# Patient Record
Sex: Female | Born: 1960 | Race: White | Hispanic: No | Smoking: Never smoker
Health system: Southern US, Community
[De-identification: ages and names within clinical notes are randomized; demographics above are authoritative.]

## PROBLEM LIST (undated history)

## (undated) DIAGNOSIS — Z923 Personal history of irradiation: Secondary | ICD-10-CM

## (undated) DIAGNOSIS — B019 Varicella without complication: Secondary | ICD-10-CM

## (undated) HISTORY — PX: APPENDECTOMY: SHX54

## (undated) HISTORY — PX: CHOLECYSTECTOMY: SHX55

## (undated) HISTORY — DX: Personal history of irradiation: Z92.3

---

## 2016-05-27 LAB — HM PAP SMEAR

## 2017-06-16 LAB — HM COLONOSCOPY

## 2020-05-13 DIAGNOSIS — M25461 Effusion, right knee: Secondary | ICD-10-CM | POA: Diagnosis not present

## 2020-05-13 DIAGNOSIS — M2241 Chondromalacia patellae, right knee: Secondary | ICD-10-CM | POA: Diagnosis not present

## 2020-10-18 ENCOUNTER — Emergency Department (HOSPITAL_COMMUNITY): Payer: BC Managed Care – PPO

## 2020-10-18 ENCOUNTER — Other Ambulatory Visit: Payer: Self-pay

## 2020-10-18 ENCOUNTER — Inpatient Hospital Stay (HOSPITAL_COMMUNITY)
Admission: EM | Admit: 2020-10-18 | Discharge: 2020-10-21 | DRG: 543 | Disposition: A | Discharge status: Home / Self Care | Payer: BC Managed Care – PPO | Attending: Family Medicine | Admitting: Family Medicine

## 2020-10-18 DIAGNOSIS — W109XXA Fall (on) (from) unspecified stairs and steps, initial encounter: Secondary | ICD-10-CM | POA: Diagnosis present

## 2020-10-18 DIAGNOSIS — R42 Dizziness and giddiness: Secondary | ICD-10-CM | POA: Diagnosis not present

## 2020-10-18 DIAGNOSIS — D509 Iron deficiency anemia, unspecified: Secondary | ICD-10-CM | POA: Diagnosis present

## 2020-10-18 DIAGNOSIS — Z9049 Acquired absence of other specified parts of digestive tract: Secondary | ICD-10-CM | POA: Diagnosis not present

## 2020-10-18 DIAGNOSIS — Z8249 Family history of ischemic heart disease and other diseases of the circulatory system: Secondary | ICD-10-CM

## 2020-10-18 DIAGNOSIS — C7951 Secondary malignant neoplasm of bone: Secondary | ICD-10-CM | POA: Diagnosis not present

## 2020-10-18 DIAGNOSIS — H5509 Other forms of nystagmus: Secondary | ICD-10-CM | POA: Diagnosis not present

## 2020-10-18 DIAGNOSIS — Y9301 Activity, walking, marching and hiking: Secondary | ICD-10-CM | POA: Diagnosis present

## 2020-10-18 DIAGNOSIS — M899 Disorder of bone, unspecified: Secondary | ICD-10-CM

## 2020-10-18 DIAGNOSIS — Z85038 Personal history of other malignant neoplasm of large intestine: Secondary | ICD-10-CM | POA: Diagnosis not present

## 2020-10-18 DIAGNOSIS — C7989 Secondary malignant neoplasm of other specified sites: Secondary | ICD-10-CM | POA: Diagnosis not present

## 2020-10-18 DIAGNOSIS — N83202 Unspecified ovarian cyst, left side: Secondary | ICD-10-CM | POA: Diagnosis not present

## 2020-10-18 DIAGNOSIS — I10 Essential (primary) hypertension: Secondary | ICD-10-CM | POA: Diagnosis not present

## 2020-10-18 DIAGNOSIS — M5136 Other intervertebral disc degeneration, lumbar region: Secondary | ICD-10-CM | POA: Diagnosis not present

## 2020-10-18 DIAGNOSIS — M25552 Pain in left hip: Secondary | ICD-10-CM | POA: Diagnosis not present

## 2020-10-18 DIAGNOSIS — H8302 Labyrinthitis, left ear: Secondary | ICD-10-CM | POA: Diagnosis not present

## 2020-10-18 DIAGNOSIS — Z7189 Other specified counseling: Secondary | ICD-10-CM

## 2020-10-18 DIAGNOSIS — N6310 Unspecified lump in the right breast, unspecified quadrant: Secondary | ICD-10-CM

## 2020-10-18 DIAGNOSIS — M4802 Spinal stenosis, cervical region: Secondary | ICD-10-CM | POA: Diagnosis not present

## 2020-10-18 DIAGNOSIS — N6341 Unspecified lump in right breast, subareolar: Secondary | ICD-10-CM | POA: Diagnosis not present

## 2020-10-18 DIAGNOSIS — Z20822 Contact with and (suspected) exposure to covid-19: Secondary | ICD-10-CM | POA: Diagnosis present

## 2020-10-18 DIAGNOSIS — E876 Hypokalemia: Secondary | ICD-10-CM | POA: Diagnosis not present

## 2020-10-18 DIAGNOSIS — Z853 Personal history of malignant neoplasm of breast: Secondary | ICD-10-CM | POA: Diagnosis not present

## 2020-10-18 DIAGNOSIS — H748X3 Other specified disorders of middle ear and mastoid, bilateral: Secondary | ICD-10-CM | POA: Diagnosis not present

## 2020-10-18 DIAGNOSIS — Z803 Family history of malignant neoplasm of breast: Secondary | ICD-10-CM

## 2020-10-18 DIAGNOSIS — H8309 Labyrinthitis, unspecified ear: Secondary | ICD-10-CM | POA: Diagnosis not present

## 2020-10-18 DIAGNOSIS — R9389 Abnormal findings on diagnostic imaging of other specified body structures: Secondary | ICD-10-CM

## 2020-10-18 DIAGNOSIS — J90 Pleural effusion, not elsewhere classified: Secondary | ICD-10-CM | POA: Diagnosis not present

## 2020-10-18 DIAGNOSIS — D72829 Elevated white blood cell count, unspecified: Secondary | ICD-10-CM | POA: Diagnosis present

## 2020-10-18 DIAGNOSIS — Y92009 Unspecified place in unspecified non-institutional (private) residence as the place of occurrence of the external cause: Secondary | ICD-10-CM | POA: Diagnosis not present

## 2020-10-18 DIAGNOSIS — I6782 Cerebral ischemia: Secondary | ICD-10-CM | POA: Diagnosis not present

## 2020-10-18 DIAGNOSIS — M5134 Other intervertebral disc degeneration, thoracic region: Secondary | ICD-10-CM | POA: Diagnosis not present

## 2020-10-18 DIAGNOSIS — M25551 Pain in right hip: Secondary | ICD-10-CM | POA: Diagnosis not present

## 2020-10-18 DIAGNOSIS — C50919 Malignant neoplasm of unspecified site of unspecified female breast: Secondary | ICD-10-CM | POA: Diagnosis not present

## 2020-10-18 DIAGNOSIS — R918 Other nonspecific abnormal finding of lung field: Secondary | ICD-10-CM | POA: Diagnosis not present

## 2020-10-18 DIAGNOSIS — F419 Anxiety disorder, unspecified: Secondary | ICD-10-CM | POA: Diagnosis not present

## 2020-10-18 DIAGNOSIS — G8929 Other chronic pain: Secondary | ICD-10-CM | POA: Diagnosis not present

## 2020-10-18 DIAGNOSIS — J3489 Other specified disorders of nose and nasal sinuses: Secondary | ICD-10-CM | POA: Diagnosis not present

## 2020-10-18 DIAGNOSIS — N63 Unspecified lump in unspecified breast: Secondary | ICD-10-CM | POA: Diagnosis not present

## 2020-10-18 DIAGNOSIS — Z800 Family history of malignant neoplasm of digestive organs: Secondary | ICD-10-CM | POA: Diagnosis not present

## 2020-10-18 DIAGNOSIS — M50222 Other cervical disc displacement at C5-C6 level: Secondary | ICD-10-CM | POA: Diagnosis not present

## 2020-10-18 DIAGNOSIS — R930 Abnormal findings on diagnostic imaging of skull and head, not elsewhere classified: Secondary | ICD-10-CM | POA: Diagnosis not present

## 2020-10-18 DIAGNOSIS — C7981 Secondary malignant neoplasm of breast: Secondary | ICD-10-CM

## 2020-10-18 DIAGNOSIS — R252 Cramp and spasm: Secondary | ICD-10-CM | POA: Diagnosis not present

## 2020-10-18 DIAGNOSIS — R0781 Pleurodynia: Secondary | ICD-10-CM | POA: Diagnosis not present

## 2020-10-18 DIAGNOSIS — Z79899 Other long term (current) drug therapy: Secondary | ICD-10-CM | POA: Diagnosis not present

## 2020-10-18 DIAGNOSIS — C799 Secondary malignant neoplasm of unspecified site: Secondary | ICD-10-CM | POA: Diagnosis present

## 2020-10-18 DIAGNOSIS — M7989 Other specified soft tissue disorders: Secondary | ICD-10-CM | POA: Diagnosis not present

## 2020-10-18 DIAGNOSIS — M50223 Other cervical disc displacement at C6-C7 level: Secondary | ICD-10-CM | POA: Diagnosis not present

## 2020-10-18 DIAGNOSIS — R609 Edema, unspecified: Secondary | ICD-10-CM | POA: Diagnosis not present

## 2020-10-18 HISTORY — DX: Varicella without complication: B01.9

## 2020-10-18 LAB — TROPONIN I (HIGH SENSITIVITY)
Troponin I (High Sensitivity): 6 ng/L (ref <18)
Troponin I (High Sensitivity): 7 ng/L (ref <18)

## 2020-10-18 LAB — URINALYSIS, ROUTINE W REFLEX MICROSCOPIC
Bilirubin Urine: NEGATIVE
Glucose, UA: NEGATIVE mg/dL
Hgb urine dipstick: NEGATIVE
Ketones, ur: 5 mg/dL — AB
Leukocytes,Ua: NEGATIVE
Nitrite: NEGATIVE
Protein, ur: 100 mg/dL — AB
Specific Gravity, Urine: 1.028 (ref 1.005–1.030)
pH: 5 (ref 5.0–8.0)

## 2020-10-18 LAB — CBC
HCT: 36.2 % (ref 36.0–46.0)
Hemoglobin: 10.9 g/dL — ABNORMAL LOW (ref 12.0–15.0)
MCH: 23.4 pg — ABNORMAL LOW (ref 26.0–34.0)
MCHC: 30.1 g/dL (ref 30.0–36.0)
MCV: 77.7 fL — ABNORMAL LOW (ref 80.0–100.0)
Platelets: 519 10*3/uL — ABNORMAL HIGH (ref 150–400)
RBC: 4.66 MIL/uL (ref 3.87–5.11)
RDW: 16 % — ABNORMAL HIGH (ref 11.5–15.5)
WBC: 15.9 10*3/uL — ABNORMAL HIGH (ref 4.0–10.5)
nRBC: 0.1 % (ref 0.0–0.2)

## 2020-10-18 LAB — BASIC METABOLIC PANEL
Anion gap: 14 (ref 5–15)
BUN: 14 mg/dL (ref 6–20)
CO2: 26 mmol/L (ref 22–32)
Calcium: 10.7 mg/dL — ABNORMAL HIGH (ref 8.9–10.3)
Chloride: 100 mmol/L (ref 98–111)
Creatinine, Ser: 0.94 mg/dL (ref 0.44–1.00)
GFR, Estimated: >60 mL/min (ref >60)
Glucose, Bld: 122 mg/dL — ABNORMAL HIGH (ref 70–99)
Potassium: 3.4 mmol/L — ABNORMAL LOW (ref 3.5–5.1)
Sodium: 140 mmol/L (ref 135–145)

## 2020-10-18 LAB — RESP PANEL BY RT-PCR (FLU A&B, COVID) ARPGX2
Influenza A by PCR: NEGATIVE
Influenza B by PCR: NEGATIVE
SARS Coronavirus 2 by RT PCR: NEGATIVE

## 2020-10-18 LAB — I-STAT BETA HCG BLOOD, ED (MC, WL, AP ONLY): I-stat hCG, quantitative: <5 m[IU]/mL (ref <5)

## 2020-10-18 MED ORDER — SODIUM CHLORIDE 0.9% FLUSH
3.0000 mL | Freq: Two times a day (BID) | INTRAVENOUS | Status: DC
  Administered 2020-10-19 – 2020-10-21 (×4): 3 mL via INTRAVENOUS

## 2020-10-18 MED ORDER — LORATADINE 10 MG PO TABS
10.0000 mg | ORAL_TABLET | Freq: Two times a day (BID) | ORAL | Status: DC
  Administered 2020-10-19 – 2020-10-21 (×5): 10 mg via ORAL
  Filled 2020-10-18 (×6): qty 1

## 2020-10-18 MED ORDER — ACETAMINOPHEN 325 MG PO TABS
650.0000 mg | ORAL_TABLET | Freq: Four times a day (QID) | ORAL | Status: DC | PRN
  Administered 2020-10-19 – 2020-10-20 (×4): 650 mg via ORAL
  Filled 2020-10-18 (×4): qty 2

## 2020-10-18 MED ORDER — ENOXAPARIN SODIUM 60 MG/0.6ML ~~LOC~~ SOLN
55.0000 mg | Freq: Every day | SUBCUTANEOUS | Status: DC
  Administered 2020-10-19 – 2020-10-20 (×3): 55 mg via SUBCUTANEOUS
  Filled 2020-10-18 (×3): qty 0.6
  Filled 2020-10-18: qty 0.55

## 2020-10-18 MED ORDER — ZOLPIDEM TARTRATE 5 MG PO TABS: 5.0000 mg | ORAL_TABLET | Freq: Every evening | ORAL | Status: DC | PRN

## 2020-10-18 MED ORDER — KETOROLAC TROMETHAMINE 30 MG/ML IJ SOLN
30.0000 mg | Freq: Four times a day (QID) | INTRAMUSCULAR | Status: DC | PRN
  Administered 2020-10-19: 30 mg via INTRAVENOUS
  Filled 2020-10-18 (×2): qty 1

## 2020-10-18 MED ORDER — ACETAMINOPHEN 650 MG RE SUPP: 650.0000 mg | Freq: Four times a day (QID) | RECTAL | Status: DC | PRN

## 2020-10-18 MED ORDER — LORAZEPAM 2 MG/ML IJ SOLN
0.5000 mg | Freq: Once | INTRAMUSCULAR | Status: AC
  Administered 2020-10-18: 0.5 mg via INTRAVENOUS
  Filled 2020-10-18: qty 1

## 2020-10-18 MED ORDER — ONDANSETRON HCL 4 MG/2ML IJ SOLN
4.0000 mg | Freq: Once | INTRAMUSCULAR | Status: AC
  Administered 2020-10-18: 4 mg via INTRAVENOUS
  Filled 2020-10-18: qty 2

## 2020-10-18 MED ORDER — FENTANYL CITRATE (PF) 100 MCG/2ML IJ SOLN
50.0000 ug | Freq: Once | INTRAMUSCULAR | Status: AC
  Administered 2020-10-18: 50 ug via INTRAVENOUS
  Filled 2020-10-18: qty 2

## 2020-10-18 MED ORDER — MECLIZINE HCL 25 MG PO TABS
25.0000 mg | ORAL_TABLET | Freq: Once | ORAL | Status: AC
  Administered 2020-10-18: 25 mg via ORAL
  Filled 2020-10-18: qty 1

## 2020-10-18 MED ORDER — SODIUM CHLORIDE 0.9% FLUSH: 3.0000 mL | INTRAVENOUS | Status: DC | PRN

## 2020-10-18 MED ORDER — MECLIZINE HCL 25 MG PO TABS
25.0000 mg | ORAL_TABLET | Freq: Three times a day (TID) | ORAL | Status: DC
  Administered 2020-10-19 – 2020-10-21 (×8): 25 mg via ORAL
  Filled 2020-10-18 (×9): qty 1

## 2020-10-18 MED ORDER — SODIUM CHLORIDE 0.9 % IV SOLN
250.0000 mL | INTRAVENOUS | Status: DC | PRN
Start: 2020-10-18 — End: 2020-10-22

## 2020-10-18 MED ORDER — SODIUM CHLORIDE 0.9 % IV BOLUS
1000.0000 mL | Freq: Once | INTRAVENOUS | Status: AC
  Administered 2020-10-18: 1000 mL via INTRAVENOUS

## 2020-10-18 MED ORDER — MORPHINE SULFATE (PF) 4 MG/ML IV SOLN
4.0000 mg | Freq: Once | INTRAVENOUS | Status: AC
  Administered 2020-10-18: 4 mg via INTRAVENOUS
  Filled 2020-10-18: qty 1

## 2020-10-18 NOTE — H&P (Signed)
History and Physical    Annette James ZOX:[id number] DOB: [birth year] DOA: 10/18/2020  PCP: Patient, No Pcp Per (Confirm with patient/family/NH records and if not entered, this has to be entered at [facility] point of entry) Patient coming from: home  I have personally briefly reviewed patient's old medical records in [place]  Chief Complaint: fall, dizziness  HPI: Annette James is a 60 y.o. female with no significant medical history who presents for evaluation of fall, dizziness.  She reports that she fell this morning.  She states she was walking down the steps and fell about 3 steps.  She does not know if she hit her head but did not have any LOC.  She reports that she has been having issues with her left knee and she thought her knee gave out which is what caused her to fall.  She cannot recall if she was dizzy before the fall.  She states that the first time she really remembers that when she bent down to put her shoe on and when she sat back up, she was very dizzy, felt nauseous.  She has had dizziness since then.  She states it is better if she remains still but as soon as she moves her head, sits up, the dizziness returns.  She states she did not have any preceding chest pain.  She states that she has pain noted to her right side of her back, shoulders, hips.  She has been able to ambulate since this happened.  She is not on blood thinners.  She denies any vision changes, numbness/weakness of her arms or legs.  She denies any chest pain, difficulty breathing, abdominal pain.  ED Course: T 98.9  142/84  HR 89  RR 14 ED-PA exam notable for horizontal nystagmus, positional dizziness. Lab with Covid NEGATIVE, Cmet with glucose 122. WBC 15.9, Hgb 10.9. CT head w/o acute abnormality. CT C-spine with multiple lytic lesions. TRH called to admit for further evaluation of lytic spine lesions - r/o malignancy.  Review of Systems: As per HPI otherwise 10 point review of systems negative.    Past  Medical History:  Diagnosis Date  . Chicken pox     Past Surgical History:  Procedure Laterality Date  . APPENDECTOMY    . CESAREAN SECTION     x 4  . CHOLECYSTECTOMY     Soc Hx  - married 51 years, widowed 2014. 4 daughters - two still at home. I grandchild, one on the way. 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 Two daughters live at home.    has no history on file for tobacco use, alcohol use, and drug use.  No Known Allergies  Family History  Problem Relation Age of Onset  . Breast cancer Mother   . Hypertension Mother   . Colon cancer Father   . Hypertension Father      Prior to Admission medications   Medication Sig Start Date End Date Taking? Authorizing Provider  ascorbic acid (VITAMIN C) 500 MG tablet Take 500 mg by mouth daily.   Yes [provider]  Multiple Vitamins-Minerals (ONE-A-DAY WOMENS 50 PLUS) TABS Take 1 tablet by mouth daily.   Yes [provider]    Physical Exam: Vitals:   10/18/20 2240 10/18/20 2245 10/18/20 2300 10/18/20 2315  BP: (!) 151/86 (!) 158/80 (!) 142/84   Pulse: 99 94 90 89  Resp: (!) 21 (!) 23 (!) 23 14  Temp: 98.9 F (37.2 C)     TempSrc:  Oral     SpO2: 96% 91% 91% 91%  Weight:      Height:         Vitals:   10/18/20 2240 10/18/20 2245 10/18/20 2300 10/18/20 2315  BP: (!) 151/86 (!) 158/80 (!) 142/84   Pulse: 99 94 90 89  Resp: (!) 21 (!) 23 (!) 23 14  Temp: 98.9 F (37.2 C)     TempSrc: Oral     SpO2: 96% 91% 91% 91%  Weight:      Height:       General- overweight woman in no distress Eyes: PERRL, lids and conjunctivae normal ENMT: Mucous membranes are moist. Posterior pharynx clear of any exudate or lesions.Normal dentition.  Neck: normal, supple, no masses, no thyromegaly Breast - 3x4 cm firm mobile mass right breast at 10 O'clock position. Respiratory: clear to auscultation bilaterally, no wheezing, no crackles. Normal respiratory effort. No accessory muscle use.  Cardiovascular: Regular  rate and rhythm, no murmurs / rubs / gallops. No extremity edema. 2+ pedal pulses. No carotid bruits.  Abdomen: no tenderness, no masses palpated. No hepatosplenomegaly. Bowel sounds positive.  Musculoskeletal: no clubbing / cyanosis. No joint deformity upper and lower extremities. Good ROM, no contractures. Normal muscle tone.  Skin: no rashes, lesions, ulcers. No induration Neurologic: CN 2-12 grossly intact. Minimal horizontal nystagmus with head turn to the left. Sensation intact. Strength 5/5 in all 4.  Psychiatric: Normal judgment and insight. Alert and oriented x 3. Normal mood.     Labs on Admission: I have personally reviewed following labs and imaging studies  CBC: Recent Labs  Lab 10/18/20 0819  WBC 15.9*  HGB 10.9*  HCT 36.2  MCV 77.7*  PLT 893*   Basic Metabolic Panel: Recent Labs  Lab 10/18/20 0819  NA 140  K 3.4*  CL 100  CO2 26  GLUCOSE 122*  BUN 14  CREATININE 0.94  CALCIUM 10.7*   GFR: Estimated Creatinine Clearance: 85.4 mL/min (by C-G formula based on SCr of 0.94 mg/dL). Liver Function Tests: No results for input(s): AST, ALT, ALKPHOS, BILITOT, PROT, ALBUMIN in the last 168 hours. No results for input(s): LIPASE, AMYLASE in the last 168 hours. No results for input(s): AMMONIA in the last 168 hours. Coagulation Profile: No results for input(s): INR, PROTIME in the last 168 hours. Cardiac Enzymes: No results for input(s): CKTOTAL, CKMB, CKMBINDEX, TROPONINI in the last 168 hours. BNP (last 3 results) No results for input(s): PROBNP in the last 8760 hours. HbA1C: No results for input(s): HGBA1C in the last 72 hours. CBG: No results for input(s): GLUCAP in the last 168 hours. Lipid Profile: No results for input(s): CHOL, HDL, LDLCALC, TRIG, CHOLHDL, LDLDIRECT in the last 72 hours. Thyroid Function Tests: No results for input(s): TSH, T4TOTAL, FREET4, T3FREE, THYROIDAB in the last 72 hours. Anemia Panel: No results for input(s): VITAMINB12,  FOLATE, FERRITIN, TIBC, IRON, RETICCTPCT in the last 72 hours. Urine analysis:    Component Value Date/Time   COLORURINE AMBER (A) 10/18/2020 0819   APPEARANCEUR CLOUDY (A) 10/18/2020 0819   LABSPEC 1.028 10/18/2020 0819   PHURINE 5.0 10/18/2020 0819   GLUCOSEU NEGATIVE 10/18/2020 0819   HGBUR NEGATIVE 10/18/2020 0819   BILIRUBINUR NEGATIVE 10/18/2020 0819   KETONESUR 5 (A) 10/18/2020 0819   PROTEINUR 100 (A) 10/18/2020 0819   NITRITE NEGATIVE 10/18/2020 0819   LEUKOCYTESUR NEGATIVE 10/18/2020 0819    Radiological Exams on Admission: DG Ribs Unilateral W/Chest Right  Result Date: 10/18/2020 CLINICAL DATA:  Pain after  fall. EXAM: RIGHT RIBS AND CHEST - 3+ VIEW COMPARISON:  None. FINDINGS: No fracture or other bone lesions are seen involving the ribs. There is no evidence of pneumothorax or pleural effusion. Both lungs are clear. Heart size and mediastinal contours are within normal limits. IMPRESSION: Negative. Electronically Signed   By: Dorise Bullion III M.D   On: 10/18/2020 19:02   CT Head Wo Contrast  Result Date: 10/18/2020 CLINICAL DATA:  Status post fall, dizziness. Discussion with PA, no known malignancy. EXAM: CT HEAD WITHOUT CONTRAST CT CERVICAL SPINE WITHOUT CONTRAST TECHNIQUE: Multidetector CT imaging of the head and cervical spine was performed following the standard protocol without intravenous contrast. Multiplanar CT image reconstructions of the cervical spine were also generated. COMPARISON:  None. FINDINGS: CT HEAD FINDINGS Brain: No evidence of large-territorial acute infarction. No parenchymal hemorrhage. No mass lesion. No extra-axial collection. No mass effect or midline shift. No hydrocephalus. Basilar cisterns are patent. Vascular: No hyperdense vessel. Skull: Suggestion of several calvarial lytic lesions. No acute fracture or focal lesion. Sinuses/Orbits: Paranasal sinuses and mastoid air cells are clear. The orbits are unremarkable. Other: None. CT CERVICAL SPINE  FINDINGS Alignment: Normal. Skull base and vertebrae: No acute fracture. Innumerable lytic lesions of the cervical spine involving the vertebral bodies as well as posterior elements. Degenerative changes of the spine and the C5-C6 level. Soft tissues and spinal canal: No prevertebral fluid or swelling. No visible canal hematoma. Disc levels:  Maintained. Upper chest: Unremarkable. Other: None. IMPRESSION: 1. 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 No definite associated pathologic fracture of the cervical spine. Recommend workup for malignancy. 2. No acute intracranial abnormality. Please note markedly limited evaluation for intracranial metastases. Consider MRI brain with and without contrast for further evaluation given above findings. 3. No acute displaced fracture or traumatic listhesis of the cervical spine. These results were called by telephone at the time of interpretation on 10/18/2020 at 7:26 pm to provider PA [facility] LAYDEN , who verbally acknowledged these results. Electronically Signed   By: Iven Finn M.D.   On: 10/18/2020 19:31   CT Cervical Spine Wo Contrast  Result Date: 10/18/2020 CLINICAL DATA:  Status post fall, dizziness. Discussion with PA, no known malignancy. EXAM: CT HEAD WITHOUT CONTRAST CT CERVICAL SPINE WITHOUT CONTRAST TECHNIQUE: Multidetector CT imaging of the head and cervical spine was performed following the standard protocol without intravenous contrast. Multiplanar CT image reconstructions of the cervical spine were also generated. COMPARISON:  None. FINDINGS: CT HEAD FINDINGS Brain: No evidence of large-territorial acute infarction. No parenchymal hemorrhage. No mass lesion. No extra-axial collection. No mass effect or midline shift. No hydrocephalus. Basilar cisterns are patent. Vascular: No hyperdense vessel. Skull: Suggestion of several calvarial lytic lesions. No acute fracture or focal lesion. Sinuses/Orbits: Paranasal sinuses and mastoid  air cells are clear. The orbits are unremarkable. Other: None. CT CERVICAL SPINE FINDINGS Alignment: Normal. Skull base and vertebrae: No acute fracture. Innumerable lytic lesions of the cervical spine involving the vertebral bodies as well as posterior elements. Degenerative changes of the spine and the C5-C6 level. Soft tissues and spinal canal: No prevertebral fluid or swelling. No visible canal hematoma. Disc levels:  Maintained. Upper chest: Unremarkable. Other: None. IMPRESSION: 1. 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 No definite associated pathologic fracture of the cervical spine. Recommend workup for malignancy. 2. No acute intracranial abnormality. Please note markedly limited evaluation for intracranial metastases. Consider MRI brain with and without contrast for further evaluation given above findings. 3.  No acute displaced fracture or traumatic listhesis of the cervical spine. These results were called by telephone at the time of interpretation on 10/18/2020 at 7:26 pm to provider PA [facility] LAYDEN , who verbally acknowledged these results. Electronically Signed   By: Iven Finn M.D.   On: 10/18/2020 19:31   DG Hip Unilat With Pelvis 2-3 Views Left  Result Date: 10/18/2020 CLINICAL DATA:  [place] down steps this morning.  Bilateral hip pain. EXAM: DG HIP (WITH OR WITHOUT PELVIS) 2-3V LEFT COMPARISON:  None. FINDINGS: No fracture. Subtle lucencies are noted on the AP pelvis view, which are not well-defined and some of which are overlying bowel gas. No definite osteolytic lesions and no evidence of an osteoblastic lesion. Right SI joint is narrowed with bordering sclerosis. The left SI joint, both hip joints and the pubic symphysis are normally spaced and aligned with no significant arthropathic change. Soft tissues are unremarkable. IMPRESSION: 1. No fracture or acute finding. 2. Right SI joint degenerative/arthropathic change. Electronically Signed   By: Lajean Manes M.D.   On: 10/18/2020 08:46   DG Hip Unilat  With Pelvis 2-3 Views Right  Result Date: 10/18/2020 CLINICAL DATA:  [place] down steps this morning.  Bilateral hip pain. EXAM: DG HIP (WITH OR WITHOUT PELVIS) 2-3V RIGHT COMPARISON:  None. FINDINGS: No fracture. Patchy sclerosis is noted along the right SI joint. SI joint is normally aligned but mildly narrowed. Right hip joint is normally spaced and aligned. SI joint is normally aligned. Soft tissues are unremarkable. IMPRESSION: 1. No fracture or dislocation. 2. Right SI joint arthropathic changes. Well maintained right hip joint. Electronically Signed   By: Lajean Manes M.D.   On: 10/18/2020 08:43    EKG: Independently reviewed. Sinus Tachycardia  Assessment/Plan Active Problems:   Lytic bone lesions on xray   Breast mass, right   Labyrinthitis     1. Breast mass right - patient with family h/o breast cancer. Has breast mass 3 x 4 cm on exam. Has lytic lesion on CT C-spine. Plan MRI right breast with contrast.  Oncology consult 2. Lytic lesions C-spine - worrisome for metastatic disease. Radiology recommends MRI brain. Plan  See #1  MRI brain  Oncology consult  3. Leukocytosis - WBC 15.9 w/o evidence of infection. Plan Differential ordered  R/o hematologic disease - lymphome, leukemia  ONcology consult  4. Labyrinthitis - patient with dizziness with head movement. Non-focal neuro exam. Plan antiver 25 mg tid  H1 blocker BID  DVT prophylaxis: lovenox  Code Status: full code  Family Communication: patient prefers daughters not be called. They were informed by ED staff of need for admission. Unaware of breast lump  Disposition Plan: HOme when stable  Consults called: oncology - Dr. Marin Olp notified and will see patient 10/19/20 (with names) Admission status: observation    Adella Hare MD [facility] Pager [phone]  If 7PM-7AM, please contact night-coverage [email or web address] Password [facility]  10/19/2020, 12:16 AM

## 2020-10-18 NOTE — ED Provider Notes (Signed)
[facility] EMERGENCY DEPARTMENT Provider Note   CSN: [id number] Arrival date & time: 10/18/20  0805     History Chief Complaint  Patient presents with  . Dizziness  . Fall    Annette James is a 60 y.o. female who presents for evaluation of fall, dizziness.  She reports that she fell this morning.  She states she was walking down the steps and fell about 3 steps.  She does not know if she hit her head but did not have any LOC.  She reports that she has been having issues with her left knee and she thought her knee gave out which is what caused her to fall.  She cannot recall if she was dizzy before the fall.  She states that the first time she really remembers that when she bent down to put her shoe on and when she sat back up, she was very dizzy, felt nauseous.  She has had dizziness since then.  She states it is better if she remains still but as soon as she moves her head, sits up, the dizziness returns.  She states she did not have any preceding chest pain.  She states that she has pain noted to her right side of her back, shoulders, hips.  She has been able to ambulate since this happened.  She is not on blood thinners.  She denies any vision changes, numbness/weakness of her arms or legs.  She denies any chest pain, difficulty breathing, abdominal pain.  The history is provided by the patient.       No past medical history on file.  There are no problems to display for this patient.    The histories are not reviewed yet. Please review them in the "History" navigator section and refresh this [place].   OB History   No obstetric history on file.     No family history on file.     Home Medications Prior to Admission medications   Medication Sig Start Date End Date Taking? Authorizing Provider  ascorbic acid (VITAMIN C) 500 MG tablet Take 500 mg by mouth daily.   Yes [provider]  Multiple Vitamins-Minerals (ONE-A-DAY WOMENS 50 PLUS) TABS  Take 1 tablet by mouth daily.   Yes [provider]    Allergies    Patient has no known allergies.  Review of Systems   Review of Systems  Constitutional: Negative for fever.  Respiratory: Negative for cough and shortness of breath.   Cardiovascular: Negative for chest pain.  Gastrointestinal: Positive for nausea. Negative for abdominal pain and vomiting.  Genitourinary: Negative for dysuria and hematuria.  Musculoskeletal: Positive for back pain.  Neurological: Positive for dizziness. Negative for weakness, numbness and headaches.  All other systems reviewed and are negative.   Physical Exam Updated Vital Signs BP (!) 163/82   Pulse 96   Temp 98.5 F (36.9 C) (Oral)   Resp (!) 21   Ht 5\' 8"  (1.727 m)   Wt 108.9 kg   SpO2 91%   BMI 36.49 kg/m   Physical Exam Vitals and nursing note reviewed.  Constitutional:      Appearance: Normal appearance. She is well-developed and well-nourished.  HENT:     Head: Normocephalic and atraumatic.     Comments: No tenderness to palpation of skull. No deformities or crepitus noted. No open wounds, abrasions or lacerations.     Mouth/Throat:     Mouth: Oropharynx is clear and moist and mucous membranes are normal.  Eyes:     General: Lids are normal.     Extraocular Movements:     Right eye: Nystagmus present.     Left eye: Nystagmus present.     Conjunctiva/sclera: Conjunctivae normal.     Pupils: Pupils are equal, round, and reactive to light.     Comments: Horizontal nystagmus noted to the left.  No vertical or rotational nystagmus. PERRL.   Cardiovascular:     Rate and Rhythm: Normal rate and regular rhythm.     Pulses: Normal pulses.     Heart sounds: Normal heart sounds. No murmur heard. No friction rub. No gallop.   Pulmonary:     Effort: Pulmonary effort is normal.     Breath sounds: Normal breath sounds.  Abdominal:     Palpations: Abdomen is soft. Abdomen is not rigid.     Tenderness: There is no abdominal  tenderness. There is no guarding.  Musculoskeletal:        General: Normal range of motion.     Cervical back: Full passive range of motion without pain.       Back:     Comments: No midline T or L-spine tenderness.  Tenderness palpation to the mid thoracic region of the right paraspinal muscles.  Skin:    General: Skin is warm and dry.     Capillary Refill: Capillary refill takes less than 2 seconds.  Neurological:     Mental Status: She is alert and oriented to person, place, and time.     Comments: Cranial nerves III-XII intact Follows commands, Moves all extremities  5/5 strength to BUE and BLE  Sensation intact throughout all major nerve distributions No slurred speech. No facial droop.   Psychiatric:        Mood and Affect: Mood and affect normal.        Speech: Speech normal.     ED Results / Procedures / Treatments   Labs (all labs ordered are listed, but only abnormal results are displayed) Labs Reviewed  BASIC METABOLIC PANEL - Abnormal; Notable for the following components:      Result Value   Potassium 3.4 (*)    Glucose, Bld 122 (*)    Calcium 10.7 (*)    All other components within normal limits  CBC - Abnormal; Notable for the following components:   WBC 15.9 (*)    Hemoglobin 10.9 (*)    MCV 77.7 (*)    MCH 23.4 (*)    RDW 16.0 (*)    Platelets 519 (*)    All other components within normal limits  URINALYSIS, ROUTINE W REFLEX MICROSCOPIC - Abnormal; Notable for the following components:   Color, Urine AMBER (*)    APPearance CLOUDY (*)    Ketones, ur 5 (*)    Protein, ur 100 (*)    Bacteria, UA RARE (*)    All other components within normal limits  RESP PANEL BY RT-PCR (FLU A&B, COVID) ARPGX2  CBG MONITORING, ED  I-STAT BETA HCG BLOOD, ED ([facility], [facility], [facility] ONLY)  TROPONIN I (HIGH SENSITIVITY)  TROPONIN I (HIGH SENSITIVITY)    EKG None  Radiology DG Ribs Unilateral W/Chest Right  Result Date: 10/18/2020 CLINICAL DATA:  Pain after fall. EXAM: RIGHT  RIBS AND CHEST - 3+ VIEW COMPARISON:  None. FINDINGS: No fracture or other bone lesions are seen involving the ribs. There is no evidence of pneumothorax or pleural effusion. Both lungs are clear. Heart size and mediastinal contours are within normal limits.  IMPRESSION: Negative. Electronically Signed   By: Dorise Bullion III M.D   On: 10/18/2020 19:02   CT Head Wo Contrast  Result Date: 10/18/2020 CLINICAL DATA:  Status post fall, dizziness. Discussion with PA, no known malignancy. EXAM: CT HEAD WITHOUT CONTRAST CT CERVICAL SPINE WITHOUT CONTRAST TECHNIQUE: Multidetector CT imaging of the head and cervical spine was performed following the standard protocol without intravenous contrast. Multiplanar CT image reconstructions of the cervical spine were also generated. COMPARISON:  None. FINDINGS: CT HEAD FINDINGS Brain: No evidence of large-territorial acute infarction. No parenchymal hemorrhage. No mass lesion. No extra-axial collection. No mass effect or midline shift. No hydrocephalus. Basilar cisterns are patent. Vascular: No hyperdense vessel. Skull: Suggestion of several calvarial lytic lesions. No acute fracture or focal lesion. Sinuses/Orbits: Paranasal sinuses and mastoid air cells are clear. The orbits are unremarkable. Other: None. CT CERVICAL SPINE FINDINGS Alignment: Normal. Skull base and vertebrae: No acute fracture. Innumerable lytic lesions of the cervical spine involving the vertebral bodies as well as posterior elements. Degenerative changes of the spine and the C5-C6 level. Soft tissues and spinal canal: No prevertebral fluid or swelling. No visible canal hematoma. Disc levels:  Maintained. Upper chest: Unremarkable. Other: None. IMPRESSION: 1. 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 No definite associated pathologic fracture of the cervical spine. Recommend workup for malignancy. 2. No acute intracranial abnormality. Please note markedly limited evaluation for  intracranial metastases. Consider MRI brain with and without contrast for further evaluation given above findings. 3. No acute displaced fracture or traumatic listhesis of the cervical spine. These results were called by telephone at the time of interpretation on 10/18/2020 at 7:26 pm to provider PA [facility] Letetia Romanello , who verbally acknowledged these results. Electronically Signed   By: Iven Finn M.D.   On: 10/18/2020 19:31   CT Cervical Spine Wo Contrast  Result Date: 10/18/2020 CLINICAL DATA:  Status post fall, dizziness. Discussion with PA, no known malignancy. EXAM: CT HEAD WITHOUT CONTRAST CT CERVICAL SPINE WITHOUT CONTRAST TECHNIQUE: Multidetector CT imaging of the head and cervical spine was performed following the standard protocol without intravenous contrast. Multiplanar CT image reconstructions of the cervical spine were also generated. COMPARISON:  None. FINDINGS: CT HEAD FINDINGS Brain: No evidence of large-territorial acute infarction. No parenchymal hemorrhage. No mass lesion. No extra-axial collection. No mass effect or midline shift. No hydrocephalus. Basilar cisterns are patent. Vascular: No hyperdense vessel. Skull: Suggestion of several calvarial lytic lesions. No acute fracture or focal lesion. Sinuses/Orbits: Paranasal sinuses and mastoid air cells are clear. The orbits are unremarkable. Other: None. CT CERVICAL SPINE FINDINGS Alignment: Normal. Skull base and vertebrae: No acute fracture. Innumerable lytic lesions of the cervical spine involving the vertebral bodies as well as posterior elements. Degenerative changes of the spine and the C5-C6 level. Soft tissues and spinal canal: No prevertebral fluid or swelling. No visible canal hematoma. Disc levels:  Maintained. Upper chest: Unremarkable. Other: None. IMPRESSION: 1. 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 No definite associated pathologic fracture of the cervical spine. Recommend workup for malignancy.  2. No acute intracranial abnormality. Please note markedly limited evaluation for intracranial metastases. Consider MRI brain with and without contrast for further evaluation given above findings. 3. No acute displaced fracture or traumatic listhesis of the cervical spine. These results were called by telephone at the time of interpretation on 10/18/2020 at 7:26 pm to provider PA [facility] Allyana Vogan , who verbally acknowledged these results. Electronically Signed   By: Iven Finn  M.D.   On: 10/18/2020 19:31   DG Hip Unilat With Pelvis 2-3 Views Left  Result Date: 10/18/2020 CLINICAL DATA:  [place] down steps this morning.  Bilateral hip pain. EXAM: DG HIP (WITH OR WITHOUT PELVIS) 2-3V LEFT COMPARISON:  None. FINDINGS: No fracture. Subtle lucencies are noted on the AP pelvis view, which are not well-defined and some of which are overlying bowel gas. No definite osteolytic lesions and no evidence of an osteoblastic lesion. Right SI joint is narrowed with bordering sclerosis. The left SI joint, both hip joints and the pubic symphysis are normally spaced and aligned with no significant arthropathic change. Soft tissues are unremarkable. IMPRESSION: 1. No fracture or acute finding. 2. Right SI joint degenerative/arthropathic change. Electronically Signed   By: Lajean Manes M.D.   On: 10/18/2020 08:46   DG Hip Unilat  With Pelvis 2-3 Views Right  Result Date: 10/18/2020 CLINICAL DATA:  [place] down steps this morning.  Bilateral hip pain. EXAM: DG HIP (WITH OR WITHOUT PELVIS) 2-3V RIGHT COMPARISON:  None. FINDINGS: No fracture. Patchy sclerosis is noted along the right SI joint. SI joint is normally aligned but mildly narrowed. Right hip joint is normally spaced and aligned. SI joint is normally aligned. Soft tissues are unremarkable. IMPRESSION: 1. No fracture or dislocation. 2. Right SI joint arthropathic changes. Well maintained right hip joint. Electronically Signed   By: Lajean Manes M.D.   On: 10/18/2020 08:43     Procedures Procedures (including critical care time)  Medications Ordered in ED Medications  sodium chloride 0.9 % bolus 1,000 mL (0 mLs Intravenous Stopped 10/18/20 2153)  ondansetron (ZOFRAN) injection 4 mg (4 mg Intravenous Given 10/18/20 1814)  morphine 4 MG/ML injection 4 mg (4 mg Intravenous Given 10/18/20 1814)  LORazepam (ATIVAN) injection 0.5 mg (0.5 mg Intravenous Given 10/18/20 1818)  meclizine (ANTIVERT) tablet 25 mg (25 mg Oral Given 10/18/20 1927)    ED Course  I have reviewed the triage vital signs and the nursing notes.  Pertinent labs & imaging results that were available during my care of the patient were reviewed by me and considered in my medical decision making (see chart for details).    MDM Rules/Calculators/A&P                          60 year old female who presents for evaluation of fall.  Also reports dizziness.  She is unsure if she had dizziness that caused her to fall.  She states that the dizziness started when she was bending down to touch her shoe and states that after that, it has been persistent.  She has had associated nausea.  No vomiting.  She is not on blood thinners.  Does not t know if she hit her head but did not have any LOC.  She is on blood thinners.  On initial arrival, she is afebrile, she appears uncomfortable and is slightly diaphoretic.  She has horizontal nystagmus noted on the left.  Suspect that this is most likely vertigo.  I will check head CT given trauma, give fluids, antiemetics.  Additionally, we will plan to check EKG, chest x-ray.  I separated negative.  BMP shows potassium 3.4.  Normal BUN and creatinine.  UA negative for any infectious etiology.  CBC shows slight leukocytosis of 15.9.  Bilateral hip XRs negative for any acute fracture. Rib XRs negative for any acute abnormality.   CT head and neck show interval axial and appendicular lytic lesion suggesting  metastasis.  No definitive associated pathologic fracture of the cervical  spine.  No acute intracranial abnormality.  Discussed patient with Dr. Linda Hedges (hospitalist). He would like me to consult oncology to see if patient can be evaluated.   Discussed patient with Dr. Marin Olp (Oncology). He agrees with plan for admission and will plan to consult.   Updated Dr. Linda Hedges (hospitalist) on plan.   Portions of this note were generated with [employer]. Dictation errors may occur despite best attempts at proofreading.   Final Clinical Impression(s) / ED Diagnoses Final diagnoses:  Dizziness  Abnormal CT scan, neck    Rx / DC Orders ED Discharge Orders         Ordered    Consult to oncology  Status:  Canceled       Provider:  (Not yet assigned)   10/18/20 2056           Volanda Napoleon, PA-C 10/18/20 2200    Charlesetta Shanks, MD 10/19/20 0004

## 2020-10-18 NOTE — ED Notes (Addendum)
Dr. Norins at bedside 

## 2020-10-18 NOTE — ED Triage Notes (Signed)
Pt here with reports of falling down 3 steps. States she has problems with her knee giving out and thought that is what caused the fall. States she is now dizzy and unsure if the dizziness is what caused the fall. Denies blood thinners, denies LOC. Pt c/o pain to bil shoulders, back and bil hips.

## 2020-10-18 NOTE — ED Notes (Signed)
Pt transported to xray 

## 2020-10-18 NOTE — ED Notes (Signed)
Debbie (Daughter#[phone]) called for update on patient's status.  Thank you

## 2020-10-19 ENCOUNTER — Observation Stay (HOSPITAL_COMMUNITY): Payer: BC Managed Care – PPO

## 2020-10-19 ENCOUNTER — Inpatient Hospital Stay (HOSPITAL_COMMUNITY): Payer: BC Managed Care – PPO

## 2020-10-19 ENCOUNTER — Encounter (HOSPITAL_COMMUNITY): Payer: Self-pay | Admitting: Internal Medicine

## 2020-10-19 DIAGNOSIS — Y92009 Unspecified place in unspecified non-institutional (private) residence as the place of occurrence of the external cause: Secondary | ICD-10-CM | POA: Diagnosis not present

## 2020-10-19 DIAGNOSIS — Z85038 Personal history of other malignant neoplasm of large intestine: Secondary | ICD-10-CM | POA: Diagnosis not present

## 2020-10-19 DIAGNOSIS — M4802 Spinal stenosis, cervical region: Secondary | ICD-10-CM | POA: Diagnosis not present

## 2020-10-19 DIAGNOSIS — W109XXA Fall (on) (from) unspecified stairs and steps, initial encounter: Secondary | ICD-10-CM | POA: Diagnosis present

## 2020-10-19 DIAGNOSIS — N6341 Unspecified lump in right breast, subareolar: Secondary | ICD-10-CM | POA: Diagnosis not present

## 2020-10-19 DIAGNOSIS — H8309 Labyrinthitis, unspecified ear: Secondary | ICD-10-CM | POA: Diagnosis present

## 2020-10-19 DIAGNOSIS — N63 Unspecified lump in unspecified breast: Secondary | ICD-10-CM | POA: Diagnosis not present

## 2020-10-19 DIAGNOSIS — M899 Disorder of bone, unspecified: Secondary | ICD-10-CM | POA: Diagnosis not present

## 2020-10-19 DIAGNOSIS — N6310 Unspecified lump in the right breast, unspecified quadrant: Secondary | ICD-10-CM | POA: Diagnosis present

## 2020-10-19 DIAGNOSIS — Z853 Personal history of malignant neoplasm of breast: Secondary | ICD-10-CM | POA: Diagnosis not present

## 2020-10-19 DIAGNOSIS — C7951 Secondary malignant neoplasm of bone: Secondary | ICD-10-CM

## 2020-10-19 DIAGNOSIS — M50223 Other cervical disc displacement at C6-C7 level: Secondary | ICD-10-CM | POA: Diagnosis not present

## 2020-10-19 DIAGNOSIS — I10 Essential (primary) hypertension: Secondary | ICD-10-CM | POA: Diagnosis present

## 2020-10-19 DIAGNOSIS — Y9301 Activity, walking, marching and hiking: Secondary | ICD-10-CM | POA: Diagnosis present

## 2020-10-19 DIAGNOSIS — I6782 Cerebral ischemia: Secondary | ICD-10-CM | POA: Diagnosis not present

## 2020-10-19 DIAGNOSIS — C50919 Malignant neoplasm of unspecified site of unspecified female breast: Secondary | ICD-10-CM | POA: Diagnosis present

## 2020-10-19 DIAGNOSIS — Z803 Family history of malignant neoplasm of breast: Secondary | ICD-10-CM

## 2020-10-19 DIAGNOSIS — C7989 Secondary malignant neoplasm of other specified sites: Secondary | ICD-10-CM | POA: Diagnosis present

## 2020-10-19 DIAGNOSIS — R918 Other nonspecific abnormal finding of lung field: Secondary | ICD-10-CM | POA: Diagnosis not present

## 2020-10-19 DIAGNOSIS — D509 Iron deficiency anemia, unspecified: Secondary | ICD-10-CM | POA: Diagnosis present

## 2020-10-19 DIAGNOSIS — D72829 Elevated white blood cell count, unspecified: Secondary | ICD-10-CM | POA: Diagnosis present

## 2020-10-19 DIAGNOSIS — H5509 Other forms of nystagmus: Secondary | ICD-10-CM | POA: Diagnosis present

## 2020-10-19 DIAGNOSIS — Z800 Family history of malignant neoplasm of digestive organs: Secondary | ICD-10-CM | POA: Diagnosis not present

## 2020-10-19 DIAGNOSIS — R609 Edema, unspecified: Secondary | ICD-10-CM | POA: Diagnosis not present

## 2020-10-19 DIAGNOSIS — C799 Secondary malignant neoplasm of unspecified site: Secondary | ICD-10-CM | POA: Diagnosis present

## 2020-10-19 DIAGNOSIS — H748X3 Other specified disorders of middle ear and mastoid, bilateral: Secondary | ICD-10-CM | POA: Diagnosis not present

## 2020-10-19 DIAGNOSIS — M7989 Other specified soft tissue disorders: Secondary | ICD-10-CM | POA: Diagnosis not present

## 2020-10-19 DIAGNOSIS — M50222 Other cervical disc displacement at C5-C6 level: Secondary | ICD-10-CM | POA: Diagnosis not present

## 2020-10-19 DIAGNOSIS — Z9049 Acquired absence of other specified parts of digestive tract: Secondary | ICD-10-CM | POA: Diagnosis not present

## 2020-10-19 DIAGNOSIS — Z8249 Family history of ischemic heart disease and other diseases of the circulatory system: Secondary | ICD-10-CM | POA: Diagnosis not present

## 2020-10-19 DIAGNOSIS — G8929 Other chronic pain: Secondary | ICD-10-CM | POA: Diagnosis not present

## 2020-10-19 DIAGNOSIS — Z79899 Other long term (current) drug therapy: Secondary | ICD-10-CM | POA: Diagnosis not present

## 2020-10-19 DIAGNOSIS — Z7189 Other specified counseling: Secondary | ICD-10-CM

## 2020-10-19 DIAGNOSIS — F419 Anxiety disorder, unspecified: Secondary | ICD-10-CM | POA: Diagnosis present

## 2020-10-19 DIAGNOSIS — E876 Hypokalemia: Secondary | ICD-10-CM | POA: Diagnosis present

## 2020-10-19 DIAGNOSIS — J3489 Other specified disorders of nose and nasal sinuses: Secondary | ICD-10-CM | POA: Diagnosis not present

## 2020-10-19 DIAGNOSIS — Z20822 Contact with and (suspected) exposure to covid-19: Secondary | ICD-10-CM | POA: Diagnosis present

## 2020-10-19 DIAGNOSIS — R42 Dizziness and giddiness: Secondary | ICD-10-CM | POA: Diagnosis present

## 2020-10-19 HISTORY — DX: Secondary malignant neoplasm of bone: C79.51

## 2020-10-19 HISTORY — DX: Other specified counseling: Z71.89

## 2020-10-19 LAB — CBC WITH DIFFERENTIAL/PLATELET
Abs Immature Granulocytes: 0.31 10*3/uL — ABNORMAL HIGH (ref 0.00–0.07)
Basophils Absolute: 0.1 10*3/uL (ref 0.0–0.1)
Basophils Relative: 1 %
Eosinophils Absolute: 0 10*3/uL (ref 0.0–0.5)
Eosinophils Relative: 0 %
HCT: 31.1 % — ABNORMAL LOW (ref 36.0–46.0)
Hemoglobin: 9.2 g/dL — ABNORMAL LOW (ref 12.0–15.0)
Immature Granulocytes: 2 %
Lymphocytes Relative: 8 %
Lymphs Abs: 1.1 10*3/uL (ref 0.7–4.0)
MCH: 22.9 pg — ABNORMAL LOW (ref 26.0–34.0)
MCHC: 29.6 g/dL — ABNORMAL LOW (ref 30.0–36.0)
MCV: 77.4 fL — ABNORMAL LOW (ref 80.0–100.0)
Monocytes Absolute: 1.3 10*3/uL — ABNORMAL HIGH (ref 0.1–1.0)
Monocytes Relative: 9 %
Neutro Abs: 11.7 10*3/uL — ABNORMAL HIGH (ref 1.7–7.7)
Neutrophils Relative %: 80 %
Platelets: 421 10*3/uL — ABNORMAL HIGH (ref 150–400)
RBC: 4.02 MIL/uL (ref 3.87–5.11)
RDW: 16.3 % — ABNORMAL HIGH (ref 11.5–15.5)
WBC: 14.6 10*3/uL — ABNORMAL HIGH (ref 4.0–10.5)
nRBC: 0 % (ref 0.0–0.2)

## 2020-10-19 LAB — BASIC METABOLIC PANEL
Anion gap: 13 (ref 5–15)
BUN: 18 mg/dL (ref 6–20)
CO2: 25 mmol/L (ref 22–32)
Calcium: 10 mg/dL (ref 8.9–10.3)
Chloride: 101 mmol/L (ref 98–111)
Creatinine, Ser: 0.76 mg/dL (ref 0.44–1.00)
GFR, Estimated: >60 mL/min (ref >60)
Glucose, Bld: 135 mg/dL — ABNORMAL HIGH (ref 70–99)
Potassium: 3.4 mmol/L — ABNORMAL LOW (ref 3.5–5.1)
Sodium: 139 mmol/L (ref 135–145)

## 2020-10-19 LAB — IRON AND TIBC
Iron: 32 ug/dL (ref 28–170)
Saturation Ratios: 15 % (ref 10.4–31.8)
TIBC: 213 ug/dL — ABNORMAL LOW (ref 250–450)
UIBC: 181 ug/dL

## 2020-10-19 LAB — HIV ANTIBODY (ROUTINE TESTING W REFLEX): HIV Screen 4th Generation wRfx: NONREACTIVE

## 2020-10-19 LAB — FERRITIN: Ferritin: 1156 ng/mL — ABNORMAL HIGH (ref 11–307)

## 2020-10-19 MED ORDER — ZOLEDRONIC ACID 4 MG/5ML IV CONC: 4.0000 mg | Freq: Once | INTRAVENOUS | Status: DC

## 2020-10-19 MED ORDER — GADOBUTROL 1 MMOL/ML IV SOLN
10.0000 mL | Freq: Once | INTRAVENOUS | Status: AC | PRN
  Administered 2020-10-19: 10 mL via INTRAVENOUS

## 2020-10-19 MED ORDER — OXYCODONE HCL 5 MG PO TABS: 2.5000 mg | ORAL_TABLET | ORAL | Status: DC | PRN

## 2020-10-19 MED ORDER — SODIUM CHLORIDE 0.9 % IV SOLN
90.0000 mg | Freq: Once | INTRAVENOUS | Status: AC
  Administered 2020-10-19: 90 mg via INTRAVENOUS
  Filled 2020-10-19: qty 10

## 2020-10-19 MED ORDER — SODIUM CHLORIDE 0.9 % IV SOLN: INTRAVENOUS | Status: AC

## 2020-10-19 MED ORDER — OXYCODONE HCL 5 MG PO TABS
5.0000 mg | ORAL_TABLET | ORAL | Status: DC | PRN
  Administered 2020-10-19 – 2020-10-21 (×7): 5 mg via ORAL
  Filled 2020-10-19 (×6): qty 1

## 2020-10-19 MED ORDER — POTASSIUM CHLORIDE 20 MEQ PO PACK
40.0000 meq | PACK | Freq: Once | ORAL | Status: AC
  Administered 2020-10-19: 40 meq via ORAL
  Filled 2020-10-19: qty 2

## 2020-10-19 MED ORDER — IOHEXOL 9 MG/ML PO SOLN
500.0000 mL | ORAL | Status: AC
Start: 2020-10-19 — End: 2020-10-19
  Administered 2020-10-19 (×2): 500 mL via ORAL

## 2020-10-19 MED ORDER — SODIUM CHLORIDE 0.9 % IV SOLN
510.0000 mg | Freq: Once | INTRAVENOUS | Status: AC
  Administered 2020-10-19: 510 mg via INTRAVENOUS
  Filled 2020-10-19: qty 17

## 2020-10-19 MED ORDER — IOHEXOL 300 MG/ML  SOLN
100.0000 mL | Freq: Once | INTRAMUSCULAR | Status: AC | PRN
  Administered 2020-10-19: 100 mL via INTRAVENOUS

## 2020-10-19 NOTE — Progress Notes (Signed)
PROGRESS NOTE    Annette James  KBT:[id number] DOB: 1960-11-18 DOA: 10/18/2020 PCP: Patient, No Pcp Per  Status is: Inpatient  Remains inpatient appropriate because:Ongoing diagnostic testing needed not appropriate for outpatient work up   Dispo: The patient is from: Home              Anticipated d/c is to: Home              Anticipated d/c date is: 2 days              Patient currently is not medically stable to d/c.  Brief Narrative:  60 year old presenting with fall at home, headache and vertigo likely due to symptomatic metastatic cancer, uncertain primary.  In the emergency room the patient was noted to have an elevated white count with lytic lesions in the C-spine as well as in the calvarium.  Oncology was consulted in the emergency room and planning for inpatient staging evaluation.   Assessment & Plan:   Principal Problem:   Metastatic cancer to spine [facility]) Active Problems:   Labyrinthitis   Lytic bone lesions on xray   Breast mass, right   Goals of care, counseling/discussion   Metastatic cancer ([place])  Lytic Lesions of Spine and right breast mass, possible primaries include breast malignancy also must consider multiple myeloma.  Appreciate oncology evaluation and input.  Planning for CT abdomen pelvis, MRI cervical spine, bone scan, MRI breast,  serum markers malignancy for breast cancer,myeloma panel, and 24 hour urine light chains.   Headache, in addition to vertigo, in part symptoms most likely from malignancy. Tylenol as needed. Oxycodone as cannot have NSAIDs given need for contrast, anemia.  Iron deficiency anemia s/p ferraheme on 1/8. Continue to monitor.   Leukocytosis, no symptoms of infection, suspect reactive, monitor.  Hypokalemia, repleted.   Hypertension- consider starting amlodipine if persistently elevated, likely currently elevated due to pain, new diagnosis.   DVT prophylaxis: Enoxaparin   Code Status: FULL  Family Communication: sister at  bedside (nurse at [employer])  Disposition Plan: Home after oncology evaluation    Consultants:   Oncology   Procedures:   None   Antimicrobials:   NOne    Subjective: Patient tearful after talking with Oncology. Dizziness improved, persistent headache. Endorses joint pains. No chest pain, dyspnea.   Objective: Vitals:   10/19/20 0030 10/19/20 0119 10/19/20 0500 10/19/20 0833  BP: (!) 168/95 (!) 176/95 (!) 149/82 (!) 183/87  Pulse: 100 96 92 96  Resp: (!) '22 18 16 17  ' Temp:  99.1 F (37.3 C) 97.9 F (36.6 C) 98.6 F (37 C)  TempSrc:  Oral Oral Oral  SpO2: 94% 91% 94% 95%  Weight:      Height:        Intake/Output Summary (Last 24 hours) at 10/19/2020 1417 Last data filed at 10/19/2020 0942 Gross per 24 hour  Intake 3 ml  Output --  Net 3 ml   Filed Weights   10/18/20 0811  Weight: 108.9 kg    Examination:  General exam: Tearful  Respiratory system: Clear to auscultation. Respiratory effort normal. Cardiovascular system: S1 & S2 heard, RRR. No JVD, murmurs, rubs, gallops or clicks. No pedal edema. Gastrointestinal system: Abdomen is nondistended, soft and nontender. Central nervous system: Alert and oriented. No focal neurological deficits. Skin: No rashes, lesions or ulcers Psychiatry: Alert, appropriate with sister Data Reviewed: I have personally reviewed following labs and imaging studies  CBC: Recent Labs  Lab 10/18/20 0819 10/19/20  0400  WBC 15.9* 14.6*  NEUTROABS  --  11.7*  HGB 10.9* 9.2*  HCT 36.2 31.1*  MCV 77.7* 77.4*  PLT 519* 854*   Basic Metabolic Panel: Recent Labs  Lab 10/18/20 0819 10/19/20 0400  NA 140 139  K 3.4* 3.4*  CL 100 101  CO2 26 25  GLUCOSE 122* 135*  BUN 14 18  CREATININE 0.94 0.76  CALCIUM 10.7* 10.0   GFR: Estimated Creatinine Clearance: 100.3 mL/min (by C-G formula based on SCr of 0.76 mg/dL). Liver Function Tests: No results for input(s): AST, ALT, ALKPHOS, BILITOT, PROT, ALBUMIN in the last 168 hours. No  results for input(s): LIPASE, AMYLASE in the last 168 hours. No results for input(s): AMMONIA in the last 168 hours. Coagulation Profile: No results for input(s): INR, PROTIME in the last 168 hours. Cardiac Enzymes: No results for input(s): CKTOTAL, CKMB, CKMBINDEX, TROPONINI in the last 168 hours. BNP (last 3 results) No results for input(s): PROBNP in the last 8760 hours. HbA1C: No results for input(s): HGBA1C in the last 72 hours. CBG: No results for input(s): GLUCAP in the last 168 hours. Lipid Profile: No results for input(s): CHOL, HDL, LDLCALC, TRIG, CHOLHDL, LDLDIRECT in the last 72 hours. Thyroid Function Tests: No results for input(s): TSH, T4TOTAL, FREET4, T3FREE, THYROIDAB in the last 72 hours. Anemia Panel: Recent Labs    10/19/20 0746  FERRITIN 1,156*  TIBC 213*  IRON 32   Sepsis Labs: No results for input(s): PROCALCITON, LATICACIDVEN in the last 168 hours.  Recent Results (from the past 240 hour(s))  Resp Panel by RT-PCR (Flu A&B, Covid) Nasopharyngeal Swab     Status: None   Collection Time: 10/18/20  8:35 PM   Specimen: Nasopharyngeal Swab; Nasopharyngeal(NP) swabs in vial transport medium  Result Value Ref Range Status   SARS Coronavirus 2 by RT PCR NEGATIVE NEGATIVE Final    Comment: (NOTE) SARS-CoV-2 target nucleic acids are NOT DETECTED.  The SARS-CoV-2 RNA is generally detectable in upper respiratory specimens during the acute phase of infection. The lowest concentration of SARS-CoV-2 viral copies this assay can detect is 138 copies/mL. A negative result does not preclude SARS-Cov-2 infection and should not be used as the sole basis for treatment or other patient management decisions. A negative result may occur with  improper specimen collection/handling, submission of specimen other than nasopharyngeal swab, presence of viral mutation(s) within the areas targeted by this assay, and inadequate number of viral copies(<138 copies/mL). A negative  result must be combined with clinical observations, patient history, and epidemiological information. The expected result is Negative.  Fact Sheet for Patients:  EntrepreneurPulse.com.au  Fact Sheet for Healthcare Providers:  IncredibleEmployment.be  This test is no t yet approved or cleared by the [place] FDA and  has been authorized for detection and/or diagnosis of SARS-CoV-2 by FDA under an Emergency Use Authorization (EUA). This EUA will remain  in effect (meaning this test can be used) for the duration of the COVID-19 declaration under Section 564(b)(1) of the Act, 21 U.S.C.section 360bbb-3(b)(1), unless the authorization is terminated  or revoked sooner.       Influenza A by PCR NEGATIVE NEGATIVE Final   Influenza B by PCR NEGATIVE NEGATIVE Final    Comment: (NOTE) The Xpert Xpress SARS-CoV-2/FLU/RSV plus assay is intended as an aid in the diagnosis of influenza from Nasopharyngeal swab specimens and should not be used as a sole basis for treatment. Nasal washings and aspirates are unacceptable for Xpert Xpress SARS-CoV-2/FLU/RSV testing.  Fact  Sheet for Patients: EntrepreneurPulse.com.au  Fact Sheet for Healthcare Providers: IncredibleEmployment.be  This test is not yet approved or cleared by the [place] FDA and has been authorized for detection and/or diagnosis of SARS-CoV-2 by FDA under an Emergency Use Authorization (EUA). This EUA will remain in effect (meaning this test can be used) for the duration of the COVID-19 declaration under Section 564(b)(1) of the Act, 21 U.S.C. section 360bbb-3(b)(1), unless the authorization is terminated or revoked.  Performed at [facility], [place] [address], [place],  [address]          Radiology Studies: DG Ribs Unilateral W/Chest Right  Result Date: 10/18/2020 CLINICAL DATA:  Pain after fall. EXAM: RIGHT RIBS AND CHEST - 3+  VIEW COMPARISON:  None. FINDINGS: No fracture or other bone lesions are seen involving the ribs. There is no evidence of pneumothorax or pleural effusion. Both lungs are clear. Heart size and mediastinal contours are within normal limits. IMPRESSION: Negative. Electronically Signed   By: Dorise Bullion III M.D   On: 10/18/2020 19:02   CT Head Wo Contrast  Result Date: 10/18/2020 CLINICAL DATA:  Status post fall, dizziness. Discussion with PA, no known malignancy. EXAM: CT HEAD WITHOUT CONTRAST CT CERVICAL SPINE WITHOUT CONTRAST TECHNIQUE: Multidetector CT imaging of the head and cervical spine was performed following the standard protocol without intravenous contrast. Multiplanar CT image reconstructions of the cervical spine were also generated. COMPARISON:  None. FINDINGS: CT HEAD FINDINGS Brain: No evidence of large-territorial acute infarction. No parenchymal hemorrhage. No mass lesion. No extra-axial collection. No mass effect or midline shift. No hydrocephalus. Basilar cisterns are patent. Vascular: No hyperdense vessel. Skull: Suggestion of several calvarial lytic lesions. No acute fracture or focal lesion. Sinuses/Orbits: Paranasal sinuses and mastoid air cells are clear. The orbits are unremarkable. Other: None. CT CERVICAL SPINE FINDINGS Alignment: Normal. Skull base and vertebrae: No acute fracture. Innumerable lytic lesions of the cervical spine involving the vertebral bodies as well as posterior elements. Degenerative changes of the spine and the C5-C6 level. Soft tissues and spinal canal: No prevertebral fluid or swelling. No visible canal hematoma. Disc levels:  Maintained. Upper chest: Unremarkable. Other: None. IMPRESSION: 1. 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 No definite associated pathologic fracture of the cervical spine. Recommend workup for malignancy. 2. No acute intracranial abnormality. Please note markedly limited evaluation for intracranial  metastases. Consider MRI brain with and without contrast for further evaluation given above findings. 3. No acute displaced fracture or traumatic listhesis of the cervical spine. These results were called by telephone at the time of interpretation on 10/18/2020 at 7:26 pm to provider PA [facility] L[place] , who verbally acknowledged these results. Electronically Signed   By: Iven Finn M.D.   On: 10/18/2020 19:31   CT Cervical Spine Wo Contrast  Result Date: 10/18/2020 CLINICAL DATA:  Status post fall, dizziness. Discussion with PA, no known malignancy. EXAM: CT HEAD WITHOUT CONTRAST CT CERVICAL SPINE WITHOUT CONTRAST TECHNIQUE: Multidetector CT imaging of the head and cervical spine was performed following the standard protocol without intravenous contrast. Multiplanar CT image reconstructions of the cervical spine were also generated. COMPARISON:  None. FINDINGS: CT HEAD FINDINGS Brain: No evidence of large-territorial acute infarction. No parenchymal hemorrhage. No mass lesion. No extra-axial collection. No mass effect or midline shift. No hydrocephalus. Basilar cisterns are patent. Vascular: No hyperdense vessel. Skull: Suggestion of several calvarial lytic lesions. No acute fracture or focal lesion. Sinuses/Orbits: Paranasal sinuses and mastoid air cells are  clear. The orbits are unremarkable. Other: None. CT CERVICAL SPINE FINDINGS Alignment: Normal. Skull base and vertebrae: No acute fracture. Innumerable lytic lesions of the cervical spine involving the vertebral bodies as well as posterior elements. Degenerative changes of the spine and the C5-C6 level. Soft tissues and spinal canal: No prevertebral fluid or swelling. No visible canal hematoma. Disc levels:  Maintained. Upper chest: Unremarkable. Other: None. IMPRESSION: 1. 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 No definite associated pathologic fracture of the cervical spine. Recommend workup for malignancy. 2. No acute  intracranial abnormality. Please note markedly limited evaluation for intracranial metastases. Consider MRI brain with and without contrast for further evaluation given above findings. 3. No acute displaced fracture or traumatic listhesis of the cervical spine. These results were called by telephone at the time of interpretation on 10/18/2020 at 7:26 pm to provider PA [facility] L[place] , who verbally acknowledged these results. Electronically Signed   By: Iven Finn M.D.   On: 10/18/2020 19:31   MR BRAIN W WO CONTRAST  Result Date: 10/19/2020 CLINICAL DATA:  Initial evaluation for metastatic disease. EXAM: MRI HEAD WITHOUT AND WITH CONTRAST TECHNIQUE: Multiplanar, multiecho pulse sequences of the brain and surrounding structures were obtained without and with intravenous contrast. CONTRAST:  104m GADAVIST GADOBUTROL 1 MMOL/ML IV SOLN COMPARISON:  Prior CT from 10/18/2020. FINDINGS: Brain: Cerebral volume within normal limits for age. Mild scattered patchy T2/FLAIR hyperintensity within the periventricular, deep, and subcortical white matter both cerebral hemispheres, nonspecific, but most commonly related to chronic microvascular ischemic disease, mild for age. No abnormal foci of restricted diffusion to suggest acute or subacute ischemia. Gray-white matter differentiation maintained. No encephalomalacia to suggest chronic cortical infarction. No acute or chronic intracranial hemorrhage. No mass lesion, midline shift or mass effect. No hydrocephalus or extra-axial fluid collection. Pituitary gland and suprasellar region within normal limits. Midline structures intact. No abnormal enhancement within the brain. No evidence for intracranial metastatic disease within the brain itself. Vascular: Major intracranial vascular flow voids are maintained. Skull and upper cervical spine: Craniocervical junction normal. Diffusely abnormal signal intensity seen throughout the visualized bone marrow with multiple scattered  lesions seen throughout the calvarium and visualized upper cervical spine, consistent with osseous metastases and/or myeloma. Most prominent of these present at the left frontal calvarium and measures 1.7 cm (series 11, image 63). No significant extra osseous extension of disease. No scalp soft tissue abnormality. Sinuses/Orbits: Globes and orbital soft tissues within normal limits. Mild scattered mucosal thickening noted within the paranasal sinuses. No air-fluid levels to suggest acute sinusitis. Trace bilateral mastoid effusions, of doubtful significance. Other: None. IMPRESSION: 1. Multiple scattered lesions throughout the calvarium and upper cervical spine, consistent with osseous metastases and/or myeloma. No extra osseous extension of disease. 2. No other acute intracranial abnormality. No evidence for intracranial metastatic disease within the brain itself. 3. Mild cerebral white matter disease, nonspecific, but most commonly related to chronic microvascular ischemic disease. Electronically Signed   By: BJeannine BogaM.D.   On: 10/19/2020 04:20   DG Bone Survey Met  Result Date: 10/19/2020 CLINICAL DATA:  Lytic lesion of bone on x-ray. EXAM: METASTATIC BONE SURVEY COMPARISON:  None. FINDINGS: Several lytic lesions are seen throughout the calvarium consistent with recent CT findings. There appears to be a subtle lytic lesion in the lateral left humeral head. A lytic lesion is suspected in the left coracoid and left distal clavicle. No definitive lytic lesions in the right humerus. No definitive lytic lesions in the left  radius or ulna. No definitive lytic lesions in the right radius or ulna. Known lytic lesions are seen in the cervical spine at multiple levels, consistent with recent CT findings. The findings are much more dramatic on CT imaging. Anterior wedging is seen of a lower thoracic vertebral body. While evaluation is limited, multiple lytic lesions are suspected in the thoracic spine. While  subtle, I do suspect scattered lytic lesions in the lumbar spine. Multiple lytic lesions are seen in the ribs. This is extremely subtle on frontal views but apparent on the lumbar spine lateral views. Lytic lesions seen throughout the pelvic bones and proximal femurs. No definitive lytic lesions in the tibias or fibulas bilaterally. IMPRESSION: Widespread bony lytic lesions consistent with lytic metastases versus multiple myeloma. Electronically Signed   By: Dorise Bullion III M.D   On: 10/19/2020 11:36   DG Hip Unilat With Pelvis 2-3 Views Left  Result Date: 10/18/2020 CLINICAL DATA:  [place] down steps this morning.  Bilateral hip pain. EXAM: DG HIP (WITH OR WITHOUT PELVIS) 2-3V LEFT COMPARISON:  None. FINDINGS: No fracture. Subtle lucencies are noted on the AP pelvis view, which are not well-defined and some of which are overlying bowel gas. No definite osteolytic lesions and no evidence of an osteoblastic lesion. Right SI joint is narrowed with bordering sclerosis. The left SI joint, both hip joints and the pubic symphysis are normally spaced and aligned with no significant arthropathic change. Soft tissues are unremarkable. IMPRESSION: 1. No fracture or acute finding. 2. Right SI joint degenerative/arthropathic change. Electronically Signed   By: Lajean Manes M.D.   On: 10/18/2020 08:46   DG Hip Unilat  With Pelvis 2-3 Views Right  Result Date: 10/18/2020 CLINICAL DATA:  [place] down steps this morning.  Bilateral hip pain. EXAM: DG HIP (WITH OR WITHOUT PELVIS) 2-3V RIGHT COMPARISON:  None. FINDINGS: No fracture. Patchy sclerosis is noted along the right SI joint. SI joint is normally aligned but mildly narrowed. Right hip joint is normally spaced and aligned. SI joint is normally aligned. Soft tissues are unremarkable. IMPRESSION: 1. No fracture or dislocation. 2. Right SI joint arthropathic changes. Well maintained right hip joint. Electronically Signed   By: Lajean Manes M.D.   On: 10/18/2020 08:43         Scheduled Meds: . enoxaparin (LOVENOX) injection  55 mg Subcutaneous QHS  . loratadine  10 mg Oral BID  . meclizine  25 mg Oral TID  . sodium chloride flush  3 mL Intravenous Q12H   Continuous Infusions: . sodium chloride    . sodium chloride    . ferumoxytol    . pamidronate       LOS: 0 days    Time spent: 30 minutes     Dorris Singh, MD   [facility] Pager [phone]  If 7PM-7AM, please contact night-coverage [email or web address] Password [facility] 10/19/2020, 2:17 PM

## 2020-10-19 NOTE — ED Notes (Signed)
This RN assisted pt to bathroom in the room. Pt had no complaints of dizziness while walking to bathroom.

## 2020-10-19 NOTE — Consult Note (Signed)
Referral MD  Reason for Referral: Right breast mass and lytic bone lesions in the skull and cervical spine  Chief Complaint  Patient presents with  . Dizziness  . Fall  : I fell.  HPI: Annette James is a very nice 60 year old postmenopausal white female.  She is originally from [place].  She has been down in [place] for about a year.  She works for a [employer].  She has 4 daughters.  She has 1 granddaughter and a second grandchild on the way.  She has no family doctor.  Her last mammogram was about 2 or 3 years ago.  There is a family history of breast cancer with her mother had breast cancer in her 32s.  There is also history of colon cancer.  Her father died of colon cancer.  He was in his 82s.  She has noted a lump in the right breast.  Has not been painful.  Does not cause any nipple discharge.  She apparently lost her balance a little bit and fell.  She did not lose consciousness.  She was taken to the emergency room.  She had a CT scan of the cervical spine and head.  Surprisingly, the CT scan showed lytic lesions in the calvarium and in the cervical spine.  She then underwent an MRI of the brain.  This showed the scattered lesions throughout the calvarium of her cervical spine.  She had no intracranial masses.  When she came in, her labs showed a sodium 139.  Potassium 3.4.  BUN 18 and creatinine 0.76.  Calcium was 10.  Her white cell count was 14.6.  Hemoglobin 9.2.  Platelet count 421,000.  Her MCV was 77..  She is iron deficient.  Her ferritin is 1156 which is elevated which I am sure is secondary to an underlying malignancy.  Her iron saturation is only 15%.  She has not noted any bleeding.  Is been no melena or bright red blood per rectum.  She has lost a little weight but she has been exercising.  She has a headache right now.  Prior to today, she is not having any issues with pain.  She has not noted any swollen lymph nodes.  She has had past surgery.  She  has had her gallbladder taken out.  She has had for C-sections.  She does not smoke.  She really does not drink alcohol.  She has had no rashes.  Overall, her performance status is ECOG 1.  Her sister was with her.  Her sister is a Marine scientist for [employer].     Past Medical History:  Diagnosis Date  . Chicken pox   :  Past Surgical History:  Procedure Laterality Date  . APPENDECTOMY    . CESAREAN SECTION     x 4  . CHOLECYSTECTOMY    :   Current Facility-Administered Medications:  .  0.9 %  sodium chloride infusion, 250 mL, Intravenous, PRN, Norins, Heinz Knuckles, MD .  0.9 %  sodium chloride infusion, , Intravenous, Continuous, Zymier Rodgers, Rudell Cobb, MD .  acetaminophen (TYLENOL) tablet 650 mg, 650 mg, Oral, Q6H PRN, 650 mg at 10/19/20 0755 **OR** acetaminophen (TYLENOL) suppository 650 mg, 650 mg, Rectal, Q6H PRN, Norins, Heinz Knuckles, MD .  enoxaparin (LOVENOX) injection 55 mg, 55 mg, Subcutaneous, QHS, Norins, Heinz Knuckles, MD, 55 mg at 10/19/20 0327 .  ferumoxytol (FERAHEME) 510 mg in sodium chloride 0.9 % 100 mL IVPB, 510 mg, Intravenous, Once, Deniesha Stenglein, Rudell Cobb,  MD .  ketorolac (TORADOL) 30 MG/ML injection 30 mg, 30 mg, Intravenous, Q6H PRN, Norins, Heinz Knuckles, MD, 30 mg at 10/19/20 0319 .  loratadine (CLARITIN) tablet 10 mg, 10 mg, Oral, BID, Norins, Heinz Knuckles, MD, 10 mg at 10/19/20 0941 .  meclizine (ANTIVERT) tablet 25 mg, 25 mg, Oral, TID, Norins, Heinz Knuckles, MD, 25 mg at 10/19/20 0941 .  sodium chloride flush (NS) 0.9 % injection 3 mL, 3 mL, Intravenous, Q12H, Norins, Heinz Knuckles, MD, 3 mL at 10/19/20 0942 .  sodium chloride flush (NS) 0.9 % injection 3 mL, 3 mL, Intravenous, PRN, Norins, Heinz Knuckles, MD .  zolpidem (AMBIEN) tablet 5 mg, 5 mg, Oral, QHS PRN, Norins, Heinz Knuckles, MD:  . enoxaparin (LOVENOX) injection  55 mg Subcutaneous QHS  . loratadine  10 mg Oral BID  . meclizine  25 mg Oral TID  . sodium chloride flush  3 mL Intravenous Q12H  :  No Known Allergies:  Family  History  Problem Relation Age of Onset  . Breast cancer Mother   . Hypertension Mother   . Colon cancer Father   . Hypertension Father   :  Social History   Socioeconomic History  . Marital status: Widowed    Spouse name: Not on file  . Number of children: Not on file  . Years of education: Not on file  . Highest education level: Not on file  Occupational History  . Not on file  Tobacco Use  . Smoking status: Not on file  . Smokeless tobacco: Not on file  Substance and Sexual Activity  . Alcohol use: Not on file  . Drug use: Not on file  . Sexual activity: Not on file  Other Topics Concern  . Not on file  Social History Narrative  . Not on file   Social Determinants of Health   Financial Resource Strain: Not on file  Food Insecurity: Not on file  Transportation Needs: Not on file  Physical Activity: Not on file  Stress: Not on file  Social Connections: Not on file  Intimate Partner Violence: Not on file  :  Review of Systems  Constitutional: Negative.   HENT: Negative.   Eyes: Negative.   Respiratory: Negative.   Cardiovascular: Negative.   Gastrointestinal: Negative.   Genitourinary: Negative.   Musculoskeletal: Positive for joint pain and neck pain.  Skin: Negative.   Neurological: Positive for headaches.  Endo/Heme/Allergies: Negative.   Psychiatric/Behavioral: Negative.      Exam:  This is a well-developed and well-nourished white female in no obvious distress.  Vital signs show temperature of 98.6.  Pulse 96.  Blood pressure 183/87.  Her weight is 240 pounds.  Head and neck exam shows no ocular or oral lesions.  There is no scleral icterus.  She has good extraocular muscle movement.  There is no adenopathy in the neck.  Thyroid is nonpalpable.  Lungs are clear bilaterally.  Cardiac exam regular rate and rhythm with no murmurs, rubs or bruits.  Breast exam shows left breast with no masses, edema or erythema.  Her right breast does show a mass at about  the 12 o'clock position.  This is firm.  It is not mobile.  It probably measures about 3 x 3 cm.  There is no right axillary adenopathy.  Abdomen is soft.  She is moderately obese.  She has good bowel sounds.  There is no fluid wave.  There is no palpable liver or spleen tip.  Extremities shows no  clubbing, cyanosis or edema.  She has good range of motion of her joints.  She has good pulses in her distal extremities.  Neurological exam shows no focal neurological deficits.  Skin exam shows no rashes, ecchymoses or petechia.  Patient Vitals for the past 24 hrs:  BP Temp Temp src Pulse Resp SpO2  10/19/20 0833 (!) 183/87 98.6 F (37 C) Oral 96 17 95 %  10/19/20 0500 (!) 149/82 97.9 F (36.6 C) Oral 92 16 94 %  10/19/20 0119 (!) 176/95 99.1 F (37.3 C) Oral 96 18 91 %  10/19/20 0030 (!) 168/95 -- -- 100 (!) 22 94 %  10/19/20 0000 -- 98.8 F (37.1 C) Oral -- -- --  10/18/20 2315 -- -- -- 89 14 91 %  10/18/20 2300 (!) 142/84 -- -- 90 (!) 23 91 %  10/18/20 2245 (!) 158/80 -- -- 94 (!) 23 91 %  10/18/20 2240 (!) 151/86 98.9 F (37.2 C) Oral 99 (!) 21 96 %  10/18/20 2230 (!) 151/86 -- -- 98 (!) 21 90 %  10/18/20 2130 (!) 163/82 -- -- 96 (!) 21 91 %  10/18/20 2030 (!) 171/83 -- -- 99 (!) 21 94 %  10/18/20 2015 (!) 190/94 -- -- (!) 102 (!) 27 96 %  10/18/20 1930 (!) 184/92 -- -- 99 (!) 23 95 %  10/18/20 1800 (!) 175/91 -- -- (!) 104 (!) 24 95 %  10/18/20 1730 (!) 183/100 -- -- (!) 103 20 97 %  10/18/20 1727 -- -- -- (!) 107 16 98 %  10/18/20 1725 (!) 190/96 -- -- -- -- --  10/18/20 1555 (!) 177/83 98.5 F (36.9 C) Oral 100 18 94 %  10/18/20 1310 (!) 176/91 98.9 F (37.2 C) Oral 96 18 98 %     Recent Labs    10/18/20 0819 10/19/20 0400  WBC 15.9* 14.6*  HGB 10.9* 9.2*  HCT 36.2 31.1*  PLT 519* 421*   Recent Labs    10/18/20 0819 10/19/20 0400  NA 140 139  K 3.4* 3.4*  CL 100 101  CO2 26 25  GLUCOSE 122* 135*  BUN 14 18  CREATININE 0.94 0.76  CALCIUM 10.7* 10.0    Blood  smear review: None  Pathology: None    Assessment and Plan: Ms. Hoopingarner is a 60 year old white female.  She has a mass in the right breast.  She has lytic lesions in the scalp and on the cervical spine.  I have to believe that this could end up being metastatic breast cancer given that there is a history of breast cancer with her mother.  Unfortunately, because of policies, we probably not can be able to get a mammogram or biopsy of the breast mass in the hospital.  But I have to get all this as an outpatient.  However, we can certainly do staging studies.  I will get an MRI of the cervical spine.  She will have a CT scan of the chest/abdomen/pelvis.  This will tell us if we see any obvious metastatic disease outside of the spine.  At least, her x-rays of the ribs and hips did not show any obvious lytic lesions or fractures.  We will see what a CA 27.29 shows.  We really need to get a complete metabolic panel on her so we can see what her liver function studies look like.  I will get a bone survey on her to see if there are lytic lesions elsewhere.  I will  get a 24-hour urine on her just to make sure that there are is no problems or no evidence of myeloma.  This is always a possibility with lytic lesions.  We will send off an SPEP on her in get the myeloma panel on her.  Thankfully, her sister is a nurse so she has a good understanding of what is going on.  Ultimately, she is going to need a biopsy of this breast mass.  She will need to have a mammogram done.  If we are dealing with breast cancer, then we will have to see if she is estrogen positive or HER-2 positive.  This will dictate course of therapy.  Hopefully, if this is breast cancer, we just have bony metastasis.  If so, then we should be able to have a good chance of a lengthy response.  I have not talked to her about prognosis is we do not have a diagnosis as of yet.  She is very nice.  She is in great shape.  We  certainly can be aggressive with our work-up and with therapy.  She probably will need to have Zometa.  This is for the bone lesions.  I will give her a dose of Zometa in the hospital.  Annette Haw, MD  1 Cor 15:57

## 2020-10-19 NOTE — Plan of Care (Signed)
  Problem: Activity: Goal: Risk for activity intolerance will decrease Outcome: Progressing   Problem: Nutrition: Goal: Adequate nutrition will be maintained Outcome: Progressing   Problem: Coping: Goal: Level of anxiety will decrease Outcome: Progressing   Problem: Elimination: Goal: Will not experience complications related to bowel motility Outcome: Progressing   Problem: Pain Managment: Goal: General experience of comfort will improve Outcome: Progressing   

## 2020-10-19 NOTE — Plan of Care (Signed)

## 2020-10-20 ENCOUNTER — Inpatient Hospital Stay (HOSPITAL_COMMUNITY): Payer: BC Managed Care – PPO

## 2020-10-20 DIAGNOSIS — N6310 Unspecified lump in the right breast, unspecified quadrant: Secondary | ICD-10-CM

## 2020-10-20 LAB — COMPREHENSIVE METABOLIC PANEL
ALT: 23 U/L (ref 0–44)
AST: 34 U/L (ref 15–41)
Albumin: 2.1 g/dL — ABNORMAL LOW (ref 3.5–5.0)
Alkaline Phosphatase: 297 U/L — ABNORMAL HIGH (ref 38–126)
Anion gap: 10 (ref 5–15)
BUN: 18 mg/dL (ref 6–20)
CO2: 25 mmol/L (ref 22–32)
Calcium: 10 mg/dL (ref 8.9–10.3)
Chloride: 104 mmol/L (ref 98–111)
Creatinine, Ser: 0.89 mg/dL (ref 0.44–1.00)
GFR, Estimated: >60 mL/min (ref >60)
Glucose, Bld: 100 mg/dL — ABNORMAL HIGH (ref 70–99)
Potassium: 3.5 mmol/L (ref 3.5–5.1)
Sodium: 139 mmol/L (ref 135–145)
Total Bilirubin: 1 mg/dL (ref 0.3–1.2)
Total Protein: 6.4 g/dL — ABNORMAL LOW (ref 6.5–8.1)

## 2020-10-20 MED ORDER — GADOBUTROL 1 MMOL/ML IV SOLN
10.0000 mL | Freq: Once | INTRAVENOUS | Status: AC | PRN
  Administered 2020-10-20: 10 mL via INTRAVENOUS

## 2020-10-20 NOTE — Progress Notes (Signed)
PROGRESS NOTE    Annette James  HWK:[id number]  DOB: 1961-07-29  DOA: 10/18/2020 PCP: Patient, No Pcp Per Outpatient Specialists:   Hospital course:  Patient is an unfortunate 60 year old female who was admitted with dizziness and vertigo.  Work-up reveals a centimeter right breast mass and multiple lytic lesions in the calvarium and C-spine.  She is undergoing a metastatic work-up.   Subjective:  Patient without any acute concerns.  Admits that she is anxious about her diagnosis and condition.   Objective: Vitals:   10/19/20 2023 10/20/20 0414 10/20/20 0900 10/20/20 1500  BP: (!) 188/87 (!) 161/80 (!) 166/91 (!) 169/98  Pulse: (!) 101 84 96 (!) 104  Resp: '18 16 17 18  ' Temp: 98.3 F (36.8 C) 97.9 F (36.6 C) 98.5 F (36.9 C) 98.9 F (37.2 C)  TempSrc: Oral Oral Oral Oral  SpO2: 98% 95% 93% 93%  Weight:      Height:        Intake/Output Summary (Last 24 hours) at 10/20/2020 1551 Last data filed at 10/20/2020 1523 Gross per 24 hour  Intake 1240 ml  Output 1350 ml  Net -110 ml   Filed Weights   10/18/20 0811  Weight: 108.9 kg     Exam:  General: Tired appearing female sitting up in bed in no acute distress Eyes: sclera anicteric, conjuctiva mild injection bilaterally CVS: S1-S2, regular  Respiratory:  decreased air entry bilaterally secondary to decreased inspiratory effort, rales at bases  GI: NABS, soft, NT  LE: No edema.  Neuro: A/O x 3, grossly nonfocal.  Psych: patient is logical and coherent, judgement and insight appear normal, mood and affect appropriate to situation.   Assessment & Plan:   Unfortunate 60 year old female is admitted with right breast mass and lytic lesions in skull and C-spine.  Right breast mass Patient being followed by oncology and is undergoing a work-up for possible breast CA versus other causes of lytic skeletal lesions. Bone survey ordered 24-hour urine and SPEP to rule out MM She will need a mammogram and biopsy of the  breast mass Zometa ordered per oncology  Headache On as needed oxycodone and Tylenol  IDA Received Feraheme 10/19/2020  HTN She is not on any antihypertensives at home This can be addressed as an outpatient or she can started on a beta-blocker as warranted given relatively elevated resting heart rate and this might help with anxiety as well.   DVT prophylaxis: Lovenox Code Status: Full Family Communication: Patient states her distress been very involved with her care, no need to call Disposition Plan:   Patient is from: Home  Anticipated Discharge Location: Home  Barriers to Discharge: Getting work-up by oncology  Is patient medically stable for Discharge: Yes   Consultants:  Oncology  Procedures:  None yet  Antimicrobials:  None   Data Reviewed:  Basic Metabolic Panel: Recent Labs  Lab 10/18/20 0819 10/19/20 0400 10/20/20 0235  NA 140 139 139  K 3.4* 3.4* 3.5  CL 100 101 104  CO2 '26 25 25  ' GLUCOSE 122* 135* 100*  BUN '14 18 18  ' CREATININE 0.94 0.76 0.89  CALCIUM 10.7* 10.0 10.0   Liver Function Tests: Recent Labs  Lab 10/20/20 0235  AST 34  ALT 23  ALKPHOS 297*  BILITOT 1.0  PROT 6.4*  ALBUMIN 2.1*   No results for input(s): LIPASE, AMYLASE in the last 168 hours. No results for input(s): AMMONIA in the last 168 hours. CBC: Recent Labs  Lab 10/18/20 0819 10/19/20  0400  WBC 15.9* 14.6*  NEUTROABS  --  11.7*  HGB 10.9* 9.2*  HCT 36.2 31.1*  MCV 77.7* 77.4*  PLT 519* 421*   Cardiac Enzymes: No results for input(s): CKTOTAL, CKMB, CKMBINDEX, TROPONINI in the last 168 hours. BNP (last 3 results) No results for input(s): PROBNP in the last 8760 hours. CBG: No results for input(s): GLUCAP in the last 168 hours.  Recent Results (from the past 240 hour(s))  Resp Panel by RT-PCR (Flu A&B, Covid) Nasopharyngeal Swab     Status: None   Collection Time: 10/18/20  8:35 PM   Specimen: Nasopharyngeal Swab; Nasopharyngeal(NP) swabs in vial  transport medium  Result Value Ref Range Status   SARS Coronavirus 2 by RT PCR NEGATIVE NEGATIVE Final    Comment: (NOTE) SARS-CoV-2 target nucleic acids are NOT DETECTED.  The SARS-CoV-2 RNA is generally detectable in upper respiratory specimens during the acute phase of infection. The lowest concentration of SARS-CoV-2 viral copies this assay can detect is 138 copies/mL. A negative result does not preclude SARS-Cov-2 infection and should not be used as the sole basis for treatment or other patient management decisions. A negative result may occur with  improper specimen collection/handling, submission of specimen other than nasopharyngeal swab, presence of viral mutation(s) within the areas targeted by this assay, and inadequate number of viral copies(<138 copies/mL). A negative result must be combined with clinical observations, patient history, and epidemiological information. The expected result is Negative.  Fact Sheet for Patients:  EntrepreneurPulse.com.au  Fact Sheet for Healthcare Providers:  IncredibleEmployment.be  This test is no t yet approved or cleared by the [place] FDA and  has been authorized for detection and/or diagnosis of SARS-CoV-2 by FDA under an Emergency Use Authorization (EUA). This EUA will remain  in effect (meaning this test can be used) for the duration of the COVID-19 declaration under Section 564(b)(1) of the Act, 21 U.S.C.section 360bbb-3(b)(1), unless the authorization is terminated  or revoked sooner.       Influenza A by PCR NEGATIVE NEGATIVE Final   Influenza B by PCR NEGATIVE NEGATIVE Final    Comment: (NOTE) The Xpert Xpress SARS-CoV-2/FLU/RSV plus assay is intended as an aid in the diagnosis of influenza from Nasopharyngeal swab specimens and should not be used as a sole basis for treatment. Nasal washings and aspirates are unacceptable for Xpert Xpress SARS-CoV-2/FLU/RSV testing.  Fact  Sheet for Patients: EntrepreneurPulse.com.au  Fact Sheet for Healthcare Providers: IncredibleEmployment.be  This test is not yet approved or cleared by the [place] FDA and has been authorized for detection and/or diagnosis of SARS-CoV-2 by FDA under an Emergency Use Authorization (EUA). This EUA will remain in effect (meaning this test can be used) for the duration of the COVID-19 declaration under Section 564(b)(1) of the Act, 21 U.S.C. section 360bbb-3(b)(1), unless the authorization is terminated or revoked.  Performed at [facility], [address] [place], [place] [address]       Studies: DG Ribs Unilateral W/Chest Right  Result Date: 10/18/2020 CLINICAL DATA:  Pain after fall. EXAM: RIGHT RIBS AND CHEST - 3+ VIEW COMPARISON:  None. FINDINGS: No fracture or other bone lesions are seen involving the ribs. There is no evidence of pneumothorax or pleural effusion. Both lungs are clear. Heart size and mediastinal contours are within normal limits. IMPRESSION: Negative. Electronically Signed   By: Dorise Bullion III M.D   On: 10/18/2020 19:02   CT Head Wo Contrast  Result Date: 10/18/2020 CLINICAL DATA:  Status post fall, dizziness. Discussion with PA, no known malignancy. EXAM: CT HEAD WITHOUT CONTRAST CT CERVICAL SPINE WITHOUT CONTRAST TECHNIQUE: Multidetector CT imaging of the head and cervical spine was performed following the standard protocol without intravenous contrast. Multiplanar CT image reconstructions of the cervical spine were also generated. COMPARISON:  None. FINDINGS: CT HEAD FINDINGS Brain: No evidence of large-territorial acute infarction. No parenchymal hemorrhage. No mass lesion. No extra-axial collection. No mass effect or midline shift. No hydrocephalus. Basilar cisterns are patent. Vascular: No hyperdense vessel. Skull: Suggestion of several calvarial lytic lesions. No acute fracture or focal lesion. Sinuses/Orbits:  Paranasal sinuses and mastoid air cells are clear. The orbits are unremarkable. Other: None. CT CERVICAL SPINE FINDINGS Alignment: Normal. Skull base and vertebrae: No acute fracture. Innumerable lytic lesions of the cervical spine involving the vertebral bodies as well as posterior elements. Degenerative changes of the spine and the C5-C6 level. Soft tissues and spinal canal: No prevertebral fluid or swelling. No visible canal hematoma. Disc levels:  Maintained. Upper chest: Unremarkable. Other: None. IMPRESSION: 1. 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 No definite associated pathologic fracture of the cervical spine. Recommend workup for malignancy. 2. No acute intracranial abnormality. Please note markedly limited evaluation for intracranial metastases. Consider MRI brain with and without contrast for further evaluation given above findings. 3. No acute displaced fracture or traumatic listhesis of the cervical spine. These results were called by telephone at the time of interpretation on 10/18/2020 at 7:26 pm to provider PA [facility] LAYDEN , who verbally acknowledged these results. Electronically Signed   By: Iven Finn M.D.   On: 10/18/2020 19:31   CT Cervical Spine Wo Contrast  Result Date: 10/18/2020 CLINICAL DATA:  Status post fall, dizziness. Discussion with PA, no known malignancy. EXAM: CT HEAD WITHOUT CONTRAST CT CERVICAL SPINE WITHOUT CONTRAST TECHNIQUE: Multidetector CT imaging of the head and cervical spine was performed following the standard protocol without intravenous contrast. Multiplanar CT image reconstructions of the cervical spine were also generated. COMPARISON:  None. FINDINGS: CT HEAD FINDINGS Brain: No evidence of large-territorial acute infarction. No parenchymal hemorrhage. No mass lesion. No extra-axial collection. No mass effect or midline shift. No hydrocephalus. Basilar cisterns are patent. Vascular: No hyperdense vessel. Skull: Suggestion of  several calvarial lytic lesions. No acute fracture or focal lesion. Sinuses/Orbits: Paranasal sinuses and mastoid air cells are clear. The orbits are unremarkable. Other: None. CT CERVICAL SPINE FINDINGS Alignment: Normal. Skull base and vertebrae: No acute fracture. Innumerable lytic lesions of the cervical spine involving the vertebral bodies as well as posterior elements. Degenerative changes of the spine and the C5-C6 level. Soft tissues and spinal canal: No prevertebral fluid or swelling. No visible canal hematoma. Disc levels:  Maintained. Upper chest: Unremarkable. Other: None. IMPRESSION: 1. 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 No definite associated pathologic fracture of the cervical spine. Recommend workup for malignancy. 2. No acute intracranial abnormality. Please note markedly limited evaluation for intracranial metastases. Consider MRI brain with and without contrast for further evaluation given above findings. 3. No acute displaced fracture or traumatic listhesis of the cervical spine. These results were called by telephone at the time of interpretation on 10/18/2020 at 7:26 pm to provider PA [facility] LAYDEN , who verbally acknowledged these results. Electronically Signed   By: Iven Finn M.D.   On: 10/18/2020 19:31   MR BRAIN W WO CONTRAST  Result Date: 10/19/2020 CLINICAL DATA:  Initial evaluation for metastatic disease. EXAM: MRI HEAD WITHOUT AND  WITH CONTRAST TECHNIQUE: Multiplanar, multiecho pulse sequences of the brain and surrounding structures were obtained without and with intravenous contrast. CONTRAST:  55m GADAVIST GADOBUTROL 1 MMOL/ML IV SOLN COMPARISON:  Prior CT from 10/18/2020. FINDINGS: Brain: Cerebral volume within normal limits for age. Mild scattered patchy T2/FLAIR hyperintensity within the periventricular, deep, and subcortical white matter both cerebral hemispheres, nonspecific, but most commonly related to chronic microvascular ischemic  disease, mild for age. No abnormal foci of restricted diffusion to suggest acute or subacute ischemia. Gray-white matter differentiation maintained. No encephalomalacia to suggest chronic cortical infarction. No acute or chronic intracranial hemorrhage. No mass lesion, midline shift or mass effect. No hydrocephalus or extra-axial fluid collection. Pituitary gland and suprasellar region within normal limits. Midline structures intact. No abnormal enhancement within the brain. No evidence for intracranial metastatic disease within the brain itself. Vascular: Major intracranial vascular flow voids are maintained. Skull and upper cervical spine: Craniocervical junction normal. Diffusely abnormal signal intensity seen throughout the visualized bone marrow with multiple scattered lesions seen throughout the calvarium and visualized upper cervical spine, consistent with osseous metastases and/or myeloma. Most prominent of these present at the left frontal calvarium and measures 1.7 cm (series 11, image 63). No significant extra osseous extension of disease. No scalp soft tissue abnormality. Sinuses/Orbits: Globes and orbital soft tissues within normal limits. Mild scattered mucosal thickening noted within the paranasal sinuses. No air-fluid levels to suggest acute sinusitis. Trace bilateral mastoid effusions, of doubtful significance. Other: None. IMPRESSION: 1. Multiple scattered lesions throughout the calvarium and upper cervical spine, consistent with osseous metastases and/or myeloma. No extra osseous extension of disease. 2. No other acute intracranial abnormality. No evidence for intracranial metastatic disease within the brain itself. 3. Mild cerebral white matter disease, nonspecific, but most commonly related to chronic microvascular ischemic disease. Electronically Signed   By: BJeannine BogaM.D.   On: 10/19/2020 04:20   MR CERVICAL SPINE W WO CONTRAST  Result Date: 10/19/2020 CLINICAL DATA:  Metastatic  cancer of unknown primary, abnormal cervical spine CT, staging EXAM: MRI CERVICAL SPINE WITHOUT AND WITH CONTRAST TECHNIQUE: Multiplanar and multiecho pulse sequences of the cervical spine, to include the craniocervical junction and cervicothoracic junction, were obtained without and with intravenous contrast. CONTRAST:  122mGADAVIST GADOBUTROL 1 MMOL/ML IV SOLN COMPARISON:  Correlation made with CT 10/18/2020 FINDINGS: Alignment: No significant listhesis. Vertebrae: Abnormal marrow signal throughout visualized osseous structures including the cervical spine with areas of enhancement. Minor extraosseous extension is present. No significant epidural disease. Cord: No abnormal signal.  No abnormal intrathecal enhancement. Posterior Fossa, vertebral arteries, paraspinal tissues: Intracranial findings better evaluated on same day dedicated imaging. Trace prevertebral edema. Otherwise unremarkable. Disc levels: C2-C3:  No canal or foraminal stenosis. C3-C4: Punctate central annular fissure. No canal or foraminal stenosis. C4-C5:  No canal or foraminal stenosis. C5-C6: Disc bulge with endplate osteophytes. Minor canal stenosis. Minor foraminal stenosis. C6-C7: Disc bulge with endplate osteophytes. No canal stenosis. No right foraminal stenosis. Minor right and mild left foraminal stenosis. C7-T1: Right foraminal disc protrusion with endplate osteophytes. No canal or left foraminal stenosis. Mild right foraminal stenosis. IMPRESSION: Multifocal osseous metastatic disease. No significant epidural disease. Electronically Signed   By: PrMacy Mis.D.   On: 10/19/2020 12:53   MR THORACIC SPINE W WO CONTRAST  Result Date: 10/20/2020 CLINICAL DATA:  Metastatic breast cancer. EXAM: MRI THORACIC AND LUMBAR SPINE WITHOUT AND WITH CONTRAST TECHNIQUE: Multiplanar and multiecho pulse sequences of the thoracic and lumbar spine were obtained without and  with intravenous contrast. CONTRAST:  29m GADAVIST GADOBUTROL 1 MMOL/ML IV  SOLN COMPARISON:  CT chest, abdomen, and pelvis dated October 19, 2020. FINDINGS: MRI THORACIC SPINE FINDINGS Alignment:  Physiologic. Vertebrae: Innumerable enhancing lesions throughout the visualized thoracic spine and ribs. Chronic mild pathologic fractures of the T8 and T11 superior endplates. No epidural tumor. Cord:  Normal signal and morphology.  No intradural enhancement. Paraspinal and other soft tissues: Trace bilateral pleural effusions. Disc levels: Mild degenerative disc disease from T5-T6 through T11-T12. No spinal canal or neuroforaminal stenosis at any level. MRI LUMBAR SPINE FINDINGS Segmentation:  Standard. Alignment:  Physiologic. Vertebrae: Innumerable enhancing lesions throughout the visualized lumbosacral spine as well as the bilateral iliac bones. Chronic mild pathologic fracture of the S1 superior endplate. No epidural tumor. Conus medullaris: Extends to the L2 level and appears normal. No intradural enhancement. Paraspinal and other soft tissues: Negative. Disc levels: Mild degenerative disc disease at T12-L1 and L2-L3. Central disc protrusion at T6-T7 flattens the ventral cord. No spinal canal or neuroforaminal stenosis at any level. IMPRESSION: 1. Widespread osseous metastatic disease involving the thoracic and lumbosacral spine. No significant epidural tumor or cord compromise. 2. Chronic mild pathologic fractures of the T8, T11, and S1 superior endplates. 3. Trace bilateral pleural effusions. Electronically Signed   By: WTitus DubinM.D.   On: 10/20/2020 15:40   MR Lumbar Spine W Wo Contrast  Result Date: 10/20/2020 CLINICAL DATA:  Metastatic breast cancer. EXAM: MRI THORACIC AND LUMBAR SPINE WITHOUT AND WITH CONTRAST TECHNIQUE: Multiplanar and multiecho pulse sequences of the thoracic and lumbar spine were obtained without and with intravenous contrast. CONTRAST:  143mGADAVIST GADOBUTROL 1 MMOL/ML IV SOLN COMPARISON:  CT chest, abdomen, and pelvis dated October 19, 2020. FINDINGS:  MRI THORACIC SPINE FINDINGS Alignment:  Physiologic. Vertebrae: Innumerable enhancing lesions throughout the visualized thoracic spine and ribs. Chronic mild pathologic fractures of the T8 and T11 superior endplates. No epidural tumor. Cord:  Normal signal and morphology.  No intradural enhancement. Paraspinal and other soft tissues: Trace bilateral pleural effusions. Disc levels: Mild degenerative disc disease from T5-T6 through T11-T12. No spinal canal or neuroforaminal stenosis at any level. MRI LUMBAR SPINE FINDINGS Segmentation:  Standard. Alignment:  Physiologic. Vertebrae: Innumerable enhancing lesions throughout the visualized lumbosacral spine as well as the bilateral iliac bones. Chronic mild pathologic fracture of the S1 superior endplate. No epidural tumor. Conus medullaris: Extends to the L2 level and appears normal. No intradural enhancement. Paraspinal and other soft tissues: Negative. Disc levels: Mild degenerative disc disease at T12-L1 and L2-L3. Central disc protrusion at T6-T7 flattens the ventral cord. No spinal canal or neuroforaminal stenosis at any level. IMPRESSION: 1. Widespread osseous metastatic disease involving the thoracic and lumbosacral spine. No significant epidural tumor or cord compromise. 2. Chronic mild pathologic fractures of the T8, T11, and S1 superior endplates. 3. Trace bilateral pleural effusions. Electronically Signed   By: WiTitus Dubin.D.   On: 10/20/2020 15:40   CT CHEST ABDOMEN PELVIS W CONTRAST  Result Date: 10/19/2020 CLINICAL DATA:  Breast cancer staging. EXAM: CT CHEST, ABDOMEN, AND PELVIS WITH CONTRAST TECHNIQUE: Multidetector CT imaging of the chest, abdomen and pelvis was performed following the standard protocol during bolus administration of intravenous contrast. CONTRAST:  10072mMNIPAQUE IOHEXOL 300 MG/ML  SOLN COMPARISON:  October 18, 2020. FINDINGS: CT CHEST FINDINGS Cardiovascular: No significant vascular findings. Normal heart size. Trace  pericardial effusion. Mediastinum/Nodes: No axillary adenopathy. No suspicious internal mammary lymph nodes. No mediastinal adenopathy. No hilar adenopathy.  Visualized thyroid is unremarkable. Lungs/Pleura: No pneumothorax. Trace RIGHT subpulmonic effusion. Bibasilar atelectasis. There are scattered pulmonary nodules with representative nodules as follows: Nodule 1: RIGHT middle lobe pulmonary nodule adjacent to the fissure measures 3 mm (series 5, image 67). Nodule 2: RIGHT upper lobe subpleural pulmonary nodule measures 3 mm (series 5, image 29). Musculoskeletal: Within the RIGHT upper breast, there is an irregular mass which measures 3.1 by 3.1 by 3.3 cm (series 3, image 21). There are innumerable lytic and sclerotic lesions throughout the axial and appendicular skeleton. They involve every single vertebral body level, the sternum, scapula, clavicles, humeral heads and multifocal bilateral ribs. There is a moderate pathologic compression fracture deformity of T8 and a mild pathologic compression fracture deformity of T11. Lesion involving the posterior aspect of T11 extends to the epidural space (series 3, image 48). There are pathologic fractures through several of the rib lesions. CT ABDOMEN PELVIS FINDINGS Hepatobiliary: No focal liver abnormality is seen. Status post cholecystectomy. No biliary dilatation. Pancreas: Unremarkable. No pancreatic ductal dilatation or surrounding inflammatory changes. Spleen: Normal in size without focal abnormality.  Splenule. Adrenals/Urinary Tract: Adrenal glands are unremarkable. Kidneys are normal, without renal calculi, focal lesion, or hydronephrosis. Bladder is unremarkable. Stomach/Bowel: Stomach is unremarkable. No evidence of bowel obstruction. No evidence of bowel inflammation. Status post appendectomy. Vascular/Lymphatic: No significant aortic atherosclerosis. No suspicious intra-abdominal lymph nodes. Reproductive: Fibroid uterus. There is a cystic structure of the  LEFT ovary versus 2 adjacent cystic structures. It spans 5.2 x 2.7 by 3.7 cm (series 3, image 101). RIGHT adnexa is unremarkable. Other: No free air or free fluid. Musculoskeletal: Innumerable lytic and sclerotic lesions throughout the entirety of the lumbar spine, pelvis and bilateral femurs. Expansile lesion of the LEFT iliac crest. There is a possible pathologic fracture through the RIGHT sacrum (series 7, image 66). A lesion of the sacrum likely extends through the vertebral body into the residual spinal canal. Possible pathologic fracture of the RIGHT inferior pubic ramus (series 3, image 119). IMPRESSION: 1. Irregular mass in the RIGHT upper breast measuring up to 3.3 cm, consistent with primary breast malignancy. 2. Innumerable lytic and sclerotic lesions throughout the near entirety of the axial and appendicular skeleton, worrisome for diffuse osseous metastatic disease. There are pathologic compression fracture deformities of T8 and T11. 3. There is a possible nondisplaced pathologic fracture through the RIGHT sacrum. There is a possible pathologic fracture of the RIGHT inferior pubic ramus. 4. A metastatic lesion of T11 extends through the posterior aspect of the vertebral body and possibly into the epidural space. A lesion in the sacrum extends through the posterior aspect of the bone. If concern for epidural extension, consider further evaluation with dedicated contrast enhanced MRI. 5. Scattered pulmonary nodules measuring up to 3 mm, nonspecific, but favored benign. 6. There is a 5.2 cm LEFT ovarian cystic mass. Recommend further evaluation with dedicated pelvic ultrasound when clinically appropriate. 7. Trace pericardial effusion. Electronically Signed   By: Valentino Saxon MD   On: 10/19/2020 20:35   DG Bone Survey Met  Result Date: 10/19/2020 CLINICAL DATA:  Lytic lesion of bone on x-ray. EXAM: METASTATIC BONE SURVEY COMPARISON:  None. FINDINGS: Several lytic lesions are seen throughout the  calvarium consistent with recent CT findings. There appears to be a subtle lytic lesion in the lateral left humeral head. A lytic lesion is suspected in the left coracoid and left distal clavicle. No definitive lytic lesions in the right humerus. No definitive lytic lesions in the left  radius or ulna. No definitive lytic lesions in the right radius or ulna. Known lytic lesions are seen in the cervical spine at multiple levels, consistent with recent CT findings. The findings are much more dramatic on CT imaging. Anterior wedging is seen of a lower thoracic vertebral body. While evaluation is limited, multiple lytic lesions are suspected in the thoracic spine. While subtle, I do suspect scattered lytic lesions in the lumbar spine. Multiple lytic lesions are seen in the ribs. This is extremely subtle on frontal views but apparent on the lumbar spine lateral views. Lytic lesions seen throughout the pelvic bones and proximal femurs. No definitive lytic lesions in the tibias or fibulas bilaterally. IMPRESSION: Widespread bony lytic lesions consistent with lytic metastases versus multiple myeloma. Electronically Signed   By: Dorise Bullion III M.D   On: 10/19/2020 11:36     Scheduled Meds: . enoxaparin (LOVENOX) injection  55 mg Subcutaneous QHS  . loratadine  10 mg Oral BID  . meclizine  25 mg Oral TID  . sodium chloride flush  3 mL Intravenous Q12H   Continuous Infusions: . sodium chloride      Principal Problem:   Metastatic cancer to spine [facility]) Active Problems:   Labyrinthitis   Lytic bone lesions on xray   Breast mass, right   Goals of care, counseling/discussion   Metastatic cancer ([place])     Shakendra Griffeth Derek Jack, [facility]  If 7PM-7AM, please contact night-coverage [email or web address] Password [email or web address] 10/20/2020, 3:51 PM    LOS: 1 day

## 2020-10-20 NOTE — Plan of Care (Signed)
  Problem: Pain Managment: Goal: General experience of comfort will improve Outcome: Progressing   Problem: Safety: Goal: Ability to remain free from injury will improve Outcome: Progressing   Problem: Skin Integrity: Goal: Risk for impaired skin integrity will decrease Outcome: Progressing   

## 2020-10-21 ENCOUNTER — Inpatient Hospital Stay (HOSPITAL_COMMUNITY): Payer: BC Managed Care – PPO

## 2020-10-21 DIAGNOSIS — M7989 Other specified soft tissue disorders: Secondary | ICD-10-CM

## 2020-10-21 DIAGNOSIS — H8309 Labyrinthitis, unspecified ear: Secondary | ICD-10-CM

## 2020-10-21 DIAGNOSIS — R609 Edema, unspecified: Secondary | ICD-10-CM

## 2020-10-21 DIAGNOSIS — R918 Other nonspecific abnormal finding of lung field: Secondary | ICD-10-CM

## 2020-10-21 DIAGNOSIS — N6341 Unspecified lump in right breast, subareolar: Secondary | ICD-10-CM

## 2020-10-21 DIAGNOSIS — D509 Iron deficiency anemia, unspecified: Secondary | ICD-10-CM

## 2020-10-21 DIAGNOSIS — G8929 Other chronic pain: Secondary | ICD-10-CM

## 2020-10-21 DIAGNOSIS — R252 Cramp and spasm: Secondary | ICD-10-CM

## 2020-10-21 MED ORDER — OXYCODONE HCL 5 MG PO TABS
10.0000 mg | ORAL_TABLET | Freq: Four times a day (QID) | ORAL | Status: DC | PRN
  Administered 2020-10-21: 10 mg via ORAL
  Filled 2020-10-21: qty 2

## 2020-10-21 MED ORDER — METAXALONE 400 MG PO TABS: 400.0000 mg | ORAL_TABLET | Freq: Three times a day (TID) | ORAL | 0 refills | Status: DC

## 2020-10-21 MED ORDER — AMLODIPINE BESYLATE 5 MG PO TABS: 5.0000 mg | ORAL_TABLET | Freq: Every day | ORAL | 1 refills | Status: DC

## 2020-10-21 MED ORDER — METAXALONE 800 MG PO TABS
400.0000 mg | ORAL_TABLET | Freq: Three times a day (TID) | ORAL | Status: DC
  Administered 2020-10-21 (×2): 400 mg via ORAL
  Filled 2020-10-21 (×2): qty 0.5
  Filled 2020-10-21: qty 1
  Filled 2020-10-21: qty 0.5

## 2020-10-21 MED ORDER — FENTANYL 25 MCG/HR TD PT72: 1.0000 | MEDICATED_PATCH | TRANSDERMAL | 0 refills | Status: DC

## 2020-10-21 MED ORDER — OXYCODONE HCL 10 MG PO TABS: 10.0000 mg | ORAL_TABLET | Freq: Four times a day (QID) | ORAL | 0 refills | Status: DC | PRN

## 2020-10-21 MED ORDER — KETOROLAC TROMETHAMINE 30 MG/ML IJ SOLN
30.0000 mg | Freq: Three times a day (TID) | INTRAMUSCULAR | Status: DC
  Administered 2020-10-21 (×2): 30 mg via INTRAVENOUS
  Filled 2020-10-21 (×2): qty 1

## 2020-10-21 MED ORDER — FENTANYL 25 MCG/HR TD PT72
1.0000 | MEDICATED_PATCH | TRANSDERMAL | Status: DC
  Administered 2020-10-21: 1 via TRANSDERMAL
  Filled 2020-10-21: qty 1

## 2020-10-21 MED ORDER — POLYETHYLENE GLYCOL 3350 17 G PO PACK
17.0000 g | PACK | Freq: Two times a day (BID) | ORAL | Status: DC
  Administered 2020-10-21: 17 g via ORAL
  Filled 2020-10-21: qty 1

## 2020-10-21 NOTE — Progress Notes (Signed)
Lower extremity venous has been completed.   Preliminary results in CV Proc.   Abram Sander 10/21/2020 5:20 PM

## 2020-10-21 NOTE — Discharge Summary (Signed)
Physician Discharge Summary  Annette James YHT:[id number] DOB: 09-05-1961 DOA: 10/18/2020  PCP: Patient, No Pcp Per  Admit date: 10/18/2020 Discharge date: 10/21/2020  Admitted From: home Disposition:  home  Recommendations for Outpatient Follow-up:  1. Follow up with PCP in 1-2 weeks 2. Please obtain BMP/CBC in one week 3. Mammogram - ordered with biopsies, needs hormonal studies 4. PET scan, not ordered but needs this 5. Oncology follow-up 6. BP check, Amlodipine started during hospitalization.  Home Health: No Equipment/Devices:none   Discharge Condition: Stable CODE STATUS: Full code Diet recommendation: DASH, heart healthy  Brief/Interim Summary: Patient is an unfortunate 60 year old female who was admitted with dizziness and vertigo.  Work-up reveals a centimeter right breast mass and multiple lytic lesions in the calvarium and C-spine.  She is undergoing a metastatic work-up.  Discharge Diagnoses:  Principal Problem:   Metastatic cancer to spine [facility]) Active Problems:   Labyrinthitis   Lytic bone lesions on xray   Breast mass, right   Goals of care, counseling/discussion   Metastatic cancer ([place])  Right breast mass Patient being followed by oncology and is undergoing a work-up for possible breast CA versus other causes of lytic skeletal lesions. Bone survey ordered 24-hour urine and SPEP to rule out MM She will need a mammogram and biopsy of the breast mass Zometa ordered per oncology  Headache On as needed oxycodone and Tylenol  IDA Received Feraheme 10/19/2020  HTN She is not on any antihypertensives at home This can be addressed as an outpatient or she can started on a beta-blocker as warranted given relatively elevated resting heart rate and this might help with anxiety as well.  Discharge Instructions:  Discharge Instructions    Diet - low sodium heart healthy   Complete by: As directed    Increase activity slowly   Complete by: As directed       Allergies as of 10/21/2020   No Known Allergies     Medication List    TAKE these medications   amLODipine 5 MG tablet Commonly known as: NORVASC Take 1 tablet (5 mg total) by mouth daily.   ascorbic acid 500 MG tablet Commonly known as: VITAMIN C Take 500 mg by mouth daily.   fentaNYL 25 MCG/HR Commonly known as: [place] 1 patch onto the skin every 3 (three) days. Start taking on: October 24, 2020   metaxalone 400 MG tablet Commonly known as: SKELAXIN Take 1 tablet (400 mg total) by mouth 3 (three) times daily.   One-A-Day Womens 50 Plus Tabs Take 1 tablet by mouth daily.   Oxycodone HCl 10 MG Tabs Take 1 tablet (10 mg total) by mouth every 6 (six) hours as needed for moderate pain.       Follow-up Information    [facility]. Go on 10/30/2020.   Why:  post hospital follow up and establish primary care with Dr.Thomas Jones on 10/31/2019 at 11:00 am Contact information:   [address] [place], [place] [address]  [place]: [phone] Fax: [phone] Hours (M-F): 8am - 5pm       Volanda Napoleon, MD Follow up.   Specialty: Oncology Contact information: [place] [address] [phone]              No Known Allergies  Consultations:  Oncology   Procedures/Studies: DG Ribs Unilateral W/Chest Right  Result Date: 10/18/2020 CLINICAL DATA:  Pain after fall. EXAM: RIGHT RIBS AND CHEST - 3+ VIEW  COMPARISON:  None. FINDINGS: No fracture or other bone lesions are seen involving the ribs. There is no evidence of pneumothorax or pleural effusion. Both lungs are clear. Heart size and mediastinal contours are within normal limits. IMPRESSION: Negative. Electronically Signed   By: Dorise Bullion III M.D   On: 10/18/2020 19:02   CT Head Wo Contrast  Result Date: 10/18/2020 CLINICAL DATA:  Status post fall, dizziness. Discussion with PA, no known malignancy. EXAM: CT HEAD WITHOUT CONTRAST  CT CERVICAL SPINE WITHOUT CONTRAST TECHNIQUE: Multidetector CT imaging of the head and cervical spine was performed following the standard protocol without intravenous contrast. Multiplanar CT image reconstructions of the cervical spine were also generated. COMPARISON:  None. FINDINGS: CT HEAD FINDINGS Brain: No evidence of large-territorial acute infarction. No parenchymal hemorrhage. No mass lesion. No extra-axial collection. No mass effect or midline shift. No hydrocephalus. Basilar cisterns are patent. Vascular: No hyperdense vessel. Skull: Suggestion of several calvarial lytic lesions. No acute fracture or focal lesion. Sinuses/Orbits: Paranasal sinuses and mastoid air cells are clear. The orbits are unremarkable. Other: None. CT CERVICAL SPINE FINDINGS Alignment: Normal. Skull base and vertebrae: No acute fracture. Innumerable lytic lesions of the cervical spine involving the vertebral bodies as well as posterior elements. Degenerative changes of the spine and the C5-C6 level. Soft tissues and spinal canal: No prevertebral fluid or swelling. No visible canal hematoma. Disc levels:  Maintained. Upper chest: Unremarkable. Other: None. IMPRESSION: 1. 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 No definite associated pathologic fracture of the cervical spine. Recommend workup for malignancy. 2. No acute intracranial abnormality. Please note markedly limited evaluation for intracranial metastases. Consider MRI brain with and without contrast for further evaluation given above findings. 3. No acute displaced fracture or traumatic listhesis of the cervical spine. These results were called by telephone at the time of interpretation on 10/18/2020 at 7:26 pm to provider PA [facility] LAYDEN , who verbally acknowledged these results. Electronically Signed   By: Iven Finn M.D.   On: 10/18/2020 19:31   CT Cervical Spine Wo Contrast  Result Date: 10/18/2020 CLINICAL DATA:  Status post fall,  dizziness. Discussion with PA, no known malignancy. EXAM: CT HEAD WITHOUT CONTRAST CT CERVICAL SPINE WITHOUT CONTRAST TECHNIQUE: Multidetector CT imaging of the head and cervical spine was performed following the standard protocol without intravenous contrast. Multiplanar CT image reconstructions of the cervical spine were also generated. COMPARISON:  None. FINDINGS: CT HEAD FINDINGS Brain: No evidence of large-territorial acute infarction. No parenchymal hemorrhage. No mass lesion. No extra-axial collection. No mass effect or midline shift. No hydrocephalus. Basilar cisterns are patent. Vascular: No hyperdense vessel. Skull: Suggestion of several calvarial lytic lesions. No acute fracture or focal lesion. Sinuses/Orbits: Paranasal sinuses and mastoid air cells are clear. The orbits are unremarkable. Other: None. CT CERVICAL SPINE FINDINGS Alignment: Normal. Skull base and vertebrae: No acute fracture. Innumerable lytic lesions of the cervical spine involving the vertebral bodies as well as posterior elements. Degenerative changes of the spine and the C5-C6 level. Soft tissues and spinal canal: No prevertebral fluid or swelling. No visible canal hematoma. Disc levels:  Maintained. Upper chest: Unremarkable. Other: None. IMPRESSION: 1. 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 No definite associated pathologic fracture of the cervical spine. Recommend workup for malignancy. 2. No acute intracranial abnormality. Please note markedly limited evaluation for intracranial metastases. Consider MRI brain with and without contrast for further evaluation given above findings. 3. No acute displaced fracture or traumatic listhesis of the  cervical spine. These results were called by telephone at the time of interpretation on 10/18/2020 at 7:26 pm to provider PA [facility] LAYDEN , who verbally acknowledged these results. Electronically Signed   By: Iven Finn M.D.   On: 10/18/2020 19:31   MR BRAIN  W WO CONTRAST  Result Date: 10/19/2020 CLINICAL DATA:  Initial evaluation for metastatic disease. EXAM: MRI HEAD WITHOUT AND WITH CONTRAST TECHNIQUE: Multiplanar, multiecho pulse sequences of the brain and surrounding structures were obtained without and with intravenous contrast. CONTRAST:  43m GADAVIST GADOBUTROL 1 MMOL/ML IV SOLN COMPARISON:  Prior CT from 10/18/2020. FINDINGS: Brain: Cerebral volume within normal limits for age. Mild scattered patchy T2/FLAIR hyperintensity within the periventricular, deep, and subcortical white matter both cerebral hemispheres, nonspecific, but most commonly related to chronic microvascular ischemic disease, mild for age. No abnormal foci of restricted diffusion to suggest acute or subacute ischemia. Gray-white matter differentiation maintained. No encephalomalacia to suggest chronic cortical infarction. No acute or chronic intracranial hemorrhage. No mass lesion, midline shift or mass effect. No hydrocephalus or extra-axial fluid collection. Pituitary gland and suprasellar region within normal limits. Midline structures intact. No abnormal enhancement within the brain. No evidence for intracranial metastatic disease within the brain itself. Vascular: Major intracranial vascular flow voids are maintained. Skull and upper cervical spine: Craniocervical junction normal. Diffusely abnormal signal intensity seen throughout the visualized bone marrow with multiple scattered lesions seen throughout the calvarium and visualized upper cervical spine, consistent with osseous metastases and/or myeloma. Most prominent of these present at the left frontal calvarium and measures 1.7 cm (series 11, image 63). No significant extra osseous extension of disease. No scalp soft tissue abnormality. Sinuses/Orbits: Globes and orbital soft tissues within normal limits. Mild scattered mucosal thickening noted within the paranasal sinuses. No air-fluid levels to suggest acute sinusitis. Trace  bilateral mastoid effusions, of doubtful significance. Other: None. IMPRESSION: 1. Multiple scattered lesions throughout the calvarium and upper cervical spine, consistent with osseous metastases and/or myeloma. No extra osseous extension of disease. 2. No other acute intracranial abnormality. No evidence for intracranial metastatic disease within the brain itself. 3. Mild cerebral white matter disease, nonspecific, but most commonly related to chronic microvascular ischemic disease. Electronically Signed   By: BJeannine BogaM.D.   On: 10/19/2020 04:20   MR CERVICAL SPINE W WO CONTRAST  Result Date: 10/19/2020 CLINICAL DATA:  Metastatic cancer of unknown primary, abnormal cervical spine CT, staging EXAM: MRI CERVICAL SPINE WITHOUT AND WITH CONTRAST TECHNIQUE: Multiplanar and multiecho pulse sequences of the cervical spine, to include the craniocervical junction and cervicothoracic junction, were obtained without and with intravenous contrast. CONTRAST:  16mGADAVIST GADOBUTROL 1 MMOL/ML IV SOLN COMPARISON:  Correlation made with CT 10/18/2020 FINDINGS: Alignment: No significant listhesis. Vertebrae: Abnormal marrow signal throughout visualized osseous structures including the cervical spine with areas of enhancement. Minor extraosseous extension is present. No significant epidural disease. Cord: No abnormal signal.  No abnormal intrathecal enhancement. Posterior Fossa, vertebral arteries, paraspinal tissues: Intracranial findings better evaluated on same day dedicated imaging. Trace prevertebral edema. Otherwise unremarkable. Disc levels: C2-C3:  No canal or foraminal stenosis. C3-C4: Punctate central annular fissure. No canal or foraminal stenosis. C4-C5:  No canal or foraminal stenosis. C5-C6: Disc bulge with endplate osteophytes. Minor canal stenosis. Minor foraminal stenosis. C6-C7: Disc bulge with endplate osteophytes. No canal stenosis. No right foraminal stenosis. Minor right and mild left  foraminal stenosis. C7-T1: Right foraminal disc protrusion with endplate osteophytes. No canal or left foraminal stenosis. Mild right foraminal  stenosis. IMPRESSION: Multifocal osseous metastatic disease. No significant epidural disease. Electronically Signed   By: Macy Mis M.D.   On: 10/19/2020 12:53   MR THORACIC SPINE W WO CONTRAST  Result Date: 10/20/2020 CLINICAL DATA:  Metastatic breast cancer. EXAM: MRI THORACIC AND LUMBAR SPINE WITHOUT AND WITH CONTRAST TECHNIQUE: Multiplanar and multiecho pulse sequences of the thoracic and lumbar spine were obtained without and with intravenous contrast. CONTRAST:  58m GADAVIST GADOBUTROL 1 MMOL/ML IV SOLN COMPARISON:  CT chest, abdomen, and pelvis dated October 19, 2020. FINDINGS: MRI THORACIC SPINE FINDINGS Alignment:  Physiologic. Vertebrae: Innumerable enhancing lesions throughout the visualized thoracic spine and ribs. Chronic mild pathologic fractures of the T8 and T11 superior endplates. No epidural tumor. Cord:  Normal signal and morphology.  No intradural enhancement. Paraspinal and other soft tissues: Trace bilateral pleural effusions. Disc levels: Mild degenerative disc disease from T5-T6 through T11-T12. No spinal canal or neuroforaminal stenosis at any level. MRI LUMBAR SPINE FINDINGS Segmentation:  Standard. Alignment:  Physiologic. Vertebrae: Innumerable enhancing lesions throughout the visualized lumbosacral spine as well as the bilateral iliac bones. Chronic mild pathologic fracture of the S1 superior endplate. No epidural tumor. Conus medullaris: Extends to the L2 level and appears normal. No intradural enhancement. Paraspinal and other soft tissues: Negative. Disc levels: Mild degenerative disc disease at T12-L1 and L2-L3. Central disc protrusion at T6-T7 flattens the ventral cord. No spinal canal or neuroforaminal stenosis at any level. IMPRESSION: 1. Widespread osseous metastatic disease involving the thoracic and lumbosacral spine. No  significant epidural tumor or cord compromise. 2. Chronic mild pathologic fractures of the T8, T11, and S1 superior endplates. 3. Trace bilateral pleural effusions. Electronically Signed   By: WTitus DubinM.D.   On: 10/20/2020 15:40   MR Lumbar Spine W Wo Contrast  Result Date: 10/20/2020 CLINICAL DATA:  Metastatic breast cancer. EXAM: MRI THORACIC AND LUMBAR SPINE WITHOUT AND WITH CONTRAST TECHNIQUE: Multiplanar and multiecho pulse sequences of the thoracic and lumbar spine were obtained without and with intravenous contrast. CONTRAST:  136mGADAVIST GADOBUTROL 1 MMOL/ML IV SOLN COMPARISON:  CT chest, abdomen, and pelvis dated October 19, 2020. FINDINGS: MRI THORACIC SPINE FINDINGS Alignment:  Physiologic. Vertebrae: Innumerable enhancing lesions throughout the visualized thoracic spine and ribs. Chronic mild pathologic fractures of the T8 and T11 superior endplates. No epidural tumor. Cord:  Normal signal and morphology.  No intradural enhancement. Paraspinal and other soft tissues: Trace bilateral pleural effusions. Disc levels: Mild degenerative disc disease from T5-T6 through T11-T12. No spinal canal or neuroforaminal stenosis at any level. MRI LUMBAR SPINE FINDINGS Segmentation:  Standard. Alignment:  Physiologic. Vertebrae: Innumerable enhancing lesions throughout the visualized lumbosacral spine as well as the bilateral iliac bones. Chronic mild pathologic fracture of the S1 superior endplate. No epidural tumor. Conus medullaris: Extends to the L2 level and appears normal. No intradural enhancement. Paraspinal and other soft tissues: Negative. Disc levels: Mild degenerative disc disease at T12-L1 and L2-L3. Central disc protrusion at T6-T7 flattens the ventral cord. No spinal canal or neuroforaminal stenosis at any level. IMPRESSION: 1. Widespread osseous metastatic disease involving the thoracic and lumbosacral spine. No significant epidural tumor or cord compromise. 2. Chronic mild pathologic  fractures of the T8, T11, and S1 superior endplates. 3. Trace bilateral pleural effusions. Electronically Signed   By: WiTitus Dubin.D.   On: 10/20/2020 15:40   CT CHEST ABDOMEN PELVIS W CONTRAST  Result Date: 10/19/2020 CLINICAL DATA:  Breast cancer staging. EXAM: CT CHEST, ABDOMEN, AND PELVIS WITH CONTRAST  TECHNIQUE: Multidetector CT imaging of the chest, abdomen and pelvis was performed following the standard protocol during bolus administration of intravenous contrast. CONTRAST:  127m OMNIPAQUE IOHEXOL 300 MG/ML  SOLN COMPARISON:  October 18, 2020. FINDINGS: CT CHEST FINDINGS Cardiovascular: No significant vascular findings. Normal heart size. Trace pericardial effusion. Mediastinum/Nodes: No axillary adenopathy. No suspicious internal mammary lymph nodes. No mediastinal adenopathy. No hilar adenopathy. Visualized thyroid is unremarkable. Lungs/Pleura: No pneumothorax. Trace RIGHT subpulmonic effusion. Bibasilar atelectasis. There are scattered pulmonary nodules with representative nodules as follows: Nodule 1: RIGHT middle lobe pulmonary nodule adjacent to the fissure measures 3 mm (series 5, image 67). Nodule 2: RIGHT upper lobe subpleural pulmonary nodule measures 3 mm (series 5, image 29). Musculoskeletal: Within the RIGHT upper breast, there is an irregular mass which measures 3.1 by 3.1 by 3.3 cm (series 3, image 21). There are innumerable lytic and sclerotic lesions throughout the axial and appendicular skeleton. They involve every single vertebral body level, the sternum, scapula, clavicles, humeral heads and multifocal bilateral ribs. There is a moderate pathologic compression fracture deformity of T8 and a mild pathologic compression fracture deformity of T11. Lesion involving the posterior aspect of T11 extends to the epidural space (series 3, image 48). There are pathologic fractures through several of the rib lesions. CT ABDOMEN PELVIS FINDINGS Hepatobiliary: No focal liver abnormality is  seen. Status post cholecystectomy. No biliary dilatation. Pancreas: Unremarkable. No pancreatic ductal dilatation or surrounding inflammatory changes. Spleen: Normal in size without focal abnormality.  Splenule. Adrenals/Urinary Tract: Adrenal glands are unremarkable. Kidneys are normal, without renal calculi, focal lesion, or hydronephrosis. Bladder is unremarkable. Stomach/Bowel: Stomach is unremarkable. No evidence of bowel obstruction. No evidence of bowel inflammation. Status post appendectomy. Vascular/Lymphatic: No significant aortic atherosclerosis. No suspicious intra-abdominal lymph nodes. Reproductive: Fibroid uterus. There is a cystic structure of the LEFT ovary versus 2 adjacent cystic structures. It spans 5.2 x 2.7 by 3.7 cm (series 3, image 101). RIGHT adnexa is unremarkable. Other: No free air or free fluid. Musculoskeletal: Innumerable lytic and sclerotic lesions throughout the entirety of the lumbar spine, pelvis and bilateral femurs. Expansile lesion of the LEFT iliac crest. There is a possible pathologic fracture through the RIGHT sacrum (series 7, image 66). A lesion of the sacrum likely extends through the vertebral body into the residual spinal canal. Possible pathologic fracture of the RIGHT inferior pubic ramus (series 3, image 119). IMPRESSION: 1. Irregular mass in the RIGHT upper breast measuring up to 3.3 cm, consistent with primary breast malignancy. 2. Innumerable lytic and sclerotic lesions throughout the near entirety of the axial and appendicular skeleton, worrisome for diffuse osseous metastatic disease. There are pathologic compression fracture deformities of T8 and T11. 3. There is a possible nondisplaced pathologic fracture through the RIGHT sacrum. There is a possible pathologic fracture of the RIGHT inferior pubic ramus. 4. A metastatic lesion of T11 extends through the posterior aspect of the vertebral body and possibly into the epidural space. A lesion in the sacrum extends  through the posterior aspect of the bone. If concern for epidural extension, consider further evaluation with dedicated contrast enhanced MRI. 5. Scattered pulmonary nodules measuring up to 3 mm, nonspecific, but favored benign. 6. There is a 5.2 cm LEFT ovarian cystic mass. Recommend further evaluation with dedicated pelvic ultrasound when clinically appropriate. 7. Trace pericardial effusion. Electronically Signed   By: SValentino SaxonMD   On: 10/19/2020 20:35   DG Bone Survey Met  Result Date: 10/19/2020 CLINICAL DATA:  Lytic lesion  of bone on x-ray. EXAM: METASTATIC BONE SURVEY COMPARISON:  None. FINDINGS: Several lytic lesions are seen throughout the calvarium consistent with recent CT findings. There appears to be a subtle lytic lesion in the lateral left humeral head. A lytic lesion is suspected in the left coracoid and left distal clavicle. No definitive lytic lesions in the right humerus. No definitive lytic lesions in the left radius or ulna. No definitive lytic lesions in the right radius or ulna. Known lytic lesions are seen in the cervical spine at multiple levels, consistent with recent CT findings. The findings are much more dramatic on CT imaging. Anterior wedging is seen of a lower thoracic vertebral body. While evaluation is limited, multiple lytic lesions are suspected in the thoracic spine. While subtle, I do suspect scattered lytic lesions in the lumbar spine. Multiple lytic lesions are seen in the ribs. This is extremely subtle on frontal views but apparent on the lumbar spine lateral views. Lytic lesions seen throughout the pelvic bones and proximal femurs. No definitive lytic lesions in the tibias or fibulas bilaterally. IMPRESSION: Widespread bony lytic lesions consistent with lytic metastases versus multiple myeloma. Electronically Signed   By: Dorise Bullion III M.D   On: 10/19/2020 11:36   DG Hip Unilat With Pelvis 2-3 Views Left  Result Date: 10/18/2020 CLINICAL DATA:  [place]  down steps this morning.  Bilateral hip pain. EXAM: DG HIP (WITH OR WITHOUT PELVIS) 2-3V LEFT COMPARISON:  None. FINDINGS: No fracture. Subtle lucencies are noted on the AP pelvis view, which are not well-defined and some of which are overlying bowel gas. No definite osteolytic lesions and no evidence of an osteoblastic lesion. Right SI joint is narrowed with bordering sclerosis. The left SI joint, both hip joints and the pubic symphysis are normally spaced and aligned with no significant arthropathic change. Soft tissues are unremarkable. IMPRESSION: 1. No fracture or acute finding. 2. Right SI joint degenerative/arthropathic change. Electronically Signed   By: Lajean Manes M.D.   On: 10/18/2020 08:46   DG Hip Unilat  With Pelvis 2-3 Views Right  Result Date: 10/18/2020 CLINICAL DATA:  [place] down steps this morning.  Bilateral hip pain. EXAM: DG HIP (WITH OR WITHOUT PELVIS) 2-3V RIGHT COMPARISON:  None. FINDINGS: No fracture. Patchy sclerosis is noted along the right SI joint. SI joint is normally aligned but mildly narrowed. Right hip joint is normally spaced and aligned. SI joint is normally aligned. Soft tissues are unremarkable. IMPRESSION: 1. No fracture or dislocation. 2. Right SI joint arthropathic changes. Well maintained right hip joint. Electronically Signed   By: Lajean Manes M.D.   On: 10/18/2020 08:43   VAS [place] LOWER EXTREMITY VENOUS (DVT)  Result Date: 10/21/2020  Lower Venous DVT Study Indications: Swelling, and Edema.  Comparison Study: no prior Performing Technologist: Abram Sander RVS  Examination Guidelines: A complete evaluation includes B-mode imaging, spectral Doppler, color Doppler, and power Doppler as needed of all accessible portions of each vessel. Bilateral testing is considered an integral part of a complete examination. Limited examinations for reoccurring indications may be performed as noted. The reflux portion of the exam is performed with the patient in reverse  Trendelenburg.  +---------+---------------+---------+-----------+----------+--------------+ RIGHT    CompressibilityPhasicitySpontaneityPropertiesThrombus Aging +---------+---------------+---------+-----------+----------+--------------+ CFV      Full           Yes      Yes                                 +---------+---------------+---------+-----------+----------+--------------+  SFJ      Full                                                        +---------+---------------+---------+-----------+----------+--------------+ FV Prox  Full                                                        +---------+---------------+---------+-----------+----------+--------------+ FV Mid   Full                                                        +---------+---------------+---------+-----------+----------+--------------+ FV DistalFull                                                        +---------+---------------+---------+-----------+----------+--------------+ PFV      Full                                                        +---------+---------------+---------+-----------+----------+--------------+ POP      Full           Yes      Yes                                 +---------+---------------+---------+-----------+----------+--------------+ PTV      Full                                                        +---------+---------------+---------+-----------+----------+--------------+ PERO     Full                                                        +---------+---------------+---------+-----------+----------+--------------+   +----+---------------+---------+-----------+----------+--------------+ LEFTCompressibilityPhasicitySpontaneityPropertiesThrombus Aging +----+---------------+---------+-----------+----------+--------------+ CFV Full           Yes      Yes                                  +----+---------------+---------+-----------+----------+--------------+     Summary: RIGHT: - There is no evidence of deep vein thrombosis in the lower extremity.  - No cystic structure found in the popliteal fossa.  LEFT: - No evidence of common femoral vein obstruction.  *See table(s) above for measurements and observations.    Preliminary  Subjective: Feels well, some mild calf tenderness. Negative DVT w/u today  Discharge Exam: Vitals:   10/21/20 0320 10/21/20 0852  BP: (!) 185/89 (!) 163/80  Pulse: 99 97  Resp: 16 18  Temp: 99 F (37.2 C) 98.4 F (36.9 C)  SpO2: 96% 97%   Vitals:   10/20/20 1500 10/20/20 1955 10/21/20 0320 10/21/20 0852  BP: (!) 169/98 (!) 172/82 (!) 185/89 (!) 163/80  Pulse: (!) 104 94 99 97  Resp: '18 16 16 18  ' Temp: 98.9 F (37.2 C) 98.9 F (37.2 C) 99 F (37.2 C) 98.4 F (36.9 C)  TempSrc: Oral Oral Oral Oral  SpO2: 93% 98% 96% 97%  Weight:      Height:        General: Pt is alert, awake, not in acute distress Cardiovascular: RRR, S1/S2 +, no rubs, no gallops Respiratory: CTA bilaterally, no wheezing, no rhonchi Abdominal: Soft, NT, ND, bowel sounds + Extremities: no edema, no cyanosis    The results of significant diagnostics from this hospitalization (including imaging, microbiology, ancillary and laboratory) are listed below for reference.     Microbiology: Recent Results (from the past 240 hour(s))  Resp Panel by RT-PCR (Flu A&B, Covid) Nasopharyngeal Swab     Status: None   Collection Time: 10/18/20  8:35 PM   Specimen: Nasopharyngeal Swab; Nasopharyngeal(NP) swabs in vial transport medium  Result Value Ref Range Status   SARS Coronavirus 2 by RT PCR NEGATIVE NEGATIVE Final    Comment: (NOTE) SARS-CoV-2 target nucleic acids are NOT DETECTED.  The SARS-CoV-2 RNA is generally detectable in upper respiratory specimens during the acute phase of infection. The lowest concentration of SARS-CoV-2 viral copies this assay can  detect is 138 copies/mL. A negative result does not preclude SARS-Cov-2 infection and should not be used as the sole basis for treatment or other patient management decisions. A negative result may occur with  improper specimen collection/handling, submission of specimen other than nasopharyngeal swab, presence of viral mutation(s) within the areas targeted by this assay, and inadequate number of viral copies(<138 copies/mL). A negative result must be combined with clinical observations, patient history, and epidemiological information. The expected result is Negative.  Fact Sheet for Patients:  EntrepreneurPulse.com.au  Fact Sheet for Healthcare Providers:  IncredibleEmployment.be  This test is no t yet approved or cleared by the [place] FDA and  has been authorized for detection and/or diagnosis of SARS-CoV-2 by FDA under an Emergency Use Authorization (EUA). This EUA will remain  in effect (meaning this test can be used) for the duration of the COVID-19 declaration under Section 564(b)(1) of the Act, 21 U.S.C.section 360bbb-3(b)(1), unless the authorization is terminated  or revoked sooner.       Influenza A by PCR NEGATIVE NEGATIVE Final   Influenza B by PCR NEGATIVE NEGATIVE Final    Comment: (NOTE) The Xpert Xpress SARS-CoV-2/FLU/RSV plus assay is intended as an aid in the diagnosis of influenza from Nasopharyngeal swab specimens and should not be used as a sole basis for treatment. Nasal washings and aspirates are unacceptable for Xpert Xpress SARS-CoV-2/FLU/RSV testing.  Fact Sheet for Patients: EntrepreneurPulse.com.au  Fact Sheet for Healthcare Providers: IncredibleEmployment.be  This test is not yet approved or cleared by the [place] FDA and has been authorized for detection and/or diagnosis of SARS-CoV-2 by FDA under an Emergency Use Authorization (EUA). This EUA will remain in  effect (meaning this test can be used) for the duration of the COVID-19 declaration under Section 564(b)(1) of the  Act, 21 U.S.C. section 360bbb-3(b)(1), unless the authorization is terminated or revoked.  Performed at [facility], [place] [address], [place], [place] [address]      Labs: Basic Metabolic Panel: Recent Labs  Lab 10/18/20 0819 10/19/20 0400 10/20/20 0235  NA 140 139 139  K 3.4* 3.4* 3.5  CL 100 101 104  CO2 '26 25 25  ' GLUCOSE 122* 135* 100*  BUN '14 18 18  ' CREATININE 0.94 0.76 0.89  CALCIUM 10.7* 10.0 10.0   Liver Function Tests: Recent Labs  Lab 10/20/20 0235  AST 34  ALT 23  ALKPHOS 297*  BILITOT 1.0  PROT 6.4*  ALBUMIN 2.1*   CBC: Recent Labs  Lab 10/18/20 0819 10/19/20 0400  WBC 15.9* 14.6*  NEUTROABS  --  11.7*  HGB 10.9* 9.2*  HCT 36.2 31.1*  MCV 77.7* 77.4*  PLT 519* 421*   Anemia work up Recent Labs    10/19/20 0746  FERRITIN 1,156*  TIBC 213*  IRON 32   Urinalysis    Component Value Date/Time   COLORURINE AMBER (A) 10/18/2020 0819   APPEARANCEUR CLOUDY (A) 10/18/2020 0819   LABSPEC 1.028 10/18/2020 0819   PHURINE 5.0 10/18/2020 0819   GLUCOSEU NEGATIVE 10/18/2020 0819   HGBUR NEGATIVE 10/18/2020 0819   BILIRUBINUR NEGATIVE 10/18/2020 0819   KETONESUR 5 (A) 10/18/2020 0819   PROTEINUR 100 (A) 10/18/2020 0819   NITRITE NEGATIVE 10/18/2020 0819   LEUKOCYTESUR NEGATIVE 10/18/2020 0819   Sepsis Labs Microbiology Recent Results (from the past 240 hour(s))  Resp Panel by RT-PCR (Flu A&B, Covid) Nasopharyngeal Swab     Status: None   Collection Time: 10/18/20  8:35 PM   Specimen: Nasopharyngeal Swab; Nasopharyngeal(NP) swabs in vial transport medium  Result Value Ref Range Status   SARS Coronavirus 2 by RT PCR NEGATIVE NEGATIVE Final    Comment: (NOTE) SARS-CoV-2 target nucleic acids are NOT DETECTED.  The SARS-CoV-2 RNA is generally detectable in upper respiratory specimens during the acute phase of infection. The  lowest concentration of SARS-CoV-2 viral copies this assay can detect is 138 copies/mL. A negative result does not preclude SARS-Cov-2 infection and should not be used as the sole basis for treatment or other patient management decisions. A negative result may occur with  improper specimen collection/handling, submission of specimen other than nasopharyngeal swab, presence of viral mutation(s) within the areas targeted by this assay, and inadequate number of viral copies(<138 copies/mL). A negative result must be combined with clinical observations, patient history, and epidemiological information. The expected result is Negative.  Fact Sheet for Patients:  EntrepreneurPulse.com.au  Fact Sheet for Healthcare Providers:  IncredibleEmployment.be  This test is no t yet approved or cleared by the [place] FDA and  has been authorized for detection and/or diagnosis of SARS-CoV-2 by FDA under an Emergency Use Authorization (EUA). This EUA will remain  in effect (meaning this test can be used) for the duration of the COVID-19 declaration under Section 564(b)(1) of the Act, 21 U.S.C.section 360bbb-3(b)(1), unless the authorization is terminated  or revoked sooner.       Influenza A by PCR NEGATIVE NEGATIVE Final   Influenza B by PCR NEGATIVE NEGATIVE Final    Comment: (NOTE) The Xpert Xpress SARS-CoV-2/FLU/RSV plus assay is intended as an aid in the diagnosis of influenza from Nasopharyngeal swab specimens and should not be used as a sole basis for treatment. Nasal washings and aspirates are unacceptable for Xpert Xpress SARS-CoV-2/FLU/RSV testing.  Fact Sheet for Patients: EntrepreneurPulse.com.au  Fact Sheet for Healthcare Providers: IncredibleEmployment.be  This test is not yet approved or cleared by the [place] FDA and has been authorized for detection and/or diagnosis of SARS-CoV-2 by FDA under  an Emergency Use Authorization (EUA). This EUA will remain in effect (meaning this test can be used) for the duration of the COVID-19 declaration under Section 564(b)(1) of the Act, 21 U.S.C. section 360bbb-3(b)(1), unless the authorization is terminated or revoked.  Performed at [facility], [place] [address], [place], [place] [address]      Time coordinating discharge: Over 30 minutes  SIGNED:   Donnamae Jude, MD  [facility] 10/21/2020, 5:52 PM  If 7PM-7AM, please contact night-coverage

## 2020-10-21 NOTE — Progress Notes (Signed)
Pt feeling a lot better. Discharge instructions given to pt. Discharged home accompanied by daughter.

## 2020-10-21 NOTE — Progress Notes (Signed)
Annette James is doing little bit better.  She still is having some issues with head and neck pain.  We did do a MRI of the lumbar and thoracic spine.  There is no cord compression issues.  She does have extensive spinal involvement spine tumor.  She will need some radiation therapy in my opinion to the spine.  We are still awaiting the CA 27.29 to come back.  She is complaining of some crampiness in the right leg.  I am do not think there is any swelling.  However, I think it probably would not be a bad idea to check her for a DVT.  She certainly is at significant risk for a DVT.  As far as chronic pain management is concerned, I think she will need something that is going to be long-acting.  I will go ahead and see about getting her on a Duragesic patch.  I think this would be reasonable.  She can then use oxycodone for breakthrough pain.  Hopefully, she will be able to go home today.  She will clearly need a mammogram and breast biopsy as an outpatient.  This needs to be done quickly.  We really need to get the biopsy and sent off for estrogen and HER2 studies.  I really do not think she needs to have a breast MRI.  She is not a surgical candidate for lumpectomy.  Also as an outpatient, I probably will get a pet scan on her.  We need to make sure she does not get constipated.  She has not had a bowel movement in the hospital.  I would make sure she is on something chronically.  I will put her on MiraLAX and have her take twice a day dosing.  Again, I cannot imagine that this is anything other than metastatic breast cancer.  Again we will have to await the result of the CA 27.29.  She only has bony metastasis by her scans.  On the CT scan, there is some nonspecific pulmonary nodules that are less than 5 mm in size.  She has a mass in the right breast measuring 3.1 x 3.1 x 3.3 cm.  Of note, she is anemic.  She is iron deficient anemic.  We gave her some iron..  I did give her a dose of Zometa  on Saturday.  This will help with her bony lesions.  We are clearly dealing with a situation that is not curable.  If this is metastatic breast cancer, and she is estrogen positive, we should be able to get a good lengthy quantity of life and quality of life.  For right now, we will have to move ahead with her work-up.  Again hopefully, she will be an outpatient today.  She must get the mammogram and breast biopsy done this week.  I know that she has had fantastic care from all of staff upon [address].  I appreciate all of their compassion.  Lattie Haw, MD  [facility]ter 2:8-9

## 2020-10-21 NOTE — TOC Initial Note (Signed)
Transition of Care [facility] - Initial/Assessment Note    Patient Details  Name: Annette James MRN: [id number] Date of Birth: 1961-09-28  Transition of [facility]) CM/SW Contact:    Sharin Mons, RN Phone Number: 10/21/2020, 1:56 PM  Clinical Narrative:                 Pt presents s/p fall, dizziness. Found to have a mass in the right breast/ lytic lesions in the scalp and on the cervical spine  From home with daughters. Recently moved to [facility].....has been in  [place] x 1 year.  NCM spoke with pt in regard to d/c planning. Pt without PCP, understands the need for PCP.  NCM scheduled  Post hospital f/u appointment and to establish primary care with [facility]  @ [facility], noted on AVS.  [facility] team following for needs....  Expected Discharge Plan: Home/Self Care Barriers to Discharge: Continued Medical Work up   Patient Goals and CMS Choice Patient states their goals for this hospitalization and ongoing recovery are:: to get home and feel better      Expected Discharge Plan and Services Expected Discharge Plan: Home/Self Care   Discharge Planning Services: CM Consult   Living arrangements for the past 2 months: Single Family Home                                      Prior Living Arrangements/Services Living arrangements for the past 2 months: Single Family Home Lives with:: Adult Children Patient language and need for interpreter reviewed:: Yes Do you feel safe going back to the place where you live?: Yes      Need for Family Participation in Patient Care: Yes (Comment) Care giver support system in place?: Yes (comment)   Criminal Activity/Legal Involvement Pertinent to Current Situation/Hospitalization: No - Comment as needed  Activities of Daily Living Home Assistive Devices/Equipment: None ADL Screening (condition at time of admission) Patient's cognitive ability adequate to safely complete daily activities?: Yes Is the patient deaf or have difficulty  hearing?: No Does the patient have difficulty seeing, even when wearing glasses/contacts?: No Does the patient have difficulty concentrating, remembering, or making decisions?: No Patient able to express need for assistance with ADLs?: Yes Does the patient have difficulty dressing or bathing?: No Independently performs ADLs?: Yes (appropriate for developmental age) Does the patient have difficulty walking or climbing stairs?: No Weakness of Legs: Both Weakness of Arms/Hands: Both  Permission Sought/Granted                  Emotional Assessment Appearance:: Appears stated age Attitude/Demeanor/Rapport: Gracious Affect (typically observed): Accepting Orientation: : Oriented to Place,Oriented to Self,Oriented to  Time,Oriented to Situation Alcohol / Substance Use: Not Applicable Psych Involvement: No (comment)  Admission diagnosis:  Dizziness [R42] Abnormal CT scan, neck [R93.89] Lytic bone lesions on xray [M89.9] Metastatic cancer [facility]) [C79.9] Patient Active Problem List   Diagnosis Date Noted  . Breast mass, right 10/19/2020  . Goals of care, counseling/discussion 10/19/2020  . 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 Metastatic cancer ([place]) 10/19/2020  . Labyrinthitis 10/18/2020  . Lytic bone lesions on xray 10/18/2020   PCP:  Patient, No Pcp Per Pharmacy:   [id number] - [place], [place] [address] [place] [address] Phone: [phone] Fax: [phone]     Social Determinants of Health (SDOH) Interventions    Readmission  Risk Interventions No flowsheet data found.

## 2020-10-21 NOTE — Discharge Instructions (Signed)
Bone Metastasis  Bone metastasis is cancer that has spread from the part of the body where it started to the bones. A person may have bone metastasis in one bone or in more than one bone. Cancer that spreads to the bones is different from cancer that starts in the bones (primary bone cancer). Bone metastasis is more common than primary bone cancer. The spine is the most common area for bone metastasis to occur. Other common areas include:  Hip bone (pelvis).  Ribs.  Skull.  Long bones of the arm or leg. Bone metastasis is painful. It also damages and weakens bones so that they may break easier, even from a minor injury. What are the causes? This condition is caused by cancer cells that spread to the bone. Cancer cells can spread to the rest of the body in two ways:  Through the bloodstream.  Through the vessels that carry white blood cells in the body (lymphatic system). What increases the risk? This condition is more likely to develop in people who have an advanced type of cancer that is known to spread to bone. Cancers that often spread to bone include:  Breast cancer.  Prostate cancer.  Lung cancer.  Thyroid cancer.  Kidney cancer.  Multiple myeloma.  Lymphoma. What are the signs or symptoms? The most common symptom of this condition is bone pain, especially while you are resting. Other symptoms include:  A broken bone (fracture) that happens with little or no trauma.  Low number of red blood cells (anemia). Bone destruction may damage the spongy tissue (bone marrow) in the center of bones where red blood cells are produced. Anemia can cause: ? Weakness. ? Shortness of breath. ? Headache. ? Dizziness.  Back or neck pain with numbness or weakness.  High levels of calcium in your blood (hypercalcemia). When bone is destroyed, calcium is released into your blood. Symptoms of hypercalcemia include: ? Constipation. ? Thirst. ? Nausea. ? Sleepiness. How is this  diagnosed? This condition may be diagnosed based on:  Your symptoms and medical history. Your health care provider may suspect this condition if you are being treated for cancer or have had cancer treatment in the past.  A physical exam.  Imaging studies, such as: ? Bone X-rays, especially in the area where you have pain. ? CT scan. ? Bone scan. ? MRI. ? PET scan.  Blood tests.  Urine tests.  A procedure to remove a piece of bone so it can be examined under a microscope (biopsy). How is this treated? Treatment for this condition depends on your overall health, the type of cancer you have, and how much the cancer has spread. You will work with a team of health care providers to determine which treatment is best for you. Treatment will focus on managing pain, preventing bone weakness, and slowing the spread of the cancer. Treatment may include:  Radiation therapy. This treatment uses X-rays to kill cancer cells. It is most effective for reducing pain, stopping tumor growth, and lowering the risk of fractures.  Radioisotope therapy. This treatment uses a radioactive medicine that is injected into your blood. The medicine travels to areas where cancer cells are active and kills them.  Chemotherapy. For this treatment, you are given cancer-killing medicines. You may have chemotherapy in cycles, with rest periods in between.  Medicines that: ? Help build bone (bisphosphonates and denosumab). These medicines are used to make bones stronger and control bone pain. They may also help to reduce hypercalcemia. ?  Reduce pain (opiates).  Endocrine therapies. These therapies slow cancer growth by blocking specific chemical messengers (hormones). Some types of cancer, including breast and prostate cancers, may grow because of hormones in the body.  Targeted therapy. These drugs are used to block the growth and spread of cancer cells. These drugs target a specific part of the cancer cell and usually  cause fewer side effects than chemotherapy.  Immunotherapies. These therapies use the body's defense system (immune system) to fight cancer cells.  Surgery. You may have surgery to remove bone cancer or to prevent or repair a fracture. Follow these instructions at home:  Take medicines only as directed by your health care provider.  If you are taking prescription pain medicine, take actions to prevent or treat constipation. Your health care provider may recommend that you: ? Drink enough fluid to keep your urine pale yellow. ? Eat foods that are high in fiber, such as fresh fruits and vegetables, whole grains, and beans. ? Limit foods that are high in fat and processed sugars, such as fried and sweet foods. ? Take an over-the-counter or prescription medicine for constipation.  Use devices to help you move around (mobility aids) as needed, such as canes, walkers, or scooters.  Do not drive or use heavy machinery while taking pain medicine.  Do not drink alcohol.  Do not use any products that contain nicotine or tobacco, such as cigarettes and e-cigarettes. If you need help quitting, ask your health care provider.  Keep all follow-up visits as told by your health care provider. This is important.   Contact a health care provider if:  Your pain medicine is not helping.  You are not able to care for yourself at home. Get help right away if:  You fall or have an injury.  Your pain suddenly gets worse.  You have trouble walking.  You have numbness or tingling in your legs.  You lose control of your bowels or your bladder.  You are very sleepy or confused. Summary  Bone metastasis is cancer that has spread from the part of the body where it started to the bones. This condition is more common than cancer that starts in the bones (primary bone cancer).  Bone metastasis is painful and damages and weakens the bones. It can also cause anemia and hypercalcemia.  Treatment for this  condition depends on your overall health, the type of cancer you have, and how much the cancer has spread.  Work with your health care team to determine which treatment is best for you. Take all medicines only as told by your health care provider. This information is not intended to replace advice given to you by your health care provider. Make sure you discuss any questions you have with your health care provider. Document Revised: 11/17/2019 Document Reviewed: 10/26/2017 Elsevier Patient Education  2021 [place].   Bone Metastasis  Bone metastasis is cancer that has spread from the part of the body where it started to the bones. A person may have bone metastasis in one bone or in more than one bone. Cancer that spreads to the bones is different from cancer that starts in the bones (primary bone cancer). Bone metastasis is more common than primary bone cancer. The spine is the most common area for bone metastasis to occur. Other common areas include:  Hip bone (pelvis).  Ribs.  Skull.  Long bones of the arm or leg. Bone metastasis is painful. It also damages and weakens bones so  that they may break easier, even from a minor injury. What are the causes? This condition is caused by cancer cells that spread to the bone. Cancer cells can spread to the rest of the body in two ways:  Through the bloodstream.  Through the vessels that carry white blood cells in the body (lymphatic system). What increases the risk? This condition is more likely to develop in people who have an advanced type of cancer that is known to spread to bone. Cancers that often spread to bone include:  Breast cancer.  Prostate cancer.  Lung cancer.  Thyroid cancer.  Kidney cancer.  Multiple myeloma.  Lymphoma. What are the signs or symptoms? The most common symptom of this condition is bone pain, especially while you are resting. Other symptoms include:  A broken bone (fracture) that happens with  little or no trauma.  Low number of red blood cells (anemia). Bone destruction may damage the spongy tissue (bone marrow) in the center of bones where red blood cells are produced. Anemia can cause: ? Weakness. ? Shortness of breath. ? Headache. ? Dizziness.  Back or neck pain with numbness or weakness.  High levels of calcium in your blood (hypercalcemia). When bone is destroyed, calcium is released into your blood. Symptoms of hypercalcemia include: ? Constipation. ? Thirst. ? Nausea. ? Sleepiness. How is this diagnosed? This condition may be diagnosed based on:  Your symptoms and medical history. Your health care provider may suspect this condition if you are being treated for cancer or have had cancer treatment in the past.  A physical exam.  Imaging studies, such as: ? Bone X-rays, especially in the area where you have pain. ? CT scan. ? Bone scan. ? MRI. ? PET scan.  Blood tests.  Urine tests.  A procedure to remove a piece of bone so it can be examined under a microscope (biopsy). How is this treated? Treatment for this condition depends on your overall health, the type of cancer you have, and how much the cancer has spread. You will work with a team of health care providers to determine which treatment is best for you. Treatment will focus on managing pain, preventing bone weakness, and slowing the spread of the cancer. Treatment may include:  Radiation therapy. This treatment uses X-rays to kill cancer cells. It is most effective for reducing pain, stopping tumor growth, and lowering the risk of fractures.  Radioisotope therapy. This treatment uses a radioactive medicine that is injected into your blood. The medicine travels to areas where cancer cells are active and kills them.  Chemotherapy. For this treatment, you are given cancer-killing medicines. You may have chemotherapy in cycles, with rest periods in between.  Medicines that: ? Help build bone  (bisphosphonates and denosumab). These medicines are used to make bones stronger and control bone pain. They may also help to reduce hypercalcemia. ? Reduce pain (opiates).  Endocrine therapies. These therapies slow cancer growth by blocking specific chemical messengers (hormones). Some types of cancer, including breast and prostate cancers, may grow because of hormones in the body.  Targeted therapy. These drugs are used to block the growth and spread of cancer cells. These drugs target a specific part of the cancer cell and usually cause fewer side effects than chemotherapy.  Immunotherapies. These therapies use the body's defense system (immune system) to fight cancer cells.  Surgery. You may have surgery to remove bone cancer or to prevent or repair a fracture. Follow these instructions at home:  Take medicines only as directed by your health care provider.  If you are taking prescription pain medicine, take actions to prevent or treat constipation. Your health care provider may recommend that you: ? Drink enough fluid to keep your urine pale yellow. ? Eat foods that are high in fiber, such as fresh fruits and vegetables, whole grains, and beans. ? Limit foods that are high in fat and processed sugars, such as fried and sweet foods. ? Take an over-the-counter or prescription medicine for constipation.  Use devices to help you move around (mobility aids) as needed, such as canes, walkers, or scooters.  Do not drive or use heavy machinery while taking pain medicine.  Do not drink alcohol.  Do not use any products that contain nicotine or tobacco, such as cigarettes and e-cigarettes. If you need help quitting, ask your health care provider.  Keep all follow-up visits as told by your health care provider. This is important.   Contact a health care provider if:  Your pain medicine is not helping.  You are not able to care for yourself at home. Get help right away if:  You fall or  have an injury.  Your pain suddenly gets worse.  You have trouble walking.  You have numbness or tingling in your legs.  You lose control of your bowels or your bladder.  You are very sleepy or confused. Summary  Bone metastasis is cancer that has spread from the part of the body where it started to the bones. This condition is more common than cancer that starts in the bones (primary bone cancer).  Bone metastasis is painful and damages and weakens the bones. It can also cause anemia and hypercalcemia.  Treatment for this condition depends on your overall health, the type of cancer you have, and how much the cancer has spread.  Work with your health care team to determine which treatment is best for you. Take all medicines only as told by your health care provider. This information is not intended to replace advice given to you by your health care provider. Make sure you discuss any questions you have with your health care provider. Document Revised: 11/17/2019 Document Reviewed: 10/26/2017 Elsevier Patient Education  2021 [place] With Less [employer] with less salt is one way to reduce the amount of sodium you get from food. Sodium is one of the elements that make up salt. It is found naturally in foods and is also added to certain foods. Depending on your condition and overall health, your health care provider or dietitian may recommend that you reduce your sodium intake. Most people should have less than 2,300 milligrams (mg) of sodium each day. If you have high blood pressure (hypertension), you may need to limit your sodium to 1,500 mg each day. Follow the tips below to help reduce your sodium intake. What are tips for eating less sodium? Reading food labels  Check the food label before buying or using packaged ingredients. Always check the label for the serving size and sodium content.  Look for products with no more than 140 mg of sodium in one  serving.  Check the % Daily Value column to see what percent of the daily recommended amount of sodium is provided in one serving of the product. Foods with 5% or less in this column are considered low in sodium. Foods with 20% or higher are considered high in sodium.  Do not choose foods with salt as one of the  first three ingredients on the ingredients list. If salt is one of the first three ingredients, it usually means the item is high in sodium.   Shopping  Buy sodium-free or low-sodium products. Look for the following words on food labels: ? Low-sodium. ? Sodium-free. ? Reduced-sodium. ? No salt added. ? Unsalted.  Always check the sodium content even if foods are labeled as low-sodium or no salt added.  Buy fresh foods. Cooking  Use herbs, seasonings without salt, and spices as substitutes for salt.  Use sodium-free baking soda when baking.  Grill, braise, or roast foods to add flavor with less salt.  Avoid adding salt to pasta, rice, or hot cereals.  Drain and rinse canned vegetables, beans, and meat before use.  Avoid adding salt when cooking sweets and desserts.  Cook with low-sodium ingredients. What foods are high in sodium? Vegetables Regular canned vegetables (not low-sodium or reduced-sodium). Sauerkraut, pickled vegetables, and relishes. Olives. [place] fries. Onion rings. Regular canned tomato sauce and paste. Regular tomato and vegetable juice. Frozen vegetables in sauces. Grains Instant hot cereals. Bread stuffing, pancake, and biscuit mixes. Croutons. Seasoned rice or pasta mixes. Noodle soup cups. Boxed or frozen macaroni and cheese. Regular salted crackers. Self-rising flour. Rolls. Bagels. Flour tortillas and wraps. Meats and other proteins Meat or fish that is salted, canned, smoked, cured, spiced, or pickled. This includes bacon, ham, sausages, hot dogs, corned beef, chipped beef, meat loaves, salt pork, jerky, pickled herring, anchovies, regular canned  tuna, and sardines. Salted nuts. Dairy Processed cheese and cheese spreads. Cheese curds. Blue cheese. Feta cheese. String cheese. Regular cottage cheese. Buttermilk. Canned milk. The items listed above may not be a complete list of foods high in sodium. Actual amounts of sodium may be different depending on processing. Contact a dietitian for more information. What foods are low in sodium? Fruits Fresh, frozen, or canned fruit with no sauce added. Fruit juice. Vegetables Fresh or frozen vegetables with no sauce added. "No salt added" canned vegetables. "No salt added" tomato sauce and paste. Low-sodium or reduced-sodium tomato and vegetable juice. Grains Noodles, pasta, quinoa, rice. Shredded or puffed wheat or puffed rice. Regular or quick oats (not instant). Low-sodium crackers. Low-sodium bread. Whole-grain bread and whole-grain pasta. Unsalted popcorn. Meats and other proteins Fresh or frozen whole meats, poultry (not injected with sodium), and fish with no sauce added. Unsalted nuts. Dried peas, beans, and lentils without added salt. Unsalted canned beans. Eggs. Unsalted nut butters. Low-sodium canned tuna or chicken. Dairy Milk. Soy milk. Yogurt. Low-sodium cheeses, such as Swiss, Monterey Jack, [place], and [employer]. Sherbet or ice cream (keep to  cup per serving). Cream cheese. Fats and oils Unsalted butter or margarine. Other foods Homemade pudding. Sodium-free baking soda and baking powder. Herbs and spices. Low-sodium seasoning mixes. Beverages Coffee and tea. Carbonated beverages. The items listed above may not be a complete list of foods low in sodium. Actual amounts of sodium may be different depending on processing. Contact a dietitian for more information. What are some salt alternatives when cooking? The following are herbs, seasonings, and spices that can be used instead of salt to flavor your food. Herbs should be fresh or dried. Do not choose packaged mixes. Next to the  name of the herb, spice, or seasoning are some examples of foods you can pair it with. Herbs  Bay leaves - Soups, meat and vegetable dishes, and spaghetti sauce.  Basil - [employer], soups, pasta, and fish dishes.  Cilantro - Meat, poultry,  and vegetable dishes.  Chili powder - Marinades and Mexican dishes.  Chives - Salad dressings and potato dishes.  Cumin - Mexican dishes, couscous, and meat dishes.  Dill - Fish dishes, sauces, and salads.  Fennel - Meat and vegetable dishes, breads, and cookies.  Garlic (do not use garlic salt) - [place] dishes, meat dishes, salad dressings, and sauces.  Marjoram - Soups, potato dishes, and meat dishes.  Oregano - Pizza and spaghetti sauce.  Parsley - Salads, soups, pasta, and meat dishes.  Rosemary - [place] dishes, salad dressings, soups, and red meats.  Saffron - Fish dishes, pasta, and some poultry dishes.  Sage - Stuffings and sauces.  Tarragon - Fish and [employer].  Thyme - Stuffing, meat, and fish dishes. Seasonings  Lemon juice - Fish dishes, poultry dishes, vegetables, and salads.  Vinegar - Salad dressings, vegetables, and fish dishes. Spices  Cinnamon - Sweet dishes, such as cakes, cookies, and puddings.  Cloves - Gingerbread, puddings, and marinades for meats.  Curry - Vegetable dishes, fish and poultry dishes, and stir-fry dishes.  Ginger - Vegetable dishes, fish dishes, and stir-fry dishes.  Nutmeg - Pasta, vegetables, poultry, fish dishes, and custard. Summary  Cooking with less salt is one way to reduce the amount of sodium that you get from food.  Buy sodium-free or low-sodium products.  Check the food label before using or buying packaged ingredients.  Use herbs, seasonings without salt, and spices as substitutes for salt in foods. This information is not intended to replace advice given to you by your health care provider. Make sure you discuss any questions you have with your health care  provider. Document Revised: 09/20/2019 Document Reviewed: 09/20/2019 Elsevier Patient Education  [place].

## 2020-10-22 ENCOUNTER — Other Ambulatory Visit: Payer: Self-pay | Admitting: Hematology & Oncology

## 2020-10-22 DIAGNOSIS — N6310 Unspecified lump in the right breast, unspecified quadrant: Secondary | ICD-10-CM

## 2020-10-22 LAB — MULTIPLE MYELOMA PANEL, SERUM
Albumin SerPl Elph-Mcnc: 2.4 g/dL — ABNORMAL LOW (ref 2.9–4.4)
Albumin/Glob SerPl: 0.7 (ref 0.7–1.7)
Alpha 1: 0.7 g/dL — ABNORMAL HIGH (ref 0.0–0.4)
Alpha2 Glob SerPl Elph-Mcnc: 1.4 g/dL — ABNORMAL HIGH (ref 0.4–1.0)
B-Globulin SerPl Elph-Mcnc: 1 g/dL (ref 0.7–1.3)
Gamma Glob SerPl Elph-Mcnc: 0.7 g/dL (ref 0.4–1.8)
Globulin, Total: 3.7 g/dL (ref 2.2–3.9)
IgA: 153 mg/dL (ref 87–352)
IgG (Immunoglobin G), Serum: 714 mg/dL (ref 586–1602)
IgM (Immunoglobulin M), Srm: 76 mg/dL (ref 26–217)
Total Protein ELP: 6.1 g/dL (ref 6.0–8.5)

## 2020-10-22 LAB — UPEP/UIFE/LIGHT CHAINS/TP, 24-HR UR
% BETA, Urine: 32.9 %
ALPHA 1 URINE: 5.2 %
Albumin, U: 18.8 %
Alpha 2, Urine: 25.7 %
Free Kappa Lt Chains,Ur: 142.39 mg/L — ABNORMAL HIGH (ref 0.63–113.79)
Free Kappa/Lambda Ratio: 4.99 (ref 1.03–31.76)
Free Lambda Lt Chains,Ur: 28.55 mg/L — ABNORMAL HIGH (ref 0.47–11.77)
GAMMA GLOBULIN URINE: 17.4 %
Total Protein, Urine-Ur/day: 509 mg/24 hr — ABNORMAL HIGH (ref 30–150)
Total Protein, Urine: 33.9 mg/dL
Total Volume: 1500

## 2020-10-22 LAB — CANCER ANTIGEN 27.29: CA 27.29: 128.4 U/mL — ABNORMAL HIGH (ref 0.0–38.6)

## 2020-10-23 ENCOUNTER — Other Ambulatory Visit: Payer: Self-pay | Admitting: Hematology & Oncology

## 2020-10-23 ENCOUNTER — Encounter: Payer: Self-pay | Admitting: *Deleted

## 2020-10-23 ENCOUNTER — Other Ambulatory Visit: Payer: Self-pay | Admitting: *Deleted

## 2020-10-23 DIAGNOSIS — N6310 Unspecified lump in the right breast, unspecified quadrant: Secondary | ICD-10-CM

## 2020-10-23 MED ORDER — LORAZEPAM 0.5 MG PO TABS: ORAL_TABLET | ORAL | 0 refills | Status: DC

## 2020-10-23 NOTE — Progress Notes (Signed)
Dr Marin Olp saw this patient in the hospital. Pt needs to be scheduled for diagnostic mammogram and biopsy, and PET. Per Dr Marin Olp he would like to see her in the office after her biopsy results.   Called and spoke to [facility] and was able to get patient scheduled for scans tomorrow. They will determine biopsy at that appointment.   Reached out to Levell July to introduce myself as the office RN Navigator and explain our new patient process. Spoke to patient's daughter, Raquel Sarna, as patient states she is still too weak. Reviewed appointments made for patient and explained that we would make office appointment once we knew when biopsy would be completed.   Reviewed Breast center policies, and preparation for PET scan with [facility]. She requests that patient have a prescription for an antianxiety medication. In the hospital, she was prescribed Ativan.   Also reviewed the following with the patient's daughter per the financial counselor "let her know to call her plan and make sure Dr Johnette Abraham and [facility] services will be covered w [place]. I was on hold for 45 mins in the pa dept because they could not recognize our npi and tax id numbers. The rep said there is no pa required but not a guarantee of pymt. She will need to call her plan and see. I would hate for her to get a huge bill and expect for me to fix it. I did reach out to [facility] about and no one for certain about the in network and advised for pt to call them"  Informed patient about my role as a navigator and that I will meet with them prior to their New Patient appointment and more fully discuss what services I can provide. At this time patient has no further questions or needs.   Oncology Nurse Navigator Documentation  Oncology Nurse Navigator Flowsheets 10/23/2020  Abnormal Finding Date 10/18/2020  Diagnosis Status Additional Work Up  Navigator Follow Up Date: 10/24/2020  Navigator Follow Up Reason: Appointment Review  Navigator Location [facility]   Navigator Encounter Type Introductory Phone Call;Appt/Treatment Plan Review  Patient Visit Type MedOnc  Treatment Phase Abnormal Scans  Barriers/Navigation Needs Coordination of Care;Education;Anxiety  Education Preparing for Upcoming Surgery/ Treatment;Other  Interventions Coordination of Care;Education;Psycho-Social Support;Referrals  Acuity Level 2-Minimal Needs (1-2 Barriers Identified)  Referrals Radiation  Coordination of Care Appts;Radiology  Education Method Verbal  Support Groups/Services Friends and Family  Time Spent with Patient 62

## 2020-10-24 ENCOUNTER — Ambulatory Visit: Payer: BC Managed Care – PPO

## 2020-10-24 ENCOUNTER — Ambulatory Visit
Admission: RE | Admit: 2020-10-24 | Discharge: 2020-10-24 | Disposition: A | Discharge status: Home / Self Care | Payer: BC Managed Care – PPO | Source: Ambulatory Visit | Attending: Hematology & Oncology | Admitting: Hematology & Oncology

## 2020-10-24 ENCOUNTER — Encounter: Payer: Self-pay | Admitting: *Deleted

## 2020-10-24 ENCOUNTER — Other Ambulatory Visit: Payer: Self-pay

## 2020-10-24 ENCOUNTER — Other Ambulatory Visit: Payer: Self-pay | Admitting: Hematology & Oncology

## 2020-10-24 DIAGNOSIS — R922 Inconclusive mammogram: Secondary | ICD-10-CM | POA: Diagnosis not present

## 2020-10-24 DIAGNOSIS — N6310 Unspecified lump in the right breast, unspecified quadrant: Secondary | ICD-10-CM

## 2020-10-24 DIAGNOSIS — N6311 Unspecified lump in the right breast, upper outer quadrant: Secondary | ICD-10-CM | POA: Diagnosis not present

## 2020-10-24 DIAGNOSIS — N6312 Unspecified lump in the right breast, upper inner quadrant: Secondary | ICD-10-CM | POA: Diagnosis not present

## 2020-10-24 DIAGNOSIS — R921 Mammographic calcification found on diagnostic imaging of breast: Secondary | ICD-10-CM | POA: Diagnosis not present

## 2020-10-24 NOTE — Progress Notes (Signed)
Patient scheduled for biopsy. Should have complete results by mid next week. New Patient appointment scheduled for Friday. Called and spoke to daughter, Raquel Sarna. Appointment reviewed, with address and directions.   Oncology Nurse Navigator Documentation  Oncology Nurse Navigator Flowsheets 10/24/2020  Abnormal Finding Date -  Diagnosis Status -  Navigator Follow Up Date: 11/01/2020  Navigator Follow Up Reason: New Patient Appointment  Navigator Location [facility]  Navigator Encounter Type Telephone  Telephone Outgoing Call;Appt Confirmation/Clarification  Patient Visit Type MedOnc  Treatment Phase Abnormal Scans  Barriers/Navigation Needs Coordination of Care;Education;Anxiety  Education Other  Interventions Coordination of Care;Psycho-Social Support  Acuity Level 2-Minimal Needs (1-2 Barriers Identified)  Referrals -  Coordination of Care Appts  Education Method Verbal  Support Groups/Services Friends and Family  Time Spent with Patient 4

## 2020-10-25 ENCOUNTER — Ambulatory Visit
Admission: RE | Admit: 2020-10-25 | Discharge: 2020-10-25 | Disposition: A | Discharge status: Home / Self Care | Payer: BC Managed Care – PPO | Source: Ambulatory Visit | Attending: Hematology & Oncology | Admitting: Hematology & Oncology

## 2020-10-25 DIAGNOSIS — R928 Other abnormal and inconclusive findings on diagnostic imaging of breast: Secondary | ICD-10-CM | POA: Diagnosis not present

## 2020-10-25 DIAGNOSIS — N6310 Unspecified lump in the right breast, unspecified quadrant: Secondary | ICD-10-CM

## 2020-10-25 DIAGNOSIS — N6311 Unspecified lump in the right breast, upper outer quadrant: Secondary | ICD-10-CM | POA: Diagnosis not present

## 2020-10-25 DIAGNOSIS — C50811 Malignant neoplasm of overlapping sites of right female breast: Secondary | ICD-10-CM | POA: Diagnosis not present

## 2020-10-30 ENCOUNTER — Other Ambulatory Visit: Payer: Self-pay

## 2020-10-30 ENCOUNTER — Encounter: Payer: Self-pay | Admitting: Internal Medicine

## 2020-10-30 ENCOUNTER — Ambulatory Visit: Payer: BC Managed Care – PPO | Admitting: Internal Medicine

## 2020-10-30 VITALS — BP 166/86 | HR 100 | Temp 98.4°F | Resp 16 | Ht 68.0 in

## 2020-10-30 DIAGNOSIS — D500 Iron deficiency anemia secondary to blood loss (chronic): Secondary | ICD-10-CM

## 2020-10-30 DIAGNOSIS — Z0000 Encounter for general adult medical examination without abnormal findings: Secondary | ICD-10-CM

## 2020-10-30 DIAGNOSIS — I10 Essential (primary) hypertension: Secondary | ICD-10-CM | POA: Diagnosis not present

## 2020-10-30 DIAGNOSIS — D539 Nutritional anemia, unspecified: Secondary | ICD-10-CM | POA: Diagnosis not present

## 2020-10-30 DIAGNOSIS — Z0001 Encounter for general adult medical examination with abnormal findings: Secondary | ICD-10-CM

## 2020-10-30 LAB — CBC WITH DIFFERENTIAL/PLATELET
Basophils Absolute: 0.1 10*3/uL (ref 0.0–0.1)
Basophils Relative: 1 % (ref 0.0–3.0)
Eosinophils Absolute: 0.2 10*3/uL (ref 0.0–0.7)
Eosinophils Relative: 2 % (ref 0.0–5.0)
HCT: 33 % — ABNORMAL LOW (ref 36.0–46.0)
Hemoglobin: 10.6 g/dL — ABNORMAL LOW (ref 12.0–15.0)
Lymphocytes Relative: 11.9 % — ABNORMAL LOW (ref 12.0–46.0)
Lymphs Abs: 1.3 10*3/uL (ref 0.7–4.0)
MCHC: 32.2 g/dL (ref 30.0–36.0)
MCV: 74 fl — ABNORMAL LOW (ref 78.0–100.0)
Monocytes Absolute: 0.7 10*3/uL (ref 0.1–1.0)
Monocytes Relative: 6 % (ref 3.0–12.0)
Neutro Abs: 8.6 10*3/uL — ABNORMAL HIGH (ref 1.4–7.7)
Neutrophils Relative %: 79.1 % — ABNORMAL HIGH (ref 43.0–77.0)
Platelets: 552 10*3/uL — ABNORMAL HIGH (ref 150.0–400.0)
RBC: 4.47 Mil/uL (ref 3.87–5.11)
RDW: 19.3 % — ABNORMAL HIGH (ref 11.5–15.5)
WBC: 10.9 10*3/uL — ABNORMAL HIGH (ref 4.0–10.5)

## 2020-10-30 LAB — IRON: Iron: 61 ug/dL (ref 42–145)

## 2020-10-30 LAB — LIPID PANEL
Cholesterol: 165 mg/dL (ref 0–200)
HDL: 38.9 mg/dL — ABNORMAL LOW (ref >39.00)
LDL Cholesterol: 106 mg/dL — ABNORMAL HIGH (ref 0–99)
NonHDL: 126.49
Total CHOL/HDL Ratio: 4
Triglycerides: 100 mg/dL (ref 0.0–149.0)
VLDL: 20 mg/dL (ref 0.0–40.0)

## 2020-10-30 LAB — VITAMIN B12: Vitamin B-12: >1526 pg/mL — ABNORMAL HIGH (ref 211–911)

## 2020-10-30 LAB — FERRITIN: Ferritin: 1095 ng/mL — ABNORMAL HIGH (ref 10.0–291.0)

## 2020-10-30 LAB — TSH: TSH: 2.52 u[IU]/mL (ref 0.35–4.50)

## 2020-10-30 LAB — VITAMIN D 25 HYDROXY (VIT D DEFICIENCY, FRACTURES): VITD: 50.73 ng/mL (ref 30.00–100.00)

## 2020-10-30 LAB — FOLATE: Folate: >23.6 ng/mL (ref >5.9)

## 2020-10-30 MED ORDER — EDARBYCLOR 40-12.5 MG PO TABS: 1.0000 | ORAL_TABLET | Freq: Every day | ORAL | 0 refills | Status: DC

## 2020-10-30 NOTE — Patient Instructions (Signed)

## 2020-10-30 NOTE — Progress Notes (Signed)
Subjective:  Patient ID: Annette James, female    DOB: 05-Nov-1960  Age: 60 y.o. MRN: [id number]  CC: Annual Exam, Hypertension, and Anemia  This visit occurred during the SARS-CoV-2 public health emergency.  Safety protocols were in place, including screening questions prior to the visit, additional usage of staff PPE, and extensive cleaning of exam room while observing appropriate contact time as indicated for disinfecting solutions.    HPI Annette James presents for a CPX.  She recently moved from [place] to [place].  She recently sustained a fall and was seen in the ED.  She was found to have multiple pathological fractures as result of metastatic breast cancer.  She was also diagnosed with poorly controlled hypertension.  She is taking amlodipine but tells me her blood pressure has not been adequately well controlled.  She has had some dyspnea on exertion but this was felt to be related to iron deficiency anemia that required an iron infusion.  She denies chest pain, diaphoresis, dizziness, or lightheadedness.  History Annette James has a past medical history of Breast cancer metastasized to bone, unspecified laterality ([place]) (11/01/2020), Chicken pox, Goals of care, counseling/discussion (10/19/2020), and Metastatic cancer to spine ([place]) (10/19/2020).   She has a past surgical history that includes Cholecystectomy; Appendectomy; and Cesarean section.   Her family history includes Breast cancer in her mother; Colon cancer in her father; Hypertension in her father and mother.She reports that she has never smoked. She has never used smokeless tobacco. No history on file for alcohol use and drug use.  Outpatient Medications Prior to Visit  Medication Sig Dispense Refill  . amLODipine (NORVASC) 5 MG tablet Take 1 tablet (5 mg total) by mouth daily. 30 tablet 1  . ascorbic acid (VITAMIN C) 500 MG tablet Take 500 mg by mouth daily.    Marland Kitchen LORazepam (ATIVAN) 0.5 MG tablet Take 30 minutes before scans  or procedures as needed for anxiety. 15 tablet 0  . metaxalone (SKELAXIN) 400 MG tablet Take 1 tablet (400 mg total) by mouth 3 (three) times daily. 60 tablet 0  . Multiple Vitamins-Minerals (ONE-A-DAY WOMENS 50 PLUS) TABS Take 1 tablet by mouth daily.    Marland Kitchen oxyCODONE 10 MG TABS Take 1 tablet (10 mg total) by mouth every 6 (six) hours as needed for moderate pain. 30 tablet 0  . fentaNYL (DURAGESIC) 25 MCG/HR Place 1 patch onto the skin every 3 (three) days. 5 patch 0   No facility-administered medications prior to visit.    ROS Review of Systems  Constitutional: Positive for fatigue. Negative for appetite change, chills, diaphoresis and unexpected weight change.  HENT: Negative.  Negative for sore throat.   Eyes: Negative for visual disturbance.  Respiratory: Positive for shortness of breath (DOE). Negative for chest tightness and wheezing.   Cardiovascular: Negative for chest pain, palpitations and leg swelling.  Gastrointestinal: Negative for abdominal pain, blood in stool, constipation, diarrhea, nausea and vomiting.  Genitourinary: Negative.  Negative for difficulty urinating.  Musculoskeletal: Positive for back pain. Negative for myalgias.  Skin: Negative.  Negative for color change and pallor.  Neurological: Negative.  Negative for dizziness, weakness, light-headedness, numbness and headaches.  Hematological: Negative for adenopathy. Does not bruise/bleed easily.  Psychiatric/Behavioral: The patient is nervous/anxious.     Objective:  BP (!) 166/86   Pulse 100   Temp 98.4 F (36.9 C) (Oral)   Resp 16   Ht 5\' 8"  (1.727 m)   SpO2 98%   BMI 36.49 kg/m  Physical Exam Constitutional:      Appearance: She is ill-appearing (in a wheelchair).  HENT:     Nose: Nose normal.     Mouth/Throat:     Mouth: Mucous membranes are moist.  Eyes:     General: No scleral icterus.    Conjunctiva/sclera: Conjunctivae normal.  Cardiovascular:     Rate and Rhythm: Normal rate and regular  rhythm.     Heart sounds: No murmur heard.   Pulmonary:     Effort: Pulmonary effort is normal.     Breath sounds: No stridor. No wheezing, rhonchi or rales.  Abdominal:     General: Abdomen is protuberant. Bowel sounds are normal. There is no distension.     Palpations: Abdomen is soft. There is no hepatomegaly, splenomegaly or mass.     Tenderness: There is no abdominal tenderness.  Musculoskeletal:        General: Normal range of motion.     Cervical back: Neck supple.     Right lower leg: No edema.     Left lower leg: No edema.  Lymphadenopathy:     Cervical: No cervical adenopathy.  Skin:    General: Skin is warm and dry.     Coloration: Skin is pale.  Neurological:     General: No focal deficit present.     Mental Status: She is alert and oriented to person, place, and time. Mental status is at baseline.  Psychiatric:        Mood and Affect: Mood normal.        Behavior: Behavior normal.     Lab Results  Component Value Date   WBC 10.2 11/01/2020   HGB 11.1 (L) 11/01/2020   HCT 36.0 11/01/2020   PLT 450 (H) 11/01/2020   GLUCOSE 118 (H) 11/01/2020   CHOL 165 10/30/2020   TRIG 100.0 10/30/2020   HDL 38.90 (L) 10/30/2020   LDLCALC 106 (H) 10/30/2020   ALT 23 11/01/2020   AST 35 11/01/2020   NA 139 11/01/2020   K 4.2 11/01/2020   CL 102 11/01/2020   CREATININE 0.69 11/01/2020   BUN 16 11/01/2020   CO2 27 11/01/2020   TSH 2.52 10/30/2020    Assessment & Plan:   Annette James was seen today for annual exam, hypertension and anemia.  Diagnoses and all orders for this visit:  Deficiency anemia- Her H&H have improved.  I will screen her for other vitamin deficiencies. -     Vitamin B12; Future -     CBC with Differential/Platelet; Future -     Folate; Future -     Vitamin B1; Future -     Vitamin B1 -     Folate -     Vitamin B12 -     CBC with Differential/Platelet  Primary hypertension- Her blood pressure is not adequately well controlled.  Her labs are  negative for secondary causes or endorgan damage.  I recommended that she add an ARB and thiazide diuretic to the CCB. -     TSH; Future -     VITAMIN D 25 Hydroxy (Vit-D Deficiency, Fractures); Future -     Azilsartan-Chlorthalidone (EDARBYCLOR) 40-12.5 MG TABS; Take 1 tablet by mouth daily. -     VITAMIN D 25 Hydroxy (Vit-D Deficiency, Fractures) -     TSH  Encounter for general adult medical examination with abnormal findings- Exam completed, labs reviewed - Statin therapy is not indicated, vaccines reviewed - She deferred a flu vaccine, cancer screenings addressed,  patient education material was given. -     Lipid panel; Future -     Lipid panel  Iron deficiency anemia due to chronic blood loss- Her H&H have improved and her iron level is normal now. -     CBC with Differential/Platelet; Future -     Iron; Future -     Ferritin; Future -     Ferritin -     CBC with Differential/Platelet -     Iron   I am having Levell July start on [place]. I am also having her maintain her One-A-Day Womens 50 Plus, ascorbic acid, metaxalone, Oxycodone HCl, amLODipine, and LORazepam.  Meds ordered this encounter  Medications  . Azilsartan-Chlorthalidone (EDARBYCLOR) 40-12.5 MG TABS    Sig: Take 1 tablet by mouth daily.    Dispense:  90 tablet    Refill:  0     Follow-up: Return in about 3 months (around 01/28/2021).  Scarlette Calico, MD

## 2020-10-31 ENCOUNTER — Telehealth: Payer: Self-pay

## 2020-10-31 NOTE — Progress Notes (Signed)
Histology and Location of Primary Cancer:Breast Cancer  Sites of Visceral and Bony Metastatic Disease:   Location(s) of Symptomatic Metastases:   SpinePast/Anticipated chemotherapy by medical oncology, if any:   Biopsy : 10/22/20   MMUNOHISTOCHEMICAL AND MORPHOMETRIC ANALYSIS PERFORMED MANUALLY The tumor cells are NEGATIVE for Her2 (1+). Estrogen Receptor: >95%, POSITIVE, STRONG STAINING INTENSITY Progesterone Receptor: >95%, POSITIVE, STRONG STAINING INTENSITY Proliferation Marker Ki67: 15% REFERENCE RANGE ESTROGEN RECEPTOR NEGATIVE 0% POSITIVE =>1% REFERENCE RANGE PROGESTERONE RECEPTOR NEGATIVE 0% POSITIVE =>1%   Pain on a scale of 0-10 is:7 Back and Hip  If Spine Met(s), symptoms, if any, include:  Bowel/Bladder retention or incontinence None  Numbness or weakness in extremities None  Current Decadron regimen, if applicable:None  Ambulatory status? Walker? Wheelchair?: Patient can ambulate   SAFETY ISSUES:  Prior radiation? None  Pacemaker/ICD? None  Possible current pregnancy? None  Is the patient on methotrexate? None  Current Complaints / other details:   Vitals:   11/06/20 1030  BP: (!) 143/92  Pulse: (!) 102  Resp: 20  Temp: 98 F (36.7 C)  TempSrc: Temporal  SpO2: 98%  Weight: 103.1 kg  Height: _0  (1.727 m)

## 2020-10-31 NOTE — Telephone Encounter (Signed)
Key: [id number]

## 2020-11-01 ENCOUNTER — Other Ambulatory Visit: Payer: Self-pay

## 2020-11-01 ENCOUNTER — Encounter: Payer: Self-pay | Admitting: Internal Medicine

## 2020-11-01 ENCOUNTER — Inpatient Hospital Stay: Payer: BC Managed Care – PPO | Attending: Hematology & Oncology | Admitting: Hematology & Oncology

## 2020-11-01 ENCOUNTER — Telehealth: Payer: Self-pay | Admitting: Pharmacist

## 2020-11-01 ENCOUNTER — Telehealth: Payer: Self-pay | Admitting: Pharmacy Technician

## 2020-11-01 ENCOUNTER — Inpatient Hospital Stay: Payer: BC Managed Care – PPO

## 2020-11-01 ENCOUNTER — Other Ambulatory Visit: Payer: Self-pay | Admitting: Family

## 2020-11-01 ENCOUNTER — Encounter: Payer: Self-pay | Admitting: *Deleted

## 2020-11-01 ENCOUNTER — Encounter: Payer: Self-pay | Admitting: Hematology & Oncology

## 2020-11-01 VITALS — BP 154/95 | HR 96 | Temp 98.4°F | Resp 16 | Wt 235.0 lb

## 2020-11-01 DIAGNOSIS — C50919 Malignant neoplasm of unspecified site of unspecified female breast: Secondary | ICD-10-CM | POA: Diagnosis not present

## 2020-11-01 DIAGNOSIS — C50911 Malignant neoplasm of unspecified site of right female breast: Secondary | ICD-10-CM | POA: Insufficient documentation

## 2020-11-01 DIAGNOSIS — C7951 Secondary malignant neoplasm of bone: Secondary | ICD-10-CM | POA: Insufficient documentation

## 2020-11-01 DIAGNOSIS — Z5111 Encounter for antineoplastic chemotherapy: Secondary | ICD-10-CM | POA: Insufficient documentation

## 2020-11-01 DIAGNOSIS — Z170 Estrogen receptor positive status [ER+]: Secondary | ICD-10-CM | POA: Insufficient documentation

## 2020-11-01 HISTORY — DX: Malignant neoplasm of unspecified site of unspecified female breast: C50.919

## 2020-11-01 LAB — CBC WITH DIFFERENTIAL (CANCER CENTER ONLY)
Abs Immature Granulocytes: 0.53 10*3/uL — ABNORMAL HIGH (ref 0.00–0.07)
Basophils Absolute: 0.1 10*3/uL (ref 0.0–0.1)
Basophils Relative: 1 %
Eosinophils Absolute: 0.3 10*3/uL (ref 0.0–0.5)
Eosinophils Relative: 3 %
HCT: 36 % (ref 36.0–46.0)
Hemoglobin: 11.1 g/dL — ABNORMAL LOW (ref 12.0–15.0)
Immature Granulocytes: 5 %
Lymphocytes Relative: 16 %
Lymphs Abs: 1.6 10*3/uL (ref 0.7–4.0)
MCH: 24.2 pg — ABNORMAL LOW (ref 26.0–34.0)
MCHC: 30.8 g/dL (ref 30.0–36.0)
MCV: 78.4 fL — ABNORMAL LOW (ref 80.0–100.0)
Monocytes Absolute: 0.5 10*3/uL (ref 0.1–1.0)
Monocytes Relative: 5 %
Neutro Abs: 7.1 10*3/uL (ref 1.7–7.7)
Neutrophils Relative %: 70 %
Platelet Count: 450 10*3/uL — ABNORMAL HIGH (ref 150–400)
RBC: 4.59 MIL/uL (ref 3.87–5.11)
RDW: 20.2 % — ABNORMAL HIGH (ref 11.5–15.5)
WBC Count: 10.2 10*3/uL (ref 4.0–10.5)
nRBC: 1 % — ABNORMAL HIGH (ref 0.0–0.2)

## 2020-11-01 LAB — CMP (CANCER CENTER ONLY)
ALT: 23 U/L (ref 0–44)
AST: 35 U/L (ref 15–41)
Albumin: 3.7 g/dL (ref 3.5–5.0)
Alkaline Phosphatase: 368 U/L — ABNORMAL HIGH (ref 38–126)
Anion gap: 10 (ref 5–15)
BUN: 16 mg/dL (ref 6–20)
CO2: 27 mmol/L (ref 22–32)
Calcium: 10.2 mg/dL (ref 8.9–10.3)
Chloride: 102 mmol/L (ref 98–111)
Creatinine: 0.69 mg/dL (ref 0.44–1.00)
GFR, Estimated: >60 mL/min (ref >60)
Glucose, Bld: 118 mg/dL — ABNORMAL HIGH (ref 70–99)
Potassium: 4.2 mmol/L (ref 3.5–5.1)
Sodium: 139 mmol/L (ref 135–145)
Total Bilirubin: 0.4 mg/dL (ref 0.3–1.2)
Total Protein: 7.6 g/dL (ref 6.5–8.1)

## 2020-11-01 LAB — LACTATE DEHYDROGENASE: LDH: 262 U/L — ABNORMAL HIGH (ref 98–192)

## 2020-11-01 MED ORDER — FENTANYL 25 MCG/HR TD PT72: 1.0000 | MEDICATED_PATCH | TRANSDERMAL | 0 refills | Status: DC

## 2020-11-01 MED ORDER — RIBOCICLIB SUCC (600 MG DOSE) 200 MG PO TBPK: 600.0000 mg | ORAL_TABLET | Freq: Every day | ORAL | 5 refills | Status: DC

## 2020-11-01 MED FILL — FENTANYL 25 MCG/HR PT72: 25 | 30 days supply | Qty: 10 | Fill #0

## 2020-11-01 NOTE — Telephone Encounter (Signed)
Reviewed PA request. PA was canceled.   New PA stared.   [employer] [employer])

## 2020-11-01 NOTE — Progress Notes (Signed)
Hematology and Oncology Follow Up Visit  Annette James [id number] [birth year] 60 y.o. 11/01/2020   Principle Diagnosis:   Metastatic breast cancer-bone only metastasis-ER positive/PR positive/HER2 negative  Current Therapy:    Faslodex 500 mg IM monthly-start on 11/06/2020  Ribociclib 600 mg p.o. daily (21 days on/7 days off)-start on 11/06/2020  Zometa 4 mg IV q. 3 months --start on 12/03/2020     Interim History:  Annette James is in for first office visit.  I saw her in consult Annette James at [facility] back on October 20, 2019.  At that time, she presented with a lot of pain.  She is originally from [place].  She has been down in [place] for about a year.  She works for a [employer].  She came in because of some imbalance and a fall.  She had scans done which showed lytic lesions.  Eventually, she was found to have a mass in the right breast.  She had scans and MRIs.  Again everything just showed that she had bony metastasis.  She had a CA 27.29 of 128.  She was discharged and then had outpatient mammogram and biopsies.  Mammogram show that there is a mass in the right breast.  This was ultimately biopsied.  The biopsy was done on 10/25/2020.  The pathology report ([id number]) showed an invasive ductal carcinoma.  The tumor was strongly estrogen positive and progesterone positive.  The HER2 was negative.  There was Ki67 of 15%.  She is on a fentanyl patch for pain.  She takes oxycodone for pain.  This seems to help a little bit..  She is due for a PET scan next week.  She sees Dr. Sondra Come of Radiation Oncology next week.  She comes in with one of her daughters.  Thankfully, we can just use antiestrogen therapy on her.  I think she would be a great candidate for Faslodex and ribociclib.  I forgot to mention that she did receive Zometa in the hospital to help with her bones.  She is eating okay.  There is no nausea or vomiting.  She is having no problems with bowels or  bladder.  Overall, I would say her performance status is ECOG 1.  Medications:  Current Outpatient Medications:  .  ribociclib succ (KISQALI 600MG DAILY DOSE) 200 MG Therapy Pack, Take 3 tablets (600 mg total) by mouth daily. Take for 21 days on, 7 days off, repeat every 28 days., Disp: 63 tablet, Rfl: 5 .  amLODipine (NORVASC) 5 MG tablet, Take 1 tablet (5 mg total) by mouth daily., Disp: 30 tablet, Rfl: 1 .  ascorbic acid (VITAMIN C) 500 MG tablet, Take 500 mg by mouth daily., Disp: , Rfl:  .  Azilsartan-Chlorthalidone (EDARBYCLOR) 40-12.5 MG TABS, Take 1 tablet by mouth daily., Disp: 90 tablet, Rfl: 0 .  fentaNYL (DURAGESIC) 25 MCG/HR, Place 1 patch onto the skin every 3 (three) days., Disp: 10 patch, Rfl: 0 .  LORazepam (ATIVAN) 0.5 MG tablet, Take 30 minutes before scans or procedures as needed for anxiety., Disp: 15 tablet, Rfl: 0 .  metaxalone (SKELAXIN) 400 MG tablet, Take 1 tablet (400 mg total) by mouth 3 (three) times daily., Disp: 60 tablet, Rfl: 0 .  Multiple Vitamins-Minerals (ONE-A-DAY WOMENS 50 PLUS) TABS, Take 1 tablet by mouth daily., Disp: , Rfl:  .  oxyCODONE 10 MG TABS, Take 1 tablet (10 mg total) by mouth every 6 (six) hours as needed for moderate pain., Disp: 30 tablet, Rfl: 0  Allergies: No Known Allergies  Past Medical History, Surgical history, Social history, and Family History were reviewed and updated.  Review of Systems: Review of Systems  Constitutional: Negative.   HENT:  Negative.   Eyes: Negative.   Respiratory: Negative.   Cardiovascular: Negative.   Gastrointestinal: Negative.   Endocrine: Negative.   Genitourinary: Negative.    Musculoskeletal: Positive for back pain, neck pain and neck stiffness.  Neurological: Negative.   Hematological: Negative.   Psychiatric/Behavioral: Negative.     Physical Exam:  weight is 235 lb (106.6 kg). Her oral temperature is 98.4 F (36.9 C). Her blood pressure is 154/95 (abnormal) and her pulse is 96. Her  respiration is 16 and oxygen saturation is 99%.   Wt Readings from Last 3 Encounters:  11/01/20 235 lb (106.6 kg)  10/18/20 240 lb (108.9 kg)    Physical Exam Vitals reviewed.  Constitutional:      Comments: Her breast exam shows in the right breast a mass at about the 12 o'clock position.  This is firm.  This measures about 3-4 cm.  It is not mobile.  I cannot palpate any right axillary adenopathy.  HENT:     Head: Normocephalic and atraumatic.     Mouth/Throat:     Mouth: Oropharynx is clear and moist.  Eyes:     Extraocular Movements: EOM normal.     Pupils: Pupils are equal, round, and reactive to light.  Cardiovascular:     Rate and Rhythm: Normal rate and regular rhythm.     Heart sounds: Normal heart sounds.  Pulmonary:     Effort: Pulmonary effort is normal.     Breath sounds: Normal breath sounds.  Abdominal:     General: Bowel sounds are normal.     Palpations: Abdomen is soft.  Musculoskeletal:        General: No tenderness, deformity or edema. Normal range of motion.     Cervical back: Normal range of motion.  Lymphadenopathy:     Cervical: No cervical adenopathy.  Skin:    General: Skin is warm and dry.     Findings: No erythema or rash.  Neurological:     Mental Status: She is alert and oriented to person, place, and time.  Psychiatric:        Mood and Affect: Mood and affect normal.        Behavior: Behavior normal.        Thought Content: Thought content normal.        Judgment: Judgment normal.    Lab Results  Component Value Date   WBC 10.2 11/01/2020   HGB 11.1 (L) 11/01/2020   HCT 36.0 11/01/2020   MCV 78.4 (L) 11/01/2020   PLT 450 (H) 11/01/2020     Chemistry      Component Value Date/Time   NA 139 11/01/2020 1317   K 4.2 11/01/2020 1317   CL 102 11/01/2020 1317   CO2 27 11/01/2020 1317   BUN 16 11/01/2020 1317   CREATININE 0.69 11/01/2020 1317      Component Value Date/Time   CALCIUM 10.2 11/01/2020 1317   ALKPHOS 368 (H)  11/01/2020 1317   AST 35 11/01/2020 1317   ALT 23 11/01/2020 1317   BILITOT 0.4 11/01/2020 1317      Impression and Plan: Annette James is a very nice 60 year old white female white female.  She has metastatic breast cancer.  Thankfully, that the disease is just in her bones.  As such, we really should be able  to get a lot of "mileage" from antiestrogen therapy.  I feel she would be a great candidate for the Faslodex/ribociclib combination.  I think this would be considered standard of care.  I think response should be over 75-80%.  We will be able to tell response just from the CA 27.29.  It will be interesting to see what the PET scan shows.  I do not see any problems with her having radiation therapy.  This will help with her pain.  I would think that we should be able to see a good improvement in her pain control within a month of starting therapy.  I told her that hot flashes might be the biggest side effect that she has because of estrogen depletion.  There is a history of breast cancer in the family.  We may have to check her for BRCA gene.  I think this would be unusual given that the tumor is ER positive.  However, she has 4 daughters and it would be nice to check to make sure that they are not susceptible.  I spent a good 45 minutes or so with Ms. Reihl and her daughter.  It is always nice to see Ms. Crenshaw again.  She is so nice.  Her daughter was very helpful.  A another daughter was on the mobile phone listening.  Hopefully, we can get treatment started next week.  She will get a loading dose of Faslodex every 2 weeks for 3 doses and then monthly.  I would like to see her back in February.  When we see her back in February, we will do Zometa.  We will also do the Faslodex monthly.   Volanda Napoleon, MD 1/21/20225:03 PM

## 2020-11-01 NOTE — Telephone Encounter (Signed)
Oral Oncology Patient Advocate Encounter  Received notification from [employer] that prior authorization for Annette James is required.  PA could not be submitted through CoverMyMeds.  Called PA line [phone] to start PA request.  Rep stated she would fax PA questionnaire to the office.  Oral Oncology Clinic will continue to follow.  [place] Patient [place] Phone [phone] Fax [phone] 11/05/2020 9:38 AM

## 2020-11-01 NOTE — Progress Notes (Signed)
Initial RN Navigator Patient Visit  Name: Annette James Date of Referral : Hospital Follow Up Diagnosis: Breast Cancer  Met with patient, and her daughter, prior to their visit with MD. [name] patient "Your Patient Navigator" handout which explains my role, areas in which I am able to help, and all the contact information for myself and the office. Also gave patient MD and Navigator business card. Reviewed with patient the general overview of expected course after initial diagnosis and time frame for all steps to be completed.  Patient currently lives with one of her four daughters, all very supportive. She was working before her injury and is unsure whether or not she wants to continue. She has FMLA papers today, which I gave to [name]. Gave her information on the [facility] which can help patient apply for disability. She would like to wait in this, but did get website info to look into program.   Patient will likely be treated with an oral agent. Contact infor for Alyson our specialty pharmacist given to patient and her family.   New patient packet given to patient which includes: orientation to office and staff; campus directory; education on My Chart and Advance Directives; and patient centered education on Breast Cancer.  Patient understands all follow up procedures and expectations. They have my number to reach out for any further clarification or additional needs.   Oncology Nurse Navigator Documentation  Oncology Nurse Navigator Flowsheets 11/01/2020  Abnormal Finding Date -  Confirmed Diagnosis Date 10/23/2020  Diagnosis Status Confirmed Diagnosis Complete  Phase of Treatment Chemo  Chemotherapy Pending- Reason: Authorization  Navigator Follow Up Date: 11/05/2020  Navigator Follow Up Reason: Other:  Navigator Location [facility]  Navigator Encounter Type Initial MedOnc  Telephone -  Patient Visit Type MedOnc  Treatment Phase Pre-Tx/Tx Discussion  Barriers/Navigation Needs  Coordination of Care;Education;Morbidities/Frailty  Education Newly Diagnosed Cancer Education;Other  Interventions Coordination of Care;Disability/FMLA;Education;Psycho-Social Support;Referrals  Acuity Level 2-Minimal Needs (1-2 Barriers Identified)  Referrals Other  Coordination of Care Other  Education Method Verbal;Written  Support Groups/Services Friends and Family  Time Spent with Patient 16

## 2020-11-02 ENCOUNTER — Other Ambulatory Visit: Payer: Self-pay | Admitting: Internal Medicine

## 2020-11-02 DIAGNOSIS — E519 Thiamine deficiency, unspecified: Secondary | ICD-10-CM

## 2020-11-02 DIAGNOSIS — D538 Other specified nutritional anemias: Secondary | ICD-10-CM

## 2020-11-02 LAB — VITAMIN B1: Vitamin B1 (Thiamine): 8 nmol/L (ref 8–30)

## 2020-11-02 LAB — CANCER ANTIGEN 27.29: CA 27.29: 160.9 U/mL — ABNORMAL HIGH (ref 0.0–38.6)

## 2020-11-02 MED ORDER — VITAMIN B-1 50 MG PO TABS: 50.0000 mg | ORAL_TABLET | Freq: Every day | ORAL | 1 refills | Status: DC

## 2020-11-04 ENCOUNTER — Other Ambulatory Visit: Payer: Self-pay | Admitting: Hematology & Oncology

## 2020-11-04 ENCOUNTER — Encounter: Payer: Self-pay | Admitting: *Deleted

## 2020-11-04 MED ORDER — PALBOCICLIB 125 MG PO CAPS: 125.0000 mg | ORAL_CAPSULE | Freq: Every day | ORAL | 4 refills | Status: DC

## 2020-11-04 NOTE — Telephone Encounter (Signed)
Oral Oncology Pharmacist Encounter  Received new prescription for Ibrance (palbocilib) for the treatment of newly diagnosed metastatic breast cancer in conjunction with fulvestrant, planned duration until disease progression or unacceptable drug toxicity.  CMP from 11/01/20 assessed, no relevant lab abnormalities. Prescription dose and frequency assessed.   Current medication list in Epic reviewed, no DDIs with palbociclib identified  Evaluated chart and no patient barriers to medication adherence identified.   Prescription has been e-scribed to [employer] for benefits analysis and approval.  Oral Oncology Clinic will continue to follow for insurance authorization, copayment issues, initial counseling and start date.  Darl Pikes, PharmD, BCPS, BCOP, CPP Hematology/Oncology Clinical Pharmacist Practitioner [facility] [phone]  11/04/2020 9:24 AM

## 2020-11-05 ENCOUNTER — Encounter: Payer: Self-pay | Admitting: *Deleted

## 2020-11-05 ENCOUNTER — Other Ambulatory Visit: Payer: Self-pay | Admitting: *Deleted

## 2020-11-05 ENCOUNTER — Ambulatory Visit (HOSPITAL_COMMUNITY)
Admission: RE | Admit: 2020-11-05 | Discharge: 2020-11-05 | Disposition: A | Discharge status: Home / Self Care | Payer: BC Managed Care – PPO | Source: Ambulatory Visit | Attending: Hematology & Oncology | Admitting: Hematology & Oncology

## 2020-11-05 ENCOUNTER — Other Ambulatory Visit: Payer: Self-pay

## 2020-11-05 DIAGNOSIS — N6310 Unspecified lump in the right breast, unspecified quadrant: Secondary | ICD-10-CM | POA: Insufficient documentation

## 2020-11-05 DIAGNOSIS — C50919 Malignant neoplasm of unspecified site of unspecified female breast: Secondary | ICD-10-CM | POA: Diagnosis not present

## 2020-11-05 DIAGNOSIS — C7951 Secondary malignant neoplasm of bone: Secondary | ICD-10-CM

## 2020-11-05 DIAGNOSIS — E519 Thiamine deficiency, unspecified: Secondary | ICD-10-CM

## 2020-11-05 LAB — GLUCOSE, CAPILLARY: Glucose-Capillary: 103 mg/dL — ABNORMAL HIGH (ref 70–99)

## 2020-11-05 MED ORDER — FLUDEOXYGLUCOSE F - 18 (FDG) INJECTION
11.7200 | Freq: Once | INTRAVENOUS | Status: AC | PRN
  Administered 2020-11-05: 11.72 via INTRAVENOUS

## 2020-11-05 NOTE — Progress Notes (Signed)
Oncotype sent on breast tissue per order of Dr. Marin Olp.

## 2020-11-05 NOTE — Progress Notes (Signed)
Gave patient the following message  Annette James, Annette Cobb, MD  P Onc Nurse Hp Call - as expected, the PET scan only lights up in the bones and in the right breast!! Laurey Arrow   Refill requests sent to Dr Marin Olp to have filled in the downstairs pharmacy. She will get them when she comes to the office to start treatment tomorrow.   Oncology Nurse Navigator Documentation  Oncology Nurse Navigator Flowsheets 11/05/2020  Abnormal Finding Date -  Confirmed Diagnosis Date -  Diagnosis Status -  Phase of Treatment -  Chemotherapy Pending- Reason: -  Navigator Follow Up Date: 11/06/2020  Navigator Follow Up Reason: Chemotherapy  Navigator Location [facility]  Navigator Encounter Type Diagnostic Results;Telephone  Telephone Outgoing Call  Patient Visit Type MedOnc  Treatment Phase Pre-Tx/Tx Discussion  Barriers/Navigation Needs Coordination of Care;Education;Morbidities/Frailty  Education Other  Interventions Education;Medication Assistance;Psycho-Social Support  Acuity Level 2-Minimal Needs (1-2 Barriers Identified)  Referrals -  Coordination of Care -  Education Method Verbal  Support Groups/Services Friends and Family  Time Spent with Patient 56

## 2020-11-06 ENCOUNTER — Encounter: Payer: Self-pay | Admitting: Internal Medicine

## 2020-11-06 ENCOUNTER — Inpatient Hospital Stay: Payer: BC Managed Care – PPO

## 2020-11-06 ENCOUNTER — Ambulatory Visit
Admission: RE | Admit: 2020-11-06 | Discharge: 2020-11-06 | Disposition: A | Discharge status: Home / Self Care | Payer: BC Managed Care – PPO | Source: Ambulatory Visit | Attending: Radiation Oncology | Admitting: Radiation Oncology

## 2020-11-06 ENCOUNTER — Encounter: Payer: Self-pay | Admitting: Radiation Oncology

## 2020-11-06 ENCOUNTER — Encounter: Payer: Self-pay | Admitting: General Practice

## 2020-11-06 ENCOUNTER — Other Ambulatory Visit: Payer: Self-pay

## 2020-11-06 ENCOUNTER — Encounter: Payer: Self-pay | Admitting: *Deleted

## 2020-11-06 ENCOUNTER — Other Ambulatory Visit (HOSPITAL_BASED_OUTPATIENT_CLINIC_OR_DEPARTMENT_OTHER): Payer: Self-pay | Admitting: Hematology & Oncology

## 2020-11-06 VITALS — BP 143/92 | HR 102 | Temp 98.0°F | Resp 20 | Ht 68.0 in | Wt 227.2 lb

## 2020-11-06 VITALS — BP 158/80 | HR 109 | Temp 98.7°F | Resp 17

## 2020-11-06 DIAGNOSIS — M5136 Other intervertebral disc degeneration, lumbar region: Secondary | ICD-10-CM | POA: Diagnosis not present

## 2020-11-06 DIAGNOSIS — J90 Pleural effusion, not elsewhere classified: Secondary | ICD-10-CM | POA: Diagnosis not present

## 2020-11-06 DIAGNOSIS — C7951 Secondary malignant neoplasm of bone: Secondary | ICD-10-CM

## 2020-11-06 DIAGNOSIS — Z79899 Other long term (current) drug therapy: Secondary | ICD-10-CM | POA: Insufficient documentation

## 2020-11-06 DIAGNOSIS — C50911 Malignant neoplasm of unspecified site of right female breast: Secondary | ICD-10-CM | POA: Diagnosis not present

## 2020-11-06 DIAGNOSIS — C50411 Malignant neoplasm of upper-outer quadrant of right female breast: Secondary | ICD-10-CM | POA: Diagnosis not present

## 2020-11-06 DIAGNOSIS — M50222 Other cervical disc displacement at C5-C6 level: Secondary | ICD-10-CM | POA: Insufficient documentation

## 2020-11-06 DIAGNOSIS — Z5111 Encounter for antineoplastic chemotherapy: Secondary | ICD-10-CM | POA: Diagnosis not present

## 2020-11-06 DIAGNOSIS — Z800 Family history of malignant neoplasm of digestive organs: Secondary | ICD-10-CM | POA: Diagnosis not present

## 2020-11-06 DIAGNOSIS — C50919 Malignant neoplasm of unspecified site of unspecified female breast: Secondary | ICD-10-CM

## 2020-11-06 DIAGNOSIS — Z170 Estrogen receptor positive status [ER+]: Secondary | ICD-10-CM | POA: Diagnosis not present

## 2020-11-06 DIAGNOSIS — M5124 Other intervertebral disc displacement, thoracic region: Secondary | ICD-10-CM | POA: Insufficient documentation

## 2020-11-06 MED ORDER — FULVESTRANT 250 MG/5ML IM SOLN
500.0000 mg | Freq: Once | INTRAMUSCULAR | Status: AC
Start: 2020-11-06 — End: 2020-11-06
  Administered 2020-11-06: 500 mg via INTRAMUSCULAR

## 2020-11-06 MED ORDER — PALBOCICLIB 125 MG PO TABS: 125.0000 mg | ORAL_TABLET | Freq: Every day | ORAL | 4 refills | Status: DC

## 2020-11-06 MED ORDER — VITAMIN B-1 50 MG PO TABS: 50.0000 mg | ORAL_TABLET | Freq: Every day | ORAL | 1 refills | Status: DC

## 2020-11-06 MED ORDER — FULVESTRANT 250 MG/5ML IM SOLN
INTRAMUSCULAR | Status: AC
  Filled 2020-11-06: qty 10

## 2020-11-06 MED ORDER — OXYCODONE HCL 10 MG PO TABS: 10.0000 mg | ORAL_TABLET | Freq: Four times a day (QID) | ORAL | 0 refills | Status: DC | PRN

## 2020-11-06 NOTE — Patient Instructions (Signed)
Fulvestrant injection What is this medicine? FULVESTRANT (ful VES trant) blocks the effects of estrogen. It is used to treat breast cancer. This medicine may be used for other purposes; ask your health care provider or pharmacist if you have questions. COMMON BRAND NAME(S): FASLODEX What should I tell my health care provider before I take this medicine? They need to know if you have any of these conditions:  bleeding disorders  liver disease  low blood counts, like low white cell, platelet, or red cell counts  an unusual or allergic reaction to fulvestrant, other medicines, foods, dyes, or preservatives  pregnant or trying to get pregnant  breast-feeding How should I use this medicine? This medicine is for injection into a muscle. It is usually given by a health care professional in a hospital or clinic setting. Talk to your pediatrician regarding the use of this medicine in children. Special care may be needed. Overdosage: If you think you have taken too much of this medicine contact a poison control center or emergency room at once. NOTE: This medicine is only for you. Do not share this medicine with others. What if I miss a dose? It is important not to miss your dose. Call your doctor or health care professional if you are unable to keep an appointment. What may interact with this medicine?  medicines that treat or prevent blood clots like warfarin, enoxaparin, dalteparin, apixaban, dabigatran, and rivaroxaban This list may not describe all possible interactions. Give your health care provider a list of all the medicines, herbs, non-prescription drugs, or dietary supplements you use. Also tell them if you smoke, drink alcohol, or use illegal drugs. Some items may interact with your medicine. What should I watch for while using this medicine? Your condition will be monitored carefully while you are receiving this medicine. You will need important blood work done while you are taking  this medicine. Do not become pregnant while taking this medicine or for at least 1 year after stopping it. Women of child-bearing potential will need to have a negative pregnancy test before starting this medicine. Women should inform their doctor if they wish to become pregnant or think they might be pregnant. There is a potential for serious side effects to an unborn child. Men should inform their doctors if they wish to father a child. This medicine may lower sperm counts. Talk to your health care professional or pharmacist for more information. Do not breast-feed an infant while taking this medicine or for 1 year after the last dose. What side effects may I notice from receiving this medicine? Side effects that you should report to your doctor or health care professional as soon as possible:  allergic reactions like skin rash, itching or hives, swelling of the face, lips, or tongue  feeling faint or lightheaded, falls  pain, tingling, numbness, or weakness in the legs  signs and symptoms of infection like fever or chills; cough; flu-like symptoms; sore throat  vaginal bleeding Side effects that usually do not require medical attention (report to your doctor or health care professional if they continue or are bothersome):  aches, pains  constipation  diarrhea  headache  hot flashes  nausea, vomiting  pain at site where injected  stomach pain This list may not describe all possible side effects. Call your doctor for medical advice about side effects. You may report side effects to FDA at 1-800-FDA-1088. Where should I keep my medicine? This drug is given in a hospital or clinic and will   not be stored at home. NOTE: This sheet is a summary. It may not cover all possible information. If you have questions about this medicine, talk to your doctor, pharmacist, or health care provider.  2021 Elsevier/Gold Standard (2018-01-06 11:34:41)  

## 2020-11-06 NOTE — Progress Notes (Signed)
Oncology Nurse Navigator Documentation  Oncology Nurse Navigator Flowsheets 11/06/2020  Abnormal Finding Date -  Confirmed Diagnosis Date -  Diagnosis Status -  Planned Course of Treatment Chemotherapy  Phase of Treatment Chemo  Chemotherapy Pending- Reason: -  Chemotherapy Actual Start Date: 11/06/2020  Navigator Follow Up Date: 11/20/2020  Navigator Follow Up Reason: Follow-up Appointment;Chemotherapy  Navigator Location [facility]  Navigator Encounter Type Treatment  Telephone -  Treatment Initiated Date 11/06/2020  Patient Visit Type MedOnc  Treatment Phase First Chemo Tx  Barriers/Navigation Needs Coordination of Care;Education;Morbidities/Frailty  Education -  Interventions Psycho-Social Support  Acuity Level 2-Minimal Needs (1-2 Barriers Identified)  Referrals -  Coordination of Care -  Education Method -  Support Groups/Services Friends and Family  Time Spent with Patient 32

## 2020-11-06 NOTE — Telephone Encounter (Signed)
Oral Oncology Patient Advocate Encounter  Prior Authorization for [facility] has been approved.    PA# [id number] Effective dates: 11/06/20 through 11/06/21  Patient must use [employer].  Patient has Pharmacist, community and is eligible for a copay card if her copay is unaffordable.  Oral Oncology Clinic will continue to follow.  [place] [facility] [place] Phone [phone] Fax [phone] 11/06/2020 4:15 PM

## 2020-11-06 NOTE — Telephone Encounter (Signed)
Oral Chemotherapy Pharmacist Encounter  Due to insurance restriction the medication could not be filled at [facility]. Prescription has been e-scribed to [facility]  Supportive information was faxed to [facility]. We will continue to follow medication access.   Called and notified patient, provided with new pharmacy number.  Patient Education I spoke with patient for overview of new oral chemotherapy medication: Ibrance (palbocilib) for the treatment of newly diagnosed metastatic breast cancer in conjunction with fulvestrant, planned duration until disease progression or unacceptable drug toxicity.   Pt is doing well. Counseled patient on administration, dosing, side effects, monitoring, drug-food interactions, safe handling, storage, and disposal. Patient will take 1 tablet (125 mg total) by mouth daily. Take for 21 days on, 7 days off, repeat every 28 days.  Side effects include but not limited to: decreased wbc, fatigue, N/V.    Reviewed with patient importance of keeping a medication schedule and plan for any missed doses.  After discussion with patient no patient barriers to medication adherence identified.   Annette James voiced understanding and appreciation. All questions answered. Medication handout provided.  Provided patient with [facility] phone number. Patient knows to call the office with questions or concerns. Oral Chemotherapy Navigation Clinic will continue to follow.  Darl Pikes, PharmD, BCPS, BCOP, CPP Hematology/Oncology Clinical Pharmacist Practitioner [facility] [phone]  11/06/2020 4:22 PM

## 2020-11-06 NOTE — Progress Notes (Signed)
[facility] Psychosocial Distress Screening Clinical Social Work  Clinical Social Work was referred by distress screening protocol.  The patient scored a 5 on the Psychosocial Distress Thermometer which indicates moderate distress. Clinical Social Worker contacted patient by phone to assess for distress and other psychosocial needs. Difficulty w insurance approving medications, they have had to struggle to get insurance to pay for needed medications.  This will need to be addressed w the oncologist's office.  CSW and patient discussed common feeling and emotions when being diagnosed with cancer, and the importance of support during treatment. CSW informed patient of the support team and support services at [facility]. CSW provided contact information and encouraged patient to call with any questions or concerns.  Emailed information on [place] and [employer].  She has strong support from daughters, no needs at this time.   ONCBCN DISTRESS SCREENING 11/06/2020  Screening Type Initial Screening  Distress experienced in past week (1-10) 5  Practical problem type Insurance    Clinical Social Worker follow up needed: No.  If yes, follow up plan:  Beverely Pace, [place], LCSW Clinical Social Worker Phone:  [phone]

## 2020-11-06 NOTE — Progress Notes (Signed)
Radiation Oncology         [phone] ________________________________  Initial Outpatient Consultation  Name: Annette James MRN: [id number]  Date: 11/06/2020  DOB: 04/21/1961  [id number], Arvid Right, MD  Volanda Napoleon, MD   REFERRING PHYSICIAN: Volanda Napoleon, MD  DIAGNOSIS: The primary encounter diagnosis was Metastatic cancer to spine [facility]). A diagnosis of Breast cancer metastasized to bone, unspecified laterality [facility]) was also pertinent to this visit.  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 female who is seen as a courtesy of Dr. Marin Olp for an opinion concerning radiation therapy as part of management for her recently diagnosed metastatic breast cancer. Today, she is accompanied by her daughter. The patient presented to the ED on 10/18/2020 for evaluation status post fall secondary to dizziness. CT scan of head and cervical spine at that time showed innumerable axial and appendicular lytic lesions suggesting metastases.  MRI of the brain on 10/19/2020 showed multiple lesions throughout the calvarium and upper cervical spine, consistent with osseous metastases and/or myeloma. There was no extra osseous extension of disease, nor was there any evidence for intracranial metastatic disease within the brain itself.  Metastatic bone survey on 10/19/2020 showed widespread bony lytic lesions consistent with lytic metastases versus multiple myeloma.  MRI of cervical spine on 10/19/2020 showed multifocal osseous metastatic disease without significant epidural disease.  CT scan of chest/abdomen/pelvis on 10/19/2020 showed an irregular mass in the right upper breast that measured up to 3.3 cm, consistent with primary breast malignancy. It also showed innumerable lytic and sclerotic lesions throughout the near entirety of the axial and appendicular skeleton, worrisome  for diffuse osseous metastatic disease. Additionally, there were pathology compression fracture deformities of T8 and T11 as well as a possible non-displaced pathologic fracture through the right sacrum and a possible pathologic fracture of the right inferior pubic ramus. Finally, there were scattered, non-specific, and favored to be benign pulmonary nodules that measured up to 3 mm, a 5.2 cm left ovarian cystic mass, and a metastatic lesion of T11 that extended through the posterior aspect of the vertebral body and possibly into the epidural space; a lesion in the sacrum extended through the posterior aspect of the bone.  MRI of thoracic and lumbar spine on 10/20/2020 showed widespread osseous metastatic disease involving the thoracic and lumbosacral spine without significant epidural tumor or cord compromise.  Bilateral diagnostic mammogram with right breast ultrasound on 10/24/2020 showed a palpable mass in the right breast at the 12 o'clock position with associated calcifications all together measuring up to 6.8 cm. There was no lymphadenopathy identified in the right axilla.  Biopsy of the right breast mass on 10/25/2020 showed grade 2 invasive ductal carcinoma with ductal carcinoma in situ. Prognostic indicators were significant for estrogen receptor >95% positive and progesterone receptor >95% positive, both with strong staining intensities. Proliferation marker Ki67: 15%. Her2 negative.  PET scan on 11/05/2020 showed widespread skeletal metastasis involving the axillary and appendicular skeleton with hypermetabolic lesions associated with lytic and sclerotic change. There was also noted to be a hypermetabolic mass in the medial aspect of the right breast that was consistent with primary breast carcinoma. There was no evidence of nodal metastasis in the thorax or visceral metastasis.  Subsequently, the patient was seen by Dr. Marin Olp and is to begin chemotherapy with Faslodex and Ribociclib  today.  PREVIOUS RADIATION THERAPY: No  PAST MEDICAL HISTORY:  Past Medical History:  Diagnosis Date  . Breast cancer metastasized to bone, unspecified laterality ([place]) 11/01/2020  . Chicken pox   . Goals of care, counseling/discussion 10/19/2020  . Metastatic cancer to spine ([place]) 10/19/2020    PAST SURGICAL HISTORY: Past Surgical History:  Procedure Laterality Date  . APPENDECTOMY    . CESAREAN SECTION     x 4  . CHOLECYSTECTOMY      FAMILY HISTORY:  Family History  Problem Relation Age of Onset  . Breast cancer Mother   . Hypertension Mother   . Colon cancer Father   . Hypertension Father   . Alcoholism Brother   . Alcoholism Brother     SOCIAL HISTORY:  Social History   Tobacco Use  . Smoking status: Never Smoker  . Smokeless tobacco: Never Used    ALLERGIES: No Known Allergies  MEDICATIONS:  Current Outpatient Medications  Medication Sig Dispense Refill  . amLODipine (NORVASC) 5 MG tablet Take 1 tablet (5 mg total) by mouth daily. 30 tablet 1  . ascorbic acid (VITAMIN C) 500 MG tablet Take 500 mg by mouth daily.    . fentaNYL (DURAGESIC) 25 MCG/HR Place 1 patch onto the skin every 3 (three) days. 10 patch 0  . LORazepam (ATIVAN) 0.5 MG tablet Take 30 minutes before scans or procedures as needed for anxiety. 15 tablet 0  . metaxalone (SKELAXIN) 400 MG tablet Take 1 tablet (400 mg total) by mouth 3 (three) times daily. 60 tablet 0  . Multiple Vitamins-Minerals (ONE-A-DAY WOMENS 50 PLUS) TABS Take 1 tablet by mouth daily.    . Oxycodone HCl 10 MG TABS Take 1 tablet (10 mg total) by mouth every 6 (six) hours as needed. 30 tablet 0  . Azilsartan-Chlorthalidone (EDARBYCLOR) 40-12.5 MG TABS Take 1 tablet by mouth daily. (Patient not taking: Reported on 11/06/2020) 90 tablet 0  . palbociclib (IBRANCE) 125 MG tablet Take 1 tablet (125 mg total) by mouth daily. Take for 21 days on, 7 days off, repeat every 28 days. 21 tablet 4  . thiamine (VITAMIN B-1) 50 MG tablet Take 1  tablet (50 mg total) by mouth daily. (Patient not taking: Reported on 11/06/2020) 90 tablet 1   No current facility-administered medications for this encounter.    REVIEW OF SYSTEMS:  A 10+ POINT REVIEW OF SYSTEMS WAS OBTAINED including neurology, dermatology, psychiatry, cardiac, respiratory, lymph, extremities, GI, GU, musculoskeletal, constitutional, reproductive, HEENT.  Patient reports not most significant pain in the left pelvis region.  She also reports pain in the left upper leg region particularly with standing.  Patient also reports pain in the bilateral shoulder area but not the intensity of her left pelvic pain.  She denies any numbness or tingling in her lower extremities or focal motor weakness.   PHYSICAL EXAM:  height is '5\' 8"'  (1.727 m) and weight is 227 lb 4 oz (103.1 kg). Her temporal temperature is 98 F (36.7 C). Her blood pressure is 143/92 (abnormal) and her pulse is 102 (abnormal). Her respiration is 20 and oxygen saturation is 98%.   General: Alert and oriented, in no acute distress, she remains in a wheelchair in light of her left pelvis and upper leg pain HEENT: Head is normocephalic. Extraocular movements are intact.  Neck: Neck is supple, no palpable cervical or supraclavicular lymphadenopathy. Heart: Regular in rate and rhythm with no murmurs, rubs, or gallops. Chest: Clear to auscultation bilaterally, with no rhonchi, wheezes, or rales. Abdomen: Soft, nontender, nondistended, with  no rigidity or guarding. Extremities: No cyanosis or edema. Lymphatics: see Neck Exam Skin: No concerning lesions. Musculoskeletal: symmetric strength and muscle tone throughout. Neurologic: Cranial nerves II through XII are grossly intact. No obvious focalities. Speech is fluent. Coordination is intact. Psychiatric: Judgment and insight are intact. Affect is appropriate.   ECOG = 2  0 - Asymptomatic (Fully active, able to carry on all predisease activities without restriction)  1 -  Symptomatic but completely ambulatory (Restricted in physically strenuous activity but ambulatory and able to carry out work of a light or sedentary nature. For example, light housework, office work)  2 - Symptomatic, <50% in bed during the day (Ambulatory and capable of all self care but unable to carry out any work activities. Up and about more than 50% of waking hours)  3 - Symptomatic, >50% in bed, but not bedbound (Capable of only limited self-care, confined to bed or chair 50% or more of waking hours)  4 - Bedbound (Completely disabled. Cannot carry on any self-care. Totally confined to bed or chair)  5 - Death   Eustace Pen MM, Creech RH, Tormey DC, et al. [phone]). "Toxicity and response criteria of the [facility]". [place] Oncol. 5 (6): 649-55  LABORATORY DATA:  Lab Results  Component Value Date   WBC 10.2 11/01/2020   HGB 11.1 (L) 11/01/2020   HCT 36.0 11/01/2020   MCV 78.4 (L) 11/01/2020   PLT 450 (H) 11/01/2020   NEUTROABS 7.1 11/01/2020   Lab Results  Component Value Date   NA 139 11/01/2020   K 4.2 11/01/2020   CL 102 11/01/2020   CO2 27 11/01/2020   GLUCOSE 118 (H) 11/01/2020   CREATININE 0.69 11/01/2020   CALCIUM 10.2 11/01/2020      RADIOGRAPHY: DG Ribs Unilateral W/Chest Right  Result Date: 10/18/2020 CLINICAL DATA:  Pain after fall. EXAM: RIGHT RIBS AND CHEST - 3+ VIEW COMPARISON:  None. FINDINGS: No fracture or other bone lesions are seen involving the ribs. There is no evidence of pneumothorax or pleural effusion. Both lungs are clear. Heart size and mediastinal contours are within normal limits. IMPRESSION: Negative. Electronically Signed   By: Dorise Bullion III M.D   On: 10/18/2020 19:02   CT Head Wo Contrast  Result Date: 10/18/2020 CLINICAL DATA:  Status post fall, dizziness. Discussion with PA, no known malignancy. EXAM: CT HEAD WITHOUT CONTRAST CT CERVICAL SPINE WITHOUT CONTRAST TECHNIQUE: Multidetector CT imaging of the head and  cervical spine was performed following the standard protocol without intravenous contrast. Multiplanar CT image reconstructions of the cervical spine were also generated. COMPARISON:  None. FINDINGS: CT HEAD FINDINGS Brain: No evidence of large-territorial acute infarction. No parenchymal hemorrhage. No mass lesion. No extra-axial collection. No mass effect or midline shift. No hydrocephalus. Basilar cisterns are patent. Vascular: No hyperdense vessel. Skull: Suggestion of several calvarial lytic lesions. No acute fracture or focal lesion. Sinuses/Orbits: Paranasal sinuses and mastoid air cells are clear. The orbits are unremarkable. Other: None. CT CERVICAL SPINE FINDINGS Alignment: Normal. Skull base and vertebrae: No acute fracture. Innumerable lytic lesions of the cervical spine involving the vertebral bodies as well as posterior elements. Degenerative changes of the spine and the C5-C6 level. Soft tissues and spinal canal: No prevertebral fluid or swelling. No visible canal hematoma. Disc levels:  Maintained. Upper chest: Unremarkable. Other: None. IMPRESSION: 1. 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 No definite associated pathologic fracture of the cervical spine. Recommend workup for malignancy. 2. No  acute intracranial abnormality. Please note markedly limited evaluation for intracranial metastases. Consider MRI brain with and without contrast for further evaluation given above findings. 3. No acute displaced fracture or traumatic listhesis of the cervical spine. These results were called by telephone at the time of interpretation on 10/18/2020 at 7:26 pm to provider PA [facility] [place] , who verbally acknowledged these results. Electronically Signed   By: Iven Finn M.D.   On: 10/18/2020 19:31   CT Cervical Spine Wo Contrast  Result Date: 10/18/2020 CLINICAL DATA:  Status post fall, dizziness. Discussion with PA, no known malignancy. EXAM: CT HEAD WITHOUT CONTRAST CT  CERVICAL SPINE WITHOUT CONTRAST TECHNIQUE: Multidetector CT imaging of the head and cervical spine was performed following the standard protocol without intravenous contrast. Multiplanar CT image reconstructions of the cervical spine were also generated. COMPARISON:  None. FINDINGS: CT HEAD FINDINGS Brain: No evidence of large-territorial acute infarction. No parenchymal hemorrhage. No mass lesion. No extra-axial collection. No mass effect or midline shift. No hydrocephalus. Basilar cisterns are patent. Vascular: No hyperdense vessel. Skull: Suggestion of several calvarial lytic lesions. No acute fracture or focal lesion. Sinuses/Orbits: Paranasal sinuses and mastoid air cells are clear. The orbits are unremarkable. Other: None. CT CERVICAL SPINE FINDINGS Alignment: Normal. Skull base and vertebrae: No acute fracture. Innumerable lytic lesions of the cervical spine involving the vertebral bodies as well as posterior elements. Degenerative changes of the spine and the C5-C6 level. Soft tissues and spinal canal: No prevertebral fluid or swelling. No visible canal hematoma. Disc levels:  Maintained. Upper chest: Unremarkable. Other: None. IMPRESSION: 1. 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 No definite associated pathologic fracture of the cervical spine. Recommend workup for malignancy. 2. No acute intracranial abnormality. Please note markedly limited evaluation for intracranial metastases. Consider MRI brain with and without contrast for further evaluation given above findings. 3. No acute displaced fracture or traumatic listhesis of the cervical spine. These results were called by telephone at the time of interpretation on 10/18/2020 at 7:26 pm to provider PA [facility] [place] , who verbally acknowledged these results. Electronically Signed   By: Iven Finn M.D.   On: 10/18/2020 19:31   MR BRAIN W WO CONTRAST  Result Date: 10/19/2020 CLINICAL DATA:  Initial evaluation for  metastatic disease. EXAM: MRI HEAD WITHOUT AND WITH CONTRAST TECHNIQUE: Multiplanar, multiecho pulse sequences of the brain and surrounding structures were obtained without and with intravenous contrast. CONTRAST:  30m GADAVIST GADOBUTROL 1 MMOL/ML IV SOLN COMPARISON:  Prior CT from 10/18/2020. FINDINGS: Brain: Cerebral volume within normal limits for age. Mild scattered patchy T2/FLAIR hyperintensity within the periventricular, deep, and subcortical white matter both cerebral hemispheres, nonspecific, but most commonly related to chronic microvascular ischemic disease, mild for age. No abnormal foci of restricted diffusion to suggest acute or subacute ischemia. Gray-white matter differentiation maintained. No encephalomalacia to suggest chronic cortical infarction. No acute or chronic intracranial hemorrhage. No mass lesion, midline shift or mass effect. No hydrocephalus or extra-axial fluid collection. Pituitary gland and suprasellar region within normal limits. Midline structures intact. No abnormal enhancement within the brain. No evidence for intracranial metastatic disease within the brain itself. Vascular: Major intracranial vascular flow voids are maintained. Skull and upper cervical spine: Craniocervical junction normal. Diffusely abnormal signal intensity seen throughout the visualized bone marrow with multiple scattered lesions seen throughout the calvarium and visualized upper cervical spine, consistent with osseous metastases and/or myeloma. Most prominent of these present at the left frontal calvarium and measures 1.7 cm (series  11, image 63). No significant extra osseous extension of disease. No scalp soft tissue abnormality. Sinuses/Orbits: Globes and orbital soft tissues within normal limits. Mild scattered mucosal thickening noted within the paranasal sinuses. No air-fluid levels to suggest acute sinusitis. Trace bilateral mastoid effusions, of doubtful significance. Other: None. IMPRESSION: 1.  Multiple scattered lesions throughout the calvarium and upper cervical spine, consistent with osseous metastases and/or myeloma. No extra osseous extension of disease. 2. No other acute intracranial abnormality. No evidence for intracranial metastatic disease within the brain itself. 3. Mild cerebral white matter disease, nonspecific, but most commonly related to chronic microvascular ischemic disease. Electronically Signed   By: Jeannine Boga M.D.   On: 10/19/2020 04:20   MR CERVICAL SPINE W WO CONTRAST  Result Date: 10/19/2020 CLINICAL DATA:  Metastatic cancer of unknown primary, abnormal cervical spine CT, staging EXAM: MRI CERVICAL SPINE WITHOUT AND WITH CONTRAST TECHNIQUE: Multiplanar and multiecho pulse sequences of the cervical spine, to include the craniocervical junction and cervicothoracic junction, were obtained without and with intravenous contrast. CONTRAST:  108m GADAVIST GADOBUTROL 1 MMOL/ML IV SOLN COMPARISON:  Correlation made with CT 10/18/2020 FINDINGS: Alignment: No significant listhesis. Vertebrae: Abnormal marrow signal throughout visualized osseous structures including the cervical spine with areas of enhancement. Minor extraosseous extension is present. No significant epidural disease. Cord: No abnormal signal.  No abnormal intrathecal enhancement. Posterior Fossa, vertebral arteries, paraspinal tissues: Intracranial findings better evaluated on same day dedicated imaging. Trace prevertebral edema. Otherwise unremarkable. Disc levels: C2-C3:  No canal or foraminal stenosis. C3-C4: Punctate central annular fissure. No canal or foraminal stenosis. C4-C5:  No canal or foraminal stenosis. C5-C6: Disc bulge with endplate osteophytes. Minor canal stenosis. Minor foraminal stenosis. C6-C7: Disc bulge with endplate osteophytes. No canal stenosis. No right foraminal stenosis. Minor right and mild left foraminal stenosis. C7-T1: Right foraminal disc protrusion with endplate osteophytes. No  canal or left foraminal stenosis. Mild right foraminal stenosis. IMPRESSION: Multifocal osseous metastatic disease. No significant epidural disease. Electronically Signed   By: PMacy MisM.D.   On: 10/19/2020 12:53   MR THORACIC SPINE W WO CONTRAST  Result Date: 10/20/2020 CLINICAL DATA:  Metastatic breast cancer. EXAM: MRI THORACIC AND LUMBAR SPINE WITHOUT AND WITH CONTRAST TECHNIQUE: Multiplanar and multiecho pulse sequences of the thoracic and lumbar spine were obtained without and with intravenous contrast. CONTRAST:  161mGADAVIST GADOBUTROL 1 MMOL/ML IV SOLN COMPARISON:  CT chest, abdomen, and pelvis dated October 19, 2020. FINDINGS: MRI THORACIC SPINE FINDINGS Alignment:  Physiologic. Vertebrae: Innumerable enhancing lesions throughout the visualized thoracic spine and ribs. Chronic mild pathologic fractures of the T8 and T11 superior endplates. No epidural tumor. Cord:  Normal signal and morphology.  No intradural enhancement. Paraspinal and other soft tissues: Trace bilateral pleural effusions. Disc levels: Mild degenerative disc disease from T5-T6 through T11-T12. No spinal canal or neuroforaminal stenosis at any level. MRI LUMBAR SPINE FINDINGS Segmentation:  Standard. Alignment:  Physiologic. Vertebrae: Innumerable enhancing lesions throughout the visualized lumbosacral spine as well as the bilateral iliac bones. Chronic mild pathologic fracture of the S1 superior endplate. No epidural tumor. Conus medullaris: Extends to the L2 level and appears normal. No intradural enhancement. Paraspinal and other soft tissues: Negative. Disc levels: Mild degenerative disc disease at T12-L1 and L2-L3. Central disc protrusion at T6-T7 flattens the ventral cord. No spinal canal or neuroforaminal stenosis at any level. IMPRESSION: 1. Widespread osseous metastatic disease involving the thoracic and lumbosacral spine. No significant epidural tumor or cord compromise. 2. Chronic mild pathologic fractures  of the T8,  T11, and S1 superior endplates. 3. Trace bilateral pleural effusions. Electronically Signed   By: Titus Dubin M.D.   On: 10/20/2020 15:40   MR Lumbar Spine W Wo Contrast  Result Date: 10/20/2020 CLINICAL DATA:  Metastatic breast cancer. EXAM: MRI THORACIC AND LUMBAR SPINE WITHOUT AND WITH CONTRAST TECHNIQUE: Multiplanar and multiecho pulse sequences of the thoracic and lumbar spine were obtained without and with intravenous contrast. CONTRAST:  10m GADAVIST GADOBUTROL 1 MMOL/ML IV SOLN COMPARISON:  CT chest, abdomen, and pelvis dated October 19, 2020. FINDINGS: MRI THORACIC SPINE FINDINGS Alignment:  Physiologic. Vertebrae: Innumerable enhancing lesions throughout the visualized thoracic spine and ribs. Chronic mild pathologic fractures of the T8 and T11 superior endplates. No epidural tumor. Cord:  Normal signal and morphology.  No intradural enhancement. Paraspinal and other soft tissues: Trace bilateral pleural effusions. Disc levels: Mild degenerative disc disease from T5-T6 through T11-T12. No spinal canal or neuroforaminal stenosis at any level. MRI LUMBAR SPINE FINDINGS Segmentation:  Standard. Alignment:  Physiologic. Vertebrae: Innumerable enhancing lesions throughout the visualized lumbosacral spine as well as the bilateral iliac bones. Chronic mild pathologic fracture of the S1 superior endplate. No epidural tumor. Conus medullaris: Extends to the L2 level and appears normal. No intradural enhancement. Paraspinal and other soft tissues: Negative. Disc levels: Mild degenerative disc disease at T12-L1 and L2-L3. Central disc protrusion at T6-T7 flattens the ventral cord. No spinal canal or neuroforaminal stenosis at any level. IMPRESSION: 1. Widespread osseous metastatic disease involving the thoracic and lumbosacral spine. No significant epidural tumor or cord compromise. 2. Chronic mild pathologic fractures of the T8, T11, and S1 superior endplates. 3. Trace bilateral pleural effusions.  Electronically Signed   By: WTitus DubinM.D.   On: 10/20/2020 15:40   CT CHEST ABDOMEN PELVIS W CONTRAST  Result Date: 10/19/2020 CLINICAL DATA:  Breast cancer staging. EXAM: CT CHEST, ABDOMEN, AND PELVIS WITH CONTRAST TECHNIQUE: Multidetector CT imaging of the chest, abdomen and pelvis was performed following the standard protocol during bolus administration of intravenous contrast. CONTRAST:  1086mOMNIPAQUE IOHEXOL 300 MG/ML  SOLN COMPARISON:  October 18, 2020. FINDINGS: CT CHEST FINDINGS Cardiovascular: No significant vascular findings. Normal heart size. Trace pericardial effusion. Mediastinum/Nodes: No axillary adenopathy. No suspicious internal mammary lymph nodes. No mediastinal adenopathy. No hilar adenopathy. Visualized thyroid is unremarkable. Lungs/Pleura: No pneumothorax. Trace RIGHT subpulmonic effusion. Bibasilar atelectasis. There are scattered pulmonary nodules with representative nodules as follows: Nodule 1: RIGHT middle lobe pulmonary nodule adjacent to the fissure measures 3 mm (series 5, image 67). Nodule 2: RIGHT upper lobe subpleural pulmonary nodule measures 3 mm (series 5, image 29). Musculoskeletal: Within the RIGHT upper breast, there is an irregular mass which measures 3.1 by 3.1 by 3.3 cm (series 3, image 21). There are innumerable lytic and sclerotic lesions throughout the axial and appendicular skeleton. They involve every single vertebral body level, the sternum, scapula, clavicles, humeral heads and multifocal bilateral ribs. There is a moderate pathologic compression fracture deformity of T8 and a mild pathologic compression fracture deformity of T11. Lesion involving the posterior aspect of T11 extends to the epidural space (series 3, image 48). There are pathologic fractures through several of the rib lesions. CT ABDOMEN PELVIS FINDINGS Hepatobiliary: No focal liver abnormality is seen. Status post cholecystectomy. No biliary dilatation. Pancreas: Unremarkable. No  pancreatic ductal dilatation or surrounding inflammatory changes. Spleen: Normal in size without focal abnormality.  Splenule. Adrenals/Urinary Tract: Adrenal glands are unremarkable. Kidneys are normal, without renal calculi, focal  lesion, or hydronephrosis. Bladder is unremarkable. Stomach/Bowel: Stomach is unremarkable. No evidence of bowel obstruction. No evidence of bowel inflammation. Status post appendectomy. Vascular/Lymphatic: No significant aortic atherosclerosis. No suspicious intra-abdominal lymph nodes. Reproductive: Fibroid uterus. There is a cystic structure of the LEFT ovary versus 2 adjacent cystic structures. It spans 5.2 x 2.7 by 3.7 cm (series 3, image 101). RIGHT adnexa is unremarkable. Other: No free air or free fluid. Musculoskeletal: Innumerable lytic and sclerotic lesions throughout the entirety of the lumbar spine, pelvis and bilateral femurs. Expansile lesion of the LEFT iliac crest. There is a possible pathologic fracture through the RIGHT sacrum (series 7, image 66). A lesion of the sacrum likely extends through the vertebral body into the residual spinal canal. Possible pathologic fracture of the RIGHT inferior pubic ramus (series 3, image 119). IMPRESSION: 1. Irregular mass in the RIGHT upper breast measuring up to 3.3 cm, consistent with primary breast malignancy. 2. Innumerable lytic and sclerotic lesions throughout the near entirety of the axial and appendicular skeleton, worrisome for diffuse osseous metastatic disease. There are pathologic compression fracture deformities of T8 and T11. 3. There is a possible nondisplaced pathologic fracture through the RIGHT sacrum. There is a possible pathologic fracture of the RIGHT inferior pubic ramus. 4. A metastatic lesion of T11 extends through the posterior aspect of the vertebral body and possibly into the epidural space. A lesion in the sacrum extends through the posterior aspect of the bone. If concern for epidural extension, consider  further evaluation with dedicated contrast enhanced MRI. 5. Scattered pulmonary nodules measuring up to 3 mm, nonspecific, but favored benign. 6. There is a 5.2 cm LEFT ovarian cystic mass. Recommend further evaluation with dedicated pelvic ultrasound when clinically appropriate. 7. Trace pericardial effusion. Electronically Signed   By: Valentino Saxon MD   On: 10/19/2020 20:35   NM PET Image Initial (PI) Skull Base To Thigh  Result Date: 11/05/2020 CLINICAL DATA:  Initial treatment strategy for breast carcinoma. Skeletal metastasis. EXAM: NUCLEAR MEDICINE PET SKULL BASE TO THIGH TECHNIQUE: 11.72 mCi F-18 FDG was injected intravenously. Full-ring PET imaging was performed from the skull base to thigh after the radiotracer. CT data was obtained and used for attenuation correction and anatomic localization. Fasting blood glucose: 103 mg/dl COMPARISON:  None. FINDINGS: Mediastinal blood pool activity: SUV max 2.5 Liver activity: SUV max NA NECK: No hypermetabolic lymph nodes in the neck. Incidental CT findings: none CHEST: Hypermetabolic mass in the medial RIGHT breast measures 3.2 cm with SUV max equal 5.4. No hypermetabolic RIGHT axillary lymph nodes. No hypermetabolic mediastinal lymph nodes or internal mammary lymph nodes. No hypermetabolic pulmonary nodules. Incidental CT findings: none ABDOMEN/PELVIS: No abnormal hypermetabolic activity within the liver, pancreas, adrenal glands, or spleen. No hypermetabolic lymph nodes in the abdomen or pelvis. Incidental CT findings: Postcholecystectomy. Uterus normal. Cystic lesion of the LEFT ovary without metabolic activity. SKELETON: There is widespread intensely hypermetabolic activity within the skeleton. The axillary and appendicular skeleton are involved. Lesions involve the pelvis, spine, shoulder girdles, femurs and humeri. For example LEFT proximal humerus with SUV max equal 11.0. Central sacrum with SUV max equal 8.8. Nearly every spinal vertebral body is  involved. Example lesion at L4 with SUV max 8.6. Incidental CT findings: This diffuse intense metabolic activity new throughout the skeleton is accompanied by extensive sclerotic and lytic change on the CT portion exam. IMPRESSION: 1. Widespread skeletal metastasis involving the axillary and appendicular skeleton with hypermetabolic lesions associated with lytic and sclerotic change on the CT  portion exam. 2. Hypermetabolic mass in the medial aspect of the RIGHT breast consistent primary breast carcinoma. 3. No evidence of nodal metastasis in the thorax. 4. No evidence of visceral metastasis. Electronically Signed   By: Suzy Bouchard M.D.   On: 11/05/2020 12:34   DG Bone Survey Met  Result Date: 10/19/2020 CLINICAL DATA:  Lytic lesion of bone on x-ray. EXAM: METASTATIC BONE SURVEY COMPARISON:  None. FINDINGS: Several lytic lesions are seen throughout the calvarium consistent with recent CT findings. There appears to be a subtle lytic lesion in the lateral left humeral head. A lytic lesion is suspected in the left coracoid and left distal clavicle. No definitive lytic lesions in the right humerus. No definitive lytic lesions in the left radius or ulna. No definitive lytic lesions in the right radius or ulna. Known lytic lesions are seen in the cervical spine at multiple levels, consistent with recent CT findings. The findings are much more dramatic on CT imaging. Anterior wedging is seen of a lower thoracic vertebral body. While evaluation is limited, multiple lytic lesions are suspected in the thoracic spine. While subtle, I do suspect scattered lytic lesions in the lumbar spine. Multiple lytic lesions are seen in the ribs. This is extremely subtle on frontal views but apparent on the lumbar spine lateral views. Lytic lesions seen throughout the pelvic bones and proximal femurs. No definitive lytic lesions in the tibias or fibulas bilaterally. IMPRESSION: Widespread bony lytic lesions consistent with lytic  metastases versus multiple myeloma. Electronically Signed   By: Dorise Bullion III M.D   On: 10/19/2020 11:36   US BREAST LTD UNI RIGHT INC AXILLA  Result Date: 10/24/2020 CLINICAL DATA:  60 year old female with recent fall and subsequent imaging revealing widespread osseous metastatic disease throughout the cervical, thoracic and lumbar spine as well as brain metastases. The patient states she recently noted a palpable area of concern in her right breast and recent CT scan demonstrated a large 3.3 cm mass in the upper right breast. EXAM: DIGITAL DIAGNOSTIC BILATERAL MAMMOGRAM WITH TOMO AND CAD; ULTRASOUND RIGHT BREAST LIMITED COMPARISON:  No prior mammograms for comparison. ACR Breast Density Category b: There are scattered areas of fibroglandular density. FINDINGS: There is an irregular mass in the upper central right breast measuring 3.4 cm with associated calcifications extending from this mass towards the nipple. The mass and calcifications all together measures 6.8 cm. No suspicious masses or calcifications are seen in the left breast. Mammographic images were processed with CAD. Physical examination reveals a large firm mass in the upper right breast. Targeted ultrasound of the right breast was performed. There is an irregular hypoechoic mass containing calcifications in the right breast at 12 o'clock 6 cm from nipple measuring 4.8 x 2.6 x 3.1 cm. This corresponds well with the mass seen in the right breast at mammography. No definite lymphadenopathy seen in the right axilla. IMPRESSION: 1. Palpable mass in the right breast at 12 o'clock with associated calcifications all together measuring up to 6.8 cm. 2.  No lymphadenopathy identified in the right axilla. RECOMMENDATION: Recommend ultrasound-guided biopsy of the mass in the right breast at the 12 o'clock position. This is being scheduled for the patient. I have discussed the findings and recommendations with the patient. If applicable, a reminder  letter will be sent to the patient regarding the next appointment. BI-RADS CATEGORY  5: Highly suggestive of malignancy. Electronically Signed   By: Everlean Alstrom M.D.   On: 10/24/2020 10:40   MM DIAG BREAST  TOMO BILATERAL  Result Date: 10/24/2020 CLINICAL DATA:  60 year old female with recent fall and subsequent imaging revealing widespread osseous metastatic disease throughout the cervical, thoracic and lumbar spine as well as brain metastases. The patient states she recently noted a palpable area of concern in her right breast and recent CT scan demonstrated a large 3.3 cm mass in the upper right breast. EXAM: DIGITAL DIAGNOSTIC BILATERAL MAMMOGRAM WITH TOMO AND CAD; ULTRASOUND RIGHT BREAST LIMITED COMPARISON:  No prior mammograms for comparison. ACR Breast Density Category b: There are scattered areas of fibroglandular density. FINDINGS: There is an irregular mass in the upper central right breast measuring 3.4 cm with associated calcifications extending from this mass towards the nipple. The mass and calcifications all together measures 6.8 cm. No suspicious masses or calcifications are seen in the left breast. Mammographic images were processed with CAD. Physical examination reveals a large firm mass in the upper right breast. Targeted ultrasound of the right breast was performed. There is an irregular hypoechoic mass containing calcifications in the right breast at 12 o'clock 6 cm from nipple measuring 4.8 x 2.6 x 3.1 cm. This corresponds well with the mass seen in the right breast at mammography. No definite lymphadenopathy seen in the right axilla. IMPRESSION: 1. Palpable mass in the right breast at 12 o'clock with associated calcifications all together measuring up to 6.8 cm. 2.  No lymphadenopathy identified in the right axilla. RECOMMENDATION: Recommend ultrasound-guided biopsy of the mass in the right breast at the 12 o'clock position. This is being scheduled for the patient. I have discussed the  findings and recommendations with the patient. If applicable, a reminder letter will be sent to the patient regarding the next appointment. BI-RADS CATEGORY  5: Highly suggestive of malignancy. Electronically Signed   By: Everlean Alstrom M.D.   On: 10/24/2020 10:40   MM CLIP PLACEMENT RIGHT  Result Date: 10/25/2020 CLINICAL DATA:  60 year old female status post ultrasound-guided biopsy of the right breast. EXAM: DIAGNOSTIC RIGHT MAMMOGRAM POST ULTRASOUND BIOPSY COMPARISON:  Previous exam(s). FINDINGS: Mammographic images were obtained following ultrasound guided biopsy of the right breast. The biopsy marking clip is in expected position at the site of biopsy. IMPRESSION: Appropriate positioning of the ribbon shaped biopsy marking clip at the site of biopsy in the superior right breast. Final Assessment: Post Procedure Mammograms for Marker Placement Electronically Signed   By: Kristopher Oppenheim M.D.   On: 10/25/2020 12:32   DG Hip Unilat With Pelvis 2-3 Views Left  Result Date: 10/18/2020 CLINICAL DATA:  [place] down steps this morning.  Bilateral hip pain. EXAM: DG HIP (WITH OR WITHOUT PELVIS) 2-3V LEFT COMPARISON:  None. FINDINGS: No fracture. Subtle lucencies are noted on the AP pelvis view, which are not well-defined and some of which are overlying bowel gas. No definite osteolytic lesions and no evidence of an osteoblastic lesion. Right SI joint is narrowed with bordering sclerosis. The left SI joint, both hip joints and the pubic symphysis are normally spaced and aligned with no significant arthropathic change. Soft tissues are unremarkable. IMPRESSION: 1. No fracture or acute finding. 2. Right SI joint degenerative/arthropathic change. Electronically Signed   By: Lajean Manes M.D.   On: 10/18/2020 08:46   DG Hip Unilat  With Pelvis 2-3 Views Right  Result Date: 10/18/2020 CLINICAL DATA:  [place] down steps this morning.  Bilateral hip pain. EXAM: DG HIP (WITH OR WITHOUT PELVIS) 2-3V RIGHT COMPARISON:   None. FINDINGS: No fracture. Patchy sclerosis is noted along the right  SI joint. SI joint is normally aligned but mildly narrowed. Right hip joint is normally spaced and aligned. SI joint is normally aligned. Soft tissues are unremarkable. IMPRESSION: 1. No fracture or dislocation. 2. Right SI joint arthropathic changes. Well maintained right hip joint. Electronically Signed   By: Lajean Manes M.D.   On: 10/18/2020 08:43   [place] RT BREAST BX W LOC DEV 1ST LESION IMG BX SPEC US GUIDE  Addendum Date: 11/01/2020   ADDENDUM REPORT: 10/31/2020 10:42 ADDENDUM: Pathology revealed GRADE II INVASIVE DUCTAL CARCINOMA. DUCTAL CARCINOMA IN SITU of the RIGHT breast, 12 o'clock, 6 cmfn. This was found to be concordant by Dr. Kristopher Oppenheim. Pathology results were discussed with the patient and her daughter by telephone. The patient reported doing well after the biopsy with tenderness and minimal bleeding at the site. Post biopsy instructions and care were reviewed and questions were answered. The patient was encouraged to call [facility] for any additional concerns. Results sent via Epic messaging to Charlsie Merles RN with Dr. Burney Gauze of [facility]. The patient is scheduled to see Dr. Marin Olp November 01, 2020 and Dr. Gery Pray on November 06, 2020 to continue treatment plan for Metastatic cancer to spine. Pathology results reported by Stacie Acres RN on 10/31/2020. Electronically Signed   By: Kristopher Oppenheim M.D.   On: 10/31/2020 10:42   Result Date: 11/01/2020 CLINICAL DATA:  60 year old female with a suspicious right breast mass. EXAM: ULTRASOUND GUIDED RIGHT BREAST CORE NEEDLE BIOPSY COMPARISON:  Previous exam(s). PROCEDURE: I met with the patient and we discussed the procedure of ultrasound-guided biopsy, including benefits and alternatives. We discussed the high likelihood of a successful procedure. We discussed the risks of the procedure, including infection,  bleeding, tissue injury, clip migration, and inadequate sampling. Informed written consent was given. The usual time-out protocol was performed immediately prior to the procedure. Lesion quadrant: Upper outer quadrant Using sterile technique and 1% Lidocaine as local anesthetic, under direct ultrasound visualization, a 14 gauge spring-loaded device was used to perform biopsy of a large irregular mass at the 12 o'clock position using a lateral approach. The patient had significant bleeding after a single core was obtained. Therefore, additional cores were not performed. Firm pressure was held for approximately 10 minutes in a quick clot and pressure dressing applied. At the conclusion of the procedure a ribbon shaped tissue marker clip was deployed into the biopsy cavity. Follow up 2 view mammogram was performed and dictated separately. IMPRESSION: Ultrasound guided biopsy of the right breast, complicated by significant bleeding after a single core was obtained. Firm pressure was held and a quick clot in pressure dressing applied. Bleeding eventually subsided and the patient was discharged in stable condition. Electronically Signed: By: Kristopher Oppenheim M.D. On: 10/25/2020 12:35   VAS [place] LOWER EXTREMITY VENOUS (DVT)  Result Date: 10/21/2020  Lower Venous DVT Study Indications: Swelling, and Edema.  Comparison Study: no prior Performing Technologist: Abram Sander RVS  Examination Guidelines: A complete evaluation includes B-mode imaging, spectral Doppler, color Doppler, and power Doppler as needed of all accessible portions of each vessel. Bilateral testing is considered an integral part of a complete examination. Limited examinations for reoccurring indications may be performed as noted. The reflux portion of the exam is performed with the patient in reverse Trendelenburg.  +---------+---------------+---------+-----------+----------+--------------+ RIGHT     CompressibilityPhasicitySpontaneityPropertiesThrombus Aging +---------+---------------+---------+-----------+----------+--------------+ CFV      Full  Yes      Yes                                 +---------+---------------+---------+-----------+----------+--------------+ SFJ      Full                                                        +---------+---------------+---------+-----------+----------+--------------+ FV Prox  Full                                                        +---------+---------------+---------+-----------+----------+--------------+ FV Mid   Full                                                        +---------+---------------+---------+-----------+----------+--------------+ FV DistalFull                                                        +---------+---------------+---------+-----------+----------+--------------+ PFV      Full                                                        +---------+---------------+---------+-----------+----------+--------------+ POP      Full           Yes      Yes                                 +---------+---------------+---------+-----------+----------+--------------+ PTV      Full                                                        +---------+---------------+---------+-----------+----------+--------------+ PERO     Full                                                        +---------+---------------+---------+-----------+----------+--------------+   +----+---------------+---------+-----------+----------+--------------+ LEFTCompressibilityPhasicitySpontaneityPropertiesThrombus Aging +----+---------------+---------+-----------+----------+--------------+ CFV Full           Yes      Yes                                 +----+---------------+---------+-----------+----------+--------------+     Summary: RIGHT: - There is no evidence of deep vein thrombosis  in the lower  extremity.  - No cystic structure found in the popliteal fossa.  LEFT: - No evidence of common femoral vein obstruction.  *See table(s) above for measurements and observations. Electronically signed by Jamelle Haring on 10/21/2020 at 7:26:07 PM.    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  Her most significant pain is in the left pelvis region and on imaging most significant area of involvement peers to be in the left iliac crest is likely explaining much of her pain in this region.  She does report pain in the left upper leg when standing but no obvious instability on imaging noted.  The patient will be a good candidate for palliative treatments directed to the left pelvis and left femur region.  Today, I talked to the patient and daughter about the findings and work-up thus far.  We discussed the natural history of breast cancer and general treatment, highlighting the role of radiotherapy in the management.  We discussed the available radiation techniques, and focused on the details of logistics and delivery.  We reviewed the anticipated acute and late sequelae associated with radiation in this setting.  The patient was encouraged to ask questions that I answered to the best of my ability.  A patient consent form was discussed and signed.  We retained a copy for our records.  The patient would like to proceed with radiation and will be scheduled for CT simulation.  PLAN: The patient will return tomorrow for CT simulation.  Treatments to begin early next week.  Anticipate 10-14 treatments.  Total time spent in this encounter was 60 minutes which included reviewing the patient's most recent ED visit, hospitalization, CT scans, MRIs, PET scan, biopsy, pathology report, consultations, follow-ups, physical  examination, and documentation.    ------------------------------------------------  Blair Promise, PhD, MD  This document serves as a record of services personally performed by Gery Pray, MD. It was created on his behalf by Clerance Lav, a trained medical scribe. The creation of this record is based on the scribe's personal observations and the provider's statements to them. This document has been checked and approved by the attending provider.

## 2020-11-07 ENCOUNTER — Other Ambulatory Visit: Payer: Self-pay

## 2020-11-07 ENCOUNTER — Other Ambulatory Visit: Payer: Self-pay | Admitting: Hematology & Oncology

## 2020-11-07 ENCOUNTER — Ambulatory Visit
Admission: RE | Admit: 2020-11-07 | Discharge: 2020-11-07 | Disposition: A | Discharge status: Home / Self Care | Payer: BC Managed Care – PPO | Source: Ambulatory Visit | Attending: Radiation Oncology | Admitting: Radiation Oncology

## 2020-11-07 DIAGNOSIS — C50919 Malignant neoplasm of unspecified site of unspecified female breast: Secondary | ICD-10-CM

## 2020-11-07 DIAGNOSIS — Z170 Estrogen receptor positive status [ER+]: Secondary | ICD-10-CM | POA: Diagnosis not present

## 2020-11-07 DIAGNOSIS — C7951 Secondary malignant neoplasm of bone: Secondary | ICD-10-CM

## 2020-11-07 DIAGNOSIS — C50411 Malignant neoplasm of upper-outer quadrant of right female breast: Secondary | ICD-10-CM | POA: Diagnosis not present

## 2020-11-07 NOTE — Telephone Encounter (Signed)
See patient message from 11/01/20

## 2020-11-08 MED FILL — oxyCODONE HCL 10 MG TABS: 10 | 7 days supply | Qty: 30 | Fill #0

## 2020-11-08 NOTE — Telephone Encounter (Signed)
Per Annette James at [place] is scheduled to be delivered on 11/12/20.

## 2020-11-11 ENCOUNTER — Other Ambulatory Visit: Payer: Self-pay

## 2020-11-11 ENCOUNTER — Telehealth: Payer: Self-pay

## 2020-11-11 DIAGNOSIS — C7951 Secondary malignant neoplasm of bone: Secondary | ICD-10-CM

## 2020-11-11 DIAGNOSIS — C50919 Malignant neoplasm of unspecified site of unspecified female breast: Secondary | ICD-10-CM

## 2020-11-11 MED ORDER — METAXALONE 400 MG PO TABS: 400.0000 mg | ORAL_TABLET | Freq: Three times a day (TID) | ORAL | 0 refills | Status: DC

## 2020-11-11 NOTE — Telephone Encounter (Signed)
-----   Message from [facility], [place] sent at 11/11/2020 10:50 AM EST ----- Regarding: walker So if the patient wants to pay out of pocket then no changes to the note in January is needed.   If patient wants insurance to pay for it than there will need to be some changes. Can you find out what patient wants to do and then we can go from there. I have the wording needed for insurance to cover.

## 2020-11-11 NOTE — Telephone Encounter (Signed)
Please see telephone note dated 11/12/19 in regard.

## 2020-11-11 NOTE — Telephone Encounter (Signed)
I have pended the rx for the 4 wheeled walker.   However, the notes do not support unless the patient wants to pay out of pocket.

## 2020-11-11 NOTE — Telephone Encounter (Signed)
I have spoke to the pt. She stated that a friend has let her have a walker that she had. I informed the patient if she would like for [place] to proceed with getting her own at a later date to give [place] a call and let us know.

## 2020-11-12 ENCOUNTER — Other Ambulatory Visit: Payer: Self-pay

## 2020-11-12 ENCOUNTER — Ambulatory Visit
Admission: RE | Admit: 2020-11-12 | Discharge: 2020-11-12 | Disposition: A | Discharge status: Home / Self Care | Payer: BC Managed Care – PPO | Source: Ambulatory Visit | Attending: Radiation Oncology | Admitting: Radiation Oncology

## 2020-11-12 DIAGNOSIS — Z170 Estrogen receptor positive status [ER+]: Secondary | ICD-10-CM | POA: Diagnosis not present

## 2020-11-12 DIAGNOSIS — C7951 Secondary malignant neoplasm of bone: Secondary | ICD-10-CM | POA: Diagnosis not present

## 2020-11-12 DIAGNOSIS — C50411 Malignant neoplasm of upper-outer quadrant of right female breast: Secondary | ICD-10-CM | POA: Diagnosis not present

## 2020-11-13 ENCOUNTER — Other Ambulatory Visit: Payer: Self-pay

## 2020-11-13 ENCOUNTER — Ambulatory Visit
Admission: RE | Admit: 2020-11-13 | Discharge: 2020-11-13 | Disposition: A | Discharge status: Home / Self Care | Payer: BC Managed Care – PPO | Source: Ambulatory Visit | Attending: Radiation Oncology | Admitting: Radiation Oncology

## 2020-11-13 DIAGNOSIS — C7951 Secondary malignant neoplasm of bone: Secondary | ICD-10-CM | POA: Diagnosis not present

## 2020-11-13 DIAGNOSIS — C50411 Malignant neoplasm of upper-outer quadrant of right female breast: Secondary | ICD-10-CM | POA: Diagnosis not present

## 2020-11-13 DIAGNOSIS — Z170 Estrogen receptor positive status [ER+]: Secondary | ICD-10-CM | POA: Diagnosis not present

## 2020-11-14 ENCOUNTER — Ambulatory Visit
Admission: RE | Admit: 2020-11-14 | Discharge: 2020-11-14 | Disposition: A | Discharge status: Home / Self Care | Payer: BC Managed Care – PPO | Source: Ambulatory Visit | Attending: Radiation Oncology | Admitting: Radiation Oncology

## 2020-11-14 ENCOUNTER — Other Ambulatory Visit: Payer: Self-pay | Admitting: *Deleted

## 2020-11-14 ENCOUNTER — Encounter: Payer: Self-pay | Admitting: *Deleted

## 2020-11-14 ENCOUNTER — Other Ambulatory Visit: Payer: Self-pay

## 2020-11-14 DIAGNOSIS — C7951 Secondary malignant neoplasm of bone: Secondary | ICD-10-CM | POA: Diagnosis not present

## 2020-11-14 DIAGNOSIS — C50411 Malignant neoplasm of upper-outer quadrant of right female breast: Secondary | ICD-10-CM | POA: Diagnosis not present

## 2020-11-14 DIAGNOSIS — Z170 Estrogen receptor positive status [ER+]: Secondary | ICD-10-CM | POA: Diagnosis not present

## 2020-11-14 MED ORDER — ONDANSETRON HCL 8 MG PO TABS: 8.0000 mg | ORAL_TABLET | Freq: Three times a day (TID) | ORAL | 3 refills | Status: DC | PRN

## 2020-11-14 NOTE — Progress Notes (Signed)
Patient is having some nausea with her Ibrance. The family has zofran at home and wants to make sure it's okay for her to take. Confirmed that patient can take zofran for nausea. Prescription sent for the patient.   Oncology Nurse Navigator Documentation  Oncology Nurse Navigator Flowsheets 11/14/2020  Abnormal Finding Date -  Confirmed Diagnosis Date -  Diagnosis Status -  Planned Course of Treatment -  Phase of Treatment -  Chemotherapy Pending- Reason: -  Chemotherapy Actual Start Date: -  Navigator Follow Up Date: 11/20/2020  Navigator Follow Up Reason: Follow-up Appointment  Navigator Location [facility]  Navigator Encounter Type Telephone  Telephone Symptom Mgt;Incoming Call  Treatment Initiated Date -  Patient Visit Type MedOnc  Treatment Phase Active Tx  Barriers/Navigation Needs Coordination of Care;Education;Morbidities/Frailty  Education Pain/ Symptom Management  Interventions Education;Medication Assistance  Acuity Level 2-Minimal Needs (1-2 Barriers Identified)  Referrals -  Coordination of Care -  Education Method Verbal  Support Groups/Services Friends and Family  Time Spent with Patient 30

## 2020-11-15 ENCOUNTER — Other Ambulatory Visit: Payer: Self-pay

## 2020-11-15 ENCOUNTER — Ambulatory Visit
Admission: RE | Admit: 2020-11-15 | Discharge: 2020-11-15 | Disposition: A | Discharge status: Home / Self Care | Payer: BC Managed Care – PPO | Source: Ambulatory Visit | Attending: Radiation Oncology | Admitting: Radiation Oncology

## 2020-11-15 DIAGNOSIS — C50919 Malignant neoplasm of unspecified site of unspecified female breast: Secondary | ICD-10-CM | POA: Diagnosis not present

## 2020-11-15 DIAGNOSIS — Z170 Estrogen receptor positive status [ER+]: Secondary | ICD-10-CM | POA: Diagnosis not present

## 2020-11-15 DIAGNOSIS — C50411 Malignant neoplasm of upper-outer quadrant of right female breast: Secondary | ICD-10-CM | POA: Diagnosis not present

## 2020-11-15 DIAGNOSIS — C7951 Secondary malignant neoplasm of bone: Secondary | ICD-10-CM | POA: Diagnosis not present

## 2020-11-18 ENCOUNTER — Ambulatory Visit
Admission: RE | Admit: 2020-11-18 | Discharge: 2020-11-18 | Disposition: A | Discharge status: Home / Self Care | Payer: BC Managed Care – PPO | Source: Ambulatory Visit | Attending: Radiation Oncology | Admitting: Radiation Oncology

## 2020-11-18 ENCOUNTER — Other Ambulatory Visit: Payer: Self-pay

## 2020-11-18 DIAGNOSIS — Z170 Estrogen receptor positive status [ER+]: Secondary | ICD-10-CM | POA: Diagnosis not present

## 2020-11-18 DIAGNOSIS — C7951 Secondary malignant neoplasm of bone: Secondary | ICD-10-CM | POA: Diagnosis not present

## 2020-11-18 DIAGNOSIS — C50411 Malignant neoplasm of upper-outer quadrant of right female breast: Secondary | ICD-10-CM | POA: Diagnosis not present

## 2020-11-19 ENCOUNTER — Other Ambulatory Visit: Payer: Self-pay

## 2020-11-19 ENCOUNTER — Encounter: Payer: Self-pay | Admitting: *Deleted

## 2020-11-19 ENCOUNTER — Ambulatory Visit
Admission: RE | Admit: 2020-11-19 | Discharge: 2020-11-19 | Disposition: A | Discharge status: Home / Self Care | Payer: BC Managed Care – PPO | Source: Ambulatory Visit | Attending: Radiation Oncology | Admitting: Radiation Oncology

## 2020-11-19 ENCOUNTER — Encounter: Payer: Self-pay | Admitting: Hematology & Oncology

## 2020-11-19 ENCOUNTER — Other Ambulatory Visit: Payer: Self-pay | Admitting: *Deleted

## 2020-11-19 DIAGNOSIS — Z170 Estrogen receptor positive status [ER+]: Secondary | ICD-10-CM | POA: Diagnosis not present

## 2020-11-19 DIAGNOSIS — C7951 Secondary malignant neoplasm of bone: Secondary | ICD-10-CM

## 2020-11-19 DIAGNOSIS — C50919 Malignant neoplasm of unspecified site of unspecified female breast: Secondary | ICD-10-CM

## 2020-11-19 DIAGNOSIS — C50411 Malignant neoplasm of upper-outer quadrant of right female breast: Secondary | ICD-10-CM | POA: Diagnosis not present

## 2020-11-19 MED ORDER — DRONABINOL 5 MG PO CAPS: 5.0000 mg | ORAL_CAPSULE | Freq: Two times a day (BID) | ORAL | 0 refills | Status: DC

## 2020-11-19 NOTE — Progress Notes (Signed)
Received a MyChart message regarding decreased appetite and weight loss. She's been nauseous which is moderately controlled on zofran. Responded to her message and will place nutrition referral.  Reviewed symptoms with Dr Marin Olp. He will place patient on Marinol for appetite support.  Called patient and notified her of new prescription. Also reviewed with her the referral for nutrition.  Per her message the patient has only been taking the zofran when the nausea is "too much". Educated her on taking antiemetics early, and even on schedule to prevent any nausea.   Oncology Nurse Navigator Documentation  Oncology Nurse Navigator Flowsheets 11/19/2020  Abnormal Finding Date -  Confirmed Diagnosis Date -  Diagnosis Status -  Planned Course of Treatment -  Phase of Treatment -  Chemotherapy Pending- Reason: -  Chemotherapy Actual Start Date: -  Navigator Follow Up Date: 11/20/2020  Navigator Follow Up Reason: Chemotherapy  Navigator Location [facility]  Navigator Encounter Type MyChart;Telephone  Telephone Symptom Mgt;Outgoing Call  Treatment Initiated Date -  Patient Visit Type MedOnc  Treatment Phase Active Tx  Barriers/Navigation Needs Coordination of Care;Education;Morbidities/Frailty  Education Pain/ Symptom Management  Interventions Education;Medication Assistance;Psycho-Social Support;Referrals  Acuity Level 2-Minimal Needs (1-2 Barriers Identified)  Referrals Nutrition/dietician  Coordination of Care -  Education Method Verbal  Support Groups/Services Friends and Family  Time Spent with Patient 36

## 2020-11-20 ENCOUNTER — Ambulatory Visit
Admission: RE | Admit: 2020-11-20 | Discharge: 2020-11-20 | Disposition: A | Discharge status: Home / Self Care | Payer: BC Managed Care – PPO | Source: Ambulatory Visit | Attending: Radiation Oncology | Admitting: Radiation Oncology

## 2020-11-20 ENCOUNTER — Encounter: Payer: Self-pay | Admitting: *Deleted

## 2020-11-20 ENCOUNTER — Other Ambulatory Visit: Payer: Self-pay

## 2020-11-20 ENCOUNTER — Inpatient Hospital Stay: Payer: BC Managed Care – PPO

## 2020-11-20 ENCOUNTER — Inpatient Hospital Stay: Payer: BC Managed Care – PPO | Attending: Hematology & Oncology

## 2020-11-20 VITALS — BP 144/93 | HR 84 | Temp 98.2°F | Resp 20

## 2020-11-20 DIAGNOSIS — Z5111 Encounter for antineoplastic chemotherapy: Secondary | ICD-10-CM | POA: Insufficient documentation

## 2020-11-20 DIAGNOSIS — C50411 Malignant neoplasm of upper-outer quadrant of right female breast: Secondary | ICD-10-CM | POA: Diagnosis not present

## 2020-11-20 DIAGNOSIS — R112 Nausea with vomiting, unspecified: Secondary | ICD-10-CM | POA: Insufficient documentation

## 2020-11-20 DIAGNOSIS — C7951 Secondary malignant neoplasm of bone: Secondary | ICD-10-CM | POA: Insufficient documentation

## 2020-11-20 DIAGNOSIS — C50911 Malignant neoplasm of unspecified site of right female breast: Secondary | ICD-10-CM | POA: Diagnosis not present

## 2020-11-20 DIAGNOSIS — C50919 Malignant neoplasm of unspecified site of unspecified female breast: Secondary | ICD-10-CM

## 2020-11-20 DIAGNOSIS — Z170 Estrogen receptor positive status [ER+]: Secondary | ICD-10-CM | POA: Diagnosis not present

## 2020-11-20 LAB — COMPREHENSIVE METABOLIC PANEL
ALT: 12 U/L (ref 0–44)
AST: 24 U/L (ref 15–41)
Albumin: 4.2 g/dL (ref 3.5–5.0)
Alkaline Phosphatase: 324 U/L — ABNORMAL HIGH (ref 38–126)
Anion gap: 8 (ref 5–15)
BUN: 17 mg/dL (ref 6–20)
CO2: 30 mmol/L (ref 22–32)
Calcium: 10.3 mg/dL (ref 8.9–10.3)
Chloride: 101 mmol/L (ref 98–111)
Creatinine, Ser: 0.83 mg/dL (ref 0.44–1.00)
GFR, Estimated: >60 mL/min (ref >60)
Glucose, Bld: 151 mg/dL — ABNORMAL HIGH (ref 70–99)
Potassium: 4.2 mmol/L (ref 3.5–5.1)
Sodium: 139 mmol/L (ref 135–145)
Total Bilirubin: 0.4 mg/dL (ref 0.3–1.2)
Total Protein: 7 g/dL (ref 6.5–8.1)

## 2020-11-20 LAB — CBC WITH DIFFERENTIAL (CANCER CENTER ONLY)
Abs Immature Granulocytes: 0.02 10*3/uL (ref 0.00–0.07)
Basophils Absolute: 0.1 10*3/uL (ref 0.0–0.1)
Basophils Relative: 1 %
Eosinophils Absolute: 0.1 10*3/uL (ref 0.0–0.5)
Eosinophils Relative: 4 %
HCT: 37.1 % (ref 36.0–46.0)
Hemoglobin: 11.8 g/dL — ABNORMAL LOW (ref 12.0–15.0)
Immature Granulocytes: 1 %
Lymphocytes Relative: 17 %
Lymphs Abs: 0.7 10*3/uL (ref 0.7–4.0)
MCH: 25.7 pg — ABNORMAL LOW (ref 26.0–34.0)
MCHC: 31.8 g/dL (ref 30.0–36.0)
MCV: 80.7 fL (ref 80.0–100.0)
Monocytes Absolute: 0.1 10*3/uL (ref 0.1–1.0)
Monocytes Relative: 3 %
Neutro Abs: 3 10*3/uL (ref 1.7–7.7)
Neutrophils Relative %: 74 %
Platelet Count: 268 10*3/uL (ref 150–400)
RBC: 4.6 MIL/uL (ref 3.87–5.11)
RDW: 21.9 % — ABNORMAL HIGH (ref 11.5–15.5)
WBC Count: 4 10*3/uL (ref 4.0–10.5)
nRBC: 0 % (ref 0.0–0.2)

## 2020-11-20 MED ORDER — FULVESTRANT 250 MG/5ML IM SOLN
INTRAMUSCULAR | Status: AC
  Filled 2020-11-20: qty 5

## 2020-11-20 MED ORDER — FULVESTRANT 250 MG/5ML IM SOLN
500.0000 mg | Freq: Once | INTRAMUSCULAR | Status: AC
  Administered 2020-11-20: 500 mg via INTRAMUSCULAR

## 2020-11-20 NOTE — Patient Instructions (Signed)
Fulvestrant injection What is this medicine? FULVESTRANT (ful VES trant) blocks the effects of estrogen. It is used to treat breast cancer. This medicine may be used for other purposes; ask your health care provider or pharmacist if you have questions. COMMON BRAND NAME(S): FASLODEX What should I tell my health care provider before I take this medicine? They need to know if you have any of these conditions:  bleeding disorders  liver disease  low blood counts, like low white cell, platelet, or red cell counts  an unusual or allergic reaction to fulvestrant, other medicines, foods, dyes, or preservatives  pregnant or trying to get pregnant  breast-feeding How should I use this medicine? This medicine is for injection into a muscle. It is usually given by a health care professional in a hospital or clinic setting. Talk to your pediatrician regarding the use of this medicine in children. Special care may be needed. Overdosage: If you think you have taken too much of this medicine contact a poison control center or emergency room at once. NOTE: This medicine is only for you. Do not share this medicine with others. What if I miss a dose? It is important not to miss your dose. Call your doctor or health care professional if you are unable to keep an appointment. What may interact with this medicine?  medicines that treat or prevent blood clots like warfarin, enoxaparin, dalteparin, apixaban, dabigatran, and rivaroxaban This list may not describe all possible interactions. Give your health care provider a list of all the medicines, herbs, non-prescription drugs, or dietary supplements you use. Also tell them if you smoke, drink alcohol, or use illegal drugs. Some items may interact with your medicine. What should I watch for while using this medicine? Your condition will be monitored carefully while you are receiving this medicine. You will need important blood work done while you are taking  this medicine. Do not become pregnant while taking this medicine or for at least 1 year after stopping it. Women of child-bearing potential will need to have a negative pregnancy test before starting this medicine. Women should inform their doctor if they wish to become pregnant or think they might be pregnant. There is a potential for serious side effects to an unborn child. Men should inform their doctors if they wish to father a child. This medicine may lower sperm counts. Talk to your health care professional or pharmacist for more information. Do not breast-feed an infant while taking this medicine or for 1 year after the last dose. What side effects may I notice from receiving this medicine? Side effects that you should report to your doctor or health care professional as soon as possible:  allergic reactions like skin rash, itching or hives, swelling of the face, lips, or tongue  feeling faint or lightheaded, falls  pain, tingling, numbness, or weakness in the legs  signs and symptoms of infection like fever or chills; cough; flu-like symptoms; sore throat  vaginal bleeding Side effects that usually do not require medical attention (report to your doctor or health care professional if they continue or are bothersome):  aches, pains  constipation  diarrhea  headache  hot flashes  nausea, vomiting  pain at site where injected  stomach pain This list may not describe all possible side effects. Call your doctor for medical advice about side effects. You may report side effects to FDA at 1-800-FDA-1088. Where should I keep my medicine? This drug is given in a hospital or clinic and will   not be stored at home. NOTE: This sheet is a summary. It may not cover all possible information. If you have questions about this medicine, talk to your doctor, pharmacist, or health care provider.  2021 Elsevier/Gold Standard (2018-01-06 11:34:41)  

## 2020-11-20 NOTE — Progress Notes (Signed)
Patient given samples of Ensure and Boost.  Oncology Nurse Navigator Documentation  Oncology Nurse Navigator Flowsheets 11/20/2020  Abnormal Finding Date -  Confirmed Diagnosis Date -  Diagnosis Status -  Planned Course of Treatment -  Phase of Treatment -  Chemotherapy Pending- Reason: -  Chemotherapy Actual Start Date: -  Navigator Follow Up Date: 12/04/2020  Navigator Follow Up Reason: Follow-up Appointment;Chemotherapy  Navigator Restaurant manager, [employer] food Encounter Type Treatment  Telephone -  Treatment Initiated Date -  Patient Visit Type MedOnc  Treatment Phase Active Tx  Barriers/Navigation Needs Coordination of Care;Education;Morbidities/Frailty  Education -  Interventions Psycho-Social Support;Other  Acuity Level 2-Minimal Needs (1-2 Barriers Identified)  Referrals -  Coordination of Care -  Education Method -  Support Groups/Services Friends and Family  Time Spent with Patient 50

## 2020-11-21 ENCOUNTER — Ambulatory Visit
Admission: RE | Admit: 2020-11-21 | Discharge: 2020-11-21 | Disposition: A | Discharge status: Home / Self Care | Payer: BC Managed Care – PPO | Source: Ambulatory Visit | Attending: Radiation Oncology | Admitting: Radiation Oncology

## 2020-11-21 DIAGNOSIS — Z170 Estrogen receptor positive status [ER+]: Secondary | ICD-10-CM | POA: Diagnosis not present

## 2020-11-21 DIAGNOSIS — C7951 Secondary malignant neoplasm of bone: Secondary | ICD-10-CM | POA: Diagnosis not present

## 2020-11-21 DIAGNOSIS — C50411 Malignant neoplasm of upper-outer quadrant of right female breast: Secondary | ICD-10-CM | POA: Diagnosis not present

## 2020-11-22 ENCOUNTER — Ambulatory Visit
Admission: RE | Admit: 2020-11-22 | Discharge: 2020-11-22 | Disposition: A | Discharge status: Home / Self Care | Payer: BC Managed Care – PPO | Source: Ambulatory Visit | Attending: Radiation Oncology | Admitting: Radiation Oncology

## 2020-11-22 ENCOUNTER — Other Ambulatory Visit: Payer: Self-pay

## 2020-11-22 DIAGNOSIS — Z170 Estrogen receptor positive status [ER+]: Secondary | ICD-10-CM | POA: Diagnosis not present

## 2020-11-22 DIAGNOSIS — C7951 Secondary malignant neoplasm of bone: Secondary | ICD-10-CM | POA: Diagnosis not present

## 2020-11-22 DIAGNOSIS — C50411 Malignant neoplasm of upper-outer quadrant of right female breast: Secondary | ICD-10-CM | POA: Diagnosis not present

## 2020-11-25 ENCOUNTER — Inpatient Hospital Stay: Payer: BC Managed Care – PPO | Admitting: Nutrition

## 2020-11-25 ENCOUNTER — Telehealth: Payer: Self-pay | Admitting: Nutrition

## 2020-11-25 ENCOUNTER — Ambulatory Visit
Admission: RE | Admit: 2020-11-25 | Discharge: 2020-11-25 | Disposition: A | Discharge status: Home / Self Care | Payer: BC Managed Care – PPO | Source: Ambulatory Visit | Attending: Radiation Oncology | Admitting: Radiation Oncology

## 2020-11-25 DIAGNOSIS — Z170 Estrogen receptor positive status [ER+]: Secondary | ICD-10-CM | POA: Diagnosis not present

## 2020-11-25 DIAGNOSIS — C7951 Secondary malignant neoplasm of bone: Secondary | ICD-10-CM | POA: Diagnosis not present

## 2020-11-25 DIAGNOSIS — C50411 Malignant neoplasm of upper-outer quadrant of right female breast: Secondary | ICD-10-CM | POA: Diagnosis not present

## 2020-11-25 NOTE — Telephone Encounter (Signed)
Contacted patient by telephone.  60 year old female diagnosed with metastatic breast cancer followed by Dr. Marin Olp.  Past medical history not significant.  Medications include Marinol, Zofran, vitamin C, Ativan, Ibrance, vitamin B1.  Labs were reviewed.  Height: 68 inches. Weight: 227.25 pounds on January 26. Usual body weight: 240 pounds January 7. BMI: 34.55.  Patient reports her appetite is poor and she is having difficulty eating. She endorses weight loss.  She has lost approximately 5% of her body weight in less than 2 weeks which is significant. She denies diarrhea and constipation.  Nutrition diagnosis: Unintended weight loss related to metastatic breast cancer and associated treatments as evidenced by 5% weight loss over 2 weeks.  Intervention: Patient educated to consume smaller more frequent meals with adequate calories and protein to minimize further weight loss. Educated on strategies for improving appetite. Reviewed high-protein foods.  Offered suggestions of foods that may be better tolerated. Questions were answered.  Teach back method used. Emailed nutrition fact sheets to patient.  Patient has my contact information as well.  Monitoring, evaluation, goals: Patient will tolerate increased calories and protein to minimize further weight loss.  Next visit: Patient will contact RD with questions or concerns.  **Disclaimer: This note was dictated with voice recognition software. Similar sounding words can inadvertently be transcribed and this note may contain transcription errors which may not have been corrected upon publication of note.**

## 2020-11-26 ENCOUNTER — Ambulatory Visit
Admission: RE | Admit: 2020-11-26 | Discharge: 2020-11-26 | Disposition: A | Discharge status: Home / Self Care | Payer: BC Managed Care – PPO | Source: Ambulatory Visit | Attending: Radiation Oncology | Admitting: Radiation Oncology

## 2020-11-26 ENCOUNTER — Other Ambulatory Visit: Payer: Self-pay

## 2020-11-26 DIAGNOSIS — C7951 Secondary malignant neoplasm of bone: Secondary | ICD-10-CM | POA: Diagnosis not present

## 2020-11-26 DIAGNOSIS — C50411 Malignant neoplasm of upper-outer quadrant of right female breast: Secondary | ICD-10-CM | POA: Diagnosis not present

## 2020-11-26 DIAGNOSIS — Z170 Estrogen receptor positive status [ER+]: Secondary | ICD-10-CM | POA: Diagnosis not present

## 2020-11-27 ENCOUNTER — Ambulatory Visit
Admission: RE | Admit: 2020-11-27 | Discharge: 2020-11-27 | Disposition: A | Discharge status: Home / Self Care | Payer: BC Managed Care – PPO | Source: Ambulatory Visit | Attending: Radiation Oncology | Admitting: Radiation Oncology

## 2020-11-27 ENCOUNTER — Other Ambulatory Visit: Payer: Self-pay

## 2020-11-27 DIAGNOSIS — C7951 Secondary malignant neoplasm of bone: Secondary | ICD-10-CM | POA: Diagnosis not present

## 2020-11-27 DIAGNOSIS — Z170 Estrogen receptor positive status [ER+]: Secondary | ICD-10-CM | POA: Diagnosis not present

## 2020-11-27 DIAGNOSIS — C50411 Malignant neoplasm of upper-outer quadrant of right female breast: Secondary | ICD-10-CM | POA: Diagnosis not present

## 2020-11-28 ENCOUNTER — Ambulatory Visit
Admission: RE | Admit: 2020-11-28 | Discharge: 2020-11-28 | Disposition: A | Discharge status: Home / Self Care | Payer: BC Managed Care – PPO | Source: Ambulatory Visit | Attending: Radiation Oncology | Admitting: Radiation Oncology

## 2020-11-28 ENCOUNTER — Other Ambulatory Visit: Payer: Self-pay

## 2020-11-28 DIAGNOSIS — C50411 Malignant neoplasm of upper-outer quadrant of right female breast: Secondary | ICD-10-CM | POA: Diagnosis not present

## 2020-11-28 DIAGNOSIS — Z170 Estrogen receptor positive status [ER+]: Secondary | ICD-10-CM | POA: Diagnosis not present

## 2020-11-28 DIAGNOSIS — C7951 Secondary malignant neoplasm of bone: Secondary | ICD-10-CM | POA: Diagnosis not present

## 2020-11-29 ENCOUNTER — Other Ambulatory Visit: Payer: Self-pay

## 2020-11-29 ENCOUNTER — Ambulatory Visit
Admission: RE | Admit: 2020-11-29 | Discharge: 2020-11-29 | Disposition: A | Discharge status: Home / Self Care | Payer: BC Managed Care – PPO | Source: Ambulatory Visit | Attending: Radiation Oncology | Admitting: Radiation Oncology

## 2020-11-29 ENCOUNTER — Encounter: Payer: Self-pay | Admitting: Radiation Oncology

## 2020-11-29 DIAGNOSIS — C50411 Malignant neoplasm of upper-outer quadrant of right female breast: Secondary | ICD-10-CM | POA: Diagnosis not present

## 2020-11-29 DIAGNOSIS — C7951 Secondary malignant neoplasm of bone: Secondary | ICD-10-CM | POA: Diagnosis not present

## 2020-11-29 DIAGNOSIS — Z170 Estrogen receptor positive status [ER+]: Secondary | ICD-10-CM | POA: Diagnosis not present

## 2020-12-02 ENCOUNTER — Ambulatory Visit: Payer: BC Managed Care – PPO

## 2020-12-04 ENCOUNTER — Other Ambulatory Visit: Payer: Self-pay | Admitting: Hematology & Oncology

## 2020-12-04 ENCOUNTER — Inpatient Hospital Stay: Payer: BC Managed Care – PPO

## 2020-12-04 ENCOUNTER — Encounter: Payer: Self-pay | Admitting: Hematology & Oncology

## 2020-12-04 ENCOUNTER — Encounter: Payer: Self-pay | Admitting: *Deleted

## 2020-12-04 ENCOUNTER — Other Ambulatory Visit: Payer: Self-pay

## 2020-12-04 ENCOUNTER — Telehealth: Payer: Self-pay

## 2020-12-04 ENCOUNTER — Inpatient Hospital Stay: Payer: BC Managed Care – PPO | Admitting: Hematology & Oncology

## 2020-12-04 VITALS — BP 132/73 | HR 81 | Resp 17

## 2020-12-04 VITALS — BP 127/77 | HR 84 | Temp 98.4°F | Resp 18 | Wt 215.0 lb

## 2020-12-04 DIAGNOSIS — C7951 Secondary malignant neoplasm of bone: Secondary | ICD-10-CM

## 2020-12-04 DIAGNOSIS — Z5111 Encounter for antineoplastic chemotherapy: Secondary | ICD-10-CM | POA: Diagnosis not present

## 2020-12-04 DIAGNOSIS — C50919 Malignant neoplasm of unspecified site of unspecified female breast: Secondary | ICD-10-CM

## 2020-12-04 DIAGNOSIS — Z7189 Other specified counseling: Secondary | ICD-10-CM

## 2020-12-04 DIAGNOSIS — C50911 Malignant neoplasm of unspecified site of right female breast: Secondary | ICD-10-CM | POA: Diagnosis not present

## 2020-12-04 DIAGNOSIS — R112 Nausea with vomiting, unspecified: Secondary | ICD-10-CM | POA: Diagnosis not present

## 2020-12-04 DIAGNOSIS — Z170 Estrogen receptor positive status [ER+]: Secondary | ICD-10-CM | POA: Diagnosis not present

## 2020-12-04 LAB — CBC WITH DIFFERENTIAL (CANCER CENTER ONLY)
Abs Immature Granulocytes: 0.02 10*3/uL (ref 0.00–0.07)
Basophils Absolute: 0 10*3/uL (ref 0.0–0.1)
Basophils Relative: 1 %
Eosinophils Absolute: 0.1 10*3/uL (ref 0.0–0.5)
Eosinophils Relative: 2 %
HCT: 36.2 % (ref 36.0–46.0)
Hemoglobin: 11.6 g/dL — ABNORMAL LOW (ref 12.0–15.0)
Immature Granulocytes: 1 %
Lymphocytes Relative: 19 %
Lymphs Abs: 0.5 10*3/uL — ABNORMAL LOW (ref 0.7–4.0)
MCH: 26.6 pg (ref 26.0–34.0)
MCHC: 32 g/dL (ref 30.0–36.0)
MCV: 83 fL (ref 80.0–100.0)
Monocytes Absolute: 0.2 10*3/uL (ref 0.1–1.0)
Monocytes Relative: 7 %
Neutro Abs: 1.7 10*3/uL (ref 1.7–7.7)
Neutrophils Relative %: 70 %
Platelet Count: 145 10*3/uL — ABNORMAL LOW (ref 150–400)
RBC: 4.36 MIL/uL (ref 3.87–5.11)
RDW: 24.5 % — ABNORMAL HIGH (ref 11.5–15.5)
WBC Count: 2.4 10*3/uL — ABNORMAL LOW (ref 4.0–10.5)
nRBC: 0 % (ref 0.0–0.2)

## 2020-12-04 LAB — CMP (CANCER CENTER ONLY)
ALT: 9 U/L (ref 0–44)
AST: 15 U/L (ref 15–41)
Albumin: 4.2 g/dL (ref 3.5–5.0)
Alkaline Phosphatase: 191 U/L — ABNORMAL HIGH (ref 38–126)
Anion gap: 10 (ref 5–15)
BUN: 23 mg/dL — ABNORMAL HIGH (ref 6–20)
CO2: 27 mmol/L (ref 22–32)
Calcium: 9.8 mg/dL (ref 8.9–10.3)
Chloride: 105 mmol/L (ref 98–111)
Creatinine: 0.69 mg/dL (ref 0.44–1.00)
GFR, Estimated: >60 mL/min (ref >60)
Glucose, Bld: 115 mg/dL — ABNORMAL HIGH (ref 70–99)
Potassium: 3.7 mmol/L (ref 3.5–5.1)
Sodium: 142 mmol/L (ref 135–145)
Total Bilirubin: 0.5 mg/dL (ref 0.3–1.2)
Total Protein: 6.9 g/dL (ref 6.5–8.1)

## 2020-12-04 LAB — LACTATE DEHYDROGENASE: LDH: 154 U/L (ref 98–192)

## 2020-12-04 MED ORDER — FAMOTIDINE IN NACL 20-0.9 MG/50ML-% IV SOLN
INTRAVENOUS | Status: AC
  Filled 2020-12-04: qty 50

## 2020-12-04 MED ORDER — ZOLEDRONIC ACID 4 MG/100ML IV SOLN
4.0000 mg | Freq: Once | INTRAVENOUS | Status: AC
  Administered 2020-12-04: 4 mg via INTRAVENOUS
  Filled 2020-12-04: qty 100

## 2020-12-04 MED ORDER — SODIUM CHLORIDE 0.9 % IV SOLN
INTRAVENOUS | Status: AC
  Filled 2020-12-04 (×2): qty 250

## 2020-12-04 MED ORDER — FULVESTRANT 250 MG/5ML IM SOLN
500.0000 mg | Freq: Once | INTRAMUSCULAR | Status: AC
  Administered 2020-12-04: 500 mg via INTRAMUSCULAR

## 2020-12-04 MED ORDER — SODIUM CHLORIDE 0.9 % IV SOLN
40.0000 mg | Freq: Once | INTRAVENOUS | Status: AC
  Administered 2020-12-04: 40 mg via INTRAVENOUS
  Filled 2020-12-04: qty 4

## 2020-12-04 MED ORDER — METOCLOPRAMIDE HCL 5 MG/ML IJ SOLN
20.0000 mg | Freq: Once | INTRAVENOUS | Status: AC
  Administered 2020-12-04: 20 mg via INTRAVENOUS
  Filled 2020-12-04: qty 4

## 2020-12-04 MED ORDER — LORAZEPAM 0.5 MG PO TABS: 0.5000 mg | ORAL_TABLET | Freq: Three times a day (TID) | ORAL | 0 refills | Status: DC | PRN

## 2020-12-04 MED ORDER — ONDANSETRON HCL 8 MG PO TABS: 8.0000 mg | ORAL_TABLET | Freq: Three times a day (TID) | ORAL | 3 refills | Status: DC | PRN

## 2020-12-04 MED ORDER — PANTOPRAZOLE SODIUM 40 MG PO PACK: 40.0000 mg | PACK | Freq: Two times a day (BID) | ORAL | 4 refills | Status: DC

## 2020-12-04 MED ORDER — FULVESTRANT 250 MG/5ML IM SOLN
INTRAMUSCULAR | Status: AC
  Filled 2020-12-04: qty 10

## 2020-12-04 MED FILL — LORAZEPAM 0.5 MG TABS: 0.5 | 20 days supply | Qty: 60 | Fill #0

## 2020-12-04 MED FILL — ONDANSETRON HCL 8 MG TABLET: 8 | 24 days supply | Qty: 9 | Fill #0

## 2020-12-04 NOTE — Patient Instructions (Signed)
Dehydration, Adult Dehydration is condition in which there is not enough water or other fluids in the body. This happens when a person loses more fluids than he or she takes in. Important body parts cannot work right without the right amount of fluids. Any loss of fluids from the body can cause dehydration. Dehydration can be mild, worse, or very bad. It should be treated right away to keep it from getting very bad. What are the causes? This condition may be caused by:  Conditions that cause loss of water or other fluids, such as: ? Watery poop (diarrhea). ? Vomiting. ? Sweating a lot. ? Peeing (urinating) a lot.  Not drinking enough fluids, especially when you: ? Are ill. ? Are doing things that take a lot of energy to do.  Other illnesses and conditions, such as fever or infection.  Certain medicines, such as medicines that take extra fluid out of the body (diuretics).  Lack of safe drinking water.  Not being able to get enough water and food. What increases the risk? The following factors may make you more likely to develop this condition:  Having a long-term (chronic) illness that has not been treated the right way, such as: ? Diabetes. ? Heart disease. ? Kidney disease.  Being 65 years of age or older.  Having a disability.  Living in a place that is high above the ground or sea (high in altitude). The thinner, dried air causes more fluid loss.  Doing exercises that put stress on your body for a long time. What are the signs or symptoms? Symptoms of dehydration depend on how bad it is. Mild or worse dehydration  Thirst.  Dry lips or dry mouth.  Feeling dizzy or light-headed, especially when you stand up from sitting.  Muscle cramps.  Your body making: ? Dark pee (urine). Pee may be the color of tea. ? Less pee than normal. ? Less tears than normal.  Headache. Very bad dehydration  Changes in skin. Skin may: ? Be cold to the touch (clammy). ? Be blotchy  or pale. ? Not go back to normal right after you lightly pinch it and let it go.  Little or no tears, pee, or sweat.  Changes in vital signs, such as: ? Fast breathing. ? Low blood pressure. ? Weak pulse. ? Pulse that is more than 100 beats a minute when you are sitting still.  Other changes, such as: ? Feeling very thirsty. ? Eyes that look hollow (sunken). ? Cold hands and feet. ? Being mixed up (confused). ? Being very tired (lethargic) or having trouble waking from sleep. ? Short-term weight loss. ? Loss of consciousness. How is this treated? Treatment for this condition depends on how bad it is. Treatment should start right away. Do not wait until your condition gets very bad. Very bad dehydration is an emergency. You will need to go to a hospital.  Mild or worse dehydration can be treated at home. You may be asked to: ? Drink more fluids. ? Drink an oral rehydration solution (ORS). This drink helps get the right amounts of fluids and salts and minerals in the blood (electrolytes).  Very bad dehydration can be treated: ? With fluids through an IV tube. ? By getting normal levels of salts and minerals in your blood. This is often done by giving salts and minerals through a tube. The tube is passed through your nose and into your stomach. ? By treating the root cause. Follow these instructions at   home: Oral rehydration solution If told by your doctor, drink an ORS:  Make an ORS. Use instructions on the package.  Start by drinking small amounts, about  cup (120 mL) every 5-10 minutes.  Slowly drink more until you have had the amount that your doctor said to have. Eating and drinking  Drink enough clear fluid to keep your pee pale yellow. If you were told to drink an ORS, finish the ORS first. Then, start slowly drinking other clear fluids. Drink fluids such as: ? Water. Do not drink only water. Doing that can make the salt (sodium) level in your body get too low. ? Water  from ice chips you suck on. ? Fruit juice that you have added water to (diluted). ? Low-calorie sports drinks.  Eat foods that have the right amounts of salts and minerals, such as: ? Bananas. ? Oranges. ? Potatoes. ? Tomatoes. ? Spinach.  Do not drink alcohol.  Avoid: ? Drinks that have a lot of sugar. These include:  High-calorie sports drinks.  Fruit juice that you did not add water to.  Soda.  Caffeine. ? Foods that are greasy or have a lot of fat or sugar.         General instructions  Take over-the-counter and prescription medicines only as told by your doctor.  Do not take salt tablets. Doing that can make the salt level in your body get too high.  Return to your normal activities as told by your doctor. Ask your doctor what activities are safe for you.  Keep all follow-up visits as told by your doctor. This is important. Contact a doctor if:  You have pain in your belly (abdomen) and the pain: ? Gets worse. ? Stays in one place.  You have a rash.  You have a stiff neck.  You get angry or annoyed (irritable) more easily than normal.  You are more tired or have a harder time waking than normal.  You feel: ? Weak or dizzy. ? Very thirsty. Get help right away if you have:  Any symptoms of very bad dehydration.  Symptoms of vomiting, such as: ? You cannot eat or drink without vomiting. ? Your vomiting gets worse or does not go away. ? Your vomit has blood or green stuff in it.  Symptoms that get worse with treatment.  A fever.  A very bad headache.  Problems with peeing or pooping (having a bowel movement), such as: ? Watery poop that gets worse or does not go away. ? Blood in your poop (stool). This may cause poop to look black and tarry. ? Not peeing in 6-8 hours. ? Peeing only a small amount of very dark pee in 6-8 hours.  Trouble breathing. These symptoms may be an emergency. Do not wait to see if the symptoms will go away. Get  medical help right away. Call your local emergency services (911 in the [place] Do not drive yourself to the hospital. Summary  Dehydration is a condition in which there is not enough water or other fluids in the body. This happens when a person loses more fluids than he or she takes in.  Treatment for this condition depends on how bad it is. Treatment should be started right away. Do not wait until your condition gets very bad.  Drink enough clear fluid to keep your pee pale yellow. If you were told to drink an oral rehydration solution (ORS), finish the ORS first. Then, start slowly drinking other clear fluids.    Take over-the-counter and prescription medicines only as told by your doctor.  Get help right away if you have any symptoms of very bad dehydration. This information is not intended to replace advice given to you by your health care provider. Make sure you discuss any questions you have with your health care provider. Document Revised: 05/11/2019 Document Reviewed: 05/11/2019 Elsevier Patient Education  2021 [employer] Inc.  

## 2020-12-04 NOTE — Progress Notes (Signed)
Hematology and Oncology Follow Up Visit  Annette James [id number] 1961/03/14 60 y.o. 12/04/2020   Principle Diagnosis:   Metastatic breast cancer-bone only metastasis-ER positive/PR positive/HER2 negative  Current Therapy:    Faslodex 500 mg IM monthly-start on 11/06/2020  Ribociclib 600 mg p.o. daily (21 days on/7 days off)-start on 11/06/2020  Zometa 4 mg IV q. 3 months --start on 12/03/2020     Interim History:  Annette James is in for follow-up.  Unfortunately, she developed some kind of gastroenteritis.  She had a lot of nausea and vomiting.  This seemed to happen after she started Marinol.  I suppose this could happen.  She was not eating much for the past few days.  We will have to give her some IV fluid.  She really has nothing at home that she takes for nausea.  We will have to call in some medication for her.   I think that the Faslodex and ribociclib are helping.  Her ocular phosphates is coming down nicely.  We will have to see what her CA 27.29 is.  She has had no diarrhea.  There has been no fever.  She has had no bleeding.  She has had no cough or shortness of breath.  Currently, I would say her performance status is ECOG 1.   Medications:  Current Outpatient Medications:  .  ondansetron (ZOFRAN) 8 MG tablet, Take 1 tablet (8 mg total) by mouth every 8 (eight) hours as needed for nausea or vomiting., Disp: 60 tablet, Rfl: 3 .  amLODipine (NORVASC) 5 MG tablet, Take 1 tablet (5 mg total) by mouth daily., Disp: 30 tablet, Rfl: 1 .  ascorbic acid (VITAMIN C) 500 MG tablet, Take 500 mg by mouth daily., Disp: , Rfl:  .  Azilsartan-Chlorthalidone (EDARBYCLOR) 40-12.5 MG TABS, Take 1 tablet by mouth daily. (Patient not taking: Reported on 11/06/2020), Disp: 90 tablet, Rfl: 0 .  dronabinol (MARINOL) 5 MG capsule, Take 1 capsule (5 mg total) by mouth 2 (two) times daily before a meal., Disp: 60 capsule, Rfl: 0 .  fentaNYL (DURAGESIC) 25 MCG/HR, Place 1 patch onto the skin  every 3 (three) days., Disp: 10 patch, Rfl: 0 .  LORazepam (ATIVAN) 0.5 MG tablet, Take 30 minutes before scans or procedures as needed for anxiety., Disp: 15 tablet, Rfl: 0 .  metaxalone (SKELAXIN) 400 MG tablet, Take 1 tablet (400 mg total) by mouth 3 (three) times daily., Disp: 60 tablet, Rfl: 0 .  Multiple Vitamins-Minerals (ONE-A-DAY WOMENS 50 PLUS) TABS, Take 1 tablet by mouth daily., Disp: , Rfl:  .  Oxycodone HCl 10 MG TABS, Take 1 tablet (10 mg total) by mouth every 6 (six) hours as needed., Disp: 30 tablet, Rfl: 0 .  palbociclib (IBRANCE) 125 MG tablet, Take 1 tablet (125 mg total) by mouth daily. Take for 21 days on, 7 days off, repeat every 28 days., Disp: 21 tablet, Rfl: 4 .  thiamine (VITAMIN B-1) 50 MG tablet, Take 1 tablet (50 mg total) by mouth daily. (Patient not taking: Reported on 11/06/2020), Disp: 90 tablet, Rfl: 1  Current Facility-Administered Medications:  .  0.9 %  sodium chloride infusion, , Intravenous, Continuous, Toshiyuki Fredell, Rudell Cobb, MD .  famotidine (PEPCID) 40 mg in sodium chloride 0.9 % 100 mL IVPB, 40 mg, Intravenous, Once, Faizan Geraci, Rudell Cobb, MD .  metoCLOPramide (REGLAN) 20 mg in dextrose 5 % 50 mL IVPB, 20 mg, Intravenous, Once, Gaby Harney, Rudell Cobb, MD  Allergies: No Known Allergies  Past Medical History, Surgical  history, Social history, and Family History were reviewed and updated.  Review of Systems: Review of Systems  Constitutional: Negative.   HENT:  Negative.   Eyes: Negative.   Respiratory: Negative.   Cardiovascular: Negative.   Gastrointestinal: Negative.   Endocrine: Negative.   Genitourinary: Negative.    Musculoskeletal: Positive for back pain, neck pain and neck stiffness.  Neurological: Negative.   Hematological: Negative.   Psychiatric/Behavioral: Negative.     Physical Exam:  weight is 215 lb (97.5 kg). Her oral temperature is 98.4 F (36.9 C). Her blood pressure is 127/77 and her pulse is 84. Her respiration is 18 and oxygen saturation  is 98%.   Wt Readings from Last 3 Encounters:  12/04/20 215 lb (97.5 kg)  11/06/20 227 lb 4 oz (103.1 kg)  11/01/20 235 lb (106.6 kg)    Physical Exam Vitals reviewed.  Constitutional:      Comments: Her breast exam shows in the right breast a mass at about the 12 o'clock position.  This is firm.  This measures about 3-4 cm.  It is not mobile.  I cannot palpate any right axillary adenopathy.  HENT:     Head: Normocephalic and atraumatic.  Eyes:     Pupils: Pupils are equal, round, and reactive to light.  Cardiovascular:     Rate and Rhythm: Normal rate and regular rhythm.     Heart sounds: Normal heart sounds.  Pulmonary:     Effort: Pulmonary effort is normal.     Breath sounds: Normal breath sounds.  Abdominal:     General: Bowel sounds are normal.     Palpations: Abdomen is soft.  Musculoskeletal:        General: No tenderness or deformity. Normal range of motion.     Cervical back: Normal range of motion.  Lymphadenopathy:     Cervical: No cervical adenopathy.  Skin:    General: Skin is warm and dry.     Findings: No erythema or rash.  Neurological:     Mental Status: She is alert and oriented to person, place, and time.  Psychiatric:        Behavior: Behavior normal.        Thought Content: Thought content normal.        Judgment: Judgment normal.    Lab Results  Component Value Date   WBC 2.4 (L) 12/04/2020   HGB 11.6 (L) 12/04/2020   HCT 36.2 12/04/2020   MCV 83.0 12/04/2020   PLT 145 (L) 12/04/2020     Chemistry      Component Value Date/Time   NA 142 12/04/2020 1203   K 3.7 12/04/2020 1203   CL 105 12/04/2020 1203   CO2 27 12/04/2020 1203   BUN 23 (H) 12/04/2020 1203   CREATININE 0.69 12/04/2020 1203      Component Value Date/Time   CALCIUM 9.8 12/04/2020 1203   ALKPHOS 191 (H) 12/04/2020 1203   AST 15 12/04/2020 1203   ALT 9 12/04/2020 1203   BILITOT 0.5 12/04/2020 1203      Impression and Plan: Ms. Height is a very nice 60 year old  white female.  She has metastatic breast cancer.  Thankfully, that the disease is just in her bones.    Of note, we did do a molecular profile on her.  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  As such, we could certainly utilize  [place] if we needed to after frontline antiestrogen therapy.  We will going give her some IV fluids today.  I will give her some medication also with Pepcid and with Reglan to see this may help.  We will see you about calling her in some Zofran and some Ativan if some Pepcid.  I think all this might help with any nausea.  We will plan to get her back in another month.  If she has any problems I told her daughter, who is with her, to please call us.     Volanda Napoleon, MD 2/23/20221:48 PM

## 2020-12-04 NOTE — Telephone Encounter (Signed)
appts made per 12/04/20 los and she will rec sch in tx/avs    Annette James

## 2020-12-04 NOTE — Progress Notes (Signed)
Oncology Nurse Navigator Documentation  Oncology Nurse Navigator Flowsheets 12/04/2020  Abnormal Finding Date -  Confirmed Diagnosis Date -  Diagnosis Status -  Planned Course of Treatment -  Phase of Treatment -  Chemotherapy Pending- Reason: -  Chemotherapy Actual Start Date: -  Navigator Follow Up Date: 01/01/2021  Navigator Follow Up Reason: Follow-up Appointment;Chemotherapy  Navigator Location [facility]  Navigator Encounter Type Appt/Treatment Plan Review  Telephone -  Treatment Initiated Date -  Patient Visit Type MedOnc  Treatment Phase Active Tx  Barriers/Navigation Needs Coordination of Care;Education;Morbidities/Frailty  Education -  Interventions None Required  Acuity Level 2-Minimal Needs (1-2 Barriers Identified)  Referrals -  Coordination of Care -  Education Method -  Support Groups/Services Friends and Family  Time Spent with Patient 15

## 2020-12-05 ENCOUNTER — Telehealth: Payer: Self-pay

## 2020-12-05 LAB — IRON AND TIBC
Iron: 96 ug/dL (ref 41–142)
Saturation Ratios: 40 % (ref 21–57)
TIBC: 244 ug/dL (ref 236–444)
UIBC: 147 ug/dL (ref 120–384)

## 2020-12-05 LAB — CANCER ANTIGEN 27.29: CA 27.29: 105.2 U/mL — ABNORMAL HIGH (ref 0.0–38.6)

## 2020-12-05 LAB — FERRITIN: Ferritin: 512 ng/mL — ABNORMAL HIGH (ref 11–307)

## 2020-12-05 NOTE — Telephone Encounter (Signed)
Called and informed patient tumor level is down and treatment is working good, patient verbalized understanding and states she is feeling much better after her fluids and nausea meds yesterday. Understands to call if needed and follow up as scheduled.

## 2020-12-05 NOTE — Telephone Encounter (Signed)
-----   Message from Volanda Napoleon, MD sent at 12/05/2020  7:11 AM EST ----- Call - the tumor level is now down to 100!!   Great job!!  The treatment is working nicely!!!    PETE

## 2020-12-09 DIAGNOSIS — R9431 Abnormal electrocardiogram [ECG] [EKG]: Secondary | ICD-10-CM | POA: Diagnosis not present

## 2020-12-09 DIAGNOSIS — K5190 Ulcerative colitis, unspecified, without complications: Secondary | ICD-10-CM | POA: Diagnosis not present

## 2020-12-10 DIAGNOSIS — K529 Noninfective gastroenteritis and colitis, unspecified: Secondary | ICD-10-CM

## 2020-12-10 HISTORY — DX: Noninfective gastroenteritis and colitis, unspecified: K52.9

## 2020-12-13 ENCOUNTER — Encounter: Payer: Self-pay | Admitting: Hematology & Oncology

## 2020-12-16 ENCOUNTER — Emergency Department (HOSPITAL_COMMUNITY): Payer: BC Managed Care – PPO

## 2020-12-16 ENCOUNTER — Other Ambulatory Visit: Payer: Self-pay

## 2020-12-16 ENCOUNTER — Inpatient Hospital Stay (HOSPITAL_COMMUNITY)
Admission: EM | Admit: 2020-12-16 | Discharge: 2020-12-21 | DRG: 372 | Disposition: A | Discharge status: Home / Self Care | Payer: BC Managed Care – PPO | Attending: Internal Medicine | Admitting: Internal Medicine

## 2020-12-16 ENCOUNTER — Encounter (HOSPITAL_COMMUNITY): Payer: Self-pay

## 2020-12-16 DIAGNOSIS — Z803 Family history of malignant neoplasm of breast: Secondary | ICD-10-CM

## 2020-12-16 DIAGNOSIS — C50919 Malignant neoplasm of unspecified site of unspecified female breast: Secondary | ICD-10-CM

## 2020-12-16 DIAGNOSIS — E66811 Obesity, class 1: Secondary | ICD-10-CM

## 2020-12-16 DIAGNOSIS — R079 Chest pain, unspecified: Secondary | ICD-10-CM | POA: Diagnosis not present

## 2020-12-16 DIAGNOSIS — I959 Hypotension, unspecified: Secondary | ICD-10-CM | POA: Diagnosis present

## 2020-12-16 DIAGNOSIS — R109 Unspecified abdominal pain: Secondary | ICD-10-CM | POA: Diagnosis not present

## 2020-12-16 DIAGNOSIS — N6310 Unspecified lump in the right breast, unspecified quadrant: Secondary | ICD-10-CM | POA: Diagnosis not present

## 2020-12-16 DIAGNOSIS — I10 Essential (primary) hypertension: Secondary | ICD-10-CM | POA: Diagnosis not present

## 2020-12-16 DIAGNOSIS — Z79899 Other long term (current) drug therapy: Secondary | ICD-10-CM

## 2020-12-16 DIAGNOSIS — A0472 Enterocolitis due to Clostridium difficile, not specified as recurrent: Secondary | ICD-10-CM | POA: Diagnosis not present

## 2020-12-16 DIAGNOSIS — K529 Noninfective gastroenteritis and colitis, unspecified: Secondary | ICD-10-CM | POA: Diagnosis not present

## 2020-12-16 DIAGNOSIS — Z8249 Family history of ischemic heart disease and other diseases of the circulatory system: Secondary | ICD-10-CM

## 2020-12-16 DIAGNOSIS — T451X5A Adverse effect of antineoplastic and immunosuppressive drugs, initial encounter: Secondary | ICD-10-CM | POA: Diagnosis present

## 2020-12-16 DIAGNOSIS — C7951 Secondary malignant neoplasm of bone: Secondary | ICD-10-CM | POA: Diagnosis not present

## 2020-12-16 DIAGNOSIS — D72819 Decreased white blood cell count, unspecified: Secondary | ICD-10-CM | POA: Diagnosis not present

## 2020-12-16 DIAGNOSIS — E876 Hypokalemia: Secondary | ICD-10-CM | POA: Diagnosis present

## 2020-12-16 DIAGNOSIS — G893 Neoplasm related pain (acute) (chronic): Secondary | ICD-10-CM | POA: Diagnosis present

## 2020-12-16 DIAGNOSIS — Z6831 Body mass index (BMI) 31.0-31.9, adult: Secondary | ICD-10-CM | POA: Diagnosis not present

## 2020-12-16 DIAGNOSIS — D259 Leiomyoma of uterus, unspecified: Secondary | ICD-10-CM | POA: Diagnosis not present

## 2020-12-16 DIAGNOSIS — D630 Anemia in neoplastic disease: Secondary | ICD-10-CM | POA: Diagnosis present

## 2020-12-16 DIAGNOSIS — C50911 Malignant neoplasm of unspecified site of right female breast: Secondary | ICD-10-CM | POA: Diagnosis not present

## 2020-12-16 DIAGNOSIS — K429 Umbilical hernia without obstruction or gangrene: Secondary | ICD-10-CM | POA: Diagnosis not present

## 2020-12-16 DIAGNOSIS — J9811 Atelectasis: Secondary | ICD-10-CM | POA: Diagnosis not present

## 2020-12-16 DIAGNOSIS — R933 Abnormal findings on diagnostic imaging of other parts of digestive tract: Secondary | ICD-10-CM | POA: Diagnosis not present

## 2020-12-16 DIAGNOSIS — Z20822 Contact with and (suspected) exposure to covid-19: Secondary | ICD-10-CM | POA: Diagnosis not present

## 2020-12-16 DIAGNOSIS — E669 Obesity, unspecified: Secondary | ICD-10-CM

## 2020-12-16 DIAGNOSIS — K6389 Other specified diseases of intestine: Secondary | ICD-10-CM | POA: Diagnosis not present

## 2020-12-16 DIAGNOSIS — D701 Agranulocytosis secondary to cancer chemotherapy: Secondary | ICD-10-CM | POA: Diagnosis not present

## 2020-12-16 HISTORY — DX: Obesity, unspecified: E66.9

## 2020-12-16 HISTORY — DX: Obesity, class 1: E66.811

## 2020-12-16 LAB — CBC WITH DIFFERENTIAL/PLATELET
Abs Immature Granulocytes: 0.02 10*3/uL (ref 0.00–0.07)
Basophils Absolute: 0.1 10*3/uL (ref 0.0–0.1)
Basophils Relative: 3 %
Eosinophils Absolute: 0 10*3/uL (ref 0.0–0.5)
Eosinophils Relative: 1 %
HCT: 37.8 % (ref 36.0–46.0)
Hemoglobin: 12.3 g/dL (ref 12.0–15.0)
Immature Granulocytes: 1 %
Lymphocytes Relative: 10 %
Lymphs Abs: 0.4 10*3/uL — ABNORMAL LOW (ref 0.7–4.0)
MCH: 28 pg (ref 26.0–34.0)
MCHC: 32.5 g/dL (ref 30.0–36.0)
MCV: 85.9 fL (ref 80.0–100.0)
Monocytes Absolute: 0.2 10*3/uL (ref 0.1–1.0)
Monocytes Relative: 5 %
Neutro Abs: 3 10*3/uL (ref 1.7–7.7)
Neutrophils Relative %: 80 %
Platelets: 343 10*3/uL (ref 150–400)
RBC: 4.4 MIL/uL (ref 3.87–5.11)
RDW: 25.5 % — ABNORMAL HIGH (ref 11.5–15.5)
WBC: 3.7 10*3/uL — ABNORMAL LOW (ref 4.0–10.5)
nRBC: 0 % (ref 0.0–0.2)

## 2020-12-16 LAB — COMPREHENSIVE METABOLIC PANEL
ALT: 12 U/L (ref 0–44)
AST: 17 U/L (ref 15–41)
Albumin: 3.8 g/dL (ref 3.5–5.0)
Alkaline Phosphatase: 190 U/L — ABNORMAL HIGH (ref 38–126)
Anion gap: 12 (ref 5–15)
BUN: 10 mg/dL (ref 6–20)
CO2: 21 mmol/L — ABNORMAL LOW (ref 22–32)
Calcium: 8.7 mg/dL — ABNORMAL LOW (ref 8.9–10.3)
Chloride: 106 mmol/L (ref 98–111)
Creatinine, Ser: 0.54 mg/dL (ref 0.44–1.00)
GFR, Estimated: >60 mL/min (ref >60)
Glucose, Bld: 127 mg/dL — ABNORMAL HIGH (ref 70–99)
Potassium: 3.2 mmol/L — ABNORMAL LOW (ref 3.5–5.1)
Sodium: 139 mmol/L (ref 135–145)
Total Bilirubin: 0.7 mg/dL (ref 0.3–1.2)
Total Protein: 6.8 g/dL (ref 6.5–8.1)

## 2020-12-16 LAB — TROPONIN I (HIGH SENSITIVITY): Troponin I (High Sensitivity): <2 ng/L (ref <18)

## 2020-12-16 LAB — LIPASE, BLOOD: Lipase: 25 U/L (ref 11–51)

## 2020-12-16 MED ORDER — IOHEXOL 350 MG/ML SOLN
100.0000 mL | Freq: Once | INTRAVENOUS | Status: AC | PRN
  Administered 2020-12-16: 100 mL via INTRAVENOUS

## 2020-12-16 MED ORDER — KETOROLAC TROMETHAMINE 30 MG/ML IJ SOLN
30.0000 mg | Freq: Once | INTRAMUSCULAR | Status: AC
  Administered 2020-12-16: 30 mg via INTRAVENOUS
  Filled 2020-12-16: qty 1

## 2020-12-16 MED ORDER — HYDROMORPHONE HCL 1 MG/ML IJ SOLN
1.0000 mg | Freq: Once | INTRAMUSCULAR | Status: AC
  Administered 2020-12-16: 1 mg via INTRAVENOUS
  Filled 2020-12-16: qty 1

## 2020-12-16 MED ORDER — ONDANSETRON HCL 4 MG PO TABS: 4.0000 mg | ORAL_TABLET | Freq: Four times a day (QID) | ORAL | Status: DC | PRN

## 2020-12-16 MED ORDER — HYDROMORPHONE HCL 1 MG/ML IJ SOLN
1.0000 mg | Freq: Once | INTRAMUSCULAR | Status: AC
Start: 2020-12-16 — End: 2020-12-16
  Administered 2020-12-16: 1 mg via INTRAVENOUS
  Filled 2020-12-16: qty 1

## 2020-12-16 MED ORDER — ACETAMINOPHEN 650 MG RE SUPP: 650.0000 mg | Freq: Four times a day (QID) | RECTAL | Status: DC | PRN

## 2020-12-16 MED ORDER — ENOXAPARIN SODIUM 40 MG/0.4ML ~~LOC~~ SOLN
40.0000 mg | SUBCUTANEOUS | Status: DC
  Administered 2020-12-17 – 2020-12-21 (×5): 40 mg via SUBCUTANEOUS
  Filled 2020-12-16 (×5): qty 0.4

## 2020-12-16 MED ORDER — ACETAMINOPHEN 325 MG PO TABS: 650.0000 mg | ORAL_TABLET | Freq: Four times a day (QID) | ORAL | Status: DC | PRN

## 2020-12-16 MED ORDER — PIPERACILLIN-TAZOBACTAM 3.375 G IVPB 30 MIN
3.3750 g | Freq: Once | INTRAVENOUS | Status: AC
  Administered 2020-12-16: 3.375 g via INTRAVENOUS
  Filled 2020-12-16: qty 50

## 2020-12-16 MED ORDER — METOCLOPRAMIDE HCL 5 MG/ML IJ SOLN
10.0000 mg | Freq: Once | INTRAMUSCULAR | Status: AC
  Administered 2020-12-16: 10 mg via INTRAVENOUS
  Filled 2020-12-16: qty 2

## 2020-12-16 MED ORDER — ONDANSETRON HCL 4 MG/2ML IJ SOLN
4.0000 mg | Freq: Four times a day (QID) | INTRAMUSCULAR | Status: DC | PRN
  Administered 2020-12-17 – 2020-12-20 (×2): 4 mg via INTRAVENOUS
  Filled 2020-12-16: qty 2

## 2020-12-16 MED ORDER — POTASSIUM CHLORIDE IN NACL 20-0.9 MEQ/L-% IV SOLN
INTRAVENOUS | Status: AC
  Filled 2020-12-16 (×3): qty 1000

## 2020-12-16 MED ORDER — POTASSIUM CHLORIDE 10 MEQ/100ML IV SOLN
10.0000 meq | INTRAVENOUS | Status: AC
Start: 2020-12-17 — End: 2020-12-17
  Administered 2020-12-17 (×2): 10 meq via INTRAVENOUS
  Filled 2020-12-16 (×2): qty 100

## 2020-12-16 NOTE — ED Provider Notes (Signed)
[facility] DEPT Provider Note   CSN: [id number] Arrival date & time: 12/16/20  1754     History Chief Complaint  Patient presents with  . Flank Pain    Annette James is a 60 y.o. female.  HPI 60 year old female with a history of breast cancer with metastasis to the bone, currently undergoing chemotherapy, hypertension presents to the ER with complaints of onset of left lower quadrant pain which then developed into left flank pain this morning.  Patient states that onset was fairly gradual, but now is constant, sharp, 10/10.  She had some nausea and vomiting over the weekend but she and her family had attributed this due to her chemotherapy which she received a few days prior.  She was seen by her oncologist Dr. Marin Olp who had concerns for possible PE.  I did speak with him over the phone.  She denies any chest pain or shortness of breath, states that her pain does get worse when she breathes in however.  She denies any syncope, though does endorse dizziness from the pain.  No prior history of kidney stones, denies any dysuria, hematuria, sister at bedside reports that she does have a history of an of left-sided ovarian cyst.    Past Medical History:  Diagnosis Date  . Breast cancer metastasized to bone, unspecified laterality ([place]) 11/01/2020  . Chicken pox   . Goals of care, counseling/discussion 10/19/2020  . Metastatic cancer to spine [facility]) 10/19/2020    Patient Active Problem List   Diagnosis Date Noted  . 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 deficiency 11/02/2020  . Breast cancer metastasized to bone, unspecified laterality ([place]) 11/01/2020  . Deficiency anemia 10/30/2020  . Primary hypertension 10/30/2020  . Encounter for general adult medical examination with abnormal findings 10/30/2020  . Iron deficiency anemia due to chronic blood loss 10/30/2020  . Breast mass, right 10/19/2020  . Goals of care, counseling/discussion 10/19/2020  . Labyrinthitis  10/18/2020  . Lytic bone lesions on xray 10/18/2020    Past Surgical History:  Procedure Laterality Date  . APPENDECTOMY    . CESAREAN SECTION     x 4  . CHOLECYSTECTOMY       OB History   No obstetric history on file.     Family History  Problem Relation Age of Onset  . Breast cancer Mother   . Hypertension Mother   . Colon cancer Father   . Hypertension Father   . Alcoholism Brother   . Alcoholism Brother     Social History   Tobacco Use  . Smoking status: Never Smoker  . Smokeless tobacco: Never Used  Substance Use Topics  . Alcohol use: Not Currently  . Drug use: Not Currently    Home Medications Prior to Admission medications   Medication Sig Start Date End Date Taking? Authorizing Provider  ondansetron (ZOFRAN) 8 MG tablet Take 1 tablet (8 mg total) by mouth every 8 (eight) hours as needed for nausea or vomiting. 11/14/20   Volanda Napoleon, MD  amLODipine (NORVASC) 5 MG tablet Take 1 tablet (5 mg total) by mouth daily. 10/21/20   Donnamae Jude, MD  ascorbic acid (VITAMIN C) 500 MG tablet Take 500 mg by mouth daily.    [provider]  Azilsartan-Chlorthalidone (EDARBYCLOR) 40-12.5 MG TABS Take 1 tablet by mouth daily. Patient not taking: Reported on 11/06/2020 10/30/20   [name], MD  dronabinol (MARINOL) 5 MG capsule Take 1 capsule (5 mg total) by mouth 2 (  two) times daily before a meal. 11/19/20   Ennever, Rudell Cobb, MD  fentaNYL (DURAGESIC) 25 MCG/HR Place 1 patch onto the skin every 3 (three) days. 11/01/20   Volanda Napoleon, MD  LORazepam (ATIVAN) 0.5 MG tablet Take 30 minutes before scans or procedures as needed for anxiety. 10/23/20   Volanda Napoleon, MD  LORazepam (ATIVAN) 0.5 MG tablet Take 1 tablet (0.5 mg total) by mouth every 8 (eight) hours as needed for anxiety (nausea). 12/04/20   Volanda Napoleon, MD  metaxalone (SKELAXIN) 400 MG tablet Take 1 tablet (400 mg total) by mouth 3 (three) times daily. 11/11/20   [name], MD  Multiple  Vitamins-Minerals (ONE-A-DAY WOMENS 50 PLUS) TABS Take 1 tablet by mouth daily.    [provider]  ondansetron (ZOFRAN) 8 MG tablet Take 1 tablet (8 mg total) by mouth every 8 (eight) hours as needed for nausea or vomiting. 12/04/20   Volanda Napoleon, MD  Oxycodone HCl 10 MG TABS Take 1 tablet (10 mg total) by mouth every 6 (six) hours as needed. 11/06/20   Volanda Napoleon, MD  palbociclib (IBRANCE) 125 MG tablet Take 1 tablet (125 mg total) by mouth daily. Take for 21 days on, 7 days off, repeat every 28 days. 11/06/20   Volanda Napoleon, MD  pantoprazole sodium (PROTONIX) 40 mg/20 mL PACK Place 20 mLs (40 mg total) into feeding tube 2 (two) times daily. 12/04/20   Volanda Napoleon, MD  thiamine (VITAMIN B-1) 50 MG tablet Take 1 tablet (50 mg total) by mouth daily. Patient not taking: Reported on 11/06/2020 11/06/20   Volanda Napoleon, MD    Allergies    Patient has no known allergies.  Review of Systems   Review of Systems  Physical Exam Updated Vital Signs BP 131/86   Pulse 98   Temp 98.3 F (36.8 C) (Oral)   Resp 18   Ht 5\' 8"  (1.727 m)   Wt 95.3 kg   SpO2 96%   BMI 31.93 kg/m   Physical Exam Vitals and nursing note reviewed.  Constitutional:      General: She is in acute distress.     Appearance: She is well-developed and well-nourished. She is not diaphoretic.     Comments: Appears in pain  HENT:     Head: Normocephalic and atraumatic.  Eyes:     Conjunctiva/sclera: Conjunctivae normal.  Cardiovascular:     Rate and Rhythm: Normal rate and regular rhythm.     Pulses: Normal pulses.     Heart sounds: No murmur heard.   Pulmonary:     Effort: Pulmonary effort is normal. No respiratory distress.     Breath sounds: Normal breath sounds. No rales.  Chest:     Chest wall: No tenderness.  Abdominal:     Palpations: Abdomen is soft.     Tenderness: There is abdominal tenderness. There is left CVA tenderness. There is no right CVA tenderness.     Comments:  Moderate left lower quadrant tenderness and significant left CVA tenderness on exam.  No right upper or right lower quadrant tenderness, no right CVA tenderness.  Musculoskeletal:        General: No tenderness or edema. Normal range of motion.     Cervical back: Neck supple.  Skin:    General: Skin is warm and dry.  Neurological:     General: No focal deficit present.     Mental Status: She is alert and oriented to  person, place, and time.     Sensory: No sensory deficit.     Motor: No weakness.  Psychiatric:        Mood and Affect: Mood and affect normal.     ED Results / Procedures / Treatments   Labs (all labs ordered are listed, but only abnormal results are displayed) Labs Reviewed  CBC WITH DIFFERENTIAL/PLATELET - Abnormal; Notable for the following components:      Result Value   WBC 3.7 (*)    RDW 25.5 (*)    Lymphs Abs 0.4 (*)    All other components within normal limits  COMPREHENSIVE METABOLIC PANEL - Abnormal; Notable for the following components:   Potassium 3.2 (*)    CO2 21 (*)    Glucose, Bld 127 (*)    Calcium 8.7 (*)    Alkaline Phosphatase 190 (*)    All other components within normal limits  RESP PANEL BY RT-PCR (FLU A&B, COVID) ARPGX2  LIPASE, BLOOD  URINALYSIS, ROUTINE W REFLEX MICROSCOPIC  TROPONIN I (HIGH SENSITIVITY)  TROPONIN I (HIGH SENSITIVITY)    EKG None  Radiology CT Angio Chest PE W and/or Wo Contrast  Result Date: 12/16/2020 CLINICAL DATA:  PE suspected, high prob Left flank pain. Recent diagnosis of breast cancer with bony metastasis. EXAM: CT ANGIOGRAPHY CHEST WITH CONTRAST TECHNIQUE: Multidetector CT imaging of the chest was performed using the standard protocol during bolus administration of intravenous contrast. Multiplanar CT image reconstructions and MIPs were obtained to evaluate the vascular anatomy. CONTRAST:  151mL OMNIPAQUE IOHEXOL 350 MG/ML SOLN COMPARISON:  Chest radiograph earlier today. PET-CT 11/05/2020. Chest CT  10/19/2020 FINDINGS: Cardiovascular: There are no filling defects within the pulmonary arteries to suggest pulmonary embolus. Thoracic aorta is normal in caliber. Conventional branching pattern from the aortic arch. No dissection or acute aortic finding. Normal heart size. Trace pericardial effusion measures up to 7 mm. Mediastinum/Nodes: No enlarged mediastinal, hilar, or axillary nodes. No esophageal wall thickening. No thyroid nodule. Lungs/Pleura: Mild breathing motion artifact which limits assessment, particularly at the lung bases. No acute airspace disease. Trace right pleural effusion, similar to prior. No pulmonary nodule. Mild central bronchial thickening. Upper Abdomen: Assessed on concurrent abdominal CT, reported separately. Musculoskeletal: Known diffuse osseous metastatic disease with mixed lytic and sclerotic lesions. Unchanged mild compression fractures of T8 and T11. No displaced pathologic fracture. Right breast mass may be slightly smaller largest dimension 2.5 cm, previously 3.3 cm. Review of the MIP images confirms the above findings. IMPRESSION: 1. No pulmonary embolus. 2. Mild central bronchial thickening, can be seen with bronchitis or reactive airways disease. 3. Known diffuse osseous metastatic disease. Unchanged mild compression fractures of T8 and T11. No evidence of acute or pathologic fracture. 4. Right breast mass may be slightly smaller from prior exam. Electronically Signed   By: Keith Rake M.D.   On: 12/16/2020 21:58   CT ABDOMEN PELVIS W CONTRAST  Result Date: 12/16/2020 CLINICAL DATA:  Left flank pain, onset today. EXAM: CT ABDOMEN AND PELVIS WITH CONTRAST TECHNIQUE: Multidetector CT imaging of the abdomen and pelvis was performed using the standard protocol following bolus administration of intravenous contrast. CONTRAST:  12mL OMNIPAQUE IOHEXOL 350 MG/ML SOLN COMPARISON:  PET CT 11/05/2020, abdominopelvic CT 10/19/2020 FINDINGS: Lower chest: Assessed on concurrent  chest CT, reported separately. Hepatobiliary: No focal liver abnormality is seen. Status post cholecystectomy. No biliary dilatation. Pancreas: Unremarkable. No pancreatic ductal dilatation or surrounding inflammatory changes. Spleen: Normal in size without focal abnormality. Small splenule is anteriorly.  Adrenals/Urinary Tract: Normal adrenal glands. No hydronephrosis. No visualized renal calculi. No perinephric edema. No focal renal abnormality. Urinary bladder is partially distended. Stomach/Bowel: Bowel evaluation is limited in the absence of enteric contrast. Decompressed stomach. Normal positioning of the duodenum and ligament of Treitz. No small bowel obstruction. Occasional fluid-filled distal small bowel loops without inflammation or wall thickening. Appendectomy with surgical sutures base of the cecum. Short segment colonic wall thickening with mild pericolonic edema involving the proximal descending colon, series 5, image 49. There is also wall thickening in the mid descending colon through the proximal sigmoid with mild pericolonic fat stranding. Occasional distal colonic diverticula but no focal diverticulitis. Vascular/Lymphatic: Normal caliber abdominal aorta. Patent portal and mesenteric veins. No acute vascular findings. No enlarged lymph nodes in the abdomen or pelvis. Reproductive: Bulbous appearance of the uterus with fibroids. Septated left ovarian cyst versus 2 adjacent cysts measuring 4.8 x 2.7 cm, unchanged allowing for differences in caliper placement. Right adnexa is unremarkable. Other: Trace free fluid in the dependent pelvis. No free air or focal fluid collection. Tiny fat containing umbilical hernia. No omental thickening. Musculoskeletal: Known diffuse osseous metastatic disease throughout the visualized skeleton with mixed lytic and sclerotic lesions. A destructive lesion involving the left iliac bone in anterior iliac crest may contribute to flank pain. No evidence of acute  pathologic fracture. Previously questioned right sacral fracture is not definitively seen. IMPRESSION: 1. Findings consistent with colitis involving the descending and proximal sigmoid colon. 2. Known diffuse osseous metastatic disease throughout the visualized skeleton with mixed lytic and sclerotic lesions. A destructive lesion involving the left iliac bone and anterior iliac crest may contribute to flank pain. No evidence of acute pathologic fracture. 3. Septated left ovarian cyst versus 2 adjacent cysts measuring 4.8 x 2.7 cm, unchanged allowing for differences in caliper placement. 4. Uterine fibroids. Electronically Signed   By: Keith Rake M.D.   On: 12/16/2020 22:07   DG Chest Portable 1 View  Result Date: 12/16/2020 CLINICAL DATA:  Left flank pain, history of metastatic breast cancer EXAM: PORTABLE CHEST 1 VIEW COMPARISON:  Radiograph 10/18/2020 FINDINGS: Low lung volumes and vascular crowding. No consolidative process, pneumothorax or effusion is seen. Stable cardiomediastinal contours accounting for differences in technique and inflation. The aorta is calcified. The remaining cardiomediastinal contours are unremarkable. Degenerative changes are present in the imaged spine and shoulders. Dextrocurvature of the midthoracic levels. Telemetry leads overlie the chest. IMPRESSION: 1. Low lung volumes and atelectasis with vascular crowding. 2. No other acute cardiopulmonary abnormality. Electronically Signed   By: Lovena Le M.D.   On: 12/16/2020 20:17    Procedures Procedures   Medications Ordered in ED Medications  HYDROmorphone (DILAUDID) injection 1 mg (has no administration in time range)  piperacillin-tazobactam (ZOSYN) IVPB 3.375 g (has no administration in time range)  HYDROmorphone (DILAUDID) injection 1 mg (1 mg Intravenous Given 12/16/20 2010)  metoCLOPramide (REGLAN) injection 10 mg (10 mg Intravenous Given 12/16/20 2009)  HYDROmorphone (DILAUDID) injection 1 mg (1 mg Intravenous  Given 12/16/20 2156)  ketorolac (TORADOL) 30 MG/ML injection 30 mg (30 mg Intravenous Given 12/16/20 2157)  iohexol (OMNIPAQUE) 350 MG/ML injection 100 mL (100 mLs Intravenous Contrast Given 12/16/20 2134)    ED Course  I have reviewed the triage vital signs and the nursing notes.  Pertinent labs & imaging results that were available during my care of the patient were reviewed by me and considered in my medical decision making (see chart for details).  Clinical Course as of  12/16/20 2250  Mon Dec 17, 3547  626 61 year old female who presents from her oncologist with left-sided flank pain and left lower quadrant pain, onset this morning.  On arrival, she is very uncomfortable appearing, moaning in pain.  Vitals overall are reassuring, not tachycardic, tachypneic or hypoxic.  She has significant left lower quadrant tenderness and flank tenderness on exam.  Lung sounds are clear.  I did speak with the patient's oncologist who is concerned about PE.  She does have risk factors, and does state that the pain is worse with inspiration, however she has no chest pain, no shortness of breath.  Pain appears to be more so in the lower abdomen.  However plan for basic labs, CBC, CMP, UA, lipase and troponin.  Chest x-ray with low lung volumes but no other acute abnormalities.  Plan for PE study and CT abdomen pelvis to rule out possible kidney stone/ruptured cyst.  Other things on the differential include diverticulitis, metastases, small bowel obstruction, gastroenteritis, less likely dissection given reassuring vitals and no chest pain [MB]  2024 Patient provided analgesia with Dilaudid and Reglan for nausea. [MB]  2223 CT ABDOMEN PELVIS W CONTRAST  IMPRESSION: 1. Findings consistent with colitis involving the descending and proximal sigmoid colon. 2. Known diffuse osseous metastatic disease throughout the visualized skeleton with mixed lytic and sclerotic lesions. A destructive lesion involving the left  iliac bone and anterior iliac crest may contribute to flank pain. No evidence of acute pathologic fracture. 3. Septated left ovarian cyst versus 2 adjacent cysts measuring 4.8 x 2.7 cm, unchanged allowing for differences in caliper placement. 4. Uterine fibroids.  [MB]  2224 Patient CT scan with evidence of colitis.  Patient has received 2 mg of Dilaudid, Toradol, with some improvement in her pain, but still having pretty significant pain.  Given his undergoing chemo, I feel as though it would be appropriate to have the patient admitted for IV antibiotics, pain management, and close monitoring.  Initiated Zosyn for treatment given severe pain.  I discussed this with the patient who is agreeable.  Consulted hospitalist team for admission. [MB]  [place] Dr. Olevia Bowens will admit the patient for further evaluation and treatment. [MB]    Clinical Course User Index [MB] Lyndel Safe   MDM Rules/Calculators/A&P                           Final Clinical Impression(s) / ED Diagnoses Final diagnoses:  Colitis    Rx / DC Orders ED Discharge Orders    None       Lyndel Safe 12/16/20 2251    Milton Ferguson, MD 12/17/20 2310

## 2020-12-16 NOTE — ED Notes (Signed)
Pt in triage room after triage d/t chemo. Sister is here, sister is very agitated and came out of room very shortly after triage was complete and stated "A doctor better see my sister soon and do something, or I'm going to lose my shit!". Charge made aware. Attempted to explain to sister that there are currently no available rooms and the providers were busy d/t recent influx of patients and we would attempt to have a MD see the patient ASAP. Sister is pacing in room and speaking about how we need to do more and that the people in rooms "aren't even emergencies, they probably just have broken toes". Attempted to explain two more times that we are currently very busy and doing the best we can and hopefully a provider could come see the patient soon.

## 2020-12-16 NOTE — ED Triage Notes (Signed)
Pt c/o left flank pain starting today. Denies hx of kidney stones, hematuria, or dysuria. Pt c/o nausea, but is currently taking chemo meds and thinks that contributes to nausea

## 2020-12-16 NOTE — ED Notes (Signed)
Pt's family member came to the desk and was out of control demanding " I need someone to see my sister now.  She is not like I have never seen her before.   I am going to lose my shit if someone does not come in this room now.   Later after going into the room to get vital signs and see the pt, her sister said my father's bladder almost exploded in this emergency room and you need to treat this like an emergency.   She further said that some people come for emergencies and not just small stuff.

## 2020-12-16 NOTE — ED Notes (Signed)
Report called to Sheree'ah, RN on 6E

## 2020-12-16 NOTE — H&P (Signed)
History and Physical    Annette James TWK:[id number] DOB: 1961/05/26 DOA: 12/16/2020  PCP: [name], MD  Patient coming from: Home.  I have personally briefly reviewed patient's old medical records in [place]  Chief Complaint: Abdominal pain.  HPI: Annette James is a 60 y.o. 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  She has also been having nausea, emesis and some loose stools since the weekend, which her and her family attributed to recent chemotherapy treatment last week.  No constipation, melena or hematochezia.  No dysuria, frequency or hematuria.  The pain gets worse with deep breathing.  However, she denies cough, wheezing or hemoptysis.  No chest pain, palpitations, diaphoresis, PND orthopnea.  She mentions that she has been lightheaded when the pain has been very intense.  She denies polyuria, polydipsia, polyphagia or blurred vision.  ED Course: Initial vital signs were temperature  due to hypotension.  Initial vital signs temperature 98.3 F, pulse 102, respirations 17, BP 152/93 mmHg O2 sat 96% on room air.  The patient received Zosyn, hydromorphone 1 mg IVP x3, morphine 1 mg IVP x1 and Toradol 30 mg IVP in the emergency department.  Labwork: CBC shows a white count 3.7, hemoglobin 12.3 g/dL platelets 383.  Troponin x2 was negative.  Lipase was normal.  Coronavirus and influenza PCR was negative.  CMP shows a potassium of 3.2 and CO2 of 21 mmol/L.  Glucose of 127 and calcium of 8.7 mg/dL.  Alkaline phosphatase was 190 units/L.  The rest of the CMP tests are within expected range.  Imaging: 1 view chest radiograph showed low lung volumes with atelectasis and vascular crowding, but no other acute  abnormality.  CT abdomen/pelvis with contrast shows findings consistent with colitis involving the descending and proximal sigmoid colon.  Known diffuse osseous metastatic disease throughout the visualized skeleton with mixed lytic and this Chloroptic lesions.  There is a destructive lesion involving the left iliac bone and anterior iliac crest, which may be contributing to the patient's flank pain.  There is no evidence of pathological fracture.  Please see images and full values report for further detail.  Review of Systems: As per HPI otherwise all other systems reviewed and are negative.  Past Medical History:  Diagnosis Date  . Breast cancer metastasized to bone, unspecified laterality ([place]) 11/01/2020  . Chicken pox   . Class 1 obesity 12/16/2020  . Goals of care, counseling/discussion 10/19/2020  . Metastatic cancer to spine ([place]) 10/19/2020    Past Surgical History:  Procedure Laterality Date  . APPENDECTOMY    . CESAREAN SECTION     x 4  . CHOLECYSTECTOMY      Social History  reports that she has never smoked. She has never used smokeless tobacco. She reports previous alcohol use. She reports previous drug use.  No Known Allergies  Family History  Problem Relation Age of Onset  . Breast cancer Mother   . Hypertension Mother   . Colon cancer Father   . Hypertension Father   . Alcoholism Brother   . Alcoholism Brother    Prior to Admission medications   Medication Sig Start Date End Date Taking? Authorizing Provider  amLODipine (NORVASC) 5 MG tablet Take 1 tablet (5 mg total) by  mouth daily. 10/21/20  Yes Donnamae Jude, MD  ascorbic acid (VITAMIN C) 500 MG tablet Take 500 mg by mouth daily.   Yes [provider]  fentaNYL (DURAGESIC) 25 MCG/HR Place 1 patch onto the skin every 3 (three) days. 11/01/20  Yes Ennever, Rudell Cobb, MD  LORazepam (ATIVAN) 0.5 MG tablet Take 1 tablet (0.5 mg total) by mouth every 8 (eight) hours as needed for anxiety (nausea). 12/04/20  Yes  Volanda Napoleon, MD  metaxalone (SKELAXIN) 400 MG tablet Take 1 tablet (400 mg total) by mouth 3 (three) times daily. 11/11/20  Yes [name], MD  Multiple Vitamins-Minerals (ONE-A-DAY WOMENS 50 PLUS) TABS Take 1 tablet by mouth daily.   Yes [provider]  ondansetron (ZOFRAN) 8 MG tablet Take 1 tablet (8 mg total) by mouth every 8 (eight) hours as needed for nausea or vomiting. 12/04/20  Yes Ennever, Rudell Cobb, MD  palbociclib (IBRANCE) 125 MG tablet Take 1 tablet (125 mg total) by mouth daily. Take for 21 days on, 7 days off, repeat every 28 days. 11/06/20  Yes Volanda Napoleon, MD  dronabinol (MARINOL) 5 MG capsule Take 1 capsule (5 mg total) by mouth 2 (two) times daily before a meal. 11/19/20   Ennever, Rudell Cobb, MD  pantoprazole sodium (PROTONIX) 40 mg/20 mL PACK Place 20 mLs (40 mg total) into feeding tube 2 (two) times daily. 12/04/20   Volanda Napoleon, MD    Physical Exam: Vitals:   12/16/20 1831 12/16/20 2016 12/16/20 2115 12/16/20 2215  BP: 115/81 133/85 140/80 131/86  Pulse: 95 89 89 98  Resp:  14 18 18   Temp:      TempSrc:      SpO2: 99% 92% 93% 96%  Weight:      Height:        Constitutional: Looks chronically ill, but in NAD. Eyes: PERRL, lids and conjunctivae injected. ENMT: Mucous membranes are dry. Posterior pharynx clear of any exudate or lesions. Neck: normal, supple, no masses, no thyromegaly Respiratory: Decreased breath sounds in bases, otherwise clear to auscultation bilaterally, no wheezing, no crackles.  No accessory muscle use.  Cardiovascular: Regular rate and rhythm, no murmurs / rubs / gallops. No extremity edema. 2+ pedal pulses. No carotid bruits.  Abdomen: Obese, no distention.  Bowel sounds positive.  Soft, positive LUQ, LLQ and left flank tenderness without guarding or rebound, no masses palpated. No hepatosplenomegaly. Musculoskeletal: Mild generalized weakness.  No clubbing / cyanosis.  Good ROM, no contractures. Normal muscle tone.  Skin:  no rashes, lesions, ulcers.  Neurologic: CN 2-12 grossly intact. Sensation intact, DTR normal. Strength 5/5 in all 4.  Psychiatric: Normal judgment and insight. Alert and oriented x 3. Normal mood.   Labs on Admission: I have personally reviewed following labs and imaging studies  CBC: Recent Labs  Lab 12/16/20 2012  WBC 3.7*  NEUTROABS 3.0  HGB 12.3  HCT 37.8  MCV 85.9  PLT 258    Basic Metabolic Panel: Recent Labs  Lab 12/16/20 2012  NA 139  K 3.2*  CL 106  CO2 21*  GLUCOSE 127*  BUN 10  CREATININE 0.54  CALCIUM 8.7*    GFR: Estimated Creatinine Clearance: 93.7 mL/min (by C-G formula based on SCr of 0.54 mg/dL).  Liver Function Tests: Recent Labs  Lab 12/16/20 2012  AST 17  ALT 12  ALKPHOS 190*  BILITOT 0.7  PROT 6.8  ALBUMIN 3.8   Radiological Exams on Admission: CT Angio Chest PE  W and/or Wo Contrast  Result Date: 12/16/2020 CLINICAL DATA:  PE suspected, high prob Left flank pain. Recent diagnosis of breast cancer with bony metastasis. EXAM: CT ANGIOGRAPHY CHEST WITH CONTRAST TECHNIQUE: Multidetector CT imaging of the chest was performed using the standard protocol during bolus administration of intravenous contrast. Multiplanar CT image reconstructions and MIPs were obtained to evaluate the vascular anatomy. CONTRAST:  130mL OMNIPAQUE IOHEXOL 350 MG/ML SOLN COMPARISON:  Chest radiograph earlier today. PET-CT 11/05/2020. Chest CT 10/19/2020 FINDINGS: Cardiovascular: There are no filling defects within the pulmonary arteries to suggest pulmonary embolus. Thoracic aorta is normal in caliber. Conventional branching pattern from the aortic arch. No dissection or acute aortic finding. Normal heart size. Trace pericardial effusion measures up to 7 mm. Mediastinum/Nodes: No enlarged mediastinal, hilar, or axillary nodes. No esophageal wall thickening. No thyroid nodule. Lungs/Pleura: Mild breathing motion artifact which limits assessment, particularly at the lung bases.  No acute airspace disease. Trace right pleural effusion, similar to prior. No pulmonary nodule. Mild central bronchial thickening. Upper Abdomen: Assessed on concurrent abdominal CT, reported separately. Musculoskeletal: Known diffuse osseous metastatic disease with mixed lytic and sclerotic lesions. Unchanged mild compression fractures of T8 and T11. No displaced pathologic fracture. Right breast mass may be slightly smaller largest dimension 2.5 cm, previously 3.3 cm. Review of the MIP images confirms the above findings. IMPRESSION: 1. No pulmonary embolus. 2. Mild central bronchial thickening, can be seen with bronchitis or reactive airways disease. 3. Known diffuse osseous metastatic disease. Unchanged mild compression fractures of T8 and T11. No evidence of acute or pathologic fracture. 4. Right breast mass may be slightly smaller from prior exam. Electronically Signed   By: Keith Rake M.D.   On: 12/16/2020 21:58   CT ABDOMEN PELVIS W CONTRAST  Result Date: 12/16/2020 CLINICAL DATA:  Left flank pain, onset today. EXAM: CT ABDOMEN AND PELVIS WITH CONTRAST TECHNIQUE: Multidetector CT imaging of the abdomen and pelvis was performed using the standard protocol following bolus administration of intravenous contrast. CONTRAST:  130mL OMNIPAQUE IOHEXOL 350 MG/ML SOLN COMPARISON:  PET CT 11/05/2020, abdominopelvic CT 10/19/2020 FINDINGS: Lower chest: Assessed on concurrent chest CT, reported separately. Hepatobiliary: No focal liver abnormality is seen. Status post cholecystectomy. No biliary dilatation. Pancreas: Unremarkable. No pancreatic ductal dilatation or surrounding inflammatory changes. Spleen: Normal in size without focal abnormality. Small splenule is anteriorly. Adrenals/Urinary Tract: Normal adrenal glands. No hydronephrosis. No visualized renal calculi. No perinephric edema. No focal renal abnormality. Urinary bladder is partially distended. Stomach/Bowel: Bowel evaluation is limited in the  absence of enteric contrast. Decompressed stomach. Normal positioning of the duodenum and ligament of Treitz. No small bowel obstruction. Occasional fluid-filled distal small bowel loops without inflammation or wall thickening. Appendectomy with surgical sutures base of the cecum. Short segment colonic wall thickening with mild pericolonic edema involving the proximal descending colon, series 5, image 49. There is also wall thickening in the mid descending colon through the proximal sigmoid with mild pericolonic fat stranding. Occasional distal colonic diverticula but no focal diverticulitis. Vascular/Lymphatic: Normal caliber abdominal aorta. Patent portal and mesenteric veins. No acute vascular findings. No enlarged lymph nodes in the abdomen or pelvis. Reproductive: Bulbous appearance of the uterus with fibroids. Septated left ovarian cyst versus 2 adjacent cysts measuring 4.8 x 2.7 cm, unchanged allowing for differences in caliper placement. Right adnexa is unremarkable. Other: Trace free fluid in the dependent pelvis. No free air or focal fluid collection. Tiny fat containing umbilical hernia. No omental thickening. Musculoskeletal: Known diffuse osseous metastatic  disease throughout the visualized skeleton with mixed lytic and sclerotic lesions. A destructive lesion involving the left iliac bone in anterior iliac crest may contribute to flank pain. No evidence of acute pathologic fracture. Previously questioned right sacral fracture is not definitively seen. IMPRESSION: 1. Findings consistent with colitis involving the descending and proximal sigmoid colon. 2. Known diffuse osseous metastatic disease throughout the visualized skeleton with mixed lytic and sclerotic lesions. A destructive lesion involving the left iliac bone and anterior iliac crest may contribute to flank pain. No evidence of acute pathologic fracture. 3. Septated left ovarian cyst versus 2 adjacent cysts measuring 4.8 x 2.7 cm, unchanged  allowing for differences in caliper placement. 4. Uterine fibroids. Electronically Signed   By: Keith Rake M.D.   On: 12/16/2020 22:07   DG Chest Portable 1 View  Result Date: 12/16/2020 CLINICAL DATA:  Left flank pain, history of metastatic breast cancer EXAM: PORTABLE CHEST 1 VIEW COMPARISON:  Radiograph 10/18/2020 FINDINGS: Low lung volumes and vascular crowding. No consolidative process, pneumothorax or effusion is seen. Stable cardiomediastinal contours accounting for differences in technique and inflation. The aorta is calcified. The remaining cardiomediastinal contours are unremarkable. Degenerative changes are present in the imaged spine and shoulders. Dextrocurvature of the midthoracic levels. Telemetry leads overlie the chest. IMPRESSION: 1. Low lung volumes and atelectasis with vascular crowding. 2. No other acute cardiopulmonary abnormality. Electronically Signed   By: Lovena Le M.D.   On: 12/16/2020 20:17    EKG: Independently reviewed.  Vent. rate 92 BPM PR interval * ms QRS duration 93 ms QT/QTc 346/428 ms P-R-T axes 56 55 26 Sinus rhythm Anterior infarct, old 12 Lead; [place]  Assessment/Plan Principal Problem:   Acute colitis Observation/MedSurg. Continue IV fluids. Analgesics as needed. Antiemetics as needed. Continue Zosyn every 8 hours. Follow-up CBC and CMP.  Active Problems:   Breast cancer metastasized to bone, unspecified laterality ([place]) Continue analgesics as needed.    Hypokalemia Replacing Follow-up potassium level.    Leukocytopenia Secondary to chemotherapy. Monitor WBC.    Class 1 obesity Lifestyle modifications. Follow-up with PCP.    DVT prophylaxis: Lovenox SQ. Code Status:   Full code. Family Communication: Disposition Plan:   Patient is from:  Home.  Anticipated DC to:  Home.  Anticipated DC date:  12/17/2020 12/18/2020.  Anticipated DC barriers: Clinical status.  Consults called: Admission status:   Observation/telemetry.   Severity of Illness: High due to presenting with hypotension then abdominal pain secondary to acute colitis.  The patient will need to remain in the hospital for at least 24 to 48 hours for IV hydration and IV antibiotic therapy.  Reubin Milan MD [facility]  How to contact the [facility] Attending or Consulting provider [place] or covering provider during after hours [place], for this patient?   1. Check the care team in [facility] and look for a) attending/consulting [facility] provider listed and b) the [facility] team listed 2. Log into [email or web address] and use [place] universal password to access. If you do not have the password, please contact the hospital operator. 3. Locate the [facility] provider you are looking for under [facility] and page to a number that you can be directly reached. 4. If you still have difficulty reaching the provider, please page the [facility] (Director on Call) for the Hospitalists listed on amion for assistance.  12/16/2020, 11:25 PM   This document was prepared using Dragon voice recognition software and may contain some unintended transcription errors.

## 2020-12-17 ENCOUNTER — Encounter (HOSPITAL_COMMUNITY): Payer: Self-pay | Admitting: Internal Medicine

## 2020-12-17 ENCOUNTER — Other Ambulatory Visit: Payer: Self-pay

## 2020-12-17 DIAGNOSIS — K529 Noninfective gastroenteritis and colitis, unspecified: Secondary | ICD-10-CM | POA: Diagnosis present

## 2020-12-17 DIAGNOSIS — A0472 Enterocolitis due to Clostridium difficile, not specified as recurrent: Secondary | ICD-10-CM | POA: Diagnosis present

## 2020-12-17 DIAGNOSIS — D630 Anemia in neoplastic disease: Secondary | ICD-10-CM | POA: Diagnosis present

## 2020-12-17 DIAGNOSIS — R109 Unspecified abdominal pain: Secondary | ICD-10-CM | POA: Diagnosis not present

## 2020-12-17 DIAGNOSIS — C50919 Malignant neoplasm of unspecified site of unspecified female breast: Secondary | ICD-10-CM | POA: Diagnosis not present

## 2020-12-17 DIAGNOSIS — Z20822 Contact with and (suspected) exposure to covid-19: Secondary | ICD-10-CM | POA: Diagnosis present

## 2020-12-17 DIAGNOSIS — R933 Abnormal findings on diagnostic imaging of other parts of digestive tract: Secondary | ICD-10-CM | POA: Diagnosis not present

## 2020-12-17 DIAGNOSIS — D701 Agranulocytosis secondary to cancer chemotherapy: Secondary | ICD-10-CM | POA: Diagnosis not present

## 2020-12-17 DIAGNOSIS — E876 Hypokalemia: Secondary | ICD-10-CM | POA: Diagnosis present

## 2020-12-17 DIAGNOSIS — Z79899 Other long term (current) drug therapy: Secondary | ICD-10-CM | POA: Diagnosis not present

## 2020-12-17 DIAGNOSIS — I10 Essential (primary) hypertension: Secondary | ICD-10-CM | POA: Diagnosis present

## 2020-12-17 DIAGNOSIS — I959 Hypotension, unspecified: Secondary | ICD-10-CM | POA: Diagnosis present

## 2020-12-17 DIAGNOSIS — C7951 Secondary malignant neoplasm of bone: Secondary | ICD-10-CM | POA: Diagnosis present

## 2020-12-17 DIAGNOSIS — T451X5A Adverse effect of antineoplastic and immunosuppressive drugs, initial encounter: Secondary | ICD-10-CM | POA: Diagnosis present

## 2020-12-17 DIAGNOSIS — D72819 Decreased white blood cell count, unspecified: Secondary | ICD-10-CM

## 2020-12-17 DIAGNOSIS — Z803 Family history of malignant neoplasm of breast: Secondary | ICD-10-CM | POA: Diagnosis not present

## 2020-12-17 DIAGNOSIS — Z8249 Family history of ischemic heart disease and other diseases of the circulatory system: Secondary | ICD-10-CM | POA: Diagnosis not present

## 2020-12-17 DIAGNOSIS — G893 Neoplasm related pain (acute) (chronic): Secondary | ICD-10-CM | POA: Diagnosis present

## 2020-12-17 DIAGNOSIS — Z6831 Body mass index (BMI) 31.0-31.9, adult: Secondary | ICD-10-CM | POA: Diagnosis not present

## 2020-12-17 DIAGNOSIS — C50911 Malignant neoplasm of unspecified site of right female breast: Secondary | ICD-10-CM | POA: Diagnosis present

## 2020-12-17 LAB — C DIFFICILE QUICK SCREEN W PCR REFLEX
C Diff antigen: POSITIVE — AB
C Diff toxin: NEGATIVE

## 2020-12-17 LAB — CLOSTRIDIUM DIFFICILE BY PCR, REFLEXED: Toxigenic C. Difficile by PCR: POSITIVE — AB

## 2020-12-17 LAB — RESP PANEL BY RT-PCR (FLU A&B, COVID) ARPGX2
Influenza A by PCR: NEGATIVE
Influenza B by PCR: NEGATIVE
SARS Coronavirus 2 by RT PCR: NEGATIVE

## 2020-12-17 LAB — TROPONIN I (HIGH SENSITIVITY): Troponin I (High Sensitivity): <2 ng/L (ref <18)

## 2020-12-17 MED ORDER — ONDANSETRON HCL 4 MG/2ML IJ SOLN
4.0000 mg | Freq: Once | INTRAMUSCULAR | Status: AC
  Administered 2020-12-17: 4 mg via INTRAVENOUS
  Filled 2020-12-17: qty 2

## 2020-12-17 MED ORDER — ONDANSETRON HCL 4 MG/2ML IJ SOLN
INTRAMUSCULAR | Status: AC
  Filled 2020-12-17: qty 2

## 2020-12-17 MED ORDER — DRONABINOL 5 MG PO CAPS: 5.0000 mg | ORAL_CAPSULE | Freq: Two times a day (BID) | ORAL | Status: DC

## 2020-12-17 MED ORDER — METAXALONE 400 MG HALF TABLET: 400.0000 mg | ORAL_TABLET | Freq: Three times a day (TID) | ORAL | Status: DC

## 2020-12-17 MED ORDER — HYDROMORPHONE HCL 1 MG/ML IJ SOLN
1.0000 mg | INTRAMUSCULAR | Status: DC | PRN
  Administered 2020-12-17 (×2): 1 mg via INTRAVENOUS
  Filled 2020-12-17 (×2): qty 1

## 2020-12-17 MED ORDER — METAXALONE 800 MG PO TABS
400.0000 mg | ORAL_TABLET | Freq: Three times a day (TID) | ORAL | Status: DC
  Administered 2020-12-17 – 2020-12-21 (×13): 400 mg via ORAL
  Filled 2020-12-17 (×2): qty 0.5
  Filled 2020-12-17: qty 1
  Filled 2020-12-17 (×2): qty 0.5
  Filled 2020-12-17: qty 1
  Filled 2020-12-17 (×6): qty 0.5
  Filled 2020-12-17: qty 1
  Filled 2020-12-17: qty 0.5

## 2020-12-17 MED ORDER — CALCIUM CARBONATE ANTACID 500 MG PO CHEW
1.0000 | CHEWABLE_TABLET | Freq: Once | ORAL | Status: AC
  Administered 2020-12-17: 200 mg via ORAL
  Filled 2020-12-17: qty 1

## 2020-12-17 MED ORDER — MORPHINE SULFATE (PF) 2 MG/ML IV SOLN
1.0000 mg | Freq: Once | INTRAVENOUS | Status: AC
  Administered 2020-12-17: 1 mg via INTRAVENOUS
  Filled 2020-12-17: qty 1

## 2020-12-17 MED ORDER — FENTANYL 25 MCG/HR TD PT72
1.0000 | MEDICATED_PATCH | TRANSDERMAL | Status: DC
  Administered 2020-12-17 – 2020-12-20 (×2): 1 via TRANSDERMAL
  Filled 2020-12-17 (×2): qty 1

## 2020-12-17 MED ORDER — BOOST / RESOURCE BREEZE PO LIQD CUSTOM
1.0000 | Freq: Three times a day (TID) | ORAL | Status: DC
  Administered 2020-12-17 – 2020-12-21 (×8): 1 via ORAL

## 2020-12-17 MED ORDER — PIPERACILLIN-TAZOBACTAM 3.375 G IVPB
3.3750 g | Freq: Three times a day (TID) | INTRAVENOUS | Status: DC
  Administered 2020-12-17 – 2020-12-18 (×4): 3.375 g via INTRAVENOUS
  Filled 2020-12-17 (×5): qty 50

## 2020-12-17 MED ORDER — LORAZEPAM 0.5 MG PO TABS: 0.5000 mg | ORAL_TABLET | Freq: Three times a day (TID) | ORAL | Status: DC | PRN

## 2020-12-17 MED ORDER — HYDROMORPHONE HCL 1 MG/ML IJ SOLN
1.0000 mg | INTRAMUSCULAR | Status: DC | PRN
  Administered 2020-12-19 – 2020-12-21 (×7): 1 mg via INTRAVENOUS
  Filled 2020-12-17 (×8): qty 1

## 2020-12-17 NOTE — Progress Notes (Addendum)
[facility] PROGRESS NOTE   Annette James YPP:[id number] DOB: [birth year] DOA: 12/16/2020  PCP: [name], MD  Brief History/Interval Summary: 60 y.o. 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  She also mention nausea and vomiting.  CT scan raise concern for acute colitis.  She was hospitalized for further management.  \  Consultants: None yet  Procedures: None  Antibiotics: Anti-infectives (From admission, onward)   Start     Dose/Rate Route Frequency Ordered Stop   12/17/20 0600  piperacillin-tazobactam (ZOSYN) IVPB 3.375 g        3.375 g 12.5 mL/hr over 240 Minutes Intravenous Every 8 hours 12/17/20 0401     12/16/20 2230  piperacillin-tazobactam (ZOSYN) IVPB 3.375 g        3.375 g 100 mL/hr over 30 Minutes Intravenous  Once 12/16/20 2218 12/17/20 0005      Subjective/Interval History: Patient mentions that she is nauseated this morning and had one episode of vomiting.  Continues to have pain in the left side of the abdomen and flank looks better than last night.  Also had about 4-5 episodes of loose stools overnight.  Denies any blood in the stool.     Assessment/Plan:  Acute colitis/nausea and vomiting/abdominal pain Patient reported multiple episodes of loose stools overnight.  CT scan does show colitis.  She is noted to be afebrile.  Leukopenic.  Due to her immunosuppressed status she is at high risk for infection.  Empirically started on Zosyn.  We will also order stool studies including C. difficile.  Enteric precautions for now.   Her abdominal pain could be due to the colitis.  However she also has pain higher up in the left abdomen toward the rib cage.  Patient does have lytic lesions in her ribs based on recent bone survey study as well as PET  scan.  This is the most likely reason for her symptoms.  Pain control.  History of breast cancer with metastases to skeletal system/cancer related pain Patient with multiple lesions in her skeletal system based on recent imaging studies.  Followed by Dr. Marin Olp with medical oncology.  Currently on hydromorphone every 4 hours as needed.  May need to increase the frequency.  May need to consider long-acting medication depending on pain control.  Home medication list does show that the patient is supposed to be on Duragesic patch.  This will be resumed.  Hypokalemia This is being repleted.  We will check magnesium level.  Leukopenia Likely due to cancer treatment.  Monitor WBC.  Obesity Estimated body mass index is 31.93 kg/m as calculated from the following:   Height as of this encounter: 5\' 8"  (1.727 m).   Weight as of this encounter: 95.3 kg.   DVT Prophylaxis: Lovenox Code Status: Full code Family Communication: Discussed with the patient Disposition Plan: Hopefully return home when improved  Status is: Observation  The patient will require care spanning > 2 midnights and should be moved to inpatient because: Ongoing active pain requiring inpatient pain management and IV treatments appropriate due to intensity of illness or inability to take PO  Dispo: The patient is from: Home              Anticipated d/c is to: Home  Patient currently is not medically stable to d/c.   Difficult to place patient No      Medications:  Scheduled: . enoxaparin (LOVENOX) injection  40 mg Subcutaneous Q24H  . ondansetron       Continuous: . 0.9 % NaCl with KCl 20 mEq / L 125 mL/hr at 12/17/20 0045  . piperacillin-tazobactam (ZOSYN)  IV 3.375 g (12/17/20 0558)   TIR:[id number] **OR** acetaminophen, HYDROmorphone (DILAUDID) injection, ondansetron **OR** ondansetron (ZOFRAN) IV   Objective:  Vital Signs  Vitals:   12/16/20 2115 12/16/20 2215 12/17/20 0022 12/17/20 0444   BP: 140/80 131/86 133/88 127/70  Pulse: 89 98 82 81  Resp: 18 18    Temp:   98.1 F (36.7 C) 97.9 F (36.6 C)  TempSrc:   Oral Oral  SpO2: 93% 96% 95% 95%  Weight:      Height:        Intake/Output Summary (Last 24 hours) at 12/17/2020 1056 Last data filed at 12/17/2020 0600 Gross per 24 hour  Intake 601.85 ml  Output --  Net 601.85 ml   Filed Weights   12/16/20 1811  Weight: 95.3 kg    General appearance: Awake alert.  In no distress Resp: Clear to auscultation bilaterally.  Normal effort Cardio: S1-S2 is normal regular.  No S3-S4.  No rubs murmurs or bruit GI: Abdomen is soft.  Tenderness in the left upper abdomen towards the rib cage area.  No masses organomegaly.  Bowel sounds present.  Extremities: No edema.  Full range of motion of lower extremities. Neurologic: Alert and oriented x3.  No focal neurological deficits.    Lab Results:  Data Reviewed: I have personally reviewed following labs and imaging studies  CBC: Recent Labs  Lab 12/16/20 2012  WBC 3.7*  NEUTROABS 3.0  HGB 12.3  HCT 37.8  MCV 85.9  PLT 950    Basic Metabolic Panel: Recent Labs  Lab 12/16/20 2012  NA 139  K 3.2*  CL 106  CO2 21*  GLUCOSE 127*  BUN 10  CREATININE 0.54  CALCIUM 8.7*    GFR: Estimated Creatinine Clearance: 93.7 mL/min (by C-G formula based on SCr of 0.54 mg/dL).  Liver Function Tests: Recent Labs  Lab 12/16/20 2012  AST 17  ALT 12  ALKPHOS 190*  BILITOT 0.7  PROT 6.8  ALBUMIN 3.8    Recent Labs  Lab 12/16/20 2012  LIPASE 25     Recent Results (from the past 240 hour(s))  Resp Panel by RT-PCR (Flu A&B, Covid) Nasopharyngeal Swab     Status: None   Collection Time: 12/16/20 11:30 PM   Specimen: Nasopharyngeal Swab; Nasopharyngeal(NP) swabs in vial transport medium  Result Value Ref Range Status   SARS Coronavirus 2 by RT PCR NEGATIVE NEGATIVE Final    Comment: (NOTE) SARS-CoV-2 target nucleic acids are NOT DETECTED.  The SARS-CoV-2 RNA is  generally detectable in upper respiratory specimens during the acute phase of infection. The lowest concentration of SARS-CoV-2 viral copies this assay can detect is 138 copies/mL. A negative result does not preclude SARS-Cov-2 infection and should not be used as the sole basis for treatment or other patient management decisions. A negative result may occur with  improper specimen collection/handling, submission of specimen other than nasopharyngeal swab, presence of viral mutation(s) within the areas targeted by this assay, and inadequate number of viral copies(<138 copies/mL). A negative result must be combined with clinical observations, patient history, and epidemiological information. The expected result is Negative.  Fact Sheet  for Patients:  EntrepreneurPulse.com.au  Fact Sheet for Healthcare Providers:  IncredibleEmployment.be  This test is no t yet approved or cleared by the [place] FDA and  has been authorized for detection and/or diagnosis of SARS-CoV-2 by FDA under an Emergency Use Authorization (EUA). This EUA will remain  in effect (meaning this test can be used) for the duration of the COVID-19 declaration under Section 564(b)(1) of the Act, 21 U.S.C.section 360bbb-3(b)(1), unless the authorization is terminated  or revoked sooner.       Influenza A by PCR NEGATIVE NEGATIVE Final   Influenza B by PCR NEGATIVE NEGATIVE Final    Comment: (NOTE) The Xpert Xpress SARS-CoV-2/FLU/RSV plus assay is intended as an aid in the diagnosis of influenza from Nasopharyngeal swab specimens and should not be used as a sole basis for treatment. Nasal washings and aspirates are unacceptable for Xpert Xpress SARS-CoV-2/FLU/RSV testing.  Fact Sheet for Patients: EntrepreneurPulse.com.au  Fact Sheet for Healthcare Providers: IncredibleEmployment.be  This test is not yet approved or cleared by the [place] FDA and has been authorized for detection and/or diagnosis of SARS-CoV-2 by FDA under an Emergency Use Authorization (EUA). This EUA will remain in effect (meaning this test can be used) for the duration of the COVID-19 declaration under Section 564(b)(1) of the Act, 21 U.S.C. section 360bbb-3(b)(1), unless the authorization is terminated or revoked.  Performed at [facility], [place] [address] [place], [place] [address]       Radiology Studies: CT Angio Chest PE W and/or Wo Contrast  Result Date: 12/16/2020 CLINICAL DATA:  PE suspected, high prob Left flank pain. Recent diagnosis of breast cancer with bony metastasis. EXAM: CT ANGIOGRAPHY CHEST WITH CONTRAST TECHNIQUE: Multidetector CT imaging of the chest was performed using the standard protocol during bolus administration of intravenous contrast. Multiplanar CT image reconstructions and MIPs were obtained to evaluate the vascular anatomy. CONTRAST:  159mL OMNIPAQUE IOHEXOL 350 MG/ML SOLN COMPARISON:  Chest radiograph earlier today. PET-CT 11/05/2020. Chest CT 10/19/2020 FINDINGS: Cardiovascular: There are no filling defects within the pulmonary arteries to suggest pulmonary embolus. Thoracic aorta is normal in caliber. Conventional branching pattern from the aortic arch. No dissection or acute aortic finding. Normal heart size. Trace pericardial effusion measures up to 7 mm. Mediastinum/Nodes: No enlarged mediastinal, hilar, or axillary nodes. No esophageal wall thickening. No thyroid nodule. Lungs/Pleura: Mild breathing motion artifact which limits assessment, particularly at the lung bases. No acute airspace disease. Trace right pleural effusion, similar to prior. No pulmonary nodule. Mild central bronchial thickening. Upper Abdomen: Assessed on concurrent abdominal CT, reported separately. Musculoskeletal: Known diffuse osseous metastatic disease with mixed lytic and sclerotic lesions. Unchanged mild compression  fractures of T8 and T11. No displaced pathologic fracture. Right breast mass may be slightly smaller largest dimension 2.5 cm, previously 3.3 cm. Review of the MIP images confirms the above findings. IMPRESSION: 1. No pulmonary embolus. 2. Mild central bronchial thickening, can be seen with bronchitis or reactive airways disease. 3. Known diffuse osseous metastatic disease. Unchanged mild compression fractures of T8 and T11. No evidence of acute or pathologic fracture. 4. Right breast mass may be slightly smaller from prior exam. Electronically Signed   By: Keith Rake M.D.   On: 12/16/2020 21:58   CT ABDOMEN PELVIS W CONTRAST  Result Date: 12/16/2020 CLINICAL DATA:  Left flank pain, onset today. EXAM: CT ABDOMEN AND PELVIS WITH CONTRAST TECHNIQUE: Multidetector CT imaging of the abdomen and pelvis was performed using the standard protocol following bolus  administration of intravenous contrast. CONTRAST:  112mL OMNIPAQUE IOHEXOL 350 MG/ML SOLN COMPARISON:  PET CT 11/05/2020, abdominopelvic CT 10/19/2020 FINDINGS: Lower chest: Assessed on concurrent chest CT, reported separately. Hepatobiliary: No focal liver abnormality is seen. Status post cholecystectomy. No biliary dilatation. Pancreas: Unremarkable. No pancreatic ductal dilatation or surrounding inflammatory changes. Spleen: Normal in size without focal abnormality. Small splenule is anteriorly. Adrenals/Urinary Tract: Normal adrenal glands. No hydronephrosis. No visualized renal calculi. No perinephric edema. No focal renal abnormality. Urinary bladder is partially distended. Stomach/Bowel: Bowel evaluation is limited in the absence of enteric contrast. Decompressed stomach. Normal positioning of the duodenum and ligament of Treitz. No small bowel obstruction. Occasional fluid-filled distal small bowel loops without inflammation or wall thickening. Appendectomy with surgical sutures base of the cecum. Short segment colonic wall thickening with mild  pericolonic edema involving the proximal descending colon, series 5, image 49. There is also wall thickening in the mid descending colon through the proximal sigmoid with mild pericolonic fat stranding. Occasional distal colonic diverticula but no focal diverticulitis. Vascular/Lymphatic: Normal caliber abdominal aorta. Patent portal and mesenteric veins. No acute vascular findings. No enlarged lymph nodes in the abdomen or pelvis. Reproductive: Bulbous appearance of the uterus with fibroids. Septated left ovarian cyst versus 2 adjacent cysts measuring 4.8 x 2.7 cm, unchanged allowing for differences in caliper placement. Right adnexa is unremarkable. Other: Trace free fluid in the dependent pelvis. No free air or focal fluid collection. Tiny fat containing umbilical hernia. No omental thickening. Musculoskeletal: Known diffuse osseous metastatic disease throughout the visualized skeleton with mixed lytic and sclerotic lesions. A destructive lesion involving the left iliac bone in anterior iliac crest may contribute to flank pain. No evidence of acute pathologic fracture. Previously questioned right sacral fracture is not definitively seen. IMPRESSION: 1. Findings consistent with colitis involving the descending and proximal sigmoid colon. 2. Known diffuse osseous metastatic disease throughout the visualized skeleton with mixed lytic and sclerotic lesions. A destructive lesion involving the left iliac bone and anterior iliac crest may contribute to flank pain. No evidence of acute pathologic fracture. 3. Septated left ovarian cyst versus 2 adjacent cysts measuring 4.8 x 2.7 cm, unchanged allowing for differences in caliper placement. 4. Uterine fibroids. Electronically Signed   By: Keith Rake M.D.   On: 12/16/2020 22:07   DG Chest Portable 1 View  Result Date: 12/16/2020 CLINICAL DATA:  Left flank pain, history of metastatic breast cancer EXAM: PORTABLE CHEST 1 VIEW COMPARISON:  Radiograph 10/18/2020  FINDINGS: Low lung volumes and vascular crowding. No consolidative process, pneumothorax or effusion is seen. Stable cardiomediastinal contours accounting for differences in technique and inflation. The aorta is calcified. The remaining cardiomediastinal contours are unremarkable. Degenerative changes are present in the imaged spine and shoulders. Dextrocurvature of the midthoracic levels. Telemetry leads overlie the chest. IMPRESSION: 1. Low lung volumes and atelectasis with vascular crowding. 2. No other acute cardiopulmonary abnormality. Electronically Signed   By: Lovena Le M.D.   On: 12/16/2020 20:17       LOS: 0 days   [facility] Pager on [email or web address]  12/17/2020, 10:56 AM

## 2020-12-17 NOTE — Progress Notes (Signed)
Initial Nutrition Assessment  DOCUMENTATION CODES:   Obesity unspecified  INTERVENTION:   -Boost Breeze po TID, each supplement provides 250 kcal and 9 grams of protein  NUTRITION DIAGNOSIS:   Inadequate oral intake related to  (colitis) as evidenced by per patient/family report.  GOAL:   Patient will meet greater than or equal to 90% of their needs  MONITOR:   Diet advancement,Labs,Weight trends,I & O's,Supplement acceptance  REASON FOR ASSESSMENT:   Malnutrition Screening Tool    ASSESSMENT:   60 y.o. 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  She also mention nausea and vomiting.  CT scan raise concern for acute colitis.  Patient in room, receiving patient care.  Unable to gather history at this time.  Per chart review, pt has been undergoing chemotherapy for metastatic breast cancer. Last treatment was recently. Pt has been having N/V and diarrhea as well PTA. Was seen in February by [facility] on 2/14, at that time was not eating well d/t poor appetite and similar symptoms.   Will order Boost Breeze supplements while on clear liquids.   Per weight records, pt has lost 30 lbs since 1/7 (12% wt loss x 2 months, significant for time frame).   Medications: KCl, IV Zofran  Labs reviewed: Low K  NUTRITION - FOCUSED PHYSICAL EXAM:  Unable to complete- will attempt at follow-up  Diet Order:   Diet Order            Diet clear liquid Room service appropriate? Yes; Fluid consistency: Thin  Diet effective now                 EDUCATION NEEDS:   No education needs have been identified at this time  Skin:  Skin Assessment: Reviewed RN Assessment  Last BM:  3/8  Height:   Ht Readings from Last 1 Encounters:  12/16/20 5\' 8"  (1.727 m)     Weight:   Wt Readings from Last 1 Encounters:  12/16/20 95.3 kg   BMI:  Body mass index is 31.93 kg/m.  Estimated Nutritional Needs:   Kcal:  1800-2000  Protein:  75-90g  Fluid:  2L/day   Clayton Bibles, MS, RD, LDN Inpatient Clinical Dietitian Contact information available via Amion

## 2020-12-18 LAB — COMPREHENSIVE METABOLIC PANEL
ALT: 11 U/L (ref 0–44)
AST: 15 U/L (ref 15–41)
Albumin: 3.2 g/dL — ABNORMAL LOW (ref 3.5–5.0)
Alkaline Phosphatase: 163 U/L — ABNORMAL HIGH (ref 38–126)
Anion gap: 9 (ref 5–15)
BUN: 10 mg/dL (ref 6–20)
CO2: 23 mmol/L (ref 22–32)
Calcium: 8.2 mg/dL — ABNORMAL LOW (ref 8.9–10.3)
Chloride: 109 mmol/L (ref 98–111)
Creatinine, Ser: 0.52 mg/dL (ref 0.44–1.00)
GFR, Estimated: >60 mL/min (ref >60)
Glucose, Bld: 98 mg/dL (ref 70–99)
Potassium: 3.5 mmol/L (ref 3.5–5.1)
Sodium: 141 mmol/L (ref 135–145)
Total Bilirubin: 0.7 mg/dL (ref 0.3–1.2)
Total Protein: 5.6 g/dL — ABNORMAL LOW (ref 6.5–8.1)

## 2020-12-18 LAB — GASTROINTESTINAL PANEL BY PCR, STOOL (REPLACES STOOL CULTURE)

## 2020-12-18 LAB — CBC
HCT: 31.3 % — ABNORMAL LOW (ref 36.0–46.0)
Hemoglobin: 10.1 g/dL — ABNORMAL LOW (ref 12.0–15.0)
MCH: 28.5 pg (ref 26.0–34.0)
MCHC: 32.3 g/dL (ref 30.0–36.0)
MCV: 88.4 fL (ref 80.0–100.0)
Platelets: 265 10*3/uL (ref 150–400)
RBC: 3.54 MIL/uL — ABNORMAL LOW (ref 3.87–5.11)
RDW: 26.1 % — ABNORMAL HIGH (ref 11.5–15.5)
WBC: 2.2 10*3/uL — ABNORMAL LOW (ref 4.0–10.5)
nRBC: 0 % (ref 0.0–0.2)

## 2020-12-18 LAB — MAGNESIUM: Magnesium: 2.3 mg/dL (ref 1.7–2.4)

## 2020-12-18 MED ORDER — FIDAXOMICIN 200 MG PO TABS
200.0000 mg | ORAL_TABLET | Freq: Two times a day (BID) | ORAL | Status: DC
  Administered 2020-12-18 – 2020-12-21 (×7): 200 mg via ORAL
  Filled 2020-12-18 (×7): qty 1

## 2020-12-18 MED ORDER — SACCHAROMYCES BOULARDII 250 MG PO CAPS
250.0000 mg | ORAL_CAPSULE | Freq: Two times a day (BID) | ORAL | Status: DC
  Administered 2020-12-18 – 2020-12-21 (×7): 250 mg via ORAL
  Filled 2020-12-18 (×7): qty 1

## 2020-12-18 MED ORDER — AMOXICILLIN-POT CLAVULANATE 875-125 MG PO TABS
1.0000 | ORAL_TABLET | Freq: Two times a day (BID) | ORAL | Status: DC
  Administered 2020-12-18 – 2020-12-20 (×5): 1 via ORAL
  Filled 2020-12-18 (×5): qty 1

## 2020-12-18 NOTE — Progress Notes (Signed)
PROGRESS NOTE    Annette James  ZOX:[id number] DOB: 1960-12-05 DOA: 12/16/2020 PCP: [name], MD   Chief Complaint  Patient presents with  . Flank Pain   Brief Narrative: 60 year old female with significant history of metastatic breast cancer, history of varicella-zoster, class I obesity, hypertension admitted with left-sided abdominal and flank pain, nausea and vomiting. In the ED CT scan was concerning for acute colitis and patient was admitted  Subjective: Seen and examined this morning. Diarrhea improving, tolerating diet. No new complaints As pain some on the left side.  Assessment & Plan:  Acute colitis with nausea vomiting abdominal pain-CT showed colitis in the ED C. difficile positive antigen and PCR positive for toxigenic C. Difficile Overall improving we will continue on Zosyn hopefully switch to oral Augmentin, also starting C. difficile treatment with Dificid x10 days , since patient was symptomatic and high risk.  Will add probiotics.  Advance to soft diet today.  Breast cancer metastasized to bone/Cancer related pain Followed by Dr. Marin Olp continue on opiates for pain control, continue home fentanyl patch.  Pain control with opiates as tolerated.   Hypokalemia: Resolved Leukocytopenia: WBC 2200.  Monitor Morbid obesity with BMI 31.  Dietitian consult weight loss.  Diet Order            DIET SOFT Room service appropriate? Yes; Fluid consistency: Thin  Diet effective now                 Nutrition Problem: Inadequate oral intake Etiology:  (colitis) Signs/Symptoms: per patient/family report Interventions: Boost Breeze Patient's Body mass index is 31.93 kg/m.  DVT prophylaxis: enoxaparin (LOVENOX) injection 40 mg Start: 12/17/20 1000 Code Status:   Code Status: Full Code  Family Communication: plan of care discussed with patient at bedside.  Status is: Inpatient  Remains inpatient appropriate because:Inpatient level of care appropriate due to  severity of illness   Dispo: The patient is from: Home              Anticipated d/c is to: Home once tolerating po and no more diarrhea. likely 1-2 days              Patient currently is not medically stable to d/c.   Difficult to place patient No       Unresulted Labs (From admission, onward)          Start     Ordered   12/23/20 0500  Creatinine, serum  (enoxaparin (LOVENOX)    CrCl >/= 30 ml/min)  Weekly,   R     Comments: while on enoxaparin therapy    12/16/20 2321   12/18/20 0500  CBC  Daily,   R      12/17/20 1106   12/18/20 0500  Comprehensive metabolic panel  Daily,   R      12/17/20 1106   12/17/20 0850  Gastrointestinal Panel by PCR , Stool  (Gastrointestinal Panel by PCR, Stool                                                                                                                                                     **  Does Not include CLOSTRIDIUM DIFFICILE testing. **If CDIFF testing is needed, place order from the "C Difficile Testing" order set.**)  Once,   R        12/17/20 0849   12/16/20 1814  Urinalysis, Routine w reflex microscopic  Once,   STAT        12/16/20 1814         Medications reviewed:  Scheduled Meds: . amoxicillin-clavulanate  1 tablet Oral Q12H  . enoxaparin (LOVENOX) injection  40 mg Subcutaneous Q24H  . feeding supplement  1 Container Oral TID BM  . fentaNYL  1 patch Transdermal Q72H  . fidaxomicin  200 mg Oral BID  . metaxalone  400 mg Oral TID  . saccharomyces boulardii  250 mg Oral BID   Continuous Infusions:  Consultants:see note  Procedures:see note  Antimicrobials: Anti-infectives (From admission, onward)   Start     Dose/Rate Route Frequency Ordered Stop   12/18/20 1400  amoxicillin-clavulanate (AUGMENTIN) 875-125 MG per tablet 1 tablet        1 tablet Oral Every 12 hours 12/18/20 1040     12/18/20 1000  fidaxomicin (DIFICID) tablet 200 mg        200 mg Oral 2 times daily 12/18/20 0925 12/28/20 0959   12/17/20 0600   piperacillin-tazobactam (ZOSYN) IVPB 3.375 g  Status:  Discontinued        3.375 g 12.5 mL/hr over 240 Minutes Intravenous Every 8 hours 12/17/20 0401 12/18/20 1040   12/16/20 2230  piperacillin-tazobactam (ZOSYN) IVPB 3.375 g        3.375 g 100 mL/hr over 30 Minutes Intravenous  Once 12/16/20 2218 12/17/20 0005     Culture/Microbiology No results found for: SDES, SPECREQUEST, CULT, REPTSTATUS  Other culture-see note  Objective: Vitals: Today's Vitals   12/17/20 1637 12/17/20 1914 12/17/20 2109 12/18/20 0519  BP: (!) 142/75  (!) 144/80 (!) 147/89  Pulse: 81  77 75  Resp: 15  19 18   Temp: 98.1 F (36.7 C)   97.9 F (36.6 C)  TempSrc: Oral   Oral  SpO2: 98%  99% 97%  Weight:      Height:      PainSc:  4       Intake/Output Summary (Last 24 hours) at 12/18/2020 1210 Last data filed at 12/17/2020 1810 Gross per 24 hour  Intake 320 ml  Output --  Net 320 ml   Filed Weights   12/16/20 1811  Weight: 95.3 kg   Weight change:   Intake/Output from previous day: 03/08 0701 - 03/09 0700 In: 320 [P.O.:320] Out: -  Intake/Output this shift: No intake/output data recorded. Filed Weights   12/16/20 1811  Weight: 95.3 kg    Examination: General exam: AAx3O ,NAD, weak appearing. HEENT:Oral mucosa moist, Ear/Nose WNL grossly,dentition normal. Respiratory system: bilaterally diminished,no use of accessory muscle, non tender. Cardiovascular system: S1 & S2 +, regular, No JVD. Gastrointestinal system: Abdomen soft,ND, BS+,Mildly tender left abdomen Nervous System:Alert, awake, moving extremities and grossly nonfocal Extremities: No edema, distal peripheral pulses palpable.  Skin: No rashes,no icterus. MSK: Normal muscle bulk,tone, power  Data Reviewed: I have personally reviewed following labs and imaging studies CBC: Recent Labs  Lab 12/16/20 2012 12/18/20 0539  WBC 3.7* 2.2*  NEUTROABS 3.0  --   HGB 12.3 10.1*  HCT 37.8 31.3*  MCV 85.9 88.4  PLT 343 623   Basic  Metabolic Panel: Recent Labs  Lab 12/16/20 2012 12/18/20 0539  NA 139 141  K 3.2* 3.5  CL 106 109  CO2 21* 23  GLUCOSE 127* 98  BUN 10 10  CREATININE 0.54 0.52  CALCIUM 8.7* 8.2*  MG  --  2.3   GFR: Estimated Creatinine Clearance: 93.7 mL/min (by C-G formula based on SCr of 0.52 mg/dL). Liver Function Tests: Recent Labs  Lab 12/16/20 2012 12/18/20 0539  AST 17 15  ALT 12 11  ALKPHOS 190* 163*  BILITOT 0.7 0.7  PROT 6.8 5.6*  ALBUMIN 3.8 3.2*   Recent Labs  Lab 12/16/20 2012  LIPASE 25   No results for input(s): AMMONIA in the last 168 hours. Coagulation Profile: No results for input(s): INR, PROTIME in the last 168 hours. Cardiac Enzymes: No results for input(s): CKTOTAL, CKMB, CKMBINDEX, TROPONINI in the last 168 hours. BNP (last 3 results) No results for input(s): PROBNP in the last 8760 hours. HbA1C: No results for input(s): HGBA1C in the last 72 hours. CBG: No results for input(s): GLUCAP in the last 168 hours. Lipid Profile: No results for input(s): CHOL, HDL, LDLCALC, TRIG, CHOLHDL, LDLDIRECT in the last 72 hours. Thyroid Function Tests: No results for input(s): TSH, T4TOTAL, FREET4, T3FREE, THYROIDAB in the last 72 hours. Anemia Panel: No results for input(s): VITAMINB12, FOLATE, FERRITIN, TIBC, IRON, RETICCTPCT in the last 72 hours. Sepsis Labs: No results for input(s): PROCALCITON, LATICACIDVEN in the last 168 hours.  Recent Results (from the past 240 hour(s))  Resp Panel by RT-PCR (Flu A&B, Covid) Nasopharyngeal Swab     Status: None   Collection Time: 12/16/20 11:30 PM   Specimen: Nasopharyngeal Swab; Nasopharyngeal(NP) swabs in vial transport medium  Result Value Ref Range Status   SARS Coronavirus 2 by RT PCR NEGATIVE NEGATIVE Final    Comment: (NOTE) SARS-CoV-2 target nucleic acids are NOT DETECTED.  The SARS-CoV-2 RNA is generally detectable in upper respiratory specimens during the acute phase of infection. The lowest concentration of  SARS-CoV-2 viral copies this assay can detect is 138 copies/mL. A negative result does not preclude SARS-Cov-2 infection and should not be used as the sole basis for treatment or other patient management decisions. A negative result may occur with  improper specimen collection/handling, submission of specimen other than nasopharyngeal swab, presence of viral mutation(s) within the areas targeted by this assay, and inadequate number of viral copies(<138 copies/mL). A negative result must be combined with clinical observations, patient history, and epidemiological information. The expected result is Negative.  Fact Sheet for Patients:  EntrepreneurPulse.com.au  Fact Sheet for Healthcare Providers:  IncredibleEmployment.be  This test is no t yet approved or cleared by the [place] FDA and  has been authorized for detection and/or diagnosis of SARS-CoV-2 by FDA under an Emergency Use Authorization (EUA). This EUA will remain  in effect (meaning this test can be used) for the duration of the COVID-19 declaration under Section 564(b)(1) of the Act, 21 U.S.C.section 360bbb-3(b)(1), unless the authorization is terminated  or revoked sooner.       Influenza A by PCR NEGATIVE NEGATIVE Final   Influenza B by PCR NEGATIVE NEGATIVE Final    Comment: (NOTE) The Xpert Xpress SARS-CoV-2/FLU/RSV plus assay is intended as an aid in the diagnosis of influenza from Nasopharyngeal swab specimens and should not be used as a sole basis for treatment. Nasal washings and aspirates are unacceptable for Xpert Xpress SARS-CoV-2/FLU/RSV testing.  Fact Sheet for Patients: EntrepreneurPulse.com.au  Fact Sheet for Healthcare Providers: IncredibleEmployment.be  This test is not yet approved or cleared by the [place] and has been authorized  for detection and/or diagnosis of SARS-CoV-2 by FDA under an Emergency Use  Authorization (EUA). This EUA will remain in effect (meaning this test can be used) for the duration of the COVID-19 declaration under Section 564(b)(1) of the Act, 21 U.S.C. section 360bbb-3(b)(1), unless the authorization is terminated or revoked.  Performed at [facility], [place] [address] [place], [place] [address]   C Difficile Quick Screen w PCR reflex     Status: Abnormal   Collection Time: 12/17/20  3:52 PM   Specimen: STOOL  Result Value Ref Range Status   C Diff antigen POSITIVE (A) NEGATIVE Final   C Diff toxin NEGATIVE NEGATIVE Final   C Diff interpretation Results are indeterminate. See PCR results.  Final    Comment: Performed at [facility], [place] [address], [place], [place] [address]  C. Diff by PCR, Reflexed     Status: Abnormal   Collection Time: 12/17/20  3:52 PM  Result Value Ref Range Status   Toxigenic C. Difficile by PCR POSITIVE (A) NEGATIVE Final    Comment: Positive for toxigenic C. difficile with little to no toxin production. Only treat if clinical presentation suggests symptomatic illness. Performed at [facility], [place] [address], [place], [place] [address]      Radiology Studies: CT Angio Chest PE W and/or Wo Contrast  Result Date: 12/16/2020 CLINICAL DATA:  PE suspected, high prob Left flank pain. Recent diagnosis of breast cancer with bony metastasis. EXAM: CT ANGIOGRAPHY CHEST WITH CONTRAST TECHNIQUE: Multidetector CT imaging of the chest was performed using the standard protocol during bolus administration of intravenous contrast. Multiplanar CT image reconstructions and MIPs were obtained to evaluate the vascular anatomy. CONTRAST:  196mL OMNIPAQUE IOHEXOL 350 MG/ML SOLN COMPARISON:  Chest radiograph earlier today. PET-CT 11/05/2020. Chest CT 10/19/2020 FINDINGS: Cardiovascular: There are no filling defects within the pulmonary arteries to suggest pulmonary embolus. Thoracic aorta is normal in caliber.  Conventional branching pattern from the aortic arch. No dissection or acute aortic finding. Normal heart size. Trace pericardial effusion measures up to 7 mm. Mediastinum/Nodes: No enlarged mediastinal, hilar, or axillary nodes. No esophageal wall thickening. No thyroid nodule. Lungs/Pleura: Mild breathing motion artifact which limits assessment, particularly at the lung bases. No acute airspace disease. Trace right pleural effusion, similar to prior. No pulmonary nodule. Mild central bronchial thickening. Upper Abdomen: Assessed on concurrent abdominal CT, reported separately. Musculoskeletal: Known diffuse osseous metastatic disease with mixed lytic and sclerotic lesions. Unchanged mild compression fractures of T8 and T11. No displaced pathologic fracture. Right breast mass may be slightly smaller largest dimension 2.5 cm, previously 3.3 cm. Review of the MIP images confirms the above findings. IMPRESSION: 1. No pulmonary embolus. 2. Mild central bronchial thickening, can be seen with bronchitis or reactive airways disease. 3. Known diffuse osseous metastatic disease. Unchanged mild compression fractures of T8 and T11. No evidence of acute or pathologic fracture. 4. Right breast mass may be slightly smaller from prior exam. Electronically Signed   By: Keith Rake M.D.   On: 12/16/2020 21:58   CT ABDOMEN PELVIS W CONTRAST  Result Date: 12/16/2020 CLINICAL DATA:  Left flank pain, onset today. EXAM: CT ABDOMEN AND PELVIS WITH CONTRAST TECHNIQUE: Multidetector CT imaging of the abdomen and pelvis was performed using the standard protocol following bolus administration of intravenous contrast. CONTRAST:  18mL OMNIPAQUE IOHEXOL 350 MG/ML SOLN COMPARISON:  PET CT 11/05/2020, abdominopelvic CT 10/19/2020 FINDINGS: Lower chest: Assessed on concurrent chest CT, reported separately. Hepatobiliary: No  focal liver abnormality is seen. Status post cholecystectomy. No biliary dilatation. Pancreas: Unremarkable. No  pancreatic ductal dilatation or surrounding inflammatory changes. Spleen: Normal in size without focal abnormality. Small splenule is anteriorly. Adrenals/Urinary Tract: Normal adrenal glands. No hydronephrosis. No visualized renal calculi. No perinephric edema. No focal renal abnormality. Urinary bladder is partially distended. Stomach/Bowel: Bowel evaluation is limited in the absence of enteric contrast. Decompressed stomach. Normal positioning of the duodenum and ligament of Treitz. No small bowel obstruction. Occasional fluid-filled distal small bowel loops without inflammation or wall thickening. Appendectomy with surgical sutures base of the cecum. Short segment colonic wall thickening with mild pericolonic edema involving the proximal descending colon, series 5, image 49. There is also wall thickening in the mid descending colon through the proximal sigmoid with mild pericolonic fat stranding. Occasional distal colonic diverticula but no focal diverticulitis. Vascular/Lymphatic: Normal caliber abdominal aorta. Patent portal and mesenteric veins. No acute vascular findings. No enlarged lymph nodes in the abdomen or pelvis. Reproductive: Bulbous appearance of the uterus with fibroids. Septated left ovarian cyst versus 2 adjacent cysts measuring 4.8 x 2.7 cm, unchanged allowing for differences in caliper placement. Right adnexa is unremarkable. Other: Trace free fluid in the dependent pelvis. No free air or focal fluid collection. Tiny fat containing umbilical hernia. No omental thickening. Musculoskeletal: Known diffuse osseous metastatic disease throughout the visualized skeleton with mixed lytic and sclerotic lesions. A destructive lesion involving the left iliac bone in anterior iliac crest may contribute to flank pain. No evidence of acute pathologic fracture. Previously questioned right sacral fracture is not definitively seen. IMPRESSION: 1. Findings consistent with colitis involving the descending and  proximal sigmoid colon. 2. Known diffuse osseous metastatic disease throughout the visualized skeleton with mixed lytic and sclerotic lesions. A destructive lesion involving the left iliac bone and anterior iliac crest may contribute to flank pain. No evidence of acute pathologic fracture. 3. Septated left ovarian cyst versus 2 adjacent cysts measuring 4.8 x 2.7 cm, unchanged allowing for differences in caliper placement. 4. Uterine fibroids. Electronically Signed   By: Keith Rake M.D.   On: 12/16/2020 22:07   DG Chest Portable 1 View  Result Date: 12/16/2020 CLINICAL DATA:  Left flank pain, history of metastatic breast cancer EXAM: PORTABLE CHEST 1 VIEW COMPARISON:  Radiograph 10/18/2020 FINDINGS: Low lung volumes and vascular crowding. No consolidative process, pneumothorax or effusion is seen. Stable cardiomediastinal contours accounting for differences in technique and inflation. The aorta is calcified. The remaining cardiomediastinal contours are unremarkable. Degenerative changes are present in the imaged spine and shoulders. Dextrocurvature of the midthoracic levels. Telemetry leads overlie the chest. IMPRESSION: 1. Low lung volumes and atelectasis with vascular crowding. 2. No other acute cardiopulmonary abnormality. Electronically Signed   By: Lovena Le M.D.   On: 12/16/2020 20:17     LOS: 1 day   Antonieta Pert, MD [facility]  12/18/2020, 12:10 PM

## 2020-12-19 LAB — CBC
HCT: 32.8 % — ABNORMAL LOW (ref 36.0–46.0)
Hemoglobin: 10.4 g/dL — ABNORMAL LOW (ref 12.0–15.0)
MCH: 28.2 pg (ref 26.0–34.0)
MCHC: 31.7 g/dL (ref 30.0–36.0)
MCV: 88.9 fL (ref 80.0–100.0)
Platelets: 259 10*3/uL (ref 150–400)
RBC: 3.69 MIL/uL — ABNORMAL LOW (ref 3.87–5.11)
RDW: 25.8 % — ABNORMAL HIGH (ref 11.5–15.5)
WBC: 1.7 10*3/uL — ABNORMAL LOW (ref 4.0–10.5)
nRBC: 0 % (ref 0.0–0.2)

## 2020-12-19 LAB — COMPREHENSIVE METABOLIC PANEL
ALT: 12 U/L (ref 0–44)
AST: 17 U/L (ref 15–41)
Albumin: 3.1 g/dL — ABNORMAL LOW (ref 3.5–5.0)
Alkaline Phosphatase: 167 U/L — ABNORMAL HIGH (ref 38–126)
Anion gap: 9 (ref 5–15)
BUN: 8 mg/dL (ref 6–20)
CO2: 22 mmol/L (ref 22–32)
Calcium: 8.3 mg/dL — ABNORMAL LOW (ref 8.9–10.3)
Chloride: 109 mmol/L (ref 98–111)
Creatinine, Ser: 0.51 mg/dL (ref 0.44–1.00)
GFR, Estimated: >60 mL/min (ref >60)
Glucose, Bld: 96 mg/dL (ref 70–99)
Potassium: 3.3 mmol/L — ABNORMAL LOW (ref 3.5–5.1)
Sodium: 140 mmol/L (ref 135–145)
Total Bilirubin: 0.8 mg/dL (ref 0.3–1.2)
Total Protein: 5.5 g/dL — ABNORMAL LOW (ref 6.5–8.1)

## 2020-12-19 MED ORDER — POTASSIUM CHLORIDE CRYS ER 20 MEQ PO TBCR
40.0000 meq | EXTENDED_RELEASE_TABLET | Freq: Once | ORAL | Status: AC
  Administered 2020-12-19: 40 meq via ORAL
  Filled 2020-12-19: qty 2

## 2020-12-19 MED ORDER — CALCIUM CARBONATE ANTACID 500 MG PO CHEW
400.0000 mg | CHEWABLE_TABLET | Freq: Once | ORAL | Status: AC
  Administered 2020-12-19: 400 mg via ORAL
  Filled 2020-12-19: qty 2

## 2020-12-19 NOTE — Progress Notes (Signed)
PROGRESS NOTE    Annette James  BZJ:[id number] DOB: 12-04-1960 DOA: 12/16/2020 PCP: [name], MD   Chief Complaint  Patient presents with  . Flank Pain   Brief Narrative: 60 year old female with significant history of metastatic breast cancer, history of varicella-zoster, class I obesity, hypertension admitted with left-sided abdominal and flank pain, nausea and vomiting. In the ED CT scan was concerning for acute colitis and patient was admitted  Subjective: Seen and examined.  Reports did not have a good night last night has abdominal pain in the left side and abdominal fullness.   Last BM was yesterday  Assessment & Plan:  Acute colitis with nausea vomiting abdominal pain-CT showed colitis in the ED C. difficile positive antigen and PCR positive for toxigenic C. Difficile Diarrhea better.  Tolerating diet but has poor abdominal fullness and abdominal pain.  Continue on Aug. mentin and  Cont Dificid total x10 days .  Continue probiotic and diet as tolerated.  Encourage ambulation.  Breast cancer metastasized to bone/Cancer related pain Followed by Dr. Marin Olp.  Pain is controlled continue current regimen, fentanyl patch.   Hypokalemia: Potassium at 3.3 will replete again.    Leukocytopenia: WBC 2200? 1700. Monitor.  Morbid obesity with BMI 31: Augment nutritional status.  Dietitian on board.    Diet Order            DIET SOFT Room service appropriate? Yes; Fluid consistency: Thin  Diet effective now                 Nutrition Problem: Inadequate oral intake Etiology:  (colitis) Signs/Symptoms: per patient/family report Interventions: Boost Breeze Patient's Body mass index is 31.93 kg/m.  DVT prophylaxis: enoxaparin (LOVENOX) injection 40 mg Start: 12/17/20 1000 Code Status:   Code Status: Full Code  Family Communication: plan of care discussed with patient at bedside.  Status is: Inpatient  Remains inpatient appropriate because:Inpatient level of care  appropriate due to severity of illness  Dispo: The patient is from: Home              Anticipated d/c is to: Home once tolerating po and no more diarrhea. likely next 1 to 2 days.              Patient currently is not medically stable to d/c.   Difficult to place patient No   Unresulted Labs (From admission, onward)          Start     Ordered   12/23/20 0500  Creatinine, serum  (enoxaparin (LOVENOX)    CrCl >/= 30 ml/min)  Weekly,   R     Comments: while on enoxaparin therapy    12/16/20 2321   12/18/20 0500  CBC  Daily,   R      12/17/20 1106   12/18/20 0500  Comprehensive metabolic panel  Daily,   R      12/17/20 1106   12/16/20 1814  Urinalysis, Routine w reflex microscopic Urine, Random  Once,   STAT        12/16/20 1814         Medications reviewed:  Scheduled Meds: . amoxicillin-clavulanate  1 tablet Oral Q12H  . enoxaparin (LOVENOX) injection  40 mg Subcutaneous Q24H  . feeding supplement  1 Container Oral TID BM  . fentaNYL  1 patch Transdermal Q72H  . fidaxomicin  200 mg Oral BID  . metaxalone  400 mg Oral TID  . saccharomyces boulardii  250 mg Oral BID   Continuous  Infusions:  Consultants:see note  Procedures:see note  Antimicrobials: Anti-infectives (From admission, onward)   Start     Dose/Rate Route Frequency Ordered Stop   12/18/20 1400  amoxicillin-clavulanate (AUGMENTIN) 875-125 MG per tablet 1 tablet        1 tablet Oral Every 12 hours 12/18/20 1040     12/18/20 1000  fidaxomicin (DIFICID) tablet 200 mg        200 mg Oral 2 times daily 12/18/20 0925 12/28/20 0959   12/17/20 0600  piperacillin-tazobactam (ZOSYN) IVPB 3.375 g  Status:  Discontinued        3.375 g 12.5 mL/hr over 240 Minutes Intravenous Every 8 hours 12/17/20 0401 12/18/20 1040   12/16/20 2230  piperacillin-tazobactam (ZOSYN) IVPB 3.375 g        3.375 g 100 mL/hr over 30 Minutes Intravenous  Once 12/16/20 2218 12/17/20 0005     Culture/Microbiology No results found for: SDES,  SPECREQUEST, CULT, REPTSTATUS  Other culture-see note  Objective: Vitals: Today's Vitals   12/19/20 0433 12/19/20 0604 12/19/20 0946 12/19/20 1016  BP:  (!) 145/86    Pulse:  81    Resp:  20    Temp:  97.7 F (36.5 C)    TempSrc:  Oral    SpO2:  94%    Weight:      Height:      PainSc: 4   7  4      Intake/Output Summary (Last 24 hours) at 12/19/2020 1145 Last data filed at 12/19/2020 0900 Gross per 24 hour  Intake 480 ml  Output --  Net 480 ml   Filed Weights   12/16/20 1811  Weight: 95.3 kg   Weight change:   Intake/Output from previous day: 03/09 0701 - 03/10 0700 In: 480 [P.O.:480] Out: -  Intake/Output this shift: Total I/O In: 240 [P.O.:240] Out: -  Filed Weights   12/16/20 1811  Weight: 95.3 kg    Examination:  General exam: AAOx3, old for age,NAD, weak appearing. HEENT:Oral mucosa moist, Ear/Nose WNL grossly, dentition normal. Respiratory system: bilaterally diminishedd,no wheezing or crackles,no use of accessory muscle Cardiovascular system: S1 & S2 +, No JVD,. Gastrointestinal system: Abdomen soft, mild tenderness on the left abdomen,ND, BS+ Nervous System:Alert, awake, moving extremities and grossly nonfocal Extremities: No edema, distal peripheral pulses palpable.  Skin: No rashes,no icterus. MSK: Normal muscle bulk,tone, power.  Data Reviewed: I have personally reviewed following labs and imaging studies CBC: Recent Labs  Lab 12/16/20 2012 12/18/20 0539 12/19/20 0551  WBC 3.7* 2.2* 1.7*  NEUTROABS 3.0  --   --   HGB 12.3 10.1* 10.4*  HCT 37.8 31.3* 32.8*  MCV 85.9 88.4 88.9  PLT 343 265 829   Basic Metabolic Panel: Recent Labs  Lab 12/16/20 2012 12/18/20 0539 12/19/20 0551  NA 139 141 140  K 3.2* 3.5 3.3*  CL 106 109 109  CO2 21* 23 22  GLUCOSE 127* 98 96  BUN 10 10 8   CREATININE 0.54 0.52 0.51  CALCIUM 8.7* 8.2* 8.3*  MG  --  2.3  --    GFR: Estimated Creatinine Clearance: 93.7 mL/min (by C-G formula based on SCr of  0.51 mg/dL). Liver Function Tests: Recent Labs  Lab 12/16/20 2012 12/18/20 0539 12/19/20 0551  AST 17 15 17   ALT 12 11 12   ALKPHOS 190* 163* 167*  BILITOT 0.7 0.7 0.8  PROT 6.8 5.6* 5.5*  ALBUMIN 3.8 3.2* 3.1*   Recent Labs  Lab 12/16/20 2012  LIPASE 25  No results for input(s): AMMONIA in the last 168 hours. Coagulation Profile: No results for input(s): INR, PROTIME in the last 168 hours. Cardiac Enzymes: No results for input(s): CKTOTAL, CKMB, CKMBINDEX, TROPONINI in the last 168 hours. BNP (last 3 results) No results for input(s): PROBNP in the last 8760 hours. HbA1C: No results for input(s): HGBA1C in the last 72 hours. CBG: No results for input(s): GLUCAP in the last 168 hours. Lipid Profile: No results for input(s): CHOL, HDL, LDLCALC, TRIG, CHOLHDL, LDLDIRECT in the last 72 hours. Thyroid Function Tests: No results for input(s): TSH, T4TOTAL, FREET4, T3FREE, THYROIDAB in the last 72 hours. Anemia Panel: No results for input(s): VITAMINB12, FOLATE, FERRITIN, TIBC, IRON, RETICCTPCT in the last 72 hours. Sepsis Labs: No results for input(s): PROCALCITON, LATICACIDVEN in the last 168 hours.  Recent Results (from the past 240 hour(s))  Resp Panel by RT-PCR (Flu A&B, Covid) Nasopharyngeal Swab     Status: None   Collection Time: 12/16/20 11:30 PM   Specimen: Nasopharyngeal Swab; Nasopharyngeal(NP) swabs in vial transport medium  Result Value Ref Range Status   SARS Coronavirus 2 by RT PCR NEGATIVE NEGATIVE Final    Comment: (NOTE) SARS-CoV-2 target nucleic acids are NOT DETECTED.  The SARS-CoV-2 RNA is generally detectable in upper respiratory specimens during the acute phase of infection. The lowest concentration of SARS-CoV-2 viral copies this assay can detect is 138 copies/mL. A negative result does not preclude SARS-Cov-2 infection and should not be used as the sole basis for treatment or other patient management decisions. A negative result may occur with   improper specimen collection/handling, submission of specimen other than nasopharyngeal swab, presence of viral mutation(s) within the areas targeted by this assay, and inadequate number of viral copies(<138 copies/mL). A negative result must be combined with clinical observations, patient history, and epidemiological information. The expected result is Negative.  Fact Sheet for Patients:  EntrepreneurPulse.com.au  Fact Sheet for Healthcare Providers:  IncredibleEmployment.be  This test is no t yet approved or cleared by the [place] FDA and  has been authorized for detection and/or diagnosis of SARS-CoV-2 by FDA under an Emergency Use Authorization (EUA). This EUA will remain  in effect (meaning this test can be used) for the duration of the COVID-19 declaration under Section 564(b)(1) of the Act, 21 U.S.C.section 360bbb-3(b)(1), unless the authorization is terminated  or revoked sooner.       Influenza A by PCR NEGATIVE NEGATIVE Final   Influenza B by PCR NEGATIVE NEGATIVE Final    Comment: (NOTE) The Xpert Xpress SARS-CoV-2/FLU/RSV plus assay is intended as an aid in the diagnosis of influenza from Nasopharyngeal swab specimens and should not be used as a sole basis for treatment. Nasal washings and aspirates are unacceptable for Xpert Xpress SARS-CoV-2/FLU/RSV testing.  Fact Sheet for Patients: EntrepreneurPulse.com.au  Fact Sheet for Healthcare Providers: IncredibleEmployment.be  This test is not yet approved or cleared by the [place] FDA and has been authorized for detection and/or diagnosis of SARS-CoV-2 by FDA under an Emergency Use Authorization (EUA). This EUA will remain in effect (meaning this test can be used) for the duration of the COVID-19 declaration under Section 564(b)(1) of the Act, 21 U.S.C. section 360bbb-3(b)(1), unless the authorization is terminated  or revoked.  Performed at [facility], [place] [address], [place], [place] [address]   C Difficile Quick Screen w PCR reflex     Status: Abnormal   Collection Time: 12/17/20  3:52 PM   Specimen: STOOL  Result  Value Ref Range Status   C Diff antigen POSITIVE (A) NEGATIVE Final   C Diff toxin NEGATIVE NEGATIVE Final   C Diff interpretation Results are indeterminate. See PCR results.  Final    Comment: Performed at [facility], [place] [address], [place], [place] [address]  Gastrointestinal Panel by PCR , Stool     Status: None   Collection Time: 12/17/20  3:52 PM   Specimen: Stool  Result Value Ref Range Status   Campylobacter species NOT DETECTED NOT DETECTED Final   Plesimonas shigelloides NOT DETECTED NOT DETECTED Final   Salmonella species NOT DETECTED NOT DETECTED Final   Yersinia enterocolitica NOT DETECTED NOT DETECTED Final   Vibrio species NOT DETECTED NOT DETECTED Final   Vibrio cholerae NOT DETECTED NOT DETECTED Final   Enteroaggregative E coli (EAEC) NOT DETECTED NOT DETECTED Final   Enteropathogenic E coli (EPEC) NOT DETECTED NOT DETECTED Final   Enterotoxigenic E coli (ETEC) NOT DETECTED NOT DETECTED Final   Shiga like toxin producing E coli (STEC) NOT DETECTED NOT DETECTED Final   Shigella/Enteroinvasive E coli (EIEC) NOT DETECTED NOT DETECTED Final   Cryptosporidium NOT DETECTED NOT DETECTED Final   Cyclospora cayetanensis NOT DETECTED NOT DETECTED Final   Entamoeba histolytica NOT DETECTED NOT DETECTED Final   Giardia lamblia NOT DETECTED NOT DETECTED Final   Adenovirus F40/41 NOT DETECTED NOT DETECTED Final   Astrovirus NOT DETECTED NOT DETECTED Final   Norovirus GI/GII NOT DETECTED NOT DETECTED Final   Rotavirus A NOT DETECTED NOT DETECTED Final   Sapovirus (I, II, IV, and V) NOT DETECTED NOT DETECTED Final    Comment: Performed at [facility], [place] [place], [place] [address]  C. Diff by PCR, Reflexed      Status: Abnormal   Collection Time: 12/17/20  3:52 PM  Result Value Ref Range Status   Toxigenic C. Difficile by PCR POSITIVE (A) NEGATIVE Final    Comment: Positive for toxigenic C. difficile with little to no toxin production. Only treat if clinical presentation suggests symptomatic illness. Performed at [facility], [place] [address], [place], [place] [address]      Radiology Studies: No results found.   LOS: 2 days   Antonieta Pert, MD [facility]  12/19/2020, 11:45 AM

## 2020-12-20 DIAGNOSIS — R933 Abnormal findings on diagnostic imaging of other parts of digestive tract: Secondary | ICD-10-CM

## 2020-12-20 DIAGNOSIS — K529 Noninfective gastroenteritis and colitis, unspecified: Secondary | ICD-10-CM

## 2020-12-20 DIAGNOSIS — R109 Unspecified abdominal pain: Secondary | ICD-10-CM | POA: Diagnosis not present

## 2020-12-20 DIAGNOSIS — A0472 Enterocolitis due to Clostridium difficile, not specified as recurrent: Principal | ICD-10-CM

## 2020-12-20 LAB — COMPREHENSIVE METABOLIC PANEL
ALT: 17 U/L (ref 0–44)
AST: 23 U/L (ref 15–41)
Albumin: 3.1 g/dL — ABNORMAL LOW (ref 3.5–5.0)
Alkaline Phosphatase: 211 U/L — ABNORMAL HIGH (ref 38–126)
Anion gap: 8 (ref 5–15)
BUN: 9 mg/dL (ref 6–20)
CO2: 24 mmol/L (ref 22–32)
Calcium: 8.6 mg/dL — ABNORMAL LOW (ref 8.9–10.3)
Chloride: 108 mmol/L (ref 98–111)
Creatinine, Ser: 0.55 mg/dL (ref 0.44–1.00)
GFR, Estimated: >60 mL/min (ref >60)
Glucose, Bld: 103 mg/dL — ABNORMAL HIGH (ref 70–99)
Potassium: 3.5 mmol/L (ref 3.5–5.1)
Sodium: 140 mmol/L (ref 135–145)
Total Bilirubin: 0.6 mg/dL (ref 0.3–1.2)
Total Protein: 5.8 g/dL — ABNORMAL LOW (ref 6.5–8.1)

## 2020-12-20 LAB — CBC
HCT: 32.6 % — ABNORMAL LOW (ref 36.0–46.0)
Hemoglobin: 10.5 g/dL — ABNORMAL LOW (ref 12.0–15.0)
MCH: 28.5 pg (ref 26.0–34.0)
MCHC: 32.2 g/dL (ref 30.0–36.0)
MCV: 88.6 fL (ref 80.0–100.0)
Platelets: 240 10*3/uL (ref 150–400)
RBC: 3.68 MIL/uL — ABNORMAL LOW (ref 3.87–5.11)
RDW: 26.5 % — ABNORMAL HIGH (ref 11.5–15.5)
WBC: 2.1 10*3/uL — ABNORMAL LOW (ref 4.0–10.5)
nRBC: 0 % (ref 0.0–0.2)

## 2020-12-20 MED ORDER — DICYCLOMINE HCL 10 MG PO CAPS
10.0000 mg | ORAL_CAPSULE | Freq: Three times a day (TID) | ORAL | Status: DC | PRN
  Administered 2020-12-20 – 2020-12-21 (×2): 10 mg via ORAL
  Filled 2020-12-20 (×2): qty 1

## 2020-12-20 NOTE — Consult Note (Addendum)
Referring Provider:  [facility]         Primary Care Physician:  [name], MD Primary Gastroenterologist: none            We were asked to see this patient for:  colitis                Attending physician's note   I have taken a history, examined the patient and reviewed the chart. I agree with the Advanced Practitioner's note, impression and recommendations.  60 year old very pleasant female with breast cancer, mets to bone on chemotherapy. C. difficile colitis improving on Dificid, continue to complete 14-day course   DC Augmentin, appears was added on for colitis.  Left-sided colitis likely ischemic colitis in the watershed area around the splenic flexure  Advised patient to maintain adequate hydration with increased fluid intake  Advance diet slowly, small frequent meals Use Bentyl 10 mg before meals and at bedtime as needed  Please call with any questions, GI is available    The patient was provided an opportunity to ask questions and all were answered. The patient agreed with the plan and demonstrated an understanding of the instructions.  Annette James , MD [phone]    ASSESSMENT / PLAN:   # 60 yo female recently diagnosed with breast cancer with bone mets. Undergoing chemotherapy with Dr. Marin Olp.   # C-diff infection. Short segment of left sided colitis on CT scan. Often C-diff results in more diffuse inflammation of the colon> GI pathogen panel negative so no reason to suspect concurrent infectious process. No hypermetabolic activity of colon on PET in late January to raise concern for neoplasm.  --Continue Dificid and probiotics.  --She is still getting Augmentin, doesn't need for it for colitis. . Discussed with Hospitalist, I will discontinue it.   # Postprandial nausea, vomiting, LUQ / left flank pain. Nausea and vomiting could be multifactorial but cause of the postprandial left sided pain is less clear.  Symptoms had nearly resolved on  clears before advancing to soft diet.   # Anemia. Hgb was 11.7 with microcytosis on 11/01/20 ( before breast cancer diagnosis / chemotherapy). Iron studies didn't suggest iron deficiency. Hgb now 10.5 but on chemotherapy now.   #Family history of colon cancer in father at age 63     HPI:                                                                                                                             Chief Complaint: abdominal pain  Annette James is a 60 y.o. female with a history of hypertension , metastatic breast cancer. She  relocated to [place] from [place] about 1 year ago.  In early January of this year she came to the ED for dizziness and vertigo resulting in a fall.  Work-up showed a right breast mass and multiple lytic lesions. She is followed by Dr. Marin Olp, undergoing chemotherapy. Started on Marinol in  February but developed nausea and vomiting so she discontinued it after a few days.   Last Saturday, ~ 6 days ago patient began having mild non-bloody diarrhea nausea, vomiting and postprandial generalized abdominal discomfort with localization to LUQ and left flank.  Patient did not think much of the loose stool was she has had this periodically on chemotherapy.. Her symptoms persisted and she presented to the ED on 12/17/2020.  Labs in ED showed alkaline phosphatase of 163, WBC 2.2, hemoglobin 10.1, platelets 265.  There was concern for pulmonary embolism so CTA of the chest was obtained.  No PE seen.  There was mild bronchial thickening and diffuse osseous metastatic disease with unchanged mild compression fractures of T8 and T11.  CT scan of the abdomen and pelvis with IV contrast only without abnormalities of the pancreas or spleen.  There was a short segment of colon wall thickening with mild pericolonic edema involving the proximal descending colon and proximal sigmoid colon.  Occasional distal colonic diverticula but no diverticulitis.    Patient was admitted for  treatment.  She was started on Zosyn then transitioned to Augmentin and symptoms improved. C difficile PCR was positive.  She started Dificid 2 days ago. She was tolerating clear liquids but promptly developed recurrent postprandial left sided abdominal pain, nausea and vomiting with advancement to soft diet.     PREVIOUS ENDOSCOPIC EVALUATIONS / PERTINENT STUDIES   Patient reports having had a colonoscopy for diarrhea a few years ago in [place]. She says the colonoscopy was normal and diarrhea resolved.   Past Medical History:  Diagnosis Date  . Breast cancer metastasized to bone, unspecified laterality ([facility]) 11/01/2020  . Chicken pox   . Class 1 obesity 12/16/2020  . Goals of care, counseling/discussion 10/19/2020  . Metastatic cancer to spine ([place]) 10/19/2020    Past Surgical History:  Procedure Laterality Date  . APPENDECTOMY    . CESAREAN SECTION     x 4  . CHOLECYSTECTOMY      Prior to Admission medications   Medication Sig Start Date End Date Taking? Authorizing Provider  amLODipine (NORVASC) 5 MG tablet Take 1 tablet (5 mg total) by mouth daily. 10/21/20  Yes Donnamae Jude, MD  ascorbic acid (VITAMIN C) 500 MG tablet Take 500 mg by mouth daily.   Yes [provider]  fentaNYL (DURAGESIC) 25 MCG/HR Place 1 patch onto the skin every 3 (three) days. 11/01/20  Yes Ennever, Rudell Cobb, MD  LORazepam (ATIVAN) 0.5 MG tablet Take 1 tablet (0.5 mg total) by mouth every 8 (eight) hours as needed for anxiety (nausea). 12/04/20  Yes Volanda Napoleon, MD  metaxalone (SKELAXIN) 400 MG tablet Take 1 tablet (400 mg total) by mouth 3 (three) times daily. 11/11/20  Yes [name], MD  Multiple Vitamins-Minerals (ONE-A-DAY WOMENS 50 PLUS) TABS Take 1 tablet by mouth daily.   Yes [provider]  ondansetron (ZOFRAN) 8 MG tablet Take 1 tablet (8 mg total) by mouth every 8 (eight) hours as needed for nausea or vomiting. 12/04/20  Yes Ennever, Rudell Cobb, MD  palbociclib (IBRANCE) 125 MG tablet  Take 1 tablet (125 mg total) by mouth daily. Take for 21 days on, 7 days off, repeat every 28 days. 11/06/20  Yes Volanda Napoleon, MD  dronabinol (MARINOL) 5 MG capsule Take 1 capsule (5 mg total) by mouth 2 (two) times daily before a meal. 11/19/20   Ennever, Rudell Cobb, MD  pantoprazole sodium (PROTONIX) 40 mg/20 mL PACK Place  20 mLs (40 mg total) into feeding tube 2 (two) times daily. 12/04/20   Volanda Napoleon, MD    Current Facility-Administered Medications  Medication Dose Route Frequency Provider Last Rate Last Admin  . acetaminophen (TYLENOL) tablet 650 mg  650 mg Oral Q6H PRN Reubin Milan, MD       Or  . acetaminophen (TYLENOL) suppository 650 mg  650 mg Rectal Q6H PRN Reubin Milan, MD      . amoxicillin-clavulanate (AUGMENTIN) 875-125 MG per tablet 1 tablet  1 tablet Oral Juel Burrow, MD   1 tablet at 12/20/20 1009  . enoxaparin (LOVENOX) injection 40 mg  40 mg Subcutaneous Q24H Reubin Milan, MD   40 mg at 12/20/20 1011  . feeding supplement (BOOST / RESOURCE BREEZE) liquid 1 Container  1 Container Oral TID BM Bonnielee Haff, MD   1 Container at 12/19/20 2111  . fentaNYL (DURAGESIC) 25 MCG/HR 1 patch  1 patch Transdermal Q72H Bonnielee Haff, MD   1 patch at 12/17/20 1145  . fidaxomicin (DIFICID) tablet 200 mg  200 mg Oral BID Kc, Ramesh, MD   200 mg at 12/20/20 1009  . HYDROmorphone (DILAUDID) injection 1 mg  1 mg Intravenous Q2H PRN Bonnielee Haff, MD   1 mg at 12/20/20 1010  . LORazepam (ATIVAN) tablet 0.5 mg  0.5 mg Oral Q8H PRN Bonnielee Haff, MD      . metaxalone [facility]) tablet 400 mg  400 mg Oral TID Bonnielee Haff, MD   400 mg at 12/20/20 1008  . ondansetron (ZOFRAN) tablet 4 mg  4 mg Oral Q6H PRN Reubin Milan, MD       Or  . ondansetron [facility]) injection 4 mg  4 mg Intravenous Q6H PRN Reubin Milan, MD   4 mg at 12/20/20 1010  . saccharomyces boulardii (FLORASTOR) capsule 250 mg  250 mg Oral BID Kc, Ramesh, MD   250 mg at 12/20/20 1008     Allergies as of 12/16/2020  . (No Known Allergies)    Family History  Problem Relation Age of Onset  . Breast cancer Mother   . Hypertension Mother   . Colon cancer Father   . Hypertension Father   . Alcoholism Brother   . Alcoholism Brother     Social History   Socioeconomic History  . Marital status: Widowed    Spouse name: Not on file  . Number of children: Not on file  . Years of education: Not on file  . Highest education level: Not on file  Occupational History  . Not on file  Tobacco Use  . Smoking status: Never Smoker  . Smokeless tobacco: Never Used  Substance and Sexual Activity  . Alcohol use: Not Currently  . Drug use: Not Currently  . Sexual activity: Not on file  Other Topics Concern  . Not on file  Social History Narrative  . Not on file   Social Determinants of Health   Financial Resource Strain: Not on file  Food Insecurity: Not on file  Transportation Needs: Not on file  Physical Activity: Not on file  Stress: Not on file  Social Connections: Not on file  Intimate Partner Violence: Not on file    Review of Systems: All systems reviewed and negative except where noted in HPI.  OBJECTIVE:    Physical Exam: Vital signs in last 24 hours: Temp:  [97.7 F (36.5 C)-98.1 F (36.7 C)] 98.1 F (36.7 C) (03/11 0523) Pulse Rate:  [  72-81] 72 (03/11 0528) Resp:  [14-16] 16 (03/11 0528) BP: (125-133)/(77-80) 125/77 (03/11 0523) SpO2:  [95 %-97 %] 95 % (03/11 0528) Last BM Date: 12/20/20 General:   Alert  female in NAD Psych:  Pleasant, cooperative. Normal mood and affect. Eyes:  Pupils equal, sclera clear, no icterus.   Conjunctiva pink. Ears:  Normal auditory acuity. Nose:  No deformity, discharge,  or lesions. Neck:  Supple; no masses Lungs:  Clear throughout to auscultation.   No wheezes, crackles, or rhonchi.  Heart:  Regular rate and rhythm;  no lower extremity edema Abdomen:  Soft, non-distended, mild LUQ and localized left flank  tenderness.  BS active, no palp mass   Rectal:  Deferred  Msk:  Symmetrical without gross deformities. . Neurologic:  Alert and  oriented x4;  grossly normal neurologically. Skin:  Intact without significant lesions or rashes.  Filed Weights   12/16/20 1811  Weight: 95.3 kg     Scheduled inpatient medications . amoxicillin-clavulanate  1 tablet Oral Q12H  . enoxaparin (LOVENOX) injection  40 mg Subcutaneous Q24H  . feeding supplement  1 Container Oral TID BM  . fentaNYL  1 patch Transdermal Q72H  . fidaxomicin  200 mg Oral BID  . metaxalone  400 mg Oral TID  . saccharomyces boulardii  250 mg Oral BID      Intake/Output from previous day: 03/10 0701 - 03/11 0700 In: 720 [P.O.:720] Out: -  Intake/Output this shift: No intake/output data recorded.   Lab Results: Recent Labs    12/18/20 0539 12/19/20 0551 12/20/20 0604  WBC 2.2* 1.7* 2.1*  HGB 10.1* 10.4* 10.5*  HCT 31.3* 32.8* 32.6*  PLT 265 259 240   BMET Recent Labs    12/18/20 0539 12/19/20 0551 12/20/20 0604  NA 141 140 140  K 3.5 3.3* 3.5  CL 109 109 108  CO2 23 22 24   GLUCOSE 98 96 103*  BUN 10 8 9   CREATININE 0.52 0.51 0.55  CALCIUM 8.2* 8.3* 8.6*   LFT Recent Labs    12/20/20 0604  PROT 5.8*  ALBUMIN 3.1*  AST 23  ALT 17  ALKPHOS 211*  BILITOT 0.6   PT/INR No results for input(s): LABPROT, INR in the last 72 hours. Hepatitis Panel No results for input(s): HEPBSAG, HCVAB, HEPAIGM, HEPBIGM in the last 72 hours.   . CBC Latest Ref Rng & Units 12/20/2020 12/19/2020 12/18/2020  WBC 4.0 - 10.5 K/uL 2.1(L) 1.7(L) 2.2(L)  Hemoglobin 12.0 - 15.0 g/dL 10.5(L) 10.4(L) 10.1(L)  Hematocrit 36.0 - 46.0 % 32.6(L) 32.8(L) 31.3(L)  Platelets 150 - 400 K/uL 240 259 265    . CMP Latest Ref Rng & Units 12/20/2020 12/19/2020 12/18/2020  Glucose 70 - 99 mg/dL 103(H) 96 98  BUN 6 - 20 mg/dL 9 8 10   Creatinine 0.44 - 1.00 mg/dL 0.55 0.51 0.52  Sodium 135 - 145 mmol/L 140 140 141  Potassium 3.5 - 5.1  mmol/L 3.5 3.3(L) 3.5  Chloride 98 - 111 mmol/L 108 109 109  CO2 22 - 32 mmol/L 24 22 23   Calcium 8.9 - 10.3 mg/dL 8.6(L) 8.3(L) 8.2(L)  Total Protein 6.5 - 8.1 g/dL 5.8(L) 5.5(L) 5.6(L)  Total Bilirubin 0.3 - 1.2 mg/dL 0.6 0.8 0.7  Alkaline Phos 38 - 126 U/L 211(H) 167(H) 163(H)  AST 15 - 41 U/L 23 17 15   ALT 0 - 44 U/L 17 12 11    Studies/Results: No results found.  Principal Problem:   Acute colitis Active Problems:   Breast  cancer metastasized to bone, unspecified laterality ([place])   Hypokalemia   Leukocytopenia   Class 1 obesity    Tye Savoy, NP-C @  12/20/2020, 11:12 AM

## 2020-12-20 NOTE — Progress Notes (Signed)
PROGRESS NOTE    Annette James  OHY:[id number] DOB: 1961/09/22 DOA: 12/16/2020 PCP: [name], MD   Chief Complaint  Patient presents with  . Flank Pain   Brief Narrative: 60 year old female with significant history of metastatic breast cancer, history of varicella-zoster, class I obesity, hypertension admitted with left-sided abdominal and flank pain, nausea and vomiting. In the ED CT scan was concerning for acute colitis and patient was admitted. Patient CDiff positive in PCR given her iron is placed on Dificid, was on Zosyn and transition to Augmentin  Subjective: Patient reports every time she eats she gets severe abdominal pain.   2 BM yesterday.  Assessment & Plan:  Acute colitis with nausea vomiting abdominal pain-CT showed colitis in the ED C. difficile positive antigen and PCR positive for toxigenic C. Difficile  GI panel negative, diarrhea better but still complains of abdominal pain worse with eating on soft diet.  Changed to full liquid diet, I will get GI opinion consult [facility].  Continue Dificid, Augmentin, probiotic  Breast cancer metastasized to bone/Cancer related pain Followed by Dr. Marin Olp.  Pain is controlled continue current regimen, fentanyl patch.   Hypokalemia: It has resolved.    Leukocytopenia: WBC 2200> 1700>2100.  Improving.  Monitor Anemia likely from chronic disease metastatic cancer.  Stable in 10 g range, although 12 o nadmission Recent Labs  Lab 12/16/20 2012 12/18/20 0539 12/19/20 0551 12/20/20 0604  HGB 12.3 10.1* 10.4* 10.5*  HCT 37.8 31.3* 32.8* 32.6*   Morbid obesity with BMI 31: Augment nutritional status,Dietitian on board.    Diet Order            Diet full liquid Room service appropriate? Yes; Fluid consistency: Thin  Diet effective now                 Nutrition Problem: Inadequate oral intake Etiology:  (colitis) Signs/Symptoms: per patient/family report Interventions: Boost Breeze Patient's Body  mass index is 31.93 kg/m.  DVT prophylaxis: enoxaparin (LOVENOX) injection 40 mg Start: 12/17/20 1000 Code Status:   Code Status: Full Code  Family Communication: plan of care discussed with patient at bedside.  Status is: Inpatient  Remains inpatient appropriate because:Inpatient level of care appropriate due to severity of illness  Dispo: The patient is from: Home              Anticipated d/c is to: Home once tolerating po and once abdominal pain is better.  Hopefully in 1 to 2 days.               Patient currently is not medically stable to d/c.   Difficult to place patient No   Unresulted Labs (From admission, onward)          Start     Ordered   12/23/20 0500  Creatinine, serum  (enoxaparin (LOVENOX)    CrCl >/= 30 ml/min)  Weekly,   R     Comments: while on enoxaparin therapy    12/16/20 2321   12/18/20 0500  CBC  Daily,   R      12/17/20 1106   12/18/20 0500  Comprehensive metabolic panel  Daily,   R      12/17/20 1106   12/16/20 1814  Urinalysis, Routine w reflex microscopic Urine, Random  Once,   STAT        12/16/20 1814         Medications reviewed:  Scheduled Meds: . amoxicillin-clavulanate  1 tablet Oral Q12H  . enoxaparin (LOVENOX)  injection  40 mg Subcutaneous Q24H  . feeding supplement  1 Container Oral TID BM  . fentaNYL  1 patch Transdermal Q72H  . fidaxomicin  200 mg Oral BID  . metaxalone  400 mg Oral TID  . saccharomyces boulardii  250 mg Oral BID   Continuous Infusions:  Consultants:see note  Procedures:see note  Antimicrobials: Anti-infectives (From admission, onward)   Start     Dose/Rate Route Frequency Ordered Stop   12/18/20 1400  amoxicillin-clavulanate (AUGMENTIN) 875-125 MG per tablet 1 tablet        1 tablet Oral Every 12 hours 12/18/20 1040     12/18/20 1000  fidaxomicin (DIFICID) tablet 200 mg        200 mg Oral 2 times daily 12/18/20 0925 12/28/20 0959   12/17/20 0600  piperacillin-tazobactam (ZOSYN) IVPB 3.375 g  Status:   Discontinued        3.375 g 12.5 mL/hr over 240 Minutes Intravenous Every 8 hours 12/17/20 0401 12/18/20 1040   12/16/20 2230  piperacillin-tazobactam (ZOSYN) IVPB 3.375 g        3.375 g 100 mL/hr over 30 Minutes Intravenous  Once 12/16/20 2218 12/17/20 0005     Culture/Microbiology No results found for: SDES, SPECREQUEST, CULT, REPTSTATUS  Other culture-see note  Objective: Vitals: Today's Vitals   12/20/20 0041 12/20/20 0523 12/20/20 0528 12/20/20 0716  BP:  125/77    Pulse:   72   Resp:   16   Temp:  98.1 F (36.7 C)    TempSrc:  Oral    SpO2:   95%   Weight:      Height:      PainSc: 2    3     Intake/Output Summary (Last 24 hours) at 12/20/2020 1007 Last data filed at 12/19/2020 1800 Gross per 24 hour  Intake 480 ml  Output --  Net 480 ml   Filed Weights   12/16/20 1811  Weight: 95.3 kg   Weight change:   Intake/Output from previous day: 03/10 0701 - 03/11 0700 In: 720 [P.O.:720] Out: -  Intake/Output this shift: No intake/output data recorded. Filed Weights   12/16/20 1811  Weight: 95.3 kg    Examination: General exam: AAOx3,NAD, weak appearing. HEENT:Oral mucosa moist, Ear/Nose WNL grossly, dentition normal. Respiratory system: bilaterally diminishedd,no wheezing or crackles,no use of accessory muscle Cardiovascular system: S1 & S2 +, No JVD,. Gastrointestinal system: Abdomen soft, tenderness on the left abdomen bowel sounds present nondistended no rebound or guarding.   Nervous System:Alert, awake, moving extremities and grossly nonfocal Extremities: No edema, distal peripheral pulses palpable.  Skin: No rashes,no icterus. MSK: Normal muscle bulk,tone, power  Data Reviewed: I have personally reviewed following labs and imaging studies CBC: Recent Labs  Lab 12/16/20 2012 12/18/20 0539 12/19/20 0551 12/20/20 0604  WBC 3.7* 2.2* 1.7* 2.1*  NEUTROABS 3.0  --   --   --   HGB 12.3 10.1* 10.4* 10.5*  HCT 37.8 31.3* 32.8* 32.6*  MCV 85.9 88.4  88.9 88.6  PLT 343 265 259 563   Basic Metabolic Panel: Recent Labs  Lab 12/16/20 2012 12/18/20 0539 12/19/20 0551 12/20/20 0604  NA 139 141 140 140  K 3.2* 3.5 3.3* 3.5  CL 106 109 109 108  CO2 21* 23 22 24   GLUCOSE 127* 98 96 103*  BUN 10 10 8 9   CREATININE 0.54 0.52 0.51 0.55  CALCIUM 8.7* 8.2* 8.3* 8.6*  MG  --  2.3  --   --  GFR: Estimated Creatinine Clearance: 93.7 mL/min (by C-G formula based on SCr of 0.55 mg/dL). Liver Function Tests: Recent Labs  Lab 12/16/20 2012 12/18/20 0539 12/19/20 0551 12/20/20 0604  AST 17 15 17 23   ALT 12 11 12 17   ALKPHOS 190* 163* 167* 211*  BILITOT 0.7 0.7 0.8 0.6  PROT 6.8 5.6* 5.5* 5.8*  ALBUMIN 3.8 3.2* 3.1* 3.1*   Recent Labs  Lab 12/16/20 2012  LIPASE 25   No results for input(s): AMMONIA in the last 168 hours. Coagulation Profile: No results for input(s): INR, PROTIME in the last 168 hours. Cardiac Enzymes: No results for input(s): CKTOTAL, CKMB, CKMBINDEX, TROPONINI in the last 168 hours. BNP (last 3 results) No results for input(s): PROBNP in the last 8760 hours. HbA1C: No results for input(s): HGBA1C in the last 72 hours. CBG: No results for input(s): GLUCAP in the last 168 hours. Lipid Profile: No results for input(s): CHOL, HDL, LDLCALC, TRIG, CHOLHDL, LDLDIRECT in the last 72 hours. Thyroid Function Tests: No results for input(s): TSH, T4TOTAL, FREET4, T3FREE, THYROIDAB in the last 72 hours. Anemia Panel: No results for input(s): VITAMINB12, FOLATE, FERRITIN, TIBC, IRON, RETICCTPCT in the last 72 hours. Sepsis Labs: No results for input(s): PROCALCITON, LATICACIDVEN in the last 168 hours.  Recent Results (from the past 240 hour(s))  Resp Panel by RT-PCR (Flu A&B, Covid) Nasopharyngeal Swab     Status: None   Collection Time: 12/16/20 11:30 PM   Specimen: Nasopharyngeal Swab; Nasopharyngeal(NP) swabs in vial transport medium  Result Value Ref Range Status   SARS Coronavirus 2 by RT PCR NEGATIVE  NEGATIVE Final    Comment: (NOTE) SARS-CoV-2 target nucleic acids are NOT DETECTED.  The SARS-CoV-2 RNA is generally detectable in upper respiratory specimens during the acute phase of infection. The lowest concentration of SARS-CoV-2 viral copies this assay can detect is 138 copies/mL. A negative result does not preclude SARS-Cov-2 infection and should not be used as the sole basis for treatment or other patient management decisions. A negative result may occur with  improper specimen collection/handling, submission of specimen other than nasopharyngeal swab, presence of viral mutation(s) within the areas targeted by this assay, and inadequate number of viral copies(<138 copies/mL). A negative result must be combined with clinical observations, patient history, and epidemiological information. The expected result is Negative.  Fact Sheet for Patients:  EntrepreneurPulse.com.au  Fact Sheet for Healthcare Providers:  IncredibleEmployment.be  This test is no t yet approved or cleared by the [place] FDA and  has been authorized for detection and/or diagnosis of SARS-CoV-2 by FDA under an Emergency Use Authorization (EUA). This EUA will remain  in effect (meaning this test can be used) for the duration of the COVID-19 declaration under Section 564(b)(1) of the Act, 21 U.S.C.section 360bbb-3(b)(1), unless the authorization is terminated  or revoked sooner.       Influenza A by PCR NEGATIVE NEGATIVE Final   Influenza B by PCR NEGATIVE NEGATIVE Final    Comment: (NOTE) The Xpert Xpress SARS-CoV-2/FLU/RSV plus assay is intended as an aid in the diagnosis of influenza from Nasopharyngeal swab specimens and should not be used as a sole basis for treatment. Nasal washings and aspirates are unacceptable for Xpert Xpress SARS-CoV-2/FLU/RSV testing.  Fact Sheet for Patients: EntrepreneurPulse.com.au  Fact Sheet for Healthcare  Providers: IncredibleEmployment.be  This test is not yet approved or cleared by the [place] FDA and has been authorized for detection and/or diagnosis of SARS-CoV-2 by FDA under an Emergency Use Authorization (EUA).  This EUA will remain in effect (meaning this test can be used) for the duration of the COVID-19 declaration under Section 564(b)(1) of the Act, 21 U.S.C. section 360bbb-3(b)(1), unless the authorization is terminated or revoked.  Performed at [facility], [place] [address] [place], [place] [address]   C Difficile Quick Screen w PCR reflex     Status: Abnormal   Collection Time: 12/17/20  3:52 PM   Specimen: STOOL  Result Value Ref Range Status   C Diff antigen POSITIVE (A) NEGATIVE Final   C Diff toxin NEGATIVE NEGATIVE Final   C Diff interpretation Results are indeterminate. See PCR results.  Final    Comment: Performed at [facility], [place] [address] [place], [place] [address]  Gastrointestinal Panel by PCR , Stool     Status: None   Collection Time: 12/17/20  3:52 PM   Specimen: Stool  Result Value Ref Range Status   Campylobacter species NOT DETECTED NOT DETECTED Final   Plesimonas shigelloides NOT DETECTED NOT DETECTED Final   Salmonella species NOT DETECTED NOT DETECTED Final   Yersinia enterocolitica NOT DETECTED NOT DETECTED Final   Vibrio species NOT DETECTED NOT DETECTED Final   Vibrio cholerae NOT DETECTED NOT DETECTED Final   Enteroaggregative E coli (EAEC) NOT DETECTED NOT DETECTED Final   Enteropathogenic E coli (EPEC) NOT DETECTED NOT DETECTED Final   Enterotoxigenic E coli (ETEC) NOT DETECTED NOT DETECTED Final   Shiga like toxin producing E coli (STEC) NOT DETECTED NOT DETECTED Final   Shigella/Enteroinvasive E coli (EIEC) NOT DETECTED NOT DETECTED Final   Cryptosporidium NOT DETECTED NOT DETECTED Final   Cyclospora cayetanensis NOT DETECTED NOT DETECTED Final   Entamoeba histolytica NOT  DETECTED NOT DETECTED Final   Giardia lamblia NOT DETECTED NOT DETECTED Final   Adenovirus F40/41 NOT DETECTED NOT DETECTED Final   Astrovirus NOT DETECTED NOT DETECTED Final   Norovirus GI/GII NOT DETECTED NOT DETECTED Final   Rotavirus A NOT DETECTED NOT DETECTED Final   Sapovirus (I, II, IV, and V) NOT DETECTED NOT DETECTED Final    Comment: Performed at [facility] [address] [place] [place], [place] [address]  C. Diff by PCR, Reflexed     Status: Abnormal   Collection Time: 12/17/20  3:52 PM  Result Value Ref Range Status   Toxigenic C. Difficile by PCR POSITIVE (A) NEGATIVE Final    Comment: Positive for toxigenic C. difficile with little to no toxin production. Only treat if clinical presentation suggests symptomatic illness. Performed at [facility], [place] [address], [place], [place] [address]      Radiology Studies: No results found.   LOS: 3 days   Antonieta Pert, MD [facility]  12/20/2020, 10:07 AM

## 2020-12-21 DIAGNOSIS — D701 Agranulocytosis secondary to cancer chemotherapy: Secondary | ICD-10-CM

## 2020-12-21 DIAGNOSIS — A0472 Enterocolitis due to Clostridium difficile, not specified as recurrent: Secondary | ICD-10-CM | POA: Diagnosis not present

## 2020-12-21 DIAGNOSIS — K529 Noninfective gastroenteritis and colitis, unspecified: Secondary | ICD-10-CM | POA: Diagnosis not present

## 2020-12-21 LAB — COMPREHENSIVE METABOLIC PANEL
ALT: 23 U/L (ref 0–44)
AST: 29 U/L (ref 15–41)
Albumin: 3.1 g/dL — ABNORMAL LOW (ref 3.5–5.0)
Alkaline Phosphatase: 252 U/L — ABNORMAL HIGH (ref 38–126)
Anion gap: 10 (ref 5–15)
BUN: 7 mg/dL (ref 6–20)
CO2: 24 mmol/L (ref 22–32)
Calcium: 8.1 mg/dL — ABNORMAL LOW (ref 8.9–10.3)
Chloride: 105 mmol/L (ref 98–111)
Creatinine, Ser: 0.48 mg/dL (ref 0.44–1.00)
GFR, Estimated: >60 mL/min (ref >60)
Glucose, Bld: 98 mg/dL (ref 70–99)
Potassium: 3.4 mmol/L — ABNORMAL LOW (ref 3.5–5.1)
Sodium: 139 mmol/L (ref 135–145)
Total Bilirubin: 0.7 mg/dL (ref 0.3–1.2)
Total Protein: 5.5 g/dL — ABNORMAL LOW (ref 6.5–8.1)

## 2020-12-21 LAB — CBC
HCT: 34.1 % — ABNORMAL LOW (ref 36.0–46.0)
Hemoglobin: 10.7 g/dL — ABNORMAL LOW (ref 12.0–15.0)
MCH: 28.4 pg (ref 26.0–34.0)
MCHC: 31.4 g/dL (ref 30.0–36.0)
MCV: 90.5 fL (ref 80.0–100.0)
Platelets: 238 10*3/uL (ref 150–400)
RBC: 3.77 MIL/uL — ABNORMAL LOW (ref 3.87–5.11)
RDW: 26.3 % — ABNORMAL HIGH (ref 11.5–15.5)
WBC: 1.8 10*3/uL — ABNORMAL LOW (ref 4.0–10.5)
nRBC: 0 % (ref 0.0–0.2)

## 2020-12-21 MED ORDER — METAXALONE 400 MG PO TABS: 400.0000 mg | ORAL_TABLET | Freq: Three times a day (TID) | ORAL | 0 refills | Status: DC

## 2020-12-21 MED ORDER — SACCHAROMYCES BOULARDII 250 MG PO CAPS: 250.0000 mg | ORAL_CAPSULE | Freq: Two times a day (BID) | ORAL | 0 refills | Status: AC

## 2020-12-21 MED ORDER — FIDAXOMICIN 200 MG PO TABS: 200.0000 mg | ORAL_TABLET | Freq: Two times a day (BID) | ORAL | 0 refills | Status: AC

## 2020-12-21 MED ORDER — DICYCLOMINE HCL 10 MG PO CAPS: 10.0000 mg | ORAL_CAPSULE | Freq: Three times a day (TID) | ORAL | 0 refills | Status: DC | PRN

## 2020-12-21 NOTE — Progress Notes (Signed)
[facility] ROUNDING NOTE   Subjective: Feels better. She is tolerating soft diet. She had mild abdominal cramping after dinner but overall is feeling better She continues to be leukopenic    Objective: Vital signs in last 24 hours: Temp:  [97.8 F (36.6 C)-97.9 F (36.6 C)] 97.9 F (36.6 C) (03/12 0521) Pulse Rate:  [69-77] 69 (03/12 0521) Resp:  [16] 16 (03/12 0521) BP: (132-144)/(78-89) 140/89 (03/12 0521) SpO2:  [95 %-97 %] 95 % (03/12 0521) Last BM Date: 12/20/20 General: NAD    Intake/Output from previous day: No intake/output data recorded. Intake/Output this shift: No intake/output data recorded.   Lab Results: Recent Labs    12/19/20 0551 12/20/20 0604 12/21/20 0607  WBC 1.7* 2.1* 1.8*  HGB 10.4* 10.5* 10.7*  PLT 259 240 238  MCV 88.9 88.6 90.5   BMET Recent Labs    12/19/20 0551 12/20/20 0604 12/21/20 0607  NA 140 140 139  K 3.3* 3.5 3.4*  CL 109 108 105  CO2 22 24 24   GLUCOSE 96 103* 98  BUN 8 9 7   CREATININE 0.51 0.55 0.48  CALCIUM 8.3* 8.6* 8.1*   LFT Recent Labs    12/19/20 0551 12/20/20 0604 12/21/20 0607  PROT 5.5* 5.8* 5.5*  ALBUMIN 3.1* 3.1* 3.1*  AST 17 23 29   ALT 12 17 23   ALKPHOS 167* 211* 252*  BILITOT 0.8 0.6 0.7   PT/INR No results for input(s): INR in the last 72 hours.    Imaging/Other results: No results found.    Assessment &Plan  60 yr old very pleasant female with breast Ca, mets to bone admitted with C.diff colitis  Continue Dificid twice daily for total 14 days course.  Will need to consider extended taper 200 mg every other day for additional 21 days if she remains leukopenic and given she is currently undergoing active chemotherapy to prevent recurrent C.diff  Advance diet slowly as needed Continue Bentyl 10mg  q8h PRN  GI will sign off, please call with any questions   This visit required 25 minutes of patient care (this includes precharting, chart review, review of results,  face-to-face time used for counseling as well as treatment plan and follow-up. The patient was provided an opportunity to ask questions and all were answered. The patient agreed with the plan and demonstrated an understanding of the instructions.  Damaris Hippo , MD [phone]     K. Denzil Magnuson , MD [phone]  [facility]

## 2020-12-21 NOTE — Discharge Summary (Signed)
Physician Discharge Summary  Dorella Laster EKC:[id number] DOB: 1960/11/02 DOA: 12/16/2020  PCP: [name], MD  Admit date: 12/16/2020 Discharge date: 12/21/2020  Admitted From: home Disposition:  home  Recommendations for Outpatient Follow-up:  Follow up with PCP -oncology- GI in 1-2 weeks Please obtain CBC in one week Please follow up on the following pending results:  Home Health:no  Equipment/Devices: none  Discharge Condition: Stable Code Status:   Code Status: Full Code Diet recommendation:  Diet Order             DIET SOFT Room service appropriate? Yes; Fluid consistency: Thin  Diet effective now                   Brief/Interim Summary: 60 year old female with significant history of metastatic breast cancer, history of varicella-zoster, class I obesity, hypertension admitted with left-sided abdominal and flank pain, nausea and vomiting. In the ED CT scan was concerning for acute colitis and patient was admitted. Patient CDiff positive in PCR-placed on Dificid Patient was managed medically symptoms started to get better slowly.  Her diarrhea has now resolved.  Patient again having issue with diet tolerance GI was consulted dietary scaled down to liquid diet and was slowly advanced to soft diet.  Also placed on Bentyl with this she had improvement.  Taken off Augmentin as per GI instruction.  She will follow up with GI as outpatient.  At this time she is tolerating diet and she is stable for discharge home.  She does have leukopenia and she will follow up with CBC's next week as she may need to extend her dificid therapy as per GI if continues to have leukopenia.  Discharge Diagnoses:   Acute colitis with nausea vomiting abdominal pain-CT showed colitis in the ED C. difficile positive antigen and PCR positive for toxigenic C. Difficile  GI panel negative, diarrhea has now resolved.  Tolerating diet after to advance slowly.  GI input appreciated.  Continue Dificid for  total 14 days and may need to extend further if still has ongoing leukopenia for which she will follow-up with her doctor to repeat her CBC count.  Cont probiotics.   Breast cancer metastasized to bone/Cancer related pain:Followed by Dr. Marin Olp.  Pain is controlled continue current regimen, fentanyl patch.  She will follow up with her oncologist outpatient   Hypokalemia: resolved.     Leukocytopenia: holding okay-no fever.Monitor cbc In 1 wk Anemia likely from chronic disease metastatic cancer.  Stable in 10 g range, although 12 on admission.  Outpatient CBC check in a week seen by Recent Labs  Lab 12/19/20 0551 12/20/20 0604 12/21/20 0607  HGB 10.4* 10.5* 10.7*  HCT 32.8* 32.6* 34.1*  WBC 1.7* 2.1* 1.8*  PLT 259 240 238    Morbid obesity with BMI 31: Augment nutritional status, seen by dietitian.    Consults: GI  Subjective: Alert awake oriented tolerating diet no more abdominal pain.  Feels strong enough and would like to go home today. Discharge Exam: Vitals:   12/20/20 2014 12/21/20 0521  BP: (!) 144/87 140/89  Pulse: 74 69  Resp: 16 16  Temp: 97.9 F (36.6 C) 97.9 F (36.6 C)  SpO2: 97% 95%   General: Pt is alert, awake, not in acute distress Cardiovascular: RRR, S1/S2 +, no rubs, no gallops Respiratory: CTA bilaterally, no wheezing, no rhonchi Abdominal: Soft, NT, ND, bowel sounds + Extremities: no edema, no cyanosis  Discharge Instructions  Discharge Instructions     Discharge instructions  Complete by: As directed    Please call call MD or return to ER for similar or worsening recurring problem that brought you to hospital or if any fever,nausea/vomiting,abdominal pain, uncontrolled pain, chest pain,  shortness of breath or any other alarming symptoms.  Please follow-up your doctor as instructed in a week time and call the office for appointment.  Please follow-up with your doctor next week to check CBC for low blood cell count  If you continue to have  low blood cell count you may need to extend your C. difficile treatment for 21 days so please follow-up with GI doc/hematology prior to finishing c diff treatment  Please avoid alcohol, smoking, or any other illicit substance and maintain healthy habits including taking your regular medications as prescribed.  You were cared for by a hospitalist during your hospital stay. If you have any questions about your discharge medications or the care you received while you were in the hospital after you are discharged, you can call the unit and ask to speak with the hospitalist on call if the hospitalist that took care of you is not available.  Once you are discharged, your primary care physician will handle any further medical issues. Please note that NO REFILLS for any discharge medications will be authorized once you are discharged, as it is imperative that you return to your primary care physician (or establish a relationship with a primary care physician if you do not have one) for your aftercare needs so that they can reassess your need for medications and monitor your lab values   Increase activity slowly   Complete by: As directed       Allergies as of 12/21/2020   No Known Allergies      Medication List     STOP taking these medications    amLODipine 5 MG tablet Commonly known as: NORVASC       TAKE these medications    ascorbic acid 500 MG tablet Commonly known as: VITAMIN C Take 500 mg by mouth daily.   dicyclomine 10 MG capsule Commonly known as: BENTYL Take 1 capsule (10 mg total) by mouth 3 (three) times daily between meals as needed for up to 30 doses for spasms.   dronabinol 5 MG capsule Commonly known as: MARINOL Take 1 capsule (5 mg total) by mouth 2 (two) times daily before a meal.   fentaNYL 25 MCG/HR Commonly known as: Strum 1 patch onto the skin every 3 (three) days.   fidaxomicin 200 MG Tabs tablet Commonly known as: DIFICID Take 1 tablet (200 mg  total) by mouth 2 (two) times daily for 12 days.   LORazepam 0.5 MG tablet Commonly known as: ATIVAN Take 1 tablet (0.5 mg total) by mouth every 8 (eight) hours as needed for anxiety (nausea).   metaxalone 400 MG tablet Commonly known as: SKELAXIN Take 1 tablet (400 mg total) by mouth 3 (three) times daily for 30 doses.   ondansetron 8 MG tablet Commonly known as: ZOFRAN Take 1 tablet (8 mg total) by mouth every 8 (eight) hours as needed for nausea or vomiting.   One-A-Day Womens 50 Plus Tabs Take 1 tablet by mouth daily.   palbociclib 125 MG tablet Commonly known as: Ibrance Take 1 tablet (125 mg total) by mouth daily. Take for 21 days on, 7 days off, repeat every 28 days.   pantoprazole sodium 40 mg/20 mL Pack Commonly known as: Protonix Place 20 mLs (40 mg total) into feeding tube 2 (  two) times daily.   saccharomyces boulardii 250 MG capsule Commonly known as: FLORASTOR Take 1 capsule (250 mg total) by mouth 2 (two) times daily for 14 days.         No Known Allergies  The results of significant diagnostics from this hospitalization (including imaging, microbiology, ancillary and laboratory) are listed below for reference.    Microbiology: Recent Results (from the past 240 hour(s))  Resp Panel by RT-PCR (Flu A&B, Covid) Nasopharyngeal Swab     Status: None   Collection Time: 12/16/20 11:30 PM   Specimen: Nasopharyngeal Swab; Nasopharyngeal(NP) swabs in vial transport medium  Result Value Ref Range Status   SARS Coronavirus 2 by RT PCR NEGATIVE NEGATIVE Final    Comment: (NOTE) SARS-CoV-2 target nucleic acids are NOT DETECTED.  The SARS-CoV-2 RNA is generally detectable in upper respiratory specimens during the acute phase of infection. The lowest concentration of SARS-CoV-2 viral copies this assay can detect is 138 copies/mL. A negative result does not preclude SARS-Cov-2 infection and should not be used as the sole basis for treatment or other patient management  decisions. A negative result may occur with  improper specimen collection/handling, submission of specimen other than nasopharyngeal swab, presence of viral mutation(s) within the areas targeted by this assay, and inadequate number of viral copies(<138 copies/mL). A negative result must be combined with clinical observations, patient history, and epidemiological information. The expected result is Negative.  Fact Sheet for Patients:  EntrepreneurPulse.com.au  Fact Sheet for Healthcare Providers:  IncredibleEmployment.be  This test is no t yet approved or cleared by the [place] FDA and  has been authorized for detection and/or diagnosis of SARS-CoV-2 by FDA under an Emergency Use Authorization (EUA). This EUA will remain  in effect (meaning this test can be used) for the duration of the COVID-19 declaration under Section 564(b)(1) of the Act, 21 U.S.C.section 360bbb-3(b)(1), unless the authorization is terminated  or revoked sooner.       Influenza A by PCR NEGATIVE NEGATIVE Final   Influenza B by PCR NEGATIVE NEGATIVE Final    Comment: (NOTE) The Xpert Xpress SARS-CoV-2/FLU/RSV plus assay is intended as an aid in the diagnosis of influenza from Nasopharyngeal swab specimens and should not be used as a sole basis for treatment. Nasal washings and aspirates are unacceptable for Xpert Xpress SARS-CoV-2/FLU/RSV testing.  Fact Sheet for Patients: EntrepreneurPulse.com.au  Fact Sheet for Healthcare Providers: IncredibleEmployment.be  This test is not yet approved or cleared by the [place] FDA and has been authorized for detection and/or diagnosis of SARS-CoV-2 by FDA under an Emergency Use Authorization (EUA). This EUA will remain in effect (meaning this test can be used) for the duration of the COVID-19 declaration under Section 564(b)(1) of the Act, 21 U.S.C. section 360bbb-3(b)(1), unless the  authorization is terminated or revoked.  Performed at [facility], [place] [address], [place], [place] [address]   C Difficile Quick Screen w PCR reflex     Status: Abnormal   Collection Time: 12/17/20  3:52 PM   Specimen: STOOL  Result Value Ref Range Status   C Diff antigen POSITIVE (A) NEGATIVE Final   C Diff toxin NEGATIVE NEGATIVE Final   C Diff interpretation Results are indeterminate. See PCR results.  Final    Comment: Performed at [facility], [place] [address] [place], [place] [address]  Gastrointestinal Panel by PCR , Stool     Status: None   Collection Time: 12/17/20  3:52 PM   Specimen:  Stool  Result Value Ref Range Status   Campylobacter species NOT DETECTED NOT DETECTED Final   Plesimonas shigelloides NOT DETECTED NOT DETECTED Final   Salmonella species NOT DETECTED NOT DETECTED Final   Yersinia enterocolitica NOT DETECTED NOT DETECTED Final   Vibrio species NOT DETECTED NOT DETECTED Final   Vibrio cholerae NOT DETECTED NOT DETECTED Final   Enteroaggregative E coli (EAEC) NOT DETECTED NOT DETECTED Final   Enteropathogenic E coli (EPEC) NOT DETECTED NOT DETECTED Final   Enterotoxigenic E coli (ETEC) NOT DETECTED NOT DETECTED Final   Shiga like toxin producing E coli (STEC) NOT DETECTED NOT DETECTED Final   Shigella/Enteroinvasive E coli (EIEC) NOT DETECTED NOT DETECTED Final   Cryptosporidium NOT DETECTED NOT DETECTED Final   Cyclospora cayetanensis NOT DETECTED NOT DETECTED Final   Entamoeba histolytica NOT DETECTED NOT DETECTED Final   Giardia lamblia NOT DETECTED NOT DETECTED Final   Adenovirus F40/41 NOT DETECTED NOT DETECTED Final   Astrovirus NOT DETECTED NOT DETECTED Final   Norovirus GI/GII NOT DETECTED NOT DETECTED Final   Rotavirus A NOT DETECTED NOT DETECTED Final   Sapovirus (I, II, IV, and V) NOT DETECTED NOT DETECTED Final    Comment: Performed at [facility], [address] [place], [place] [address]   C. Diff by PCR, Reflexed     Status: Abnormal   Collection Time: 12/17/20  3:52 PM  Result Value Ref Range Status   Toxigenic C. Difficile by PCR POSITIVE (A) NEGATIVE Final    Comment: Positive for toxigenic C. difficile with little to no toxin production. Only treat if clinical presentation suggests symptomatic illness. Performed at [facility], [place] [address], [place], [place] [address]     Procedures/Studies: CT Angio Chest PE W and/or Wo Contrast  Result Date: 12/16/2020 CLINICAL DATA:  PE suspected, high prob Left flank pain. Recent diagnosis of breast cancer with bony metastasis. EXAM: CT ANGIOGRAPHY CHEST WITH CONTRAST TECHNIQUE: Multidetector CT imaging of the chest was performed using the standard protocol during bolus administration of intravenous contrast. Multiplanar CT image reconstructions and MIPs were obtained to evaluate the vascular anatomy. CONTRAST:  135mL OMNIPAQUE IOHEXOL 350 MG/ML SOLN COMPARISON:  Chest radiograph earlier today. PET-CT 11/05/2020. Chest CT 10/19/2020 FINDINGS: Cardiovascular: There are no filling defects within the pulmonary arteries to suggest pulmonary embolus. Thoracic aorta is normal in caliber. Conventional branching pattern from the aortic arch. No dissection or acute aortic finding. Normal heart size. Trace pericardial effusion measures up to 7 mm. Mediastinum/Nodes: No enlarged mediastinal, hilar, or axillary nodes. No esophageal wall thickening. No thyroid nodule. Lungs/Pleura: Mild breathing motion artifact which limits assessment, particularly at the lung bases. No acute airspace disease. Trace right pleural effusion, similar to prior. No pulmonary nodule. Mild central bronchial thickening. Upper Abdomen: Assessed on concurrent abdominal CT, reported separately. Musculoskeletal: Known diffuse osseous metastatic disease with mixed lytic and sclerotic lesions. Unchanged mild compression fractures of T8 and T11. No displaced pathologic fracture.  Right breast mass may be slightly smaller largest dimension 2.5 cm, previously 3.3 cm. Review of the MIP images confirms the above findings. IMPRESSION: 1. No pulmonary embolus. 2. Mild central bronchial thickening, can be seen with bronchitis or reactive airways disease. 3. Known diffuse osseous metastatic disease. Unchanged mild compression fractures of T8 and T11. No evidence of acute or pathologic fracture. 4. Right breast mass may be slightly smaller from prior exam. Electronically Signed   By: Keith Rake M.D.   On: 12/16/2020 21:58   CT  ABDOMEN PELVIS W CONTRAST  Result Date: 12/16/2020 CLINICAL DATA:  Left flank pain, onset today. EXAM: CT ABDOMEN AND PELVIS WITH CONTRAST TECHNIQUE: Multidetector CT imaging of the abdomen and pelvis was performed using the standard protocol following bolus administration of intravenous contrast. CONTRAST:  176mL OMNIPAQUE IOHEXOL 350 MG/ML SOLN COMPARISON:  PET CT 11/05/2020, abdominopelvic CT 10/19/2020 FINDINGS: Lower chest: Assessed on concurrent chest CT, reported separately. Hepatobiliary: No focal liver abnormality is seen. Status post cholecystectomy. No biliary dilatation. Pancreas: Unremarkable. No pancreatic ductal dilatation or surrounding inflammatory changes. Spleen: Normal in size without focal abnormality. Small splenule is anteriorly. Adrenals/Urinary Tract: Normal adrenal glands. No hydronephrosis. No visualized renal calculi. No perinephric edema. No focal renal abnormality. Urinary bladder is partially distended. Stomach/Bowel: Bowel evaluation is limited in the absence of enteric contrast. Decompressed stomach. Normal positioning of the duodenum and ligament of Treitz. No small bowel obstruction. Occasional fluid-filled distal small bowel loops without inflammation or wall thickening. Appendectomy with surgical sutures base of the cecum. Short segment colonic wall thickening with mild pericolonic edema involving the proximal descending colon,  series 5, image 49. There is also wall thickening in the mid descending colon through the proximal sigmoid with mild pericolonic fat stranding. Occasional distal colonic diverticula but no focal diverticulitis. Vascular/Lymphatic: Normal caliber abdominal aorta. Patent portal and mesenteric veins. No acute vascular findings. No enlarged lymph nodes in the abdomen or pelvis. Reproductive: Bulbous appearance of the uterus with fibroids. Septated left ovarian cyst versus 2 adjacent cysts measuring 4.8 x 2.7 cm, unchanged allowing for differences in caliper placement. Right adnexa is unremarkable. Other: Trace free fluid in the dependent pelvis. No free air or focal fluid collection. Tiny fat containing umbilical hernia. No omental thickening. Musculoskeletal: Known diffuse osseous metastatic disease throughout the visualized skeleton with mixed lytic and sclerotic lesions. A destructive lesion involving the left iliac bone in anterior iliac crest may contribute to flank pain. No evidence of acute pathologic fracture. Previously questioned right sacral fracture is not definitively seen. IMPRESSION: 1. Findings consistent with colitis involving the descending and proximal sigmoid colon. 2. Known diffuse osseous metastatic disease throughout the visualized skeleton with mixed lytic and sclerotic lesions. A destructive lesion involving the left iliac bone and anterior iliac crest may contribute to flank pain. No evidence of acute pathologic fracture. 3. Septated left ovarian cyst versus 2 adjacent cysts measuring 4.8 x 2.7 cm, unchanged allowing for differences in caliper placement. 4. Uterine fibroids. Electronically Signed   By: Keith Rake M.D.   On: 12/16/2020 22:07   DG Chest Portable 1 View  Result Date: 12/16/2020 CLINICAL DATA:  Left flank pain, history of metastatic breast cancer EXAM: PORTABLE CHEST 1 VIEW COMPARISON:  Radiograph 10/18/2020 FINDINGS: Low lung volumes and vascular crowding. No  consolidative process, pneumothorax or effusion is seen. Stable cardiomediastinal contours accounting for differences in technique and inflation. The aorta is calcified. The remaining cardiomediastinal contours are unremarkable. Degenerative changes are present in the imaged spine and shoulders. Dextrocurvature of the midthoracic levels. Telemetry leads overlie the chest. IMPRESSION: 1. Low lung volumes and atelectasis with vascular crowding. 2. No other acute cardiopulmonary abnormality. Electronically Signed   By: Lovena Le M.D.   On: 12/16/2020 20:17    Labs: BNP (last 3 results) No results for input(s): BNP in the last 8760 hours. Basic Metabolic Panel: Recent Labs  Lab 12/16/20 2012 12/18/20 0539 12/19/20 0551 12/20/20 0604 12/21/20 0607  NA 139 141 140 140 139  K 3.2* 3.5 3.3* 3.5 3.4*  CL 106 109 109 108 105  CO2 21* 23 22 24 24   GLUCOSE 127* 98 96 103* 98  BUN 10 10 8 9 7   CREATININE 0.54 0.52 0.51 0.55 0.48  CALCIUM 8.7* 8.2* 8.3* 8.6* 8.1*  MG  --  2.3  --   --   --    Liver Function Tests: Recent Labs  Lab 12/16/20 2012 12/18/20 0539 12/19/20 0551 12/20/20 0604 12/21/20 0607  AST 17 15 17 23 29   ALT 12 11 12 17 23   ALKPHOS 190* 163* 167* 211* 252*  BILITOT 0.7 0.7 0.8 0.6 0.7  PROT 6.8 5.6* 5.5* 5.8* 5.5*  ALBUMIN 3.8 3.2* 3.1* 3.1* 3.1*   Recent Labs  Lab 12/16/20 2012  LIPASE 25   No results for input(s): AMMONIA in the last 168 hours. CBC: Recent Labs  Lab 12/16/20 2012 12/18/20 0539 12/19/20 0551 12/20/20 0604 12/21/20 0607  WBC 3.7* 2.2* 1.7* 2.1* 1.8*  NEUTROABS 3.0  --   --   --   --   HGB 12.3 10.1* 10.4* 10.5* 10.7*  HCT 37.8 31.3* 32.8* 32.6* 34.1*  MCV 85.9 88.4 88.9 88.6 90.5  PLT 343 265 259 240 238   Cardiac Enzymes: No results for input(s): CKTOTAL, CKMB, CKMBINDEX, TROPONINI in the last 168 hours. BNP: Invalid input(s): POCBNP CBG: No results for input(s): GLUCAP in the last 168 hours. D-Dimer No results for input(s):  DDIMER in the last 72 hours. Hgb A1c No results for input(s): HGBA1C in the last 72 hours. Lipid Profile No results for input(s): CHOL, HDL, LDLCALC, TRIG, CHOLHDL, LDLDIRECT in the last 72 hours. Thyroid function studies No results for input(s): TSH, T4TOTAL, T3FREE, THYROIDAB in the last 72 hours.  Invalid input(s): FREET3 Anemia work up No results for input(s): VITAMINB12, FOLATE, FERRITIN, TIBC, IRON, RETICCTPCT in the last 72 hours. Urinalysis    Component Value Date/Time   COLORURINE AMBER (A) 10/18/2020 0819   APPEARANCEUR CLOUDY (A) 10/18/2020 0819   LABSPEC 1.028 10/18/2020 0819   PHURINE 5.0 10/18/2020 0819   GLUCOSEU NEGATIVE 10/18/2020 0819   HGBUR NEGATIVE 10/18/2020 0819   BILIRUBINUR NEGATIVE 10/18/2020 0819   KETONESUR 5 (A) 10/18/2020 0819   PROTEINUR 100 (A) 10/18/2020 0819   NITRITE NEGATIVE 10/18/2020 0819   LEUKOCYTESUR NEGATIVE 10/18/2020 0819   Sepsis Labs Invalid input(s): PROCALCITONIN,  WBC,  LACTICIDVEN Microbiology Recent Results (from the past 240 hour(s))  Resp Panel by RT-PCR (Flu A&B, Covid) Nasopharyngeal Swab     Status: None   Collection Time: 12/16/20 11:30 PM   Specimen: Nasopharyngeal Swab; Nasopharyngeal(NP) swabs in vial transport medium  Result Value Ref Range Status   SARS Coronavirus 2 by RT PCR NEGATIVE NEGATIVE Final    Comment: (NOTE) SARS-CoV-2 target nucleic acids are NOT DETECTED.  The SARS-CoV-2 RNA is generally detectable in upper respiratory specimens during the acute phase of infection. The lowest concentration of SARS-CoV-2 viral copies this assay can detect is 138 copies/mL. A negative result does not preclude SARS-Cov-2 infection and should not be used as the sole basis for treatment or other patient management decisions. A negative result may occur with  improper specimen collection/handling, submission of specimen other than nasopharyngeal swab, presence of viral mutation(s) within the areas targeted by this  assay, and inadequate number of viral copies(<138 copies/mL). A negative result must be combined with clinical observations, patient history, and epidemiological information. The expected result is Negative.  Fact Sheet for Patients:  EntrepreneurPulse.com.au  Fact Sheet for Healthcare Providers:  IncredibleEmployment.be  This test is no t yet approved or cleared by the [place] and  has been authorized for detection and/or diagnosis of SARS-CoV-2 by FDA under an Emergency Use Authorization (EUA). This EUA will remain  in effect (meaning this test can be used) for the duration of the COVID-19 declaration under Section 564(b)(1) of the Act, 21 U.S.C.section 360bbb-3(b)(1), unless the authorization is terminated  or revoked sooner.       Influenza A by PCR NEGATIVE NEGATIVE Final   Influenza B by PCR NEGATIVE NEGATIVE Final    Comment: (NOTE) The Xpert Xpress SARS-CoV-2/FLU/RSV plus assay is intended as an aid in the diagnosis of influenza from Nasopharyngeal swab specimens and should not be used as a sole basis for treatment. Nasal washings and aspirates are unacceptable for Xpert Xpress SARS-CoV-2/FLU/RSV testing.  Fact Sheet for Patients: EntrepreneurPulse.com.au  Fact Sheet for Healthcare Providers: IncredibleEmployment.be  This test is not yet approved or cleared by the [place] FDA and has been authorized for detection and/or diagnosis of SARS-CoV-2 by FDA under an Emergency Use Authorization (EUA). This EUA will remain in effect (meaning this test can be used) for the duration of the COVID-19 declaration under Section 564(b)(1) of the Act, 21 U.S.C. section 360bbb-3(b)(1), unless the authorization is terminated or revoked.  Performed at [facility], [place] [address] [place], [place] [address]   C Difficile Quick Screen w PCR reflex     Status: Abnormal    Collection Time: 12/17/20  3:52 PM   Specimen: STOOL  Result Value Ref Range Status   C Diff antigen POSITIVE (A) NEGATIVE Final   C Diff toxin NEGATIVE NEGATIVE Final   C Diff interpretation Results are indeterminate. See PCR results.  Final    Comment: Performed at [facility], [place] [address], [place], [place] [address]  Gastrointestinal Panel by PCR , Stool     Status: None   Collection Time: 12/17/20  3:52 PM   Specimen: Stool  Result Value Ref Range Status   Campylobacter species NOT DETECTED NOT DETECTED Final   Plesimonas shigelloides NOT DETECTED NOT DETECTED Final   Salmonella species NOT DETECTED NOT DETECTED Final   Yersinia enterocolitica NOT DETECTED NOT DETECTED Final   Vibrio species NOT DETECTED NOT DETECTED Final   Vibrio cholerae NOT DETECTED NOT DETECTED Final   Enteroaggregative E coli (EAEC) NOT DETECTED NOT DETECTED Final   Enteropathogenic E coli (EPEC) NOT DETECTED NOT DETECTED Final   Enterotoxigenic E coli (ETEC) NOT DETECTED NOT DETECTED Final   Shiga like toxin producing E coli (STEC) NOT DETECTED NOT DETECTED Final   Shigella/Enteroinvasive E coli (EIEC) NOT DETECTED NOT DETECTED Final   Cryptosporidium NOT DETECTED NOT DETECTED Final   Cyclospora cayetanensis NOT DETECTED NOT DETECTED Final   Entamoeba histolytica NOT DETECTED NOT DETECTED Final   Giardia lamblia NOT DETECTED NOT DETECTED Final   Adenovirus F40/41 NOT DETECTED NOT DETECTED Final   Astrovirus NOT DETECTED NOT DETECTED Final   Norovirus GI/GII NOT DETECTED NOT DETECTED Final   Rotavirus A NOT DETECTED NOT DETECTED Final   Sapovirus (I, II, IV, and V) NOT DETECTED NOT DETECTED Final    Comment: Performed at [facility], [address] [place],  [address]  C. Diff by PCR, Reflexed     Status: Abnormal   Collection Time: 12/17/20  3:52 PM  Result Value Ref Range Status   Toxigenic C. Difficile by PCR POSITIVE (A) NEGATIVE Final    Comment: Positive  for toxigenic C. difficile with little to no toxin production. Only treat if clinical presentation suggests symptomatic illness. Performed at [facility], [place] [address], [place], [place] [address]      Time coordinating discharge: 25 minutes  SIGNED: Antonieta Pert, MD  [facility] 12/21/2020, 11:40 AM  If 7PM-7AM, please contact night-coverage [email or web address]

## 2020-12-23 ENCOUNTER — Telehealth: Payer: Self-pay

## 2020-12-23 ENCOUNTER — Encounter: Payer: Self-pay | Admitting: *Deleted

## 2020-12-23 NOTE — Telephone Encounter (Signed)
S/w pt per sch message 12/23/20 and she is aware of her hosp f/u appt     Annette James

## 2020-12-23 NOTE — Progress Notes (Signed)
Patient was recently hospitalized with colitis and has been holding her Annette James since last Tuesday. She was discharged on Saturday and told to follow up with [place] this week.   Spoke to Dr Marin Olp and we will bring her in to see Sarah NP this week. Patient is aware. Message sent to scheduling.   Oncology Nurse Navigator Documentation  Oncology Nurse Navigator Flowsheets 12/23/2020  Abnormal Finding Date -  Confirmed Diagnosis Date -  Diagnosis Status -  Planned Course of Treatment -  Phase of Treatment -  Chemotherapy Pending- Reason: -  Chemotherapy Actual Start Date: -  Navigator Follow Up Date: 12/24/2020  Navigator Follow Up Reason: Follow-up Appointment  Navigator Location [facility]  Navigator Encounter Type Telephone  Telephone Incoming Call;Patient Update  Treatment Initiated Date -  Patient Visit Type MedOnc  Treatment Phase Active Tx  Barriers/Navigation Needs Coordination of Care;Education;Morbidities/Frailty  Education Other  Interventions Coordination of Care;Education;Psycho-Social Support  Acuity Level 2-Minimal Needs (1-2 Barriers Identified)  Referrals -  Coordination of Care Appts  Education Method Verbal  Support Groups/Services Friends and Family  Time Spent with Patient 16

## 2020-12-24 ENCOUNTER — Inpatient Hospital Stay (HOSPITAL_BASED_OUTPATIENT_CLINIC_OR_DEPARTMENT_OTHER): Payer: BC Managed Care – PPO | Admitting: Family

## 2020-12-24 ENCOUNTER — Telehealth: Payer: Self-pay

## 2020-12-24 ENCOUNTER — Inpatient Hospital Stay: Payer: BC Managed Care – PPO | Attending: Hematology & Oncology

## 2020-12-24 ENCOUNTER — Other Ambulatory Visit: Payer: Self-pay

## 2020-12-24 ENCOUNTER — Encounter: Payer: Self-pay | Admitting: Family

## 2020-12-24 ENCOUNTER — Inpatient Hospital Stay: Payer: BC Managed Care – PPO

## 2020-12-24 ENCOUNTER — Other Ambulatory Visit (HOSPITAL_BASED_OUTPATIENT_CLINIC_OR_DEPARTMENT_OTHER): Payer: Self-pay | Admitting: Family

## 2020-12-24 ENCOUNTER — Encounter: Payer: Self-pay | Admitting: *Deleted

## 2020-12-24 DIAGNOSIS — C50919 Malignant neoplasm of unspecified site of unspecified female breast: Secondary | ICD-10-CM

## 2020-12-24 DIAGNOSIS — Z5111 Encounter for antineoplastic chemotherapy: Secondary | ICD-10-CM | POA: Diagnosis not present

## 2020-12-24 DIAGNOSIS — Z170 Estrogen receptor positive status [ER+]: Secondary | ICD-10-CM | POA: Insufficient documentation

## 2020-12-24 DIAGNOSIS — C7951 Secondary malignant neoplasm of bone: Secondary | ICD-10-CM | POA: Diagnosis not present

## 2020-12-24 DIAGNOSIS — C50911 Malignant neoplasm of unspecified site of right female breast: Secondary | ICD-10-CM | POA: Diagnosis not present

## 2020-12-24 LAB — CBC WITH DIFFERENTIAL (CANCER CENTER ONLY)
Abs Immature Granulocytes: 0.04 10*3/uL (ref 0.00–0.07)
Basophils Absolute: 0.1 10*3/uL (ref 0.0–0.1)
Basophils Relative: 2 %
Eosinophils Absolute: 0.1 10*3/uL (ref 0.0–0.5)
Eosinophils Relative: 4 %
HCT: 36.2 % (ref 36.0–46.0)
Hemoglobin: 11.8 g/dL — ABNORMAL LOW (ref 12.0–15.0)
Immature Granulocytes: 1 %
Lymphocytes Relative: 22 %
Lymphs Abs: 0.7 10*3/uL (ref 0.7–4.0)
MCH: 28.4 pg (ref 26.0–34.0)
MCHC: 32.6 g/dL (ref 30.0–36.0)
MCV: 87 fL (ref 80.0–100.0)
Monocytes Absolute: 0.3 10*3/uL (ref 0.1–1.0)
Monocytes Relative: 11 %
Neutro Abs: 1.8 10*3/uL (ref 1.7–7.7)
Neutrophils Relative %: 60 %
Platelet Count: 240 10*3/uL (ref 150–400)
RBC: 4.16 MIL/uL (ref 3.87–5.11)
RDW: 26.1 % — ABNORMAL HIGH (ref 11.5–15.5)
WBC Count: 3 10*3/uL — ABNORMAL LOW (ref 4.0–10.5)
nRBC: 0 % (ref 0.0–0.2)

## 2020-12-24 LAB — CMP (CANCER CENTER ONLY)
ALT: 12 U/L (ref 0–44)
AST: 17 U/L (ref 15–41)
Albumin: 4.2 g/dL (ref 3.5–5.0)
Alkaline Phosphatase: 249 U/L — ABNORMAL HIGH (ref 38–126)
Anion gap: 8 (ref 5–15)
BUN: 8 mg/dL (ref 6–20)
CO2: 27 mmol/L (ref 22–32)
Calcium: 9.2 mg/dL (ref 8.9–10.3)
Chloride: 103 mmol/L (ref 98–111)
Creatinine: 0.67 mg/dL (ref 0.44–1.00)
GFR, Estimated: >60 mL/min (ref >60)
Glucose, Bld: 115 mg/dL — ABNORMAL HIGH (ref 70–99)
Potassium: 3.7 mmol/L (ref 3.5–5.1)
Sodium: 138 mmol/L (ref 135–145)
Total Bilirubin: 0.5 mg/dL (ref 0.3–1.2)
Total Protein: 6.3 g/dL — ABNORMAL LOW (ref 6.5–8.1)

## 2020-12-24 LAB — LACTATE DEHYDROGENASE: LDH: 203 U/L — ABNORMAL HIGH (ref 98–192)

## 2020-12-24 MED ORDER — METAXALONE 400 MG PO TABS: 400.0000 mg | ORAL_TABLET | Freq: Three times a day (TID) | ORAL | 0 refills | Status: DC | PRN

## 2020-12-24 MED ORDER — OXYCODONE HCL 5 MG PO CAPS: 5.0000 mg | ORAL_CAPSULE | ORAL | 0 refills | Status: DC | PRN

## 2020-12-24 MED ORDER — FULVESTRANT 250 MG/5ML IM SOLN
INTRAMUSCULAR | Status: AC
  Filled 2020-12-24: qty 10

## 2020-12-24 MED ORDER — FENTANYL 50 MCG/HR TD PT72: 1.0000 | MEDICATED_PATCH | TRANSDERMAL | 0 refills | Status: DC

## 2020-12-24 MED ORDER — FULVESTRANT 250 MG/5ML IM SOLN
500.0000 mg | Freq: Once | INTRAMUSCULAR | Status: AC
  Administered 2020-12-24: 500 mg via INTRAMUSCULAR

## 2020-12-24 NOTE — Progress Notes (Signed)
Hematology and Oncology Follow Up Visit  Annette James [id number] 1961/04/20 60 y.o. 12/24/2020   Principle Diagnosis:  Metastatic breast cancer-bone only metastasis-ER positive/PR positive/HER2 negative  Current Therapy:        Faslodex 500 mg IM monthly-start on 11/06/2020 Ribociclib 600 mg p.o. daily (21 days on/7 days off)-start on 11/06/2020 Zometa 4 mg IV q. 3 months --start on 12/03/2020   Interim History:  Annette James is here today with her daughter for follow-up and treatment. She was hospitalized last week (3/7-3/12) for colitis and C diff. She is now home with her daughter and slowly recuperating.  They stopper her Ibrance during hospital admission.  She has another 9 days left on Dificid. She still has some diarrhea but this seems to be better.  She is having constant pain across her chest in her shoulders and in her back despite the Durgesic patch.  She has completed radiation therapy to the left hip and follows up with Annette James next week.  No fever, chills, n/v, cough, rash, dizziness, palpitations or changes in bladder habits.  No swelling, numbness or tingling in her extremities at this time.  She has mild SOB with over exertion and will take breaks to rest when needed. She has been walking some for exercise to help regain some strength.  She does not have an appetite and her daughter states that she has never been much of an appetite. She is drinking a boost daily. She is doing her best to stay well hydrated. Her weight is stable at 214 lbs.   ECOG Performance Status: 1 - Symptomatic but completely ambulatory  Medications:  Allergies as of 12/24/2020   No Known Allergies     Medication List       Accurate as of December 24, 2020 11:28 AM. If you have any questions, ask your nurse or doctor.        ascorbic acid 500 MG tablet Commonly known as: VITAMIN C Take 500 mg by mouth daily.   dicyclomine 10 MG capsule Commonly known as: BENTYL Take 1 capsule  (10 mg total) by mouth 3 (three) times daily between meals as needed for up to 30 doses for spasms.   dronabinol 5 MG capsule Commonly known as: MARINOL Take 1 capsule (5 mg total) by mouth 2 (two) times daily before a meal.   fentaNYL 25 MCG/HR Commonly known as: Annette James 1 patch onto the skin every 3 (three) days.   fidaxomicin 200 MG Tabs tablet Commonly known as: DIFICID Take 1 tablet (200 mg total) by mouth 2 (two) times daily for 12 days.   LORazepam 0.5 MG tablet Commonly known as: ATIVAN Take 1 tablet (0.5 mg total) by mouth every 8 (eight) hours as needed for anxiety (nausea).   metaxalone 400 MG tablet Commonly known as: SKELAXIN Take 1 tablet (400 mg total) by mouth 3 (three) times daily for 30 doses.   ondansetron 8 MG tablet Commonly known as: ZOFRAN Take 1 tablet (8 mg total) by mouth every 8 (eight) hours as needed for nausea or vomiting.   One-A-Day Womens 50 Plus Tabs Take 1 tablet by mouth daily.   palbociclib 125 MG tablet Commonly known as: Ibrance Take 1 tablet (125 mg total) by mouth daily. Take for 21 days on, 7 days off, repeat every 28 days.   pantoprazole sodium 40 mg/20 mL Pack Commonly known as: Protonix Place 20 mLs (40 mg total) into feeding tube 2 (two) times daily.   saccharomyces boulardii  250 MG capsule Commonly known as: FLORASTOR Take 1 capsule (250 mg total) by mouth 2 (two) times daily for 14 days.       Allergies: No Known Allergies  Past Medical History, Surgical history, Social history, and Family History were reviewed and updated.  Review of Systems: All other 10 point review of systems is negative.   Physical Exam:  vitals were not taken for this visit.   Wt Readings from Last 3 Encounters:  12/16/20 210 lb (95.3 kg)  12/04/20 215 lb (97.5 kg)  11/06/20 227 lb 4 oz (103.1 kg)    Ocular: Sclerae unicteric, pupils equal, round and reactive to light Ear-nose-throat: Oropharynx clear, dentition fair Lymphatic:  No cervical, supraclavicular or axillary adenopathy Lungs no rales or rhonchi, good excursion bilaterally Heart regular rate and rhythm, no murmur appreciated Abd soft, tender still in left flank, positive bowel sounds, no liver or spleen tip palpated on exam, no fluid wave  MSK no focal spinal tenderness, no joint edema Neuro: non-focal, well-oriented, appropriate affect Breasts: Deferred   Lab Results  Component Value Date   WBC 3.0 (L) 12/24/2020   HGB 11.8 (L) 12/24/2020   HCT 36.2 12/24/2020   MCV 87.0 12/24/2020   PLT 240 12/24/2020   Lab Results  Component Value Date   FERRITIN 512 (H) 12/04/2020   IRON 96 12/04/2020   TIBC 244 12/04/2020   UIBC 147 12/04/2020   IRONPCTSAT 40 12/04/2020   Lab Results  Component Value Date   RBC 4.16 12/24/2020   Lab Results  Component Value Date   KAPLAMBRATIO 4.99 10/20/2020   Lab Results  Component Value Date   IGGSERUM 714 10/19/2020   IGA 153 10/19/2020   IGMSERUM 76 10/19/2020   Lab Results  Component Value Date   TOTALPROTELP 6.1 10/19/2020     Chemistry      Component Value Date/Time   NA 139 12/21/2020 0607   K 3.4 (L) 12/21/2020 0607   CL 105 12/21/2020 0607   CO2 24 12/21/2020 0607   BUN 7 12/21/2020 0607   CREATININE 0.48 12/21/2020 0607   CREATININE 0.69 12/04/2020 1203      Component Value Date/Time   CALCIUM 8.1 (L) 12/21/2020 0607   ALKPHOS 252 (H) 12/21/2020 0607   AST 29 12/21/2020 0607   AST 15 12/04/2020 1203   ALT 23 12/21/2020 0607   ALT 9 12/04/2020 1203   BILITOT 0.7 12/21/2020 0607   BILITOT 0.5 12/04/2020 1203       Impression and Plan: Annette James is a very pleasant 60 yo caucasian female with metastatic breast cancer confined at this time to her bones.  CA 27.29 last month was down to 105.  She is slowly recuperating from her bout with colitis and C diff. She was encouraged to increase her food and water intake daily.  She can restart her Ibrance as prescribed.  I spoke with Dr.  Marin James and we will increased her Duragesic patch to 50 mcg for long acting pain control. We will also add Oxycode 5-10 mg PO every 4 hours as needed for breakthrough pain. We discussed side effects and she will contact our office with any questions or concerns.  We will proceed with Faslodex injections today as planned.  Follow-up in 1 month.   Laverna Peace, NP 3/15/202211:28 AM

## 2020-12-24 NOTE — Progress Notes (Signed)
Oncology Nurse Navigator Documentation  Oncology Nurse Navigator Flowsheets 12/24/2020  Abnormal Finding Date -  Confirmed Diagnosis Date -  Diagnosis Status -  Planned Course of Treatment -  Phase of Treatment -  Chemotherapy Pending- Reason: -  Chemotherapy Actual Start Date: -  Navigator Follow Up Date: 01/22/2021  Navigator Follow Up Reason: Follow-up Appointment;Appointment Review  Navigator Comptroller  Navigator Encounter Type Appt/Treatment Plan Review  Telephone -  Treatment Initiated Date -  Patient Visit Type MedOnc  Treatment Phase Active Tx  Barriers/Navigation Needs Coordination of Care;Education;Morbidities/Frailty  Education -  Interventions None Required  Acuity Level 2-Minimal Needs (1-2 Barriers Identified)  Referrals -  Coordination of Care -  Education Method -  Support Groups/Services Friends and Family  Time Spent with Patient 15

## 2020-12-24 NOTE — Progress Notes (Signed)
Per MD- Ok for pt to receive Faslodex today

## 2020-12-24 NOTE — Telephone Encounter (Signed)
Called pt per 12/24/20 los and we r/s her old 01/01/21 appt and r/s to 01/22/21   Annette James

## 2020-12-25 LAB — CANCER ANTIGEN 27.29: CA 27.29: 100.6 U/mL — ABNORMAL HIGH (ref 0.0–38.6)

## 2020-12-29 NOTE — Progress Notes (Signed)
Radiation Oncology         [phone] ________________________________  Name: Annette James MRN: [id number]  Date: 12/30/2020  DOB: 1960-10-26  Follow-Up Visit Note  CC: [name], MD  Volanda Napoleon, MD    ICD-10-CM   1. Breast cancer metastasized to bone, unspecified laterality ([place])  C50.919    C79.51   2. 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 Since her last visit, she was admitted to the hospital from 12/16/2020 to 12/21/2020 for acute colitis with nausea, vomiting, and abdominal pain.  She is on antibiotics for this issue now and is doing better  Recent significant imaging studies include: 1. Chest x-ray on 12/16/2020 that showed low lung volumes and atelectasis with vascular crowding but no other acute cardiopulmonary abnormality. 2. CTA of chest on 12/16/2020 that showed mild central bronchial thickening, which can be seen with bronchitis or reactive airways disease. The known diffuse osseous metastatic disease was also showed. The mild compression fractures of T8 and T11 were unchanged. There was no evidence of acute or pathologic fractures. The right breast mass appeared slightly smaller from prior exam. No pulmonary embolus. 3. CT scan of abdomen and pelvis on 12/16/2020 that showed findings consistent with colitis involving the descending and proximal sigmoid colon. The known diffuse osseous metastatic disease was noted throughout the visualized skeleton with mixed lytic and sclerotic lesions. A destructive lesion involving the left iliac bone  and anterior iliac crest could have been contributing to the patient's flank pain. There was no evidence of acute pathologic fracture. Finally, there were noted to be uterine fibroids and septated left ovarian cyst vs two adjacent cysts measuring 4.8 x 2.7 cm, unchanged allowing for differences in caliper placement.  The patient was last seen by Laverna Peace, oncology NP, on 12/24/2020. She was to restart her Ibrance as prescribed. Her Duragesic patch was increased to 50 mcg for long acting pain control and Oxycodone 5-10 mg PO every four hours as needed was also prescribed for breakthrough pain.   On review of systems, she reports good improvement in her pain along the left pelvic region after her palliative radiation therapy.   ALLERGIES:  has No Known Allergies.  Meds: Current Outpatient Medications  Medication Sig Dispense Refill  . ascorbic acid (VITAMIN C) 500 MG tablet Take 500 mg by mouth daily.    Marland Kitchen dicyclomine (BENTYL) 10 MG capsule Take 1 capsule (10 mg total) by mouth 3 (three) times daily between meals as needed for up to 30 doses for spasms. 30 capsule 0  . fentaNYL (DURAGESIC) 50 MCG/HR Place 1 patch onto the skin every 3 (three) days. 10 patch 0  . fidaxomicin (DIFICID) 200 MG TABS tablet Take 1 tablet (200 mg total) by mouth 2 (two) times daily for 12 days. 24 tablet 0  . LORazepam (ATIVAN) 0.5 MG tablet Take 1 tablet (0.5 mg total) by mouth every 8 (eight) hours as needed for anxiety (nausea). 60 tablet 0  . metaxalone (SKELAXIN) 400 MG tablet Take 1 tablet (400 mg total) by mouth 3 (three)  times daily as needed for up to 30 doses. 30 tablet 0  . Multiple Vitamins-Minerals (ONE-A-DAY WOMENS 50 PLUS) TABS Take 1 tablet by mouth daily.    . ondansetron (ZOFRAN) 8 MG tablet Take 1 tablet (8 mg total) by mouth every 8 (eight) hours as needed for nausea or vomiting. 30 tablet 3  . oxycodone (OXY-IR) 5 MG capsule Take 1-2 capsules (5-10 mg total) by mouth every 4 (four) hours as  needed. 60 capsule 0  . palbociclib (IBRANCE) 125 MG tablet Take 1 tablet (125 mg total) by mouth daily. Take for 21 days on, 7 days off, repeat every 28 days. 21 tablet 4  . pantoprazole sodium (PROTONIX) 40 mg/20 mL PACK Place 20 mLs (40 mg total) into feeding tube 2 (two) times daily. 60 mL 4  . saccharomyces boulardii (FLORASTOR) 250 MG capsule Take 1 capsule (250 mg total) by mouth 2 (two) times daily for 14 days. 28 capsule 0   No current facility-administered medications for this encounter.    Physical Findings: The patient is in no acute distress. Patient is alert and oriented.  height is _0  (1.727 m) and weight is 217 lb 6 oz (98.6 kg). Her temporal temperature is 97 F (36.1 C) (abnormal). Her blood pressure is 135/81 and her pulse is 72. Her respiration is 18 and oxygen saturation is 97%.  No significant changes. Lungs are clear to auscultation bilaterally. Heart has regular rate and rhythm. No palpable cervical, supraclavicular, or axillary adenopathy. Abdomen soft, non-tender, normal bowel sounds.   Lab Findings: Lab Results  Component Value Date   WBC 3.0 (L) 12/24/2020   HGB 11.8 (L) 12/24/2020   HCT 36.2 12/24/2020   MCV 87.0 12/24/2020   PLT 240 12/24/2020    Radiographic Findings: CT Angio Chest PE W and/or Wo Contrast  Result Date: 12/16/2020 CLINICAL DATA:  PE suspected, high prob Left flank pain. Recent diagnosis of breast cancer with bony metastasis. EXAM: CT ANGIOGRAPHY CHEST WITH CONTRAST TECHNIQUE: Multidetector CT imaging of the chest was performed using the standard protocol during bolus administration of intravenous contrast. Multiplanar CT image reconstructions and MIPs were obtained to evaluate the vascular anatomy. CONTRAST:  149m OMNIPAQUE IOHEXOL 350 MG/ML SOLN COMPARISON:  Chest radiograph earlier today. PET-CT 11/05/2020. Chest CT 10/19/2020 FINDINGS: Cardiovascular: There are no filling defects within the pulmonary arteries to suggest pulmonary  embolus. Thoracic aorta is normal in caliber. Conventional branching pattern from the aortic arch. No dissection or acute aortic finding. Normal heart size. Trace pericardial effusion measures up to 7 mm. Mediastinum/Nodes: No enlarged mediastinal, hilar, or axillary nodes. No esophageal wall thickening. No thyroid nodule. Lungs/Pleura: Mild breathing motion artifact which limits assessment, particularly at the lung bases. No acute airspace disease. Trace right pleural effusion, similar to prior. No pulmonary nodule. Mild central bronchial thickening. Upper Abdomen: Assessed on concurrent abdominal CT, reported separately. Musculoskeletal: Known diffuse osseous metastatic disease with mixed lytic and sclerotic lesions. Unchanged mild compression fractures of T8 and T11. No displaced pathologic fracture. Right breast mass may be slightly smaller largest dimension 2.5 cm, previously 3.3 cm. Review of the MIP images confirms the above findings. IMPRESSION: 1. No pulmonary embolus. 2. Mild central bronchial thickening, can be seen with bronchitis or reactive airways disease. 3. Known diffuse osseous metastatic disease. Unchanged mild compression fractures of T8 and T11. No evidence of acute or pathologic fracture. 4. Right breast mass may be slightly smaller from prior exam. Electronically Signed   By: MAurther LoftD.  On: 12/16/2020 21:58   CT ABDOMEN PELVIS W CONTRAST  Result Date: 12/16/2020 CLINICAL DATA:  Left flank pain, onset today. EXAM: CT ABDOMEN AND PELVIS WITH CONTRAST TECHNIQUE: Multidetector CT imaging of the abdomen and pelvis was performed using the standard protocol following bolus administration of intravenous contrast. CONTRAST:  174m OMNIPAQUE IOHEXOL 350 MG/ML SOLN COMPARISON:  PET CT 11/05/2020, abdominopelvic CT 10/19/2020 FINDINGS: Lower chest: Assessed on concurrent chest CT, reported separately. Hepatobiliary: No focal liver abnormality is seen. Status post cholecystectomy. No  biliary dilatation. Pancreas: Unremarkable. No pancreatic ductal dilatation or surrounding inflammatory changes. Spleen: Normal in size without focal abnormality. Small splenule is anteriorly. Adrenals/Urinary Tract: Normal adrenal glands. No hydronephrosis. No visualized renal calculi. No perinephric edema. No focal renal abnormality. Urinary bladder is partially distended. Stomach/Bowel: Bowel evaluation is limited in the absence of enteric contrast. Decompressed stomach. Normal positioning of the duodenum and ligament of Treitz. No small bowel obstruction. Occasional fluid-filled distal small bowel loops without inflammation or wall thickening. Appendectomy with surgical sutures base of the cecum. Short segment colonic wall thickening with mild pericolonic edema involving the proximal descending colon, series 5, image 49. There is also wall thickening in the mid descending colon through the proximal sigmoid with mild pericolonic fat stranding. Occasional distal colonic diverticula but no focal diverticulitis. Vascular/Lymphatic: Normal caliber abdominal aorta. Patent portal and mesenteric veins. No acute vascular findings. No enlarged lymph nodes in the abdomen or pelvis. Reproductive: Bulbous appearance of the uterus with fibroids. Septated left ovarian cyst versus 2 adjacent cysts measuring 4.8 x 2.7 cm, unchanged allowing for differences in caliper placement. Right adnexa is unremarkable. Other: Trace free fluid in the dependent pelvis. No free air or focal fluid collection. Tiny fat containing umbilical hernia. No omental thickening. Musculoskeletal: Known diffuse osseous metastatic disease throughout the visualized skeleton with mixed lytic and sclerotic lesions. A destructive lesion involving the left iliac bone in anterior iliac crest may contribute to flank pain. No evidence of acute pathologic fracture. Previously questioned right sacral fracture is not definitively seen. IMPRESSION: 1. Findings  consistent with colitis involving the descending and proximal sigmoid colon. 2. Known diffuse osseous metastatic disease throughout the visualized skeleton with mixed lytic and sclerotic lesions. A destructive lesion involving the left iliac bone and anterior iliac crest may contribute to flank pain. No evidence of acute pathologic fracture. 3. Septated left ovarian cyst versus 2 adjacent cysts measuring 4.8 x 2.7 cm, unchanged allowing for differences in caliper placement. 4. Uterine fibroids. Electronically Signed   By: MKeith RakeM.D.   On: 12/16/2020 22:07   DG Chest Portable 1 View  Result Date: 12/16/2020 CLINICAL DATA:  Left flank pain, history of metastatic breast cancer EXAM: PORTABLE CHEST 1 VIEW COMPARISON:  Radiograph 10/18/2020 FINDINGS: Low lung volumes and vascular crowding. No consolidative process, pneumothorax or effusion is seen. Stable cardiomediastinal contours accounting for differences in technique and inflation. The aorta is calcified. The remaining cardiomediastinal contours are unremarkable. Degenerative changes are present in the imaged spine and shoulders. Dextrocurvature of the midthoracic levels. Telemetry leads overlie the chest. IMPRESSION: 1. Low lung volumes and atelectasis with vascular crowding. 2. No other acute cardiopulmonary abnormality. Electronically Signed   By: PLovena LeM.D.   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 Marin Olp on 01/22/2021. She will follow up with radiation oncology in as needed basis in light of her close follow-up with medical oncology.    ____________________________________   Blair Promise, PhD, MD  This document serves as a record of services personally performed by  Gery Pray, MD. It was created on his behalf by Clerance Lav, a trained medical scribe. The creation of this record is based on the scribe's personal observations and the provider's statements to them. This document has been checked and approved by the attending provider.

## 2020-12-29 NOTE — Progress Notes (Incomplete)
  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 She did report poor appetite, mild nausea, and fatigue. She denied diarrhea, constipation, and issues with bladder. Pain in left hip was much improved at the end of treatment.   Plan: The patient will follow-up with radiation oncology in one month.  ________________________________________________   Blair Promise, PhD, MD  This document serves as a record of services personally performed by Gery Pray, MD. It was created on his behalf by Clerance Lav, a trained medical scribe. The creation of this record is based on the scribe's personal observations and the provider's statements to them. This document has been checked and approved by the attending provider.

## 2020-12-30 ENCOUNTER — Other Ambulatory Visit: Payer: Self-pay

## 2020-12-30 ENCOUNTER — Ambulatory Visit
Admission: RE | Admit: 2020-12-30 | Discharge: 2020-12-30 | Disposition: A | Discharge status: Home / Self Care | Payer: BC Managed Care – PPO | Source: Ambulatory Visit | Attending: Radiation Oncology | Admitting: Radiation Oncology

## 2020-12-30 ENCOUNTER — Encounter: Payer: Self-pay | Admitting: Radiation Oncology

## 2020-12-30 DIAGNOSIS — C50919 Malignant neoplasm of unspecified site of unspecified female breast: Secondary | ICD-10-CM

## 2020-12-30 DIAGNOSIS — K429 Umbilical hernia without obstruction or gangrene: Secondary | ICD-10-CM | POA: Insufficient documentation

## 2020-12-30 DIAGNOSIS — Z923 Personal history of irradiation: Secondary | ICD-10-CM | POA: Diagnosis not present

## 2020-12-30 DIAGNOSIS — C50411 Malignant neoplasm of upper-outer quadrant of right female breast: Secondary | ICD-10-CM | POA: Insufficient documentation

## 2020-12-30 DIAGNOSIS — J90 Pleural effusion, not elsewhere classified: Secondary | ICD-10-CM | POA: Diagnosis not present

## 2020-12-30 DIAGNOSIS — C7951 Secondary malignant neoplasm of bone: Secondary | ICD-10-CM | POA: Insufficient documentation

## 2020-12-30 DIAGNOSIS — Z170 Estrogen receptor positive status [ER+]: Secondary | ICD-10-CM | POA: Insufficient documentation

## 2020-12-30 DIAGNOSIS — Z79899 Other long term (current) drug therapy: Secondary | ICD-10-CM | POA: Insufficient documentation

## 2020-12-30 DIAGNOSIS — M899 Disorder of bone, unspecified: Secondary | ICD-10-CM

## 2020-12-30 NOTE — Progress Notes (Signed)
Patient is present for follow up for radiation treatment to left iliac crest. Reports mild fatigue, has not returned to baseline at this time. Reports recent hospitalization for colitis. Is currently taking antibiotics for this. Denies limited ambulation or movement. Denies nausea or vomiting. Denies dysuria, frequency or feeling of inability to empty bladder completely. Denies vaginal or rectal bleeding or discharge. Denies skin irritation.  Vitals:   12/30/20 1028  BP: 135/81  Pulse: 72  Resp: 18  Temp: (!) 97 F (36.1 C)  TempSrc: Temporal  SpO2: 97%  Weight: 98.6 kg  Height: 5\' 8"  (1.727 m)

## 2021-01-01 ENCOUNTER — Ambulatory Visit: Payer: BC Managed Care – PPO | Admitting: Hematology & Oncology

## 2021-01-01 ENCOUNTER — Ambulatory Visit: Payer: BC Managed Care – PPO

## 2021-01-01 ENCOUNTER — Other Ambulatory Visit: Payer: BC Managed Care – PPO

## 2021-01-07 ENCOUNTER — Encounter: Payer: Self-pay | Admitting: *Deleted

## 2021-01-19 ENCOUNTER — Other Ambulatory Visit: Payer: Self-pay | Admitting: Family

## 2021-01-19 DIAGNOSIS — C50919 Malignant neoplasm of unspecified site of unspecified female breast: Secondary | ICD-10-CM

## 2021-01-19 DIAGNOSIS — C7951 Secondary malignant neoplasm of bone: Secondary | ICD-10-CM

## 2021-01-19 MED FILL — Ondansetron HCl Tab 8 MG: ORAL | 24 days supply | Qty: 9 | Fill #0 | Status: AC

## 2021-01-20 ENCOUNTER — Other Ambulatory Visit: Payer: Self-pay | Admitting: Family

## 2021-01-20 ENCOUNTER — Other Ambulatory Visit (HOSPITAL_BASED_OUTPATIENT_CLINIC_OR_DEPARTMENT_OTHER): Payer: Self-pay

## 2021-01-20 ENCOUNTER — Encounter: Payer: Self-pay | Admitting: *Deleted

## 2021-01-20 ENCOUNTER — Other Ambulatory Visit: Payer: Self-pay | Admitting: *Deleted

## 2021-01-20 DIAGNOSIS — C50919 Malignant neoplasm of unspecified site of unspecified female breast: Secondary | ICD-10-CM

## 2021-01-20 DIAGNOSIS — C7951 Secondary malignant neoplasm of bone: Secondary | ICD-10-CM

## 2021-01-20 MED ORDER — FENTANYL 50 MCG/HR TD PT72: MEDICATED_PATCH | TRANSDERMAL | 0 refills | Status: DC

## 2021-01-20 MED ORDER — OXYCODONE HCL 5 MG PO TABS: ORAL_TABLET | ORAL | 0 refills | Status: DC | PRN

## 2021-01-20 MED ORDER — METAXALONE 400 MG PO TABS
ORAL_TABLET | ORAL | 0 refills | Status: DC
  Filled 2021-01-20: qty 30, fill #0

## 2021-01-20 MED ORDER — METAXALONE 400 MG PO TABS: 400.0000 mg | ORAL_TABLET | Freq: Three times a day (TID) | ORAL | 0 refills | Status: DC | PRN

## 2021-01-20 MED ORDER — CYCLOBENZAPRINE HCL 5 MG PO TABS: 5.0000 mg | ORAL_TABLET | Freq: Three times a day (TID) | ORAL | 0 refills | Status: DC | PRN

## 2021-01-22 ENCOUNTER — Inpatient Hospital Stay: Payer: BC Managed Care – PPO | Attending: Hematology & Oncology

## 2021-01-22 ENCOUNTER — Encounter: Payer: Self-pay | Admitting: *Deleted

## 2021-01-22 ENCOUNTER — Inpatient Hospital Stay (HOSPITAL_BASED_OUTPATIENT_CLINIC_OR_DEPARTMENT_OTHER): Payer: BC Managed Care – PPO | Admitting: Hematology & Oncology

## 2021-01-22 ENCOUNTER — Encounter: Payer: Self-pay | Admitting: Hematology & Oncology

## 2021-01-22 ENCOUNTER — Inpatient Hospital Stay: Payer: BC Managed Care – PPO

## 2021-01-22 ENCOUNTER — Telehealth: Payer: Self-pay | Admitting: *Deleted

## 2021-01-22 ENCOUNTER — Other Ambulatory Visit: Payer: Self-pay

## 2021-01-22 VITALS — BP 145/75 | HR 71 | Temp 98.6°F | Resp 16 | Wt 220.0 lb

## 2021-01-22 DIAGNOSIS — Z5111 Encounter for antineoplastic chemotherapy: Secondary | ICD-10-CM | POA: Insufficient documentation

## 2021-01-22 DIAGNOSIS — Z170 Estrogen receptor positive status [ER+]: Secondary | ICD-10-CM | POA: Insufficient documentation

## 2021-01-22 DIAGNOSIS — C7951 Secondary malignant neoplasm of bone: Secondary | ICD-10-CM | POA: Insufficient documentation

## 2021-01-22 DIAGNOSIS — C50919 Malignant neoplasm of unspecified site of unspecified female breast: Secondary | ICD-10-CM

## 2021-01-22 DIAGNOSIS — C50911 Malignant neoplasm of unspecified site of right female breast: Secondary | ICD-10-CM | POA: Diagnosis not present

## 2021-01-22 LAB — LACTATE DEHYDROGENASE: LDH: 198 U/L — ABNORMAL HIGH (ref 98–192)

## 2021-01-22 LAB — CBC WITH DIFFERENTIAL (CANCER CENTER ONLY)
Abs Immature Granulocytes: 0.01 10*3/uL (ref 0.00–0.07)
Basophils Absolute: 0.1 10*3/uL (ref 0.0–0.1)
Basophils Relative: 5 %
Eosinophils Absolute: 0 10*3/uL (ref 0.0–0.5)
Eosinophils Relative: 2 %
HCT: 34.3 % — ABNORMAL LOW (ref 36.0–46.0)
Hemoglobin: 11.8 g/dL — ABNORMAL LOW (ref 12.0–15.0)
Immature Granulocytes: 1 %
Lymphocytes Relative: 27 %
Lymphs Abs: 0.5 10*3/uL — ABNORMAL LOW (ref 0.7–4.0)
MCH: 32.1 pg (ref 26.0–34.0)
MCHC: 34.4 g/dL (ref 30.0–36.0)
MCV: 93.2 fL (ref 80.0–100.0)
Monocytes Absolute: 0.1 10*3/uL (ref 0.1–1.0)
Monocytes Relative: 6 %
Neutro Abs: 1 10*3/uL — ABNORMAL LOW (ref 1.7–7.7)
Neutrophils Relative %: 59 %
Platelet Count: 195 10*3/uL (ref 150–400)
RBC: 3.68 MIL/uL — ABNORMAL LOW (ref 3.87–5.11)
RDW: 21 % — ABNORMAL HIGH (ref 11.5–15.5)
WBC Count: 1.8 10*3/uL — ABNORMAL LOW (ref 4.0–10.5)
nRBC: 0 % (ref 0.0–0.2)

## 2021-01-22 LAB — CMP (CANCER CENTER ONLY)
ALT: 9 U/L (ref 0–44)
AST: 19 U/L (ref 15–41)
Albumin: 3.8 g/dL (ref 3.5–5.0)
Alkaline Phosphatase: 152 U/L — ABNORMAL HIGH (ref 38–126)
Anion gap: 8 (ref 5–15)
BUN: 12 mg/dL (ref 6–20)
CO2: 27 mmol/L (ref 22–32)
Calcium: 8.9 mg/dL (ref 8.9–10.3)
Chloride: 105 mmol/L (ref 98–111)
Creatinine: 0.6 mg/dL (ref 0.44–1.00)
GFR, Estimated: >60 mL/min (ref >60)
Glucose, Bld: 100 mg/dL — ABNORMAL HIGH (ref 70–99)
Potassium: 3.4 mmol/L — ABNORMAL LOW (ref 3.5–5.1)
Sodium: 140 mmol/L (ref 135–145)
Total Bilirubin: 0.5 mg/dL (ref 0.3–1.2)
Total Protein: 5.9 g/dL — ABNORMAL LOW (ref 6.5–8.1)

## 2021-01-22 MED ORDER — FULVESTRANT 250 MG/5ML IM SOLN
500.0000 mg | Freq: Once | INTRAMUSCULAR | Status: AC
Start: 2021-01-22 — End: 2021-01-22
  Administered 2021-01-22: 500 mg via INTRAMUSCULAR

## 2021-01-22 MED ORDER — FULVESTRANT 250 MG/5ML IM SOLN
INTRAMUSCULAR | Status: AC
  Filled 2021-01-22: qty 10

## 2021-01-22 MED ORDER — ZOLEDRONIC ACID 4 MG/100ML IV SOLN: 4.0000 mg | Freq: Once | INTRAVENOUS | Status: DC

## 2021-01-22 NOTE — Telephone Encounter (Signed)
Per los 01/22/21 - called patient and gave upcoming appointments

## 2021-01-22 NOTE — Patient Instructions (Signed)
Fulvestrant injection What is this medicine? FULVESTRANT (ful VES trant) blocks the effects of estrogen. It is used to treat breast cancer. This medicine may be used for other purposes; ask your health care provider or pharmacist if you have questions. COMMON BRAND NAME(S): FASLODEX What should I tell my health care provider before I take this medicine? They need to know if you have any of these conditions:  bleeding disorders  liver disease  low blood counts, like low white cell, platelet, or red cell counts  an unusual or allergic reaction to fulvestrant, other medicines, foods, dyes, or preservatives  pregnant or trying to get pregnant  breast-feeding How should I use this medicine? This medicine is for injection into a muscle. It is usually given by a health care professional in a hospital or clinic setting. Talk to your pediatrician regarding the use of this medicine in children. Special care may be needed. Overdosage: If you think you have taken too much of this medicine contact a poison control center or emergency room at once. NOTE: This medicine is only for you. Do not share this medicine with others. What if I miss a dose? It is important not to miss your dose. Call your doctor or health care professional if you are unable to keep an appointment. What may interact with this medicine?  medicines that treat or prevent blood clots like warfarin, enoxaparin, dalteparin, apixaban, dabigatran, and rivaroxaban This list may not describe all possible interactions. Give your health care provider a list of all the medicines, herbs, non-prescription drugs, or dietary supplements you use. Also tell them if you smoke, drink alcohol, or use illegal drugs. Some items may interact with your medicine. What should I watch for while using this medicine? Your condition will be monitored carefully while you are receiving this medicine. You will need important blood work done while you are taking  this medicine. Do not become pregnant while taking this medicine or for at least 1 year after stopping it. Women of child-bearing potential will need to have a negative pregnancy test before starting this medicine. Women should inform their doctor if they wish to become pregnant or think they might be pregnant. There is a potential for serious side effects to an unborn child. Men should inform their doctors if they wish to father a child. This medicine may lower sperm counts. Talk to your health care professional or pharmacist for more information. Do not breast-feed an infant while taking this medicine or for 1 year after the last dose. What side effects may I notice from receiving this medicine? Side effects that you should report to your doctor or health care professional as soon as possible:  allergic reactions like skin rash, itching or hives, swelling of the face, lips, or tongue  feeling faint or lightheaded, falls  pain, tingling, numbness, or weakness in the legs  signs and symptoms of infection like fever or chills; cough; flu-like symptoms; sore throat  vaginal bleeding Side effects that usually do not require medical attention (report to your doctor or health care professional if they continue or are bothersome):  aches, pains  constipation  diarrhea  headache  hot flashes  nausea, vomiting  pain at site where injected  stomach pain This list may not describe all possible side effects. Call your doctor for medical advice about side effects. You may report side effects to FDA at 1-800-FDA-1088. Where should I keep my medicine? This drug is given in a hospital or clinic and will   not be stored at home. NOTE: This sheet is a summary. It may not cover all possible information. If you have questions about this medicine, talk to your doctor, pharmacist, or health care provider.  2021 Elsevier/Gold Standard (2018-01-06 11:34:41)  

## 2021-01-22 NOTE — Progress Notes (Signed)
Hematology and Oncology Follow Up Visit  Annette James [id number] [birth year] 60 y.o. 01/22/2021   Principle Diagnosis:  Metastatic breast cancer-bone only metastasis-ER positive/PR positive/HER2 negative  Current Therapy:        Faslodex 500 mg IM monthly-start on 11/06/2020 Ribociclib 600 mg p.o. daily (21 days on/7 days off)-start on 11/06/2020 Zometa 4 mg IV q. 3 months -- next dose on 02/2021   Interim History:  Annette James is here today for follow-up.  She really looks quite good.  She is walking better.  Her pain seems to be under better control.  She has had no problems with diarrhea.  She had this episode of C. difficile about a month or so ago.  She is recovered from this.  Her last CA 27.29 was down to 100.  She has had no issues with cough or shortness of breath.  She is working.  She does computer work.  Thankfully, her company is very understanding unless her take some extra time during the day to rest.  She is eating better.  Her appetite is improved.  She is really not having problems with nausea or vomiting.  She has had no bleeding.  She has had no headache.  There has been no leg swelling.  She has had no rashes.  Sound like she will have a very nice Easter.  She has 4 daughters.  I am sure that they will take very good care of her.  Currently, her performance status is ECOG 1.    Medications:  Allergies as of 01/22/2021   No Known Allergies     Medication List       Accurate as of January 22, 2021  9:34 AM. If you have any questions, ask your nurse or doctor.        ascorbic acid 500 MG tablet Commonly known as: VITAMIN C Take 500 mg by mouth daily.   cyclobenzaprine 5 MG tablet Commonly known as: FLEXERIL Take 1 tablet (5 mg total) by mouth 3 (three) times daily as needed for muscle spasms.   dicyclomine 10 MG capsule Commonly known as: BENTYL Take 1 capsule (10 mg total) by mouth 3 (three) times daily between meals as needed for up to 30  doses for spasms.   fentaNYL 50 MCG/HR Commonly known as: DURAGESIC PLACE 1 PATCH ONTO THE SKIN EVERY 3 (THREE) DAYS.   LORazepam 0.5 MG tablet Commonly known as: ATIVAN TAKE 1 TABLET (0.5 MG TOTAL) BY MOUTH EVERY 8 (EIGHT) HOURS AS NEEDED FOR ANXIETY (NAUSEA).   ondansetron 8 MG tablet Commonly known as: ZOFRAN TAKE 1 TABLET (8 MG TOTAL) BY MOUTH EVERY 8 (EIGHT) HOURS AS NEEDED FOR NAUSEA OR VOMITING.   One-A-Day Womens 50 Plus Tabs Take 1 tablet by mouth daily.   oxyCODONE 5 MG immediate release tablet Commonly known as: Oxy IR/ROXICODONE TAKE 1-2 TABLETS BY MOUTH EVERY 4 HOURS AS NEEDED   palbociclib 125 MG tablet Commonly known as: Ibrance Take 1 tablet (125 mg total) by mouth daily. Take for 21 days on, 7 days off, repeat every 28 days.   pantoprazole sodium 40 mg/20 mL Pack Commonly known as: PROTONIX PLACE 20 MLS (40 MG TOTAL) INTO FEEDING TUBE 2 (TWO) TIMES DAILY.   thiamine 100 MG tablet TAKE 0.5 TABLET BY MOUTH ONCE DAILY.       Allergies: No Known Allergies  Past Medical History, Surgical history, Social history, and Family History were reviewed and updated.  Review of Systems: Review of Systems  Constitutional: Negative.   HENT: Negative.   Eyes: Negative.   Respiratory: Negative.   Cardiovascular: Negative.   Gastrointestinal: Negative.   Genitourinary: Negative.   Musculoskeletal: Positive for joint pain and neck pain.  Skin: Negative.   Neurological: Negative.   Endo/Heme/Allergies: Negative.   Psychiatric/Behavioral: Negative.       Physical Exam:  weight is 220 lb (99.8 kg). Her oral temperature is 98.6 F (37 C). Her blood pressure is 145/75 (abnormal) and her pulse is 71. Her respiration is 16 and oxygen saturation is 100%.   Wt Readings from Last 3 Encounters:  01/22/21 220 lb (99.8 kg)  12/30/20 217 lb 6 oz (98.6 kg)  12/24/20 214 lb (97.1 kg)    Physical Exam Vitals reviewed.  HENT:     Head: Normocephalic and atraumatic.   Eyes:     Pupils: Pupils are equal, round, and reactive to light.  Cardiovascular:     Rate and Rhythm: Normal rate and regular rhythm.     Heart sounds: Normal heart sounds.  Pulmonary:     Effort: Pulmonary effort is normal.     Breath sounds: Normal breath sounds.  Abdominal:     General: Bowel sounds are normal.     Palpations: Abdomen is soft.  Musculoskeletal:        General: No tenderness or deformity. Normal range of motion.     Cervical back: Normal range of motion.  Lymphadenopathy:     Cervical: No cervical adenopathy.  Skin:    General: Skin is warm and dry.     Findings: No erythema or rash.  Neurological:     Mental Status: She is alert and oriented to person, place, and time.  Psychiatric:        Behavior: Behavior normal.        Thought Content: Thought content normal.        Judgment: Judgment normal.      Lab Results  Component Value Date   WBC 1.8 (L) 01/22/2021   HGB 11.8 (L) 01/22/2021   HCT 34.3 (L) 01/22/2021   MCV 93.2 01/22/2021   PLT 195 01/22/2021   Lab Results  Component Value Date   FERRITIN 512 (H) 12/04/2020   IRON 96 12/04/2020   TIBC 244 12/04/2020   UIBC 147 12/04/2020   IRONPCTSAT 40 12/04/2020   Lab Results  Component Value Date   RBC 3.68 (L) 01/22/2021   Lab Results  Component Value Date   KAPLAMBRATIO 4.99 10/20/2020   Lab Results  Component Value Date   IGGSERUM 714 10/19/2020   IGA 153 10/19/2020   IGMSERUM 76 10/19/2020   Lab Results  Component Value Date   TOTALPROTELP 6.1 10/19/2020     Chemistry      Component Value Date/Time   NA 140 01/22/2021 0840   K 3.4 (L) 01/22/2021 0840   CL 105 01/22/2021 0840   CO2 27 01/22/2021 0840   BUN 12 01/22/2021 0840   CREATININE 0.60 01/22/2021 0840      Component Value Date/Time   CALCIUM 8.9 01/22/2021 0840   ALKPHOS 152 (H) 01/22/2021 0840   AST 19 01/22/2021 0840   ALT 9 01/22/2021 0840   BILITOT 0.5 01/22/2021 0840       Impression and Plan: Ms.  James is a very pleasant 60 yo caucasian female with metastatic breast cancer confined at this time to her bones.   Again, I am happy that her quality of life is improving.  This really is  helpful for her.  I am glad she is able to work.  She really enjoys her work.  Again, her company is incredibly compassionate toward her.  We will see what the CA 27.29 looks like.  I do not think that we have to do any scans on for right now.  I think as long as the CA 27.29 keeps coming down, we should be in good shape.  We will plan for another follow-up in 1 month.  She will get her Zometa/Xgeva when we see her back.   Volanda Napoleon, MD 4/13/20229:34 AM

## 2021-01-22 NOTE — Progress Notes (Signed)
Oncology Nurse Navigator Documentation  Oncology Nurse Navigator Flowsheets 01/22/2021  Abnormal Finding Date -  Confirmed Diagnosis Date -  Diagnosis Status -  Planned Course of Treatment -  Phase of Treatment -  Chemotherapy Pending- Reason: -  Chemotherapy Actual Start Date: -  Navigator Follow Up Date: 02/19/2021  Navigator Follow Up Reason: Follow-up Appointment  Navigator Location [facility]  Navigator Encounter Type Appt/Treatment Plan Review  Telephone -  Treatment Initiated Date -  Patient Visit Type MedOnc  Treatment Phase Active Tx  Barriers/Navigation Needs Coordination of Care;Education;Morbidities/Frailty  Education -  Interventions None Required  Acuity Level 2-Minimal Needs (1-2 Barriers Identified)  Referrals -  Coordination of Care -  Education Method -  Support Groups/Services Friends and Family  Time Spent with Patient 15

## 2021-01-23 LAB — CANCER ANTIGEN 27.29: CA 27.29: 61.6 U/mL — ABNORMAL HIGH (ref 0.0–38.6)

## 2021-02-13 ENCOUNTER — Other Ambulatory Visit: Payer: Self-pay | Admitting: Hematology & Oncology

## 2021-02-13 ENCOUNTER — Other Ambulatory Visit: Payer: Self-pay | Admitting: Family

## 2021-02-13 ENCOUNTER — Other Ambulatory Visit (HOSPITAL_BASED_OUTPATIENT_CLINIC_OR_DEPARTMENT_OTHER): Payer: Self-pay

## 2021-02-13 DIAGNOSIS — C50919 Malignant neoplasm of unspecified site of unspecified female breast: Secondary | ICD-10-CM

## 2021-02-13 DIAGNOSIS — C7951 Secondary malignant neoplasm of bone: Secondary | ICD-10-CM

## 2021-02-13 MED ORDER — FENTANYL 50 MCG/HR TD PT72: MEDICATED_PATCH | TRANSDERMAL | 0 refills | Status: DC

## 2021-02-13 MED ORDER — OXYCODONE HCL 5 MG PO TABS: ORAL_TABLET | ORAL | 0 refills | Status: DC | PRN

## 2021-02-13 MED ORDER — CYCLOBENZAPRINE HCL 5 MG PO TABS: 5.0000 mg | ORAL_TABLET | Freq: Three times a day (TID) | ORAL | 0 refills | Status: DC | PRN

## 2021-02-13 MED FILL — Ondansetron HCl Tab 8 MG: ORAL | 24 days supply | Qty: 9 | Fill #1 | Status: AC

## 2021-02-18 ENCOUNTER — Encounter: Payer: Self-pay | Admitting: *Deleted

## 2021-02-18 NOTE — Progress Notes (Signed)
Oncology Nurse Navigator Documentation  Oncology Nurse Navigator Flowsheets 02/18/2021  Abnormal Finding Date -  Confirmed Diagnosis Date -  Diagnosis Status -  Planned Course of Treatment -  Phase of Treatment -  Chemotherapy Pending- Reason: -  Chemotherapy Actual Start Date: -  Navigator Follow Up Date: 02/19/2021  Navigator Follow Up Reason: Follow-up Appointment  Navigator Location [facility]  Navigator Encounter Type MyChart;Letter/Fax/Email  Telephone -  Treatment Initiated Date -  Patient Visit Type MedOnc  Treatment Phase Active Tx  Barriers/Navigation Needs Coordination of Care;Education;Morbidities/Frailty  Education -  Interventions Coordination of Care;Disability/FMLA  Acuity Level 2-Minimal Needs (1-2 Barriers Identified)  Referrals -  Coordination of Care -  Education Method -  Support Groups/Services Friends and Family  Time Spent with Patient 20

## 2021-02-19 ENCOUNTER — Inpatient Hospital Stay (HOSPITAL_BASED_OUTPATIENT_CLINIC_OR_DEPARTMENT_OTHER): Payer: BC Managed Care – PPO | Admitting: Hematology & Oncology

## 2021-02-19 ENCOUNTER — Other Ambulatory Visit: Payer: Self-pay

## 2021-02-19 ENCOUNTER — Encounter: Payer: Self-pay | Admitting: *Deleted

## 2021-02-19 ENCOUNTER — Encounter: Payer: Self-pay | Admitting: Hematology & Oncology

## 2021-02-19 ENCOUNTER — Inpatient Hospital Stay: Payer: BC Managed Care – PPO | Attending: Hematology & Oncology

## 2021-02-19 ENCOUNTER — Telehealth: Payer: Self-pay

## 2021-02-19 ENCOUNTER — Inpatient Hospital Stay: Payer: BC Managed Care – PPO

## 2021-02-19 DIAGNOSIS — C50911 Malignant neoplasm of unspecified site of right female breast: Secondary | ICD-10-CM | POA: Diagnosis not present

## 2021-02-19 DIAGNOSIS — C7951 Secondary malignant neoplasm of bone: Secondary | ICD-10-CM

## 2021-02-19 DIAGNOSIS — Z5111 Encounter for antineoplastic chemotherapy: Secondary | ICD-10-CM | POA: Insufficient documentation

## 2021-02-19 DIAGNOSIS — C50919 Malignant neoplasm of unspecified site of unspecified female breast: Secondary | ICD-10-CM

## 2021-02-19 DIAGNOSIS — Z170 Estrogen receptor positive status [ER+]: Secondary | ICD-10-CM | POA: Insufficient documentation

## 2021-02-19 LAB — CBC WITH DIFFERENTIAL (CANCER CENTER ONLY)
Abs Immature Granulocytes: 0.02 10*3/uL (ref 0.00–0.07)
Basophils Absolute: 0.1 10*3/uL (ref 0.0–0.1)
Basophils Relative: 3 %
Eosinophils Absolute: 0 10*3/uL (ref 0.0–0.5)
Eosinophils Relative: 2 %
HCT: 32.6 % — ABNORMAL LOW (ref 36.0–46.0)
Hemoglobin: 11.3 g/dL — ABNORMAL LOW (ref 12.0–15.0)
Immature Granulocytes: 1 %
Lymphocytes Relative: 21 %
Lymphs Abs: 0.5 10*3/uL — ABNORMAL LOW (ref 0.7–4.0)
MCH: 34.3 pg — ABNORMAL HIGH (ref 26.0–34.0)
MCHC: 34.7 g/dL (ref 30.0–36.0)
MCV: 99.1 fL (ref 80.0–100.0)
Monocytes Absolute: 0.2 10*3/uL (ref 0.1–1.0)
Monocytes Relative: 8 %
Neutro Abs: 1.4 10*3/uL — ABNORMAL LOW (ref 1.7–7.7)
Neutrophils Relative %: 65 %
Platelet Count: 255 10*3/uL (ref 150–400)
RBC: 3.29 MIL/uL — ABNORMAL LOW (ref 3.87–5.11)
RDW: 17.1 % — ABNORMAL HIGH (ref 11.5–15.5)
WBC Count: 2.2 10*3/uL — ABNORMAL LOW (ref 4.0–10.5)
nRBC: 0 % (ref 0.0–0.2)

## 2021-02-19 LAB — CMP (CANCER CENTER ONLY)
ALT: 8 U/L (ref 0–44)
AST: 17 U/L (ref 15–41)
Albumin: 3.9 g/dL (ref 3.5–5.0)
Alkaline Phosphatase: 107 U/L (ref 38–126)
Anion gap: 6 (ref 5–15)
BUN: 7 mg/dL (ref 6–20)
CO2: 27 mmol/L (ref 22–32)
Calcium: 9.5 mg/dL (ref 8.9–10.3)
Chloride: 106 mmol/L (ref 98–111)
Creatinine: 0.65 mg/dL (ref 0.44–1.00)
GFR, Estimated: >60 mL/min (ref >60)
Glucose, Bld: 104 mg/dL — ABNORMAL HIGH (ref 70–99)
Potassium: 3.7 mmol/L (ref 3.5–5.1)
Sodium: 139 mmol/L (ref 135–145)
Total Bilirubin: 0.5 mg/dL (ref 0.3–1.2)
Total Protein: 6.1 g/dL — ABNORMAL LOW (ref 6.5–8.1)

## 2021-02-19 LAB — LACTATE DEHYDROGENASE: LDH: 222 U/L — ABNORMAL HIGH (ref 98–192)

## 2021-02-19 MED ORDER — FULVESTRANT 250 MG/5ML IM SOLN
INTRAMUSCULAR | Status: AC
  Filled 2021-02-19: qty 5

## 2021-02-19 MED ORDER — FULVESTRANT 250 MG/5ML IM SOLN
500.0000 mg | Freq: Once | INTRAMUSCULAR | Status: AC
  Administered 2021-02-19: 500 mg via INTRAMUSCULAR

## 2021-02-19 MED ORDER — SODIUM CHLORIDE 0.9 % IV SOLN
Freq: Once | INTRAVENOUS | Status: AC
Start: 2021-02-19 — End: 2021-02-19
  Filled 2021-02-19: qty 250

## 2021-02-19 MED ORDER — FENTANYL 50 MCG/HR TD PT72: 1.0000 | MEDICATED_PATCH | TRANSDERMAL | 0 refills | Status: DC

## 2021-02-19 MED ORDER — ZOLEDRONIC ACID 4 MG/100ML IV SOLN
4.0000 mg | Freq: Once | INTRAVENOUS | Status: AC
  Administered 2021-02-19: 4 mg via INTRAVENOUS
  Filled 2021-02-19: qty 100

## 2021-02-19 NOTE — Patient Instructions (Signed)
Zoledronic Acid Injection (Hypercalcemia, Oncology) What is this medicine? ZOLEDRONIC ACID (ZOE le dron ik AS id) slows calcium loss from bones. It high calcium levels in the blood from some kinds of cancer. It may be used in other people at risk for bone loss. This medicine may be used for other purposes; ask your health care provider or pharmacist if you have questions. COMMON BRAND NAME(S): Zometa What should I tell my health care provider before I take this medicine? They need to know if you have any of these conditions:  cancer  dehydration  dental disease  kidney disease  liver disease  low levels of calcium in the blood  lung or breathing disease (asthma)  receiving steroids like dexamethasone or prednisone  an unusual or allergic reaction to zoledronic acid, other medicines, foods, dyes, or preservatives  pregnant or trying to get pregnant  breast-feeding How should I use this medicine? This drug is injected into a vein. It is given by a health care provider in a hospital or clinic setting. Talk to your health care provider about the use of this drug in children. Special care may be needed. Overdosage: If you think you have taken too much of this medicine contact a poison control center or emergency room at once. NOTE: This medicine is only for you. Do not share this medicine with others. What if I miss a dose? Keep appointments for follow-up doses. It is important not to miss your dose. Call your health care provider if you are unable to keep an appointment. What may interact with this medicine?  certain antibiotics given by injection  NSAIDs, medicines for pain and inflammation, like ibuprofen or naproxen  some diuretics like bumetanide, furosemide  teriparatide  thalidomide This list may not describe all possible interactions. Give your health care provider a list of all the medicines, herbs, non-prescription drugs, or dietary supplements you use. Also tell  them if you smoke, drink alcohol, or use illegal drugs. Some items may interact with your medicine. What should I watch for while using this medicine? Visit your health care provider for regular checks on your progress. It may be some time before you see the benefit from this drug. Some people who take this drug have severe bone, joint, or muscle pain. This drug may also increase your risk for jaw problems or a broken thigh bone. Tell your health care provider right away if you have severe pain in your jaw, bones, joints, or muscles. Tell you health care provider if you have any pain that does not go away or that gets worse. Tell your dentist and dental surgeon that you are taking this drug. You should not have major dental surgery while on this drug. See your dentist to have a dental exam and fix any dental problems before starting this drug. Take good care of your teeth while on this drug. Make sure you see your dentist for regular follow-up appointments. You should make sure you get enough calcium and vitamin D while you are taking this drug. Discuss the foods you eat and the vitamins you take with your health care provider. Check with your health care provider if you have severe diarrhea, nausea, and vomiting, or if you sweat a lot. The loss of too much body fluid may make it dangerous for you to take this drug. You may need blood work done while you are taking this drug. Do not become pregnant while taking this drug. Women should inform their health care provider   if they wish to become pregnant or think they might be pregnant. There is potential for serious harm to an unborn child. Talk to your health care provider for more information. What side effects may I notice from receiving this medicine? Side effects that you should report to your doctor or health care provider as soon as possible:  allergic reactions (skin rash, itching or hives; swelling of the face, lips, or tongue)  bone  pain  infection (fever, chills, cough, sore throat, pain or trouble passing urine)  jaw pain, especially after dental work  joint pain  kidney injury (trouble passing urine or change in the amount of urine)  low blood pressure (dizziness; feeling faint or lightheaded, falls; unusually weak or tired)  low calcium levels (fast heartbeat; muscle cramps or pain; pain, tingling, or numbness in the hands or feet; seizures)  low magnesium levels (fast, irregular heartbeat; muscle cramp or pain; muscle weakness; tremors; seizures)  low red blood cell counts (trouble breathing; feeling faint; lightheaded, falls; unusually weak or tired)  muscle pain  redness, blistering, peeling, or loosening of the skin, including inside the mouth  severe diarrhea  swelling of the ankles, feet, hands  trouble breathing Side effects that usually do not require medical attention (report to your doctor or health care provider if they continue or are bothersome):  anxious  constipation  coughing  depressed mood  eye irritation, itching, or pain  fever  general ill feeling or flu-like symptoms  nausea  pain, redness, or irritation at site where injected  trouble sleeping This list may not describe all possible side effects. Call your doctor for medical advice about side effects. You may report side effects to FDA at 1-800-FDA-1088. Where should I keep my medicine? This drug is given in a hospital or clinic. It will not be stored at home. NOTE: This sheet is a summary. It may not cover all possible information. If you have questions about this medicine, talk to your doctor, pharmacist, or health care provider.  2021 Elsevier/Gold Standard (2019-07-13 09:13:00)  

## 2021-02-19 NOTE — Progress Notes (Signed)
Oncology Nurse Navigator Documentation  Oncology Nurse Navigator Flowsheets 02/19/2021  Abnormal Finding Date -  Confirmed Diagnosis Date -  Diagnosis Status -  Planned Course of Treatment -  Phase of Treatment -  Chemotherapy Pending- Reason: -  Chemotherapy Actual Start Date: -  Navigator Follow Up Date: 03/11/2021  Navigator Follow Up Reason: Scan Review  Navigator Location [facility]  Navigator Encounter Type Appt/Treatment Plan Review  Telephone -  Treatment Initiated Date -  Patient Visit Type MedOnc  Treatment Phase Active Tx  Barriers/Navigation Needs Coordination of Care;Education;Morbidities/Frailty  Education -  Interventions Coordination of Care;Education  Acuity Level 2-Minimal Needs (1-2 Barriers Identified)  Referrals -  Coordination of Care Radiology  Education Method Verbal  Support Groups/Services Friends and Family  Time Spent with Patient 32

## 2021-02-19 NOTE — Patient Instructions (Signed)
Fulvestrant injection What is this medicine? FULVESTRANT (ful VES trant) blocks the effects of estrogen. It is used to treat breast cancer. This medicine may be used for other purposes; ask your health care provider or pharmacist if you have questions. COMMON BRAND NAME(S): FASLODEX What should I tell my health care provider before I take this medicine? They need to know if you have any of these conditions:  bleeding disorders  liver disease  low blood counts, like low white cell, platelet, or red cell counts  an unusual or allergic reaction to fulvestrant, other medicines, foods, dyes, or preservatives  pregnant or trying to get pregnant  breast-feeding How should I use this medicine? This medicine is for injection into a muscle. It is usually given by a health care professional in a hospital or clinic setting. Talk to your pediatrician regarding the use of this medicine in children. Special care may be needed. Overdosage: If you think you have taken too much of this medicine contact a poison control center or emergency room at once. NOTE: This medicine is only for you. Do not share this medicine with others. What if I miss a dose? It is important not to miss your dose. Call your doctor or health care professional if you are unable to keep an appointment. What may interact with this medicine?  medicines that treat or prevent blood clots like warfarin, enoxaparin, dalteparin, apixaban, dabigatran, and rivaroxaban This list may not describe all possible interactions. Give your health care provider a list of all the medicines, herbs, non-prescription drugs, or dietary supplements you use. Also tell them if you smoke, drink alcohol, or use illegal drugs. Some items may interact with your medicine. What should I watch for while using this medicine? Your condition will be monitored carefully while you are receiving this medicine. You will need important blood work done while you are taking  this medicine. Do not become pregnant while taking this medicine or for at least 1 year after stopping it. Women of child-bearing potential will need to have a negative pregnancy test before starting this medicine. Women should inform their doctor if they wish to become pregnant or think they might be pregnant. There is a potential for serious side effects to an unborn child. Men should inform their doctors if they wish to father a child. This medicine may lower sperm counts. Talk to your health care professional or pharmacist for more information. Do not breast-feed an infant while taking this medicine or for 1 year after the last dose. What side effects may I notice from receiving this medicine? Side effects that you should report to your doctor or health care professional as soon as possible:  allergic reactions like skin rash, itching or hives, swelling of the face, lips, or tongue  feeling faint or lightheaded, falls  pain, tingling, numbness, or weakness in the legs  signs and symptoms of infection like fever or chills; cough; flu-like symptoms; sore throat  vaginal bleeding Side effects that usually do not require medical attention (report to your doctor or health care professional if they continue or are bothersome):  aches, pains  constipation  diarrhea  headache  hot flashes  nausea, vomiting  pain at site where injected  stomach pain This list may not describe all possible side effects. Call your doctor for medical advice about side effects. You may report side effects to FDA at 1-800-FDA-1088. Where should I keep my medicine? This drug is given in a hospital or clinic and will   not be stored at home. NOTE: This sheet is a summary. It may not cover all possible information. If you have questions about this medicine, talk to your doctor, pharmacist, or health care provider.  2021 Elsevier/Gold Standard (2018-01-06 11:34:41)  

## 2021-02-19 NOTE — Telephone Encounter (Signed)
appts made per 02/19/21 los and pt will gain her sch in tx/avs and through [employer]

## 2021-02-19 NOTE — Progress Notes (Signed)
Hematology and Oncology Follow Up Visit  Annette James [id number] Oct 01, 1961 60 y.o. 02/19/2021   Principle Diagnosis:  Metastatic breast cancer-bone only metastasis-ER positive/PR positive/HER2 negative  Current Therapy:        Faslodex 500 mg IM monthly-start on 11/06/2020 Ribociclib 600 mg p.o. daily (21 days on/7 days off)-start on 11/06/2020 Zometa 4 mg IV q. 3 months -- next dose on 05/2021   Interim History:  Annette James is here today for follow-up.  She did have a wonderful Mother's Day weekend.  Her daughters really were good to her which is not unexpected.  They are such a good family.  Her last CA 27.29 was down to 61.  Her alkaline phosphatase is coming down nicely.  Her initial PET scan was done 4 months ago.  We really have to get her set up with another 1.  She has had no problems with bowels or bladder.  She had the episode of C. difficile back in March.  She is recovered from this.  She is still working.  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  She has had no problems with bleeding.  She has had no nausea or vomiting.  She does have a little bit of swelling in the left leg.   There is no headache.  She has had no rashes.  There is no cough or shortness of breath.  Overall, performance status is ECOG 1.    Medications:  Allergies as of 02/19/2021   No Known Allergies     Medication List       Accurate as of Feb 19, 2021  8:39 AM. If you have any questions, ask your nurse or doctor.        STOP taking these medications   pantoprazole sodium 40 mg/20 mL Pack Commonly known as: PROTONIX Stopped by: Volanda Napoleon, MD   thiamine 100 MG tablet Stopped by: Volanda Napoleon, MD     TAKE these medications   ascorbic acid 500 MG tablet Commonly known as: VITAMIN C Take 500 mg by mouth daily.   cyclobenzaprine 5 MG tablet Commonly known as: FLEXERIL Take 1 tablet (5 mg total) by mouth 3 (three) times daily as needed for muscle  spasms.   dicyclomine 10 MG capsule Commonly known as: BENTYL Take 1 capsule (10 mg total) by mouth 3 (three) times daily between meals as needed for up to 30 doses for spasms.   fentaNYL 50 MCG/HR Commonly known as: DURAGESIC PLACE 1 PATCH ONTO THE SKIN EVERY 3 (THREE) DAYS.   LORazepam 0.5 MG tablet Commonly known as: ATIVAN TAKE 1 TABLET (0.5 MG TOTAL) BY MOUTH EVERY 8 (EIGHT) HOURS AS NEEDED FOR ANXIETY (NAUSEA).   ondansetron 8 MG tablet Commonly known as: ZOFRAN TAKE 1 TABLET (8 MG TOTAL) BY MOUTH EVERY 8 (EIGHT) HOURS AS NEEDED FOR NAUSEA OR VOMITING.   One-A-Day Womens 50 Plus Tabs Take 1 tablet by mouth daily.   oxyCODONE 5 MG immediate release tablet Commonly known as: Oxy IR/ROXICODONE TAKE 1-2 TABLETS BY MOUTH EVERY 4 HOURS AS NEEDED   palbociclib 125 MG tablet Commonly known as: Ibrance Take 1 tablet (125 mg total) by mouth daily. Take for 21 days on, 7 days off, repeat every 28 days.       Allergies: No Known Allergies  Past Medical History, Surgical history, Social history, and Family History were reviewed and updated.  Review of Systems: Review of Systems  Constitutional: Negative.   HENT: Negative.  Eyes: Negative.   Respiratory: Negative.   Cardiovascular: Negative.   Gastrointestinal: Negative.   Genitourinary: Negative.   Musculoskeletal: Positive for joint pain and neck pain.  Skin: Negative.   Neurological: Negative.   Endo/Heme/Allergies: Negative.   Psychiatric/Behavioral: Negative.       Physical Exam:  height is _0  (1.727 m) and weight is 219 lb (99.3 kg). Her oral temperature is 98.6 F (37 C). Her blood pressure is 161/88 (abnormal) and her pulse is 80. Her respiration is 18 and oxygen saturation is 99%.   Wt Readings from Last 3 Encounters:  02/19/21 219 lb (99.3 kg)  01/22/21 220 lb (99.8 kg)  12/30/20 217 lb 6 oz (98.6 kg)    Physical Exam Vitals reviewed.  HENT:     Head: Normocephalic and atraumatic.  Eyes:      Pupils: Pupils are equal, round, and reactive to light.  Cardiovascular:     Rate and Rhythm: Normal rate and regular rhythm.     Heart sounds: Normal heart sounds.  Pulmonary:     Effort: Pulmonary effort is normal.     Breath sounds: Normal breath sounds.  Abdominal:     General: Bowel sounds are normal.     Palpations: Abdomen is soft.  Musculoskeletal:        General: No tenderness or deformity. Normal range of motion.     Cervical back: Normal range of motion.  Lymphadenopathy:     Cervical: No cervical adenopathy.  Skin:    General: Skin is warm and dry.     Findings: No erythema or rash.  Neurological:     Mental Status: She is alert and oriented to person, place, and time.  Psychiatric:        Behavior: Behavior normal.        Thought Content: Thought content normal.        Judgment: Judgment normal.      Lab Results  Component Value Date   WBC 2.2 (L) 02/19/2021   HGB 11.3 (L) 02/19/2021   HCT 32.6 (L) 02/19/2021   MCV 99.1 02/19/2021   PLT 255 02/19/2021   Lab Results  Component Value Date   FERRITIN 512 (H) 12/04/2020   IRON 96 12/04/2020   TIBC 244 12/04/2020   UIBC 147 12/04/2020   IRONPCTSAT 40 12/04/2020   Lab Results  Component Value Date   RBC 3.29 (L) 02/19/2021   Lab Results  Component Value Date   KAPLAMBRATIO 4.99 10/20/2020   Lab Results  Component Value Date   IGGSERUM 714 10/19/2020   IGA 153 10/19/2020   IGMSERUM 76 10/19/2020   Lab Results  Component Value Date   TOTALPROTELP 6.1 10/19/2020     Chemistry      Component Value Date/Time   NA 140 01/22/2021 0840   K 3.4 (L) 01/22/2021 0840   CL 105 01/22/2021 0840   CO2 27 01/22/2021 0840   BUN 12 01/22/2021 0840   CREATININE 0.60 01/22/2021 0840      Component Value Date/Time   CALCIUM 8.9 01/22/2021 0840   ALKPHOS 152 (H) 01/22/2021 0840   AST 19 01/22/2021 0840   ALT 9 01/22/2021 0840   BILITOT 0.5 01/22/2021 0840       Impression and Plan: Annette James is a  very pleasant 60 yo caucasian female with metastatic breast cancer confined at this time to her bones.   I have to believe that she is responding nicely.  We will see what her CA  27.29 is.  Again, the alkaline phosphatase come down quite well.  She is having some problems with the fentanyl patch.  She says that the third day she started to feel her bony pain.  We will adjust the frequency to every 2 days for the fentanyl patch.  We will set her up with a PET scan.  We will get this done in a few weeks.  We will plan to get her back in 1 month.  She gets her Zometa today.  Her next dose will be in August.   Volanda Napoleon, MD 5/11/20228:39 AM

## 2021-02-20 ENCOUNTER — Telehealth: Payer: Self-pay | Admitting: *Deleted

## 2021-02-20 LAB — CANCER ANTIGEN 27.29: CA 27.29: 41.2 U/mL — ABNORMAL HIGH (ref 0.0–38.6)

## 2021-02-20 NOTE — Telephone Encounter (Signed)
Notified pt of results. No concerns at this time. 

## 2021-02-25 ENCOUNTER — Other Ambulatory Visit: Payer: Self-pay | Admitting: Family

## 2021-02-25 ENCOUNTER — Other Ambulatory Visit: Payer: Self-pay | Admitting: Hematology & Oncology

## 2021-02-25 DIAGNOSIS — C7951 Secondary malignant neoplasm of bone: Secondary | ICD-10-CM

## 2021-02-25 DIAGNOSIS — C50919 Malignant neoplasm of unspecified site of unspecified female breast: Secondary | ICD-10-CM

## 2021-03-11 ENCOUNTER — Other Ambulatory Visit: Payer: Self-pay | Admitting: Hematology & Oncology

## 2021-03-11 ENCOUNTER — Encounter: Payer: Self-pay | Admitting: *Deleted

## 2021-03-11 ENCOUNTER — Ambulatory Visit (HOSPITAL_COMMUNITY)
Admission: RE | Admit: 2021-03-11 | Discharge: 2021-03-11 | Disposition: A | Discharge status: Home / Self Care | Payer: BC Managed Care – PPO | Source: Ambulatory Visit | Attending: Hematology & Oncology | Admitting: Hematology & Oncology

## 2021-03-11 ENCOUNTER — Other Ambulatory Visit: Payer: Self-pay

## 2021-03-11 DIAGNOSIS — C7951 Secondary malignant neoplasm of bone: Secondary | ICD-10-CM

## 2021-03-11 DIAGNOSIS — C50919 Malignant neoplasm of unspecified site of unspecified female breast: Secondary | ICD-10-CM

## 2021-03-11 LAB — GLUCOSE, CAPILLARY: Glucose-Capillary: 106 mg/dL — ABNORMAL HIGH (ref 70–99)

## 2021-03-11 MED ORDER — CYCLOBENZAPRINE HCL 5 MG PO TABS: 5.0000 mg | ORAL_TABLET | Freq: Three times a day (TID) | ORAL | 2 refills | Status: DC | PRN

## 2021-03-11 MED ORDER — FLUDEOXYGLUCOSE F - 18 (FDG) INJECTION
11.0000 | Freq: Once | INTRAVENOUS | Status: AC | PRN
  Administered 2021-03-11: 10.49 via INTRAVENOUS

## 2021-03-11 MED ORDER — OXYCODONE HCL 5 MG PO TABS: ORAL_TABLET | ORAL | 0 refills | Status: DC | PRN

## 2021-03-11 NOTE — Progress Notes (Signed)
Spoke to patient and gave her the following results  Ennever, Rudell Cobb, MD  P Onc Nurse Hp Please call and let her know that the PET scan shows marked improvement in her disease. The treatment is working very nicely as we knew it would. Thanks much. [facility]   Oncology Nurse Navigator Documentation  Oncology Nurse Navigator Flowsheets 03/11/2021  Abnormal Finding Date -  Confirmed Diagnosis Date -  Diagnosis Status -  Planned Course of Treatment -  Phase of Treatment -  Chemotherapy Pending- Reason: -  Chemotherapy Actual Start Date: -  Navigator Follow Up Date: 03/19/2021  Navigator Follow Up Reason: Follow-up Appointment  Navigator Location [facility]  Navigator Encounter Type Telephone  Telephone Diagnostic Results;Outgoing Call  Treatment Initiated Date -  Patient Visit Type MedOnc  Treatment Phase Active Tx  Barriers/Navigation Needs Coordination of Care;Education;Morbidities/Frailty  Education Other  Interventions Education;Psycho-Social Support  Acuity Level 2-Minimal Needs (1-2 Barriers Identified)  Referrals -  Coordination of Care -  Education Method Verbal  Support Groups/Services Friends and Family  Time Spent with Patient 15

## 2021-03-11 NOTE — Progress Notes (Signed)
Oncology Nurse Navigator Documentation  Oncology Nurse Navigator Flowsheets 03/11/2021  Abnormal Finding Date -  Confirmed Diagnosis Date -  Diagnosis Status -  Planned Course of Treatment -  Phase of Treatment -  Chemotherapy Pending- Reason: -  Chemotherapy Actual Start Date: -  Navigator Follow Up Date: 03/19/2021  Navigator Follow Up Reason: Follow-up Appointment  Navigator Location [facility]  Navigator Encounter Type Scan Review  Telephone -  Treatment Initiated Date -  Patient Visit Type MedOnc  Treatment Phase Active Tx  Barriers/Navigation Needs Coordination of Care;Education;Morbidities/Frailty  Education -  Interventions None Required  Acuity Level 2-Minimal Needs (1-2 Barriers Identified)  Referrals -  Coordination of Care -  Education Method -  Support Groups/Services Friends and Family  Time Spent with Patient 15

## 2021-03-12 ENCOUNTER — Encounter: Payer: Self-pay | Admitting: *Deleted

## 2021-03-12 NOTE — Progress Notes (Signed)
Patient continues to have trouble filling her fentanyl patches even after office obtained a PA for dosage adjustment. She is now out of patches.  Spoke with pharmacy and patient's insurance. Eventually an emergency override was obtained to reflect the new administration instructions and allow for patient to pick up a 30 day supply of her patches as prescribed, today.   Oncology Nurse Navigator Documentation  Oncology Nurse Navigator Flowsheets 03/12/2021  Abnormal Finding Date -  Confirmed Diagnosis Date -  Diagnosis Status -  Planned Course of Treatment -  Phase of Treatment -  Chemotherapy Pending- Reason: -  Chemotherapy Actual Start Date: -  Navigator Follow Up Date: 03/19/2021  Navigator Follow Up Reason: Follow-up Appointment  Navigator Location [facility]  Navigator Encounter Type MyChart;Telephone  Telephone Medication Assistance;Outgoing Call  Treatment Initiated Date -  Patient Visit Type MedOnc  Treatment Phase Active Tx  Barriers/Navigation Needs Coordination of Care;Education;Morbidities/Frailty  Education Other  Interventions Coordination of Care;Education;Psycho-Social Support  Acuity Level 2-Minimal Needs (1-2 Barriers Identified)  Referrals -  Coordination of Care Other  Education Method Written  Support Groups/Services Friends and Family  Time Spent with Patient 54

## 2021-03-17 ENCOUNTER — Other Ambulatory Visit (HOSPITAL_BASED_OUTPATIENT_CLINIC_OR_DEPARTMENT_OTHER): Payer: Self-pay

## 2021-03-17 MED FILL — Ondansetron HCl Tab 8 MG: ORAL | 24 days supply | Qty: 9 | Fill #2 | Status: AC

## 2021-03-19 ENCOUNTER — Telehealth: Payer: Self-pay

## 2021-03-19 ENCOUNTER — Inpatient Hospital Stay: Payer: BC Managed Care – PPO | Attending: Hematology & Oncology

## 2021-03-19 ENCOUNTER — Encounter: Payer: Self-pay | Admitting: *Deleted

## 2021-03-19 ENCOUNTER — Encounter: Payer: Self-pay | Admitting: Hematology & Oncology

## 2021-03-19 ENCOUNTER — Ambulatory Visit: Payer: BC Managed Care – PPO

## 2021-03-19 ENCOUNTER — Other Ambulatory Visit: Payer: Self-pay

## 2021-03-19 ENCOUNTER — Inpatient Hospital Stay (HOSPITAL_BASED_OUTPATIENT_CLINIC_OR_DEPARTMENT_OTHER): Payer: BC Managed Care – PPO | Admitting: Hematology & Oncology

## 2021-03-19 ENCOUNTER — Inpatient Hospital Stay: Payer: BC Managed Care – PPO

## 2021-03-19 VITALS — BP 152/87 | HR 78 | Temp 98.3°F | Resp 18 | Ht 68.0 in | Wt 211.4 lb

## 2021-03-19 DIAGNOSIS — C50919 Malignant neoplasm of unspecified site of unspecified female breast: Secondary | ICD-10-CM | POA: Diagnosis not present

## 2021-03-19 DIAGNOSIS — Z5111 Encounter for antineoplastic chemotherapy: Secondary | ICD-10-CM | POA: Diagnosis not present

## 2021-03-19 DIAGNOSIS — C7951 Secondary malignant neoplasm of bone: Secondary | ICD-10-CM

## 2021-03-19 DIAGNOSIS — Z170 Estrogen receptor positive status [ER+]: Secondary | ICD-10-CM | POA: Insufficient documentation

## 2021-03-19 LAB — CBC WITH DIFFERENTIAL (CANCER CENTER ONLY)
Abs Immature Granulocytes: 0 10*3/uL (ref 0.00–0.07)
Basophils Absolute: 0.1 10*3/uL (ref 0.0–0.1)
Basophils Relative: 3 %
Eosinophils Absolute: 0 10*3/uL (ref 0.0–0.5)
Eosinophils Relative: 1 %
HCT: 33.9 % — ABNORMAL LOW (ref 36.0–46.0)
Hemoglobin: 11.8 g/dL — ABNORMAL LOW (ref 12.0–15.0)
Immature Granulocytes: 0 %
Lymphocytes Relative: 26 %
Lymphs Abs: 0.6 10*3/uL — ABNORMAL LOW (ref 0.7–4.0)
MCH: 35 pg — ABNORMAL HIGH (ref 26.0–34.0)
MCHC: 34.8 g/dL (ref 30.0–36.0)
MCV: 100.6 fL — ABNORMAL HIGH (ref 80.0–100.0)
Monocytes Absolute: 0.1 10*3/uL (ref 0.1–1.0)
Monocytes Relative: 6 %
Neutro Abs: 1.4 10*3/uL — ABNORMAL LOW (ref 1.7–7.7)
Neutrophils Relative %: 64 %
Platelet Count: 286 10*3/uL (ref 150–400)
RBC: 3.37 MIL/uL — ABNORMAL LOW (ref 3.87–5.11)
RDW: 14 % (ref 11.5–15.5)
WBC Count: 2.2 10*3/uL — ABNORMAL LOW (ref 4.0–10.5)
nRBC: 0 % (ref 0.0–0.2)

## 2021-03-19 LAB — CMP (CANCER CENTER ONLY)
ALT: 8 U/L (ref 0–44)
AST: 16 U/L (ref 15–41)
Albumin: 4.3 g/dL (ref 3.5–5.0)
Alkaline Phosphatase: 81 U/L (ref 38–126)
Anion gap: 7 (ref 5–15)
BUN: 10 mg/dL (ref 6–20)
CO2: 28 mmol/L (ref 22–32)
Calcium: 9.3 mg/dL (ref 8.9–10.3)
Chloride: 104 mmol/L (ref 98–111)
Creatinine: 0.71 mg/dL (ref 0.44–1.00)
GFR, Estimated: >60 mL/min (ref >60)
Glucose, Bld: 104 mg/dL — ABNORMAL HIGH (ref 70–99)
Potassium: 4.3 mmol/L (ref 3.5–5.1)
Sodium: 139 mmol/L (ref 135–145)
Total Bilirubin: 0.5 mg/dL (ref 0.3–1.2)
Total Protein: 6.3 g/dL — ABNORMAL LOW (ref 6.5–8.1)

## 2021-03-19 MED ORDER — FULVESTRANT 250 MG/5ML IM SOLN
500.0000 mg | Freq: Once | INTRAMUSCULAR | Status: AC
  Administered 2021-03-19: 500 mg via INTRAMUSCULAR

## 2021-03-19 MED ORDER — FULVESTRANT 250 MG/5ML IM SOLN
INTRAMUSCULAR | Status: AC
  Filled 2021-03-19: qty 10

## 2021-03-19 NOTE — Telephone Encounter (Signed)
appts made per 03/19/21 los and pt to gain sch at checkout and through [employer]

## 2021-03-19 NOTE — Progress Notes (Signed)
Hematology and Oncology Follow Up Visit  Annette James [id number] Jan 29, 1961 60 y.o. 03/19/2021   Principle Diagnosis:  Metastatic breast cancer-bone only metastasis-ER positive/PR positive/HER2 negative  Current Therapy:        Faslodex 500 mg IM monthly-start on 11/06/2020 Ribociclib 600 mg p.o. daily (21 days on/7 days off)-start on 11/06/2020 Zometa 4 mg IV q. 3 months -- next dose on 05/2021   Interim History:  Ms. Annette James is here today for follow-up.  She looks fantastic.  She feels good.  She is working without any problems.  As expected, her PET scan does look better.  We did a PET scan on May 31.  There was marked improvement in activity with her bony metastasis.  Her last CA 27.29 was down to 41.  She is very happy with how well things are gone.  She really excited about next week when a grandson is going to be born.  This will be on June 16.  She is eating well.  She is losing weight because she is more active.  She has had no problems with nausea or vomiting.  She is had no cough or shortness of breath.  There is been no rashes.  She has had no leg swelling.  There is been no headaches.  Overall, her performance status is ECOG 1.   Medications:  Allergies as of 03/19/2021   No Known Allergies     Medication List       Accurate as of March 19, 2021  8:50 AM. If you have any questions, ask your nurse or doctor.        ascorbic acid 500 MG tablet Commonly known as: VITAMIN C Take 500 mg by mouth daily.   cyclobenzaprine 5 MG tablet Commonly known as: FLEXERIL Take 1 tablet (5 mg total) by mouth 3 (three) times daily as needed for muscle spasms.   dicyclomine 10 MG capsule Commonly known as: BENTYL Take 1 capsule (10 mg total) by mouth 3 (three) times daily between meals as needed for up to 30 doses for spasms.   fentaNYL 50 MCG/HR Commonly known as: [place] 1 patch onto the skin every other day.   Ibrance 125 MG tablet Generic drug:  palbociclib TAKE 1 TABLET BY MOUTH ONCE DAILY WITH OR WITHOUT FOOD  FOR 21 DAYS, FOLLOWED BY 7  DAYS OFF, REPEATED EVERY 28 DAYS   LORazepam 0.5 MG tablet Commonly known as: ATIVAN TAKE 1 TABLET (0.5 MG TOTAL) BY MOUTH EVERY 8 (EIGHT) HOURS AS NEEDED FOR ANXIETY (NAUSEA).   ondansetron 8 MG tablet Commonly known as: ZOFRAN TAKE 1 TABLET (8 MG TOTAL) BY MOUTH EVERY 8 (EIGHT) HOURS AS NEEDED FOR NAUSEA OR VOMITING.   One-A-Day Womens 50 Plus Tabs Take 1 tablet by mouth daily.   oxyCODONE 5 MG immediate release tablet Commonly known as: Oxy IR/ROXICODONE TAKE 1-2 TABLETS BY MOUTH EVERY 4 HOURS AS NEEDED       Allergies: No Known Allergies  Past Medical History, Surgical history, Social history, and Family History were reviewed and updated.  Review of Systems: Review of Systems  Constitutional: Negative.   HENT: Negative.   Eyes: Negative.   Respiratory: Negative.   Cardiovascular: Negative.   Gastrointestinal: Negative.   Genitourinary: Negative.   Musculoskeletal: Positive for joint pain and neck pain.  Skin: Negative.   Neurological: Negative.   Endo/Heme/Allergies: Negative.   Psychiatric/Behavioral: Negative.       Physical Exam:  height is '5\' 8"'  (1.727 m) and  weight is 95.9 kg. Her oral temperature is 98.3 F (36.8 C). Her blood pressure is 152/87 (abnormal) and her pulse is 78. Her respiration is 18 and oxygen saturation is 100%.   Wt Readings from Last 3 Encounters:  03/19/21 95.9 kg  02/19/21 99.3 kg  01/22/21 99.8 kg    Physical Exam Vitals reviewed.  HENT:     Head: Normocephalic and atraumatic.  Eyes:     Pupils: Pupils are equal, round, and reactive to light.  Cardiovascular:     Rate and Rhythm: Normal rate and regular rhythm.     Heart sounds: Normal heart sounds.  Pulmonary:     Effort: Pulmonary effort is normal.     Breath sounds: Normal breath sounds.  Abdominal:     General: Bowel sounds are normal.     Palpations: Abdomen is soft.   Musculoskeletal:        General: No tenderness or deformity. Normal range of motion.     Cervical back: Normal range of motion.  Lymphadenopathy:     Cervical: No cervical adenopathy.  Skin:    General: Skin is warm and dry.     Findings: No erythema or rash.  Neurological:     Mental Status: She is alert and oriented to person, place, and time.  Psychiatric:        Behavior: Behavior normal.        Thought Content: Thought content normal.        Judgment: Judgment normal.      Lab Results  Component Value Date   WBC 2.2 (L) 03/19/2021   HGB 11.8 (L) 03/19/2021   HCT 33.9 (L) 03/19/2021   MCV 100.6 (H) 03/19/2021   PLT 286 03/19/2021   Lab Results  Component Value Date   FERRITIN 512 (H) 12/04/2020   IRON 96 12/04/2020   TIBC 244 12/04/2020   UIBC 147 12/04/2020   IRONPCTSAT 40 12/04/2020   Lab Results  Component Value Date   RBC 3.37 (L) 03/19/2021   Lab Results  Component Value Date   KAPLAMBRATIO 4.99 10/20/2020   Lab Results  Component Value Date   IGGSERUM 714 10/19/2020   IGA 153 10/19/2020   IGMSERUM 76 10/19/2020   Lab Results  Component Value Date   TOTALPROTELP 6.1 10/19/2020     Chemistry      Component Value Date/Time   NA 139 03/19/2021 0801   K 4.3 03/19/2021 0801   CL 104 03/19/2021 0801   CO2 28 03/19/2021 0801   BUN 10 03/19/2021 0801   CREATININE 0.71 03/19/2021 0801      Component Value Date/Time   CALCIUM 9.3 03/19/2021 0801   ALKPHOS 81 03/19/2021 0801   AST 16 03/19/2021 0801   ALT 8 03/19/2021 0801   BILITOT 0.5 03/19/2021 0801       Impression and Plan: Ms. Annette James is a very pleasant 60 yo caucasian female with metastatic breast cancer confined at this time to her bones.   I have so happy that she is responding.  I am not surprised by this.  Her quality of life is so much better right now.  I know she will be able to have a wonderful time when her grandson is born.  She continues on her fentanyl patches.  This  seems to be helping with respect to bony flareups.  I do not think we have to do any PET scans now probably till after Labor Day as long as she feels well and  we see the CA 27.29 stabilize or improve.  She is due for her Zometa in August.  We will plan to get her back in another month for her Faslodex.     Volanda Napoleon, MD 6/8/20228:50 AM

## 2021-03-19 NOTE — Patient Instructions (Signed)
Fulvestrant injection What is this medicine? FULVESTRANT (ful VES trant) blocks the effects of estrogen. It is used to treat breast cancer. This medicine may be used for other purposes; ask your health care provider or pharmacist if you have questions. COMMON BRAND NAME(S): FASLODEX What should I tell my health care provider before I take this medicine? They need to know if you have any of these conditions:  bleeding disorders  liver disease  low blood counts, like low white cell, platelet, or red cell counts  an unusual or allergic reaction to fulvestrant, other medicines, foods, dyes, or preservatives  pregnant or trying to get pregnant  breast-feeding How should I use this medicine? This medicine is for injection into a muscle. It is usually given by a health care professional in a hospital or clinic setting. Talk to your pediatrician regarding the use of this medicine in children. Special care may be needed. Overdosage: If you think you have taken too much of this medicine contact a poison control center or emergency room at once. NOTE: This medicine is only for you. Do not share this medicine with others. What if I miss a dose? It is important not to miss your dose. Call your doctor or health care professional if you are unable to keep an appointment. What may interact with this medicine?  medicines that treat or prevent blood clots like warfarin, enoxaparin, dalteparin, apixaban, dabigatran, and rivaroxaban This list may not describe all possible interactions. Give your health care provider a list of all the medicines, herbs, non-prescription drugs, or dietary supplements you use. Also tell them if you smoke, drink alcohol, or use illegal drugs. Some items may interact with your medicine. What should I watch for while using this medicine? Your condition will be monitored carefully while you are receiving this medicine. You will need important blood work done while you are taking  this medicine. Do not become pregnant while taking this medicine or for at least 1 year after stopping it. Women of child-bearing potential will need to have a negative pregnancy test before starting this medicine. Women should inform their doctor if they wish to become pregnant or think they might be pregnant. There is a potential for serious side effects to an unborn child. Men should inform their doctors if they wish to father a child. This medicine may lower sperm counts. Talk to your health care professional or pharmacist for more information. Do not breast-feed an infant while taking this medicine or for 1 year after the last dose. What side effects may I notice from receiving this medicine? Side effects that you should report to your doctor or health care professional as soon as possible:  allergic reactions like skin rash, itching or hives, swelling of the face, lips, or tongue  feeling faint or lightheaded, falls  pain, tingling, numbness, or weakness in the legs  signs and symptoms of infection like fever or chills; cough; flu-like symptoms; sore throat  vaginal bleeding Side effects that usually do not require medical attention (report to your doctor or health care professional if they continue or are bothersome):  aches, pains  constipation  diarrhea  headache  hot flashes  nausea, vomiting  pain at site where injected  stomach pain This list may not describe all possible side effects. Call your doctor for medical advice about side effects. You may report side effects to FDA at 1-800-FDA-1088. Where should I keep my medicine? This drug is given in a hospital or clinic and will   not be stored at home. NOTE: This sheet is a summary. It may not cover all possible information. If you have questions about this medicine, talk to your doctor, pharmacist, or health care provider.  2021 Elsevier/Gold Standard (2018-01-06 11:34:41)  

## 2021-03-19 NOTE — Progress Notes (Signed)
Oncology Nurse Navigator Documentation  Oncology Nurse Navigator Flowsheets 03/19/2021  Abnormal Finding Date -  Confirmed Diagnosis Date -  Diagnosis Status -  Planned Course of Treatment -  Phase of Treatment -  Chemotherapy Pending- Reason: -  Chemotherapy Actual Start Date: -  Navigator Follow Up Date: 04/18/2021  Navigator Follow Up Reason: Follow-up Appointment  Navigator Location [facility]  Navigator Encounter Type Appt/Treatment Plan Review  Telephone -  Treatment Initiated Date -  Patient Visit Type MedOnc  Treatment Phase Active Tx  Barriers/Navigation Needs Coordination of Care;Education;Morbidities/Frailty  Education -  Interventions None Required  Acuity Level 2-Minimal Needs (1-2 Barriers Identified)  Referrals -  Coordination of Care -  Education Method -  Support Groups/Services Friends and Family  Time Spent with Patient 15

## 2021-03-20 LAB — CANCER ANTIGEN 27.29: CA 27.29: 37.9 U/mL (ref 0.0–38.6)

## 2021-03-23 IMAGING — CT CT ANGIO CHEST
2 of 6 series · 18 of 46 positions shown · IV contrast (omnipaque)
Comparison: Chest radiograph earlier today. PET-CT 11/05/2020.
Chest CT 10/19/2020

CLINICAL DATA: PE suspected, high prob

Left flank pain.
Recent diagnosis of breast cancer with bony metastasis.
EXAM:
CT ANGIOGRAPHY CHEST WITH CONTRAST
TECHNIQUE: Multidetector CT imaging of the chest was performed using the
standard protocol during bolus administration of intravenous
contrast. Multiplanar CT image reconstructions and MIPs were
obtained to evaluate the vascular anatomy.
CONTRAST:  100mL OMNIPAQUE IOHEXOL 350 MG/ML SOLN

[Series 5: thins · axial · 0.71mm/px · z∈[+1416,+1662]mm · 15 of 270 slices shown]
[im 12/270  lung]
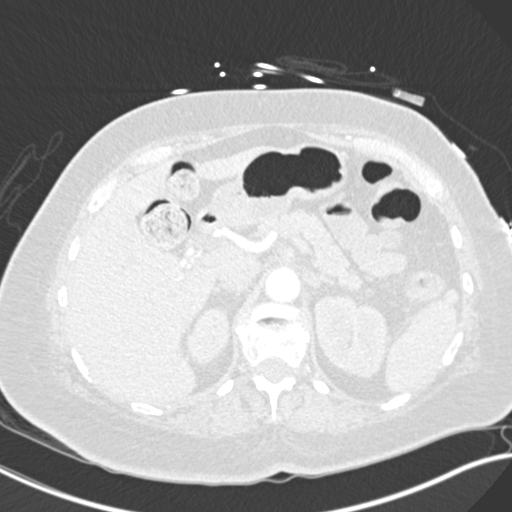
[im 36/270  soft-tissue]
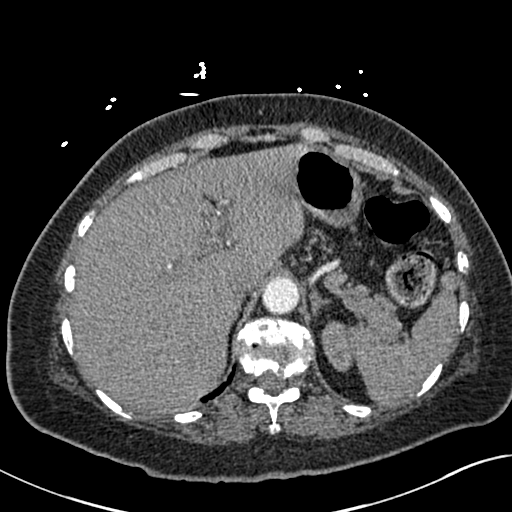
[im 47/270  lung]
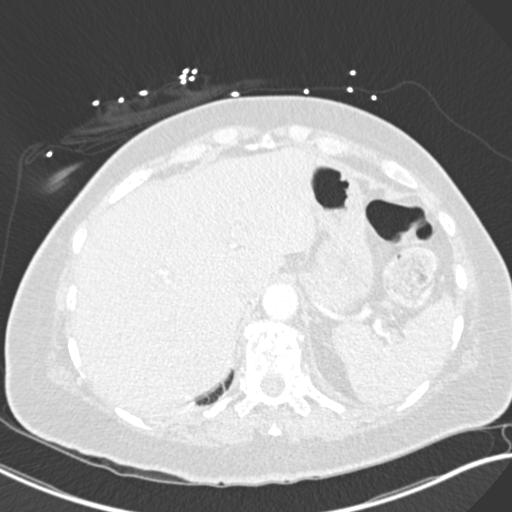
[im 71/270  soft-tissue]
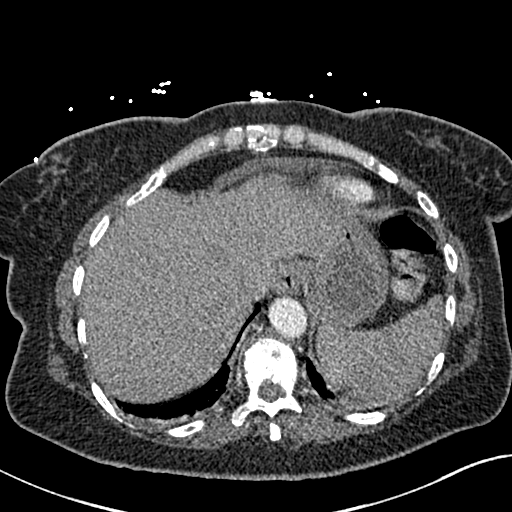
[im 82/270  lung]
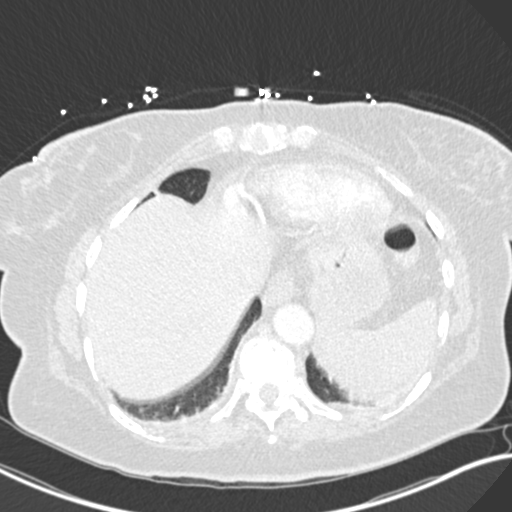
[im 106/270  soft-tissue]
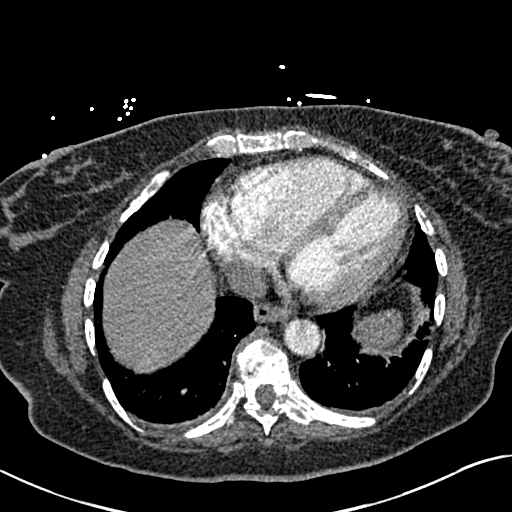
[im 117/270  lung]
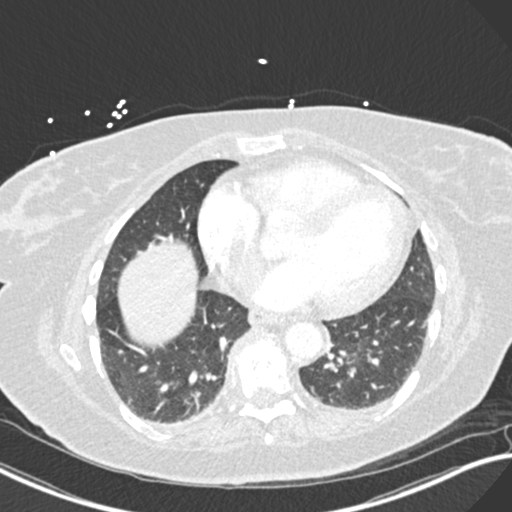
[im 141/270  soft-tissue]
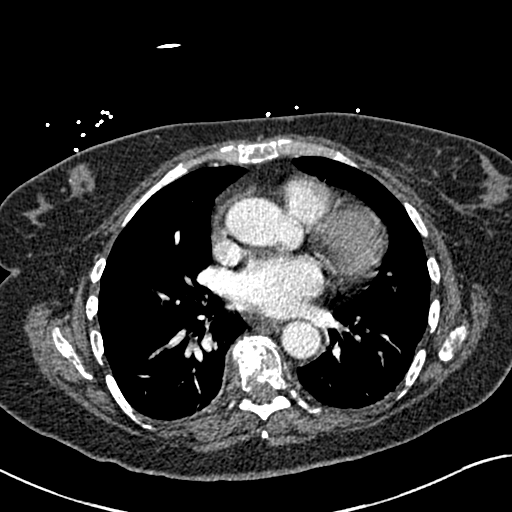
[im 153/270  lung]
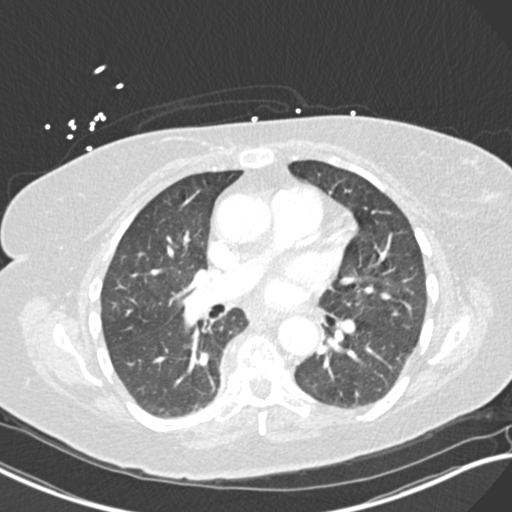
[im 164/270  soft-tissue]
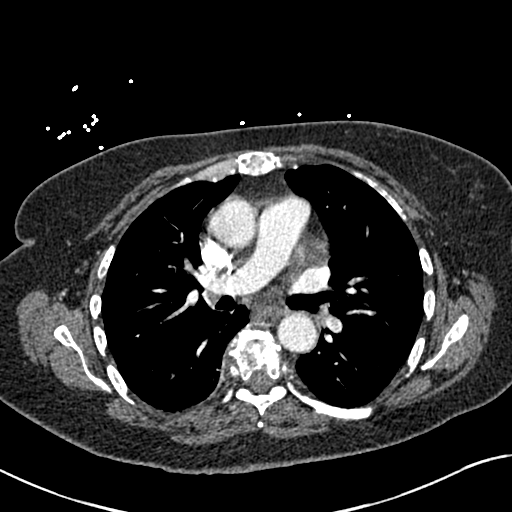
[im 188/270  lung]
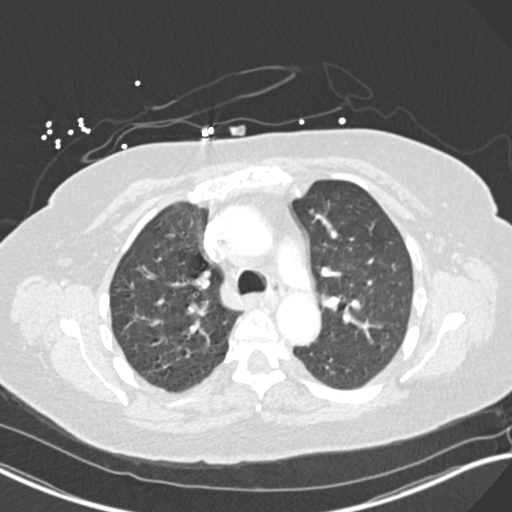
[im 199/270  soft-tissue]
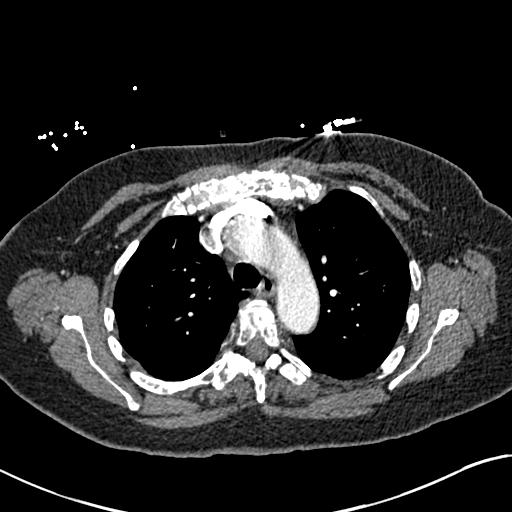
[im 223/270  lung]
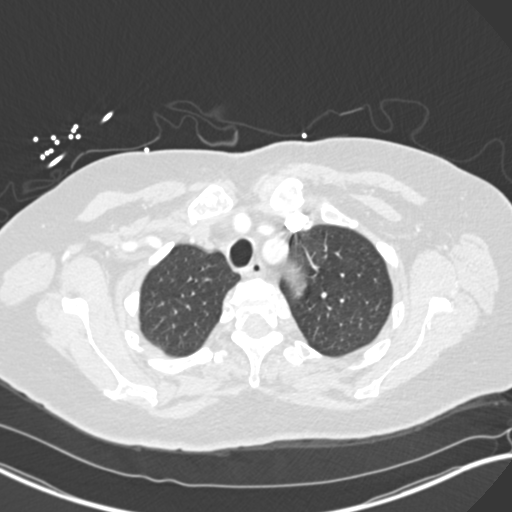
[im 234/270  soft-tissue]
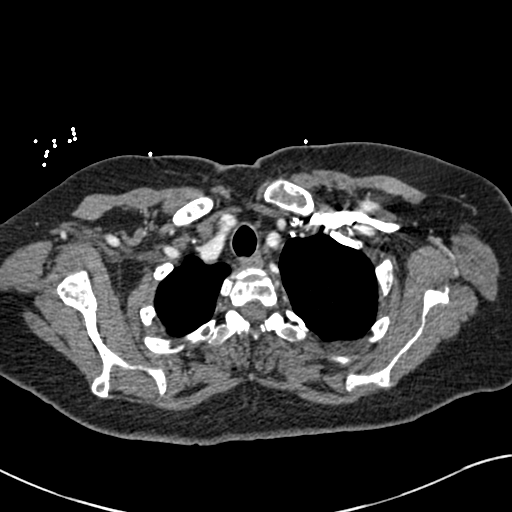
[im 258/270  lung]
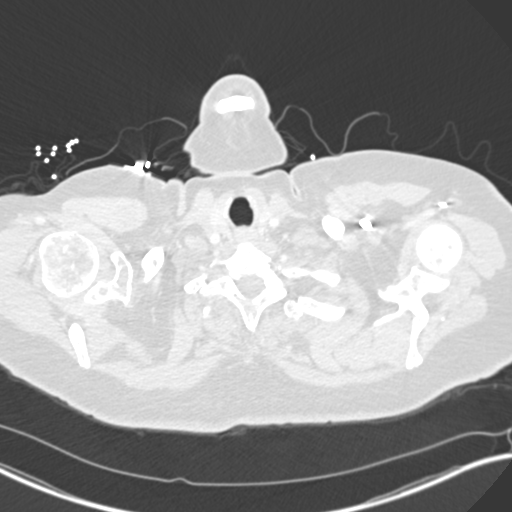

[Series 7: coronal mpr · coronal · 0.53mm/px · 3 of 133 slices shown]
[im 34/133  soft-tissue]
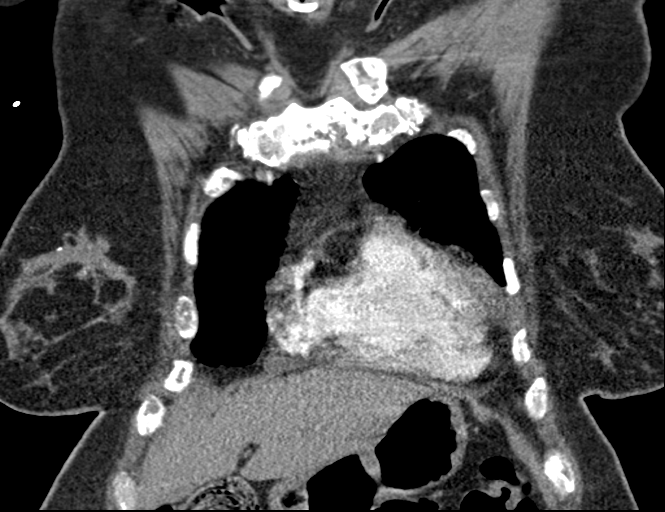
[im 67/133  soft-tissue]
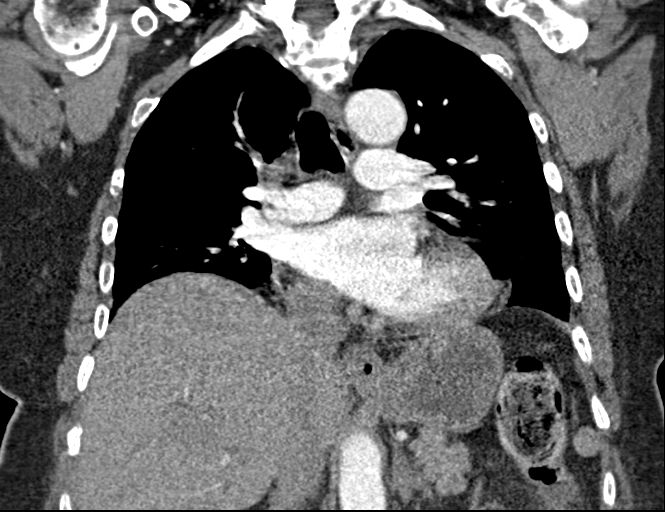
[im 100/133  soft-tissue]
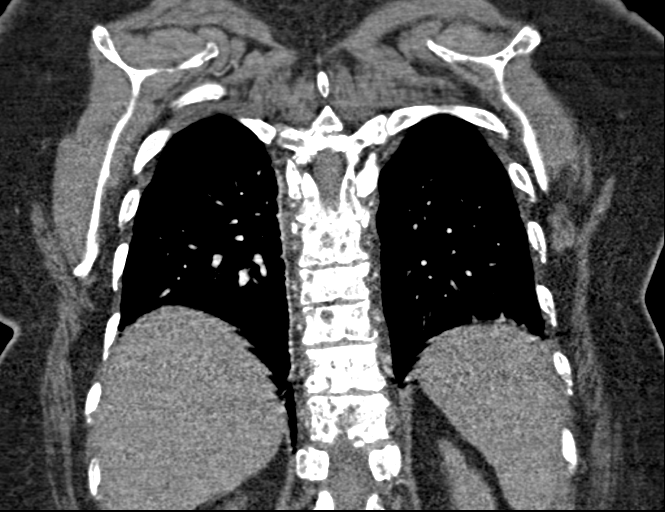

[18 of 46 positions shown; findings below may reference images not displayed]

FINDINGS: Cardiovascular: There are no filling defects within the pulmonary
arteries to suggest pulmonary embolus. Thoracic aorta is normal in
caliber. Conventional branching pattern from the aortic arch. No
dissection or acute aortic finding. Normal heart size. Trace
pericardial effusion measures up to 7 mm.

Mediastinum/Nodes: No enlarged mediastinal, hilar, or axillary
nodes. No esophageal wall thickening. No thyroid nodule.

Lungs/Pleura: Mild breathing motion artifact which limits
assessment, particularly at the lung bases. No acute airspace
disease. Trace right pleural effusion, similar to prior. No
pulmonary nodule. Mild central bronchial thickening.

Upper Abdomen: Assessed on concurrent abdominal CT, reported
separately.

Musculoskeletal: Known diffuse osseous metastatic disease with mixed
lytic and sclerotic lesions. Unchanged mild compression fractures of
T8 and T11. No displaced pathologic fracture. Right breast mass may
be slightly smaller largest dimension 2.5 cm, previously 3.3 cm.

Review of the MIP images confirms the above findings.
IMPRESSION: 1. No pulmonary embolus.
2. Mild central bronchial thickening, can be seen with bronchitis or
reactive airways disease.
3. Known diffuse osseous metastatic disease. Unchanged mild
compression fractures of T8 and T11. No evidence of acute or
pathologic fracture.
4. Right breast mass may be slightly smaller from prior exam.

## 2021-04-08 ENCOUNTER — Other Ambulatory Visit: Payer: Self-pay | Admitting: Hematology & Oncology

## 2021-04-08 DIAGNOSIS — C50919 Malignant neoplasm of unspecified site of unspecified female breast: Secondary | ICD-10-CM

## 2021-04-08 DIAGNOSIS — C7951 Secondary malignant neoplasm of bone: Secondary | ICD-10-CM

## 2021-04-08 MED ORDER — OXYCODONE HCL 5 MG PO TABS: ORAL_TABLET | ORAL | 0 refills | Status: DC | PRN

## 2021-04-08 MED ORDER — FENTANYL 50 MCG/HR TD PT72: 1.0000 | MEDICATED_PATCH | TRANSDERMAL | 0 refills | Status: DC

## 2021-04-17 ENCOUNTER — Other Ambulatory Visit (HOSPITAL_BASED_OUTPATIENT_CLINIC_OR_DEPARTMENT_OTHER): Payer: Self-pay

## 2021-04-17 MED FILL — Ondansetron HCl Tab 8 MG: ORAL | 24 days supply | Qty: 9 | Fill #3 | Status: AC

## 2021-04-18 ENCOUNTER — Encounter: Payer: Self-pay | Admitting: *Deleted

## 2021-04-18 ENCOUNTER — Inpatient Hospital Stay: Payer: BC Managed Care – PPO

## 2021-04-18 ENCOUNTER — Inpatient Hospital Stay (HOSPITAL_BASED_OUTPATIENT_CLINIC_OR_DEPARTMENT_OTHER): Payer: BC Managed Care – PPO | Admitting: Hematology & Oncology

## 2021-04-18 ENCOUNTER — Other Ambulatory Visit: Payer: Self-pay

## 2021-04-18 ENCOUNTER — Inpatient Hospital Stay: Payer: BC Managed Care – PPO | Attending: Hematology & Oncology

## 2021-04-18 VITALS — BP 142/88 | HR 74 | Temp 98.1°F | Resp 20 | Wt 209.8 lb

## 2021-04-18 DIAGNOSIS — C50919 Malignant neoplasm of unspecified site of unspecified female breast: Secondary | ICD-10-CM

## 2021-04-18 DIAGNOSIS — Z5111 Encounter for antineoplastic chemotherapy: Secondary | ICD-10-CM | POA: Diagnosis not present

## 2021-04-18 DIAGNOSIS — C7951 Secondary malignant neoplasm of bone: Secondary | ICD-10-CM

## 2021-04-18 DIAGNOSIS — Z170 Estrogen receptor positive status [ER+]: Secondary | ICD-10-CM | POA: Diagnosis not present

## 2021-04-18 LAB — CBC WITH DIFFERENTIAL (CANCER CENTER ONLY)
Abs Immature Granulocytes: 0 10*3/uL (ref 0.00–0.07)
Basophils Absolute: 0.1 10*3/uL (ref 0.0–0.1)
Basophils Relative: 3 %
Eosinophils Absolute: 0.1 10*3/uL (ref 0.0–0.5)
Eosinophils Relative: 3 %
HCT: 34.7 % — ABNORMAL LOW (ref 36.0–46.0)
Hemoglobin: 12.2 g/dL (ref 12.0–15.0)
Immature Granulocytes: 0 %
Lymphocytes Relative: 24 %
Lymphs Abs: 0.5 10*3/uL — ABNORMAL LOW (ref 0.7–4.0)
MCH: 36 pg — ABNORMAL HIGH (ref 26.0–34.0)
MCHC: 35.2 g/dL (ref 30.0–36.0)
MCV: 102.4 fL — ABNORMAL HIGH (ref 80.0–100.0)
Monocytes Absolute: 0.1 10*3/uL (ref 0.1–1.0)
Monocytes Relative: 6 %
Neutro Abs: 1.4 10*3/uL — ABNORMAL LOW (ref 1.7–7.7)
Neutrophils Relative %: 64 %
Platelet Count: 240 10*3/uL (ref 150–400)
RBC: 3.39 MIL/uL — ABNORMAL LOW (ref 3.87–5.11)
RDW: 12.8 % (ref 11.5–15.5)
WBC Count: 2.1 10*3/uL — ABNORMAL LOW (ref 4.0–10.5)
nRBC: 0 % (ref 0.0–0.2)

## 2021-04-18 LAB — CMP (CANCER CENTER ONLY)
ALT: 10 U/L (ref 0–44)
AST: 17 U/L (ref 15–41)
Albumin: 4.3 g/dL (ref 3.5–5.0)
Alkaline Phosphatase: 70 U/L (ref 38–126)
Anion gap: 6 (ref 5–15)
BUN: 12 mg/dL (ref 6–20)
CO2: 30 mmol/L (ref 22–32)
Calcium: 9.7 mg/dL (ref 8.9–10.3)
Chloride: 102 mmol/L (ref 98–111)
Creatinine: 0.71 mg/dL (ref 0.44–1.00)
GFR, Estimated: >60 mL/min (ref >60)
Glucose, Bld: 107 mg/dL — ABNORMAL HIGH (ref 70–99)
Potassium: 4 mmol/L (ref 3.5–5.1)
Sodium: 138 mmol/L (ref 135–145)
Total Bilirubin: 0.5 mg/dL (ref 0.3–1.2)
Total Protein: 6.5 g/dL (ref 6.5–8.1)

## 2021-04-18 LAB — LACTATE DEHYDROGENASE: LDH: 171 U/L (ref 98–192)

## 2021-04-18 MED ORDER — FULVESTRANT 250 MG/5ML IM SOLN
INTRAMUSCULAR | Status: AC
  Filled 2021-04-18: qty 10

## 2021-04-18 MED ORDER — FULVESTRANT 250 MG/5ML IM SOLN
500.0000 mg | Freq: Once | INTRAMUSCULAR | Status: AC
  Administered 2021-04-18: 500 mg via INTRAMUSCULAR

## 2021-04-18 NOTE — Progress Notes (Signed)
Hematology and Oncology Follow Up Visit  Aizley Stenseth [id number] 1961/02/15 60 y.o. 04/18/2021   Principle Diagnosis:  Metastatic breast cancer-bone only metastasis-ER positive/PR positive/HER2 negative   Current Therapy:        Faslodex 500 mg IM monthly-start on 11/06/2020 Ribociclib 600 mg p.o. daily (21 days on/7 days off)-start on 11/06/2020 Zometa 4 mg IV q. 3 months -- next dose on 05/2021   Interim History:  Ms. First is here today for follow-up.  She has had another grandchild.  This was a grandson.  She is quite excited about this.  Everybody is doing well.  She feels well.  She had no problems over the July 4 holiday.  She is doing okay on the ribociclib.  Pain does not seem to be a problem for her.  She is quite active.  Hopefully, she and her family will be going to the beach in August.  Her last CA 27.29 was 38 back in June.  She has had no problems with bowels or bladder.  There is been no issues with nausea or vomiting.  She has had no rashes.  There is no leg swelling.  She has had no cough or shortness of breath.  Overall, performance status is ECOG 0.    Medications:  Allergies as of 04/18/2021   No Known Allergies      Medication List        Accurate as of April 18, 2021  8:34 AM. If you have any questions, ask your nurse or doctor.          ascorbic acid 500 MG tablet Commonly known as: VITAMIN C Take 500 mg by mouth daily.   cyclobenzaprine 5 MG tablet Commonly known as: FLEXERIL Take 1 tablet (5 mg total) by mouth 3 (three) times daily as needed for muscle spasms.   dicyclomine 10 MG capsule Commonly known as: BENTYL Take 1 capsule (10 mg total) by mouth 3 (three) times daily between meals as needed for up to 30 doses for spasms.   fentaNYL 50 MCG/HR Commonly known as: [place] 1 patch onto the skin every other day.   Ibrance 125 MG tablet Generic drug: palbociclib TAKE 1 TABLET BY MOUTH ONCE DAILY WITH OR WITHOUT FOOD   FOR 21 DAYS, FOLLOWED BY 7  DAYS OFF, REPEATED EVERY 28 DAYS   LORazepam 0.5 MG tablet Commonly known as: ATIVAN TAKE 1 TABLET (0.5 MG TOTAL) BY MOUTH EVERY 8 (EIGHT) HOURS AS NEEDED FOR ANXIETY (NAUSEA).   ondansetron 8 MG tablet Commonly known as: ZOFRAN TAKE 1 TABLET (8 MG TOTAL) BY MOUTH EVERY 8 (EIGHT) HOURS AS NEEDED FOR NAUSEA OR VOMITING.   One-A-Day Womens 50 Plus Tabs Take 1 tablet by mouth daily.   oxyCODONE 5 MG immediate release tablet Commonly known as: Oxy IR/ROXICODONE TAKE 1-2 TABLETS BY MOUTH EVERY 4 HOURS AS NEEDED        Allergies: No Known Allergies  Past Medical History, Surgical history, Social history, and Family History were reviewed and updated.  Review of Systems: Review of Systems  Constitutional: Negative.   HENT: Negative.    Eyes: Negative.   Respiratory: Negative.    Cardiovascular: Negative.   Gastrointestinal: Negative.   Genitourinary: Negative.   Musculoskeletal:  Positive for joint pain and neck pain.  Skin: Negative.   Neurological: Negative.   Endo/Heme/Allergies: Negative.   Psychiatric/Behavioral: Negative.       Physical Exam:  vitals were not taken for this visit.   Wt Readings  from Last 3 Encounters:  03/19/21 211 lb 6.4 oz (95.9 kg)  02/19/21 219 lb (99.3 kg)  01/22/21 220 lb (99.8 kg)    Physical Exam Vitals reviewed.  HENT:     Head: Normocephalic and atraumatic.  Eyes:     Pupils: Pupils are equal, round, and reactive to light.  Cardiovascular:     Rate and Rhythm: Normal rate and regular rhythm.     Heart sounds: Normal heart sounds.  Pulmonary:     Effort: Pulmonary effort is normal.     Breath sounds: Normal breath sounds.  Abdominal:     General: Bowel sounds are normal.     Palpations: Abdomen is soft.  Musculoskeletal:        General: No tenderness or deformity. Normal range of motion.     Cervical back: Normal range of motion.  Lymphadenopathy:     Cervical: No cervical adenopathy.  Skin:     General: Skin is warm and dry.     Findings: No erythema or rash.  Neurological:     Mental Status: She is alert and oriented to person, place, and time.  Psychiatric:        Behavior: Behavior normal.        Thought Content: Thought content normal.        Judgment: Judgment normal.     Lab Results  Component Value Date   WBC 2.1 (L) 04/18/2021   HGB 12.2 04/18/2021   HCT 34.7 (L) 04/18/2021   MCV 102.4 (H) 04/18/2021   PLT 240 04/18/2021   Lab Results  Component Value Date   FERRITIN 512 (H) 12/04/2020   IRON 96 12/04/2020   TIBC 244 12/04/2020   UIBC 147 12/04/2020   IRONPCTSAT 40 12/04/2020   Lab Results  Component Value Date   RBC 3.39 (L) 04/18/2021   Lab Results  Component Value Date   KAPLAMBRATIO 4.99 10/20/2020   Lab Results  Component Value Date   IGGSERUM 714 10/19/2020   IGA 153 10/19/2020   IGMSERUM 76 10/19/2020   Lab Results  Component Value Date   TOTALPROTELP 6.1 10/19/2020     Chemistry      Component Value Date/Time   NA 139 03/19/2021 0801   K 4.3 03/19/2021 0801   CL 104 03/19/2021 0801   CO2 28 03/19/2021 0801   BUN 10 03/19/2021 0801   CREATININE 0.71 03/19/2021 0801      Component Value Date/Time   CALCIUM 9.3 03/19/2021 0801   ALKPHOS 81 03/19/2021 0801   AST 16 03/19/2021 0801   ALT 8 03/19/2021 0801   BILITOT 0.5 03/19/2021 0801       Impression and Plan: Ms. Grounds is a very pleasant 60 yo caucasian female with metastatic breast cancer confined at this time to her bones.  She was diagnosed back in December 2021.  Everything looks fantastic.  We will see what her CA 27.29 is.  Her last PET scan was done in late May.  I do not think we have to do another 1 probably till after Labor Day.  Everything is going well with her.  Her quality of life is doing well.  She is doing what she would like to do.  She is able to help out with her new grandson.  We will go ahead with her Faslodex today.  She is not due for Zometa  until next month.  We will see her back in 4 weeks.    Volanda Napoleon, MD  7/8/20228:34 AM

## 2021-04-18 NOTE — Patient Instructions (Signed)
Fulvestrant injection What is this medicine? FULVESTRANT (ful VES trant) blocks the effects of estrogen. It is used to treat breast cancer. This medicine may be used for other purposes; ask your health care provider or pharmacist if you have questions. COMMON BRAND NAME(S): FASLODEX What should I tell my health care provider before I take this medicine? They need to know if you have any of these conditions:  bleeding disorders  liver disease  low blood counts, like low white cell, platelet, or red cell counts  an unusual or allergic reaction to fulvestrant, other medicines, foods, dyes, or preservatives  pregnant or trying to get pregnant  breast-feeding How should I use this medicine? This medicine is for injection into a muscle. It is usually given by a health care professional in a hospital or clinic setting. Talk to your pediatrician regarding the use of this medicine in children. Special care may be needed. Overdosage: If you think you have taken too much of this medicine contact a poison control center or emergency room at once. NOTE: This medicine is only for you. Do not share this medicine with others. What if I miss a dose? It is important not to miss your dose. Call your doctor or health care professional if you are unable to keep an appointment. What may interact with this medicine?  medicines that treat or prevent blood clots like warfarin, enoxaparin, dalteparin, apixaban, dabigatran, and rivaroxaban This list may not describe all possible interactions. Give your health care provider a list of all the medicines, herbs, non-prescription drugs, or dietary supplements you use. Also tell them if you smoke, drink alcohol, or use illegal drugs. Some items may interact with your medicine. What should I watch for while using this medicine? Your condition will be monitored carefully while you are receiving this medicine. You will need important blood work done while you are taking  this medicine. Do not become pregnant while taking this medicine or for at least 1 year after stopping it. Women of child-bearing potential will need to have a negative pregnancy test before starting this medicine. Women should inform their doctor if they wish to become pregnant or think they might be pregnant. There is a potential for serious side effects to an unborn child. Men should inform their doctors if they wish to father a child. This medicine may lower sperm counts. Talk to your health care professional or pharmacist for more information. Do not breast-feed an infant while taking this medicine or for 1 year after the last dose. What side effects may I notice from receiving this medicine? Side effects that you should report to your doctor or health care professional as soon as possible:  allergic reactions like skin rash, itching or hives, swelling of the face, lips, or tongue  feeling faint or lightheaded, falls  pain, tingling, numbness, or weakness in the legs  signs and symptoms of infection like fever or chills; cough; flu-like symptoms; sore throat  vaginal bleeding Side effects that usually do not require medical attention (report to your doctor or health care professional if they continue or are bothersome):  aches, pains  constipation  diarrhea  headache  hot flashes  nausea, vomiting  pain at site where injected  stomach pain This list may not describe all possible side effects. Call your doctor for medical advice about side effects. You may report side effects to FDA at 1-800-FDA-1088. Where should I keep my medicine? This drug is given in a hospital or clinic and will   not be stored at home. NOTE: This sheet is a summary. It may not cover all possible information. If you have questions about this medicine, talk to your doctor, pharmacist, or health care provider.  2021 Elsevier/Gold Standard (2018-01-06 11:34:41)  

## 2021-04-18 NOTE — Progress Notes (Signed)
Patient is doing well. Her work has provided her with her work accommodations and states this has helped. She knows to reach out with any concerns or needs.   Oncology Nurse Navigator Documentation  Oncology Nurse Navigator Flowsheets 04/18/2021  Abnormal Finding Date -  Confirmed Diagnosis Date -  Diagnosis Status -  Planned Course of Treatment -  Phase of Treatment -  Chemotherapy Pending- Reason: -  Chemotherapy Actual Start Date: -  Navigator Follow Up Date: 05/16/2021  Navigator Follow Up Reason: Follow-up Appointment  Navigator Location [facility]  Navigator Encounter Type Follow-up Appt;Appt/Treatment Plan Review  Telephone -  Treatment Initiated Date -  Patient Visit Type MedOnc  Treatment Phase Active Tx  Barriers/Navigation Needs Morbidities/Frailty  Education -  Interventions Psycho-Social Support  Acuity Level 2-Minimal Needs (1-2 Barriers Identified)  Referrals -  Coordination of Care -  Education Method -  Support Groups/Services Friends and Family  Time Spent with Patient 15

## 2021-04-19 LAB — CANCER ANTIGEN 27.29: CA 27.29: 39.7 U/mL — ABNORMAL HIGH (ref 0.0–38.6)

## 2021-04-27 ENCOUNTER — Encounter: Payer: Self-pay | Admitting: *Deleted

## 2021-04-28 ENCOUNTER — Other Ambulatory Visit: Payer: Self-pay | Admitting: Internal Medicine

## 2021-04-28 ENCOUNTER — Encounter: Payer: Self-pay | Admitting: Internal Medicine

## 2021-04-28 DIAGNOSIS — U071 COVID-19: Secondary | ICD-10-CM

## 2021-04-28 DIAGNOSIS — J208 Acute bronchitis due to other specified organisms: Secondary | ICD-10-CM

## 2021-04-28 MED ORDER — PAXLOVID 20 X 150 MG & 10 X 100MG PO TBPK: 3.0000 | ORAL_TABLET | Freq: Two times a day (BID) | ORAL | 0 refills | Status: AC

## 2021-05-12 ENCOUNTER — Other Ambulatory Visit: Payer: Self-pay | Admitting: Hematology & Oncology

## 2021-05-12 ENCOUNTER — Other Ambulatory Visit (HOSPITAL_BASED_OUTPATIENT_CLINIC_OR_DEPARTMENT_OTHER): Payer: Self-pay

## 2021-05-12 DIAGNOSIS — C7951 Secondary malignant neoplasm of bone: Secondary | ICD-10-CM

## 2021-05-12 DIAGNOSIS — C50919 Malignant neoplasm of unspecified site of unspecified female breast: Secondary | ICD-10-CM

## 2021-05-12 MED FILL — Ondansetron HCl Tab 8 MG: ORAL | 24 days supply | Qty: 9 | Fill #4 | Status: AC

## 2021-05-13 ENCOUNTER — Encounter: Payer: Self-pay | Admitting: Hematology & Oncology

## 2021-05-13 MED ORDER — OXYCODONE HCL 5 MG PO TABS: ORAL_TABLET | ORAL | 0 refills | Status: DC | PRN

## 2021-05-13 MED ORDER — FENTANYL 50 MCG/HR TD PT72: 1.0000 | MEDICATED_PATCH | TRANSDERMAL | 0 refills | Status: DC

## 2021-05-16 ENCOUNTER — Inpatient Hospital Stay: Payer: BC Managed Care – PPO

## 2021-05-16 ENCOUNTER — Inpatient Hospital Stay: Payer: BC Managed Care – PPO | Attending: Hematology & Oncology

## 2021-05-16 ENCOUNTER — Other Ambulatory Visit: Payer: Self-pay

## 2021-05-16 ENCOUNTER — Encounter: Payer: Self-pay | Admitting: Hematology & Oncology

## 2021-05-16 ENCOUNTER — Inpatient Hospital Stay (HOSPITAL_BASED_OUTPATIENT_CLINIC_OR_DEPARTMENT_OTHER): Payer: BC Managed Care – PPO | Admitting: Hematology & Oncology

## 2021-05-16 VITALS — BP 166/83 | HR 69 | Temp 98.4°F | Resp 16 | Wt 204.0 lb

## 2021-05-16 DIAGNOSIS — C50919 Malignant neoplasm of unspecified site of unspecified female breast: Secondary | ICD-10-CM | POA: Diagnosis not present

## 2021-05-16 DIAGNOSIS — Z170 Estrogen receptor positive status [ER+]: Secondary | ICD-10-CM | POA: Insufficient documentation

## 2021-05-16 DIAGNOSIS — C7951 Secondary malignant neoplasm of bone: Secondary | ICD-10-CM | POA: Diagnosis not present

## 2021-05-16 DIAGNOSIS — Z5111 Encounter for antineoplastic chemotherapy: Secondary | ICD-10-CM | POA: Insufficient documentation

## 2021-05-16 LAB — CBC WITH DIFFERENTIAL (CANCER CENTER ONLY)
Abs Immature Granulocytes: 0.01 10*3/uL (ref 0.00–0.07)
Basophils Absolute: 0.1 10*3/uL (ref 0.0–0.1)
Basophils Relative: 3 %
Eosinophils Absolute: 0.1 10*3/uL (ref 0.0–0.5)
Eosinophils Relative: 2 %
HCT: 37 % (ref 36.0–46.0)
Hemoglobin: 13.2 g/dL (ref 12.0–15.0)
Immature Granulocytes: 0 %
Lymphocytes Relative: 27 %
Lymphs Abs: 0.8 10*3/uL (ref 0.7–4.0)
MCH: 35.8 pg — ABNORMAL HIGH (ref 26.0–34.0)
MCHC: 35.7 g/dL (ref 30.0–36.0)
MCV: 100.3 fL — ABNORMAL HIGH (ref 80.0–100.0)
Monocytes Absolute: 0.2 10*3/uL (ref 0.1–1.0)
Monocytes Relative: 6 %
Neutro Abs: 1.7 10*3/uL (ref 1.7–7.7)
Neutrophils Relative %: 62 %
Platelet Count: 239 10*3/uL (ref 150–400)
RBC: 3.69 MIL/uL — ABNORMAL LOW (ref 3.87–5.11)
RDW: 12.5 % (ref 11.5–15.5)
WBC Count: 2.8 10*3/uL — ABNORMAL LOW (ref 4.0–10.5)
nRBC: 0 % (ref 0.0–0.2)

## 2021-05-16 LAB — CMP (CANCER CENTER ONLY)
ALT: 9 U/L (ref 0–44)
AST: 15 U/L (ref 15–41)
Albumin: 4.5 g/dL (ref 3.5–5.0)
Alkaline Phosphatase: 101 U/L (ref 38–126)
Anion gap: 9 (ref 5–15)
BUN: 13 mg/dL (ref 6–20)
CO2: 27 mmol/L (ref 22–32)
Calcium: 9.9 mg/dL (ref 8.9–10.3)
Chloride: 102 mmol/L (ref 98–111)
Creatinine: 0.75 mg/dL (ref 0.44–1.00)
GFR, Estimated: >60 mL/min
Glucose, Bld: 99 mg/dL (ref 70–99)
Potassium: 4.3 mmol/L (ref 3.5–5.1)
Sodium: 138 mmol/L (ref 135–145)
Total Bilirubin: 0.6 mg/dL (ref 0.3–1.2)
Total Protein: 7 g/dL (ref 6.5–8.1)

## 2021-05-16 LAB — LACTATE DEHYDROGENASE: LDH: 196 U/L — ABNORMAL HIGH (ref 98–192)

## 2021-05-16 MED ORDER — FULVESTRANT 250 MG/5ML IM SOLN
500.0000 mg | Freq: Once | INTRAMUSCULAR | Status: AC
  Administered 2021-05-16: 500 mg via INTRAMUSCULAR

## 2021-05-16 MED ORDER — SODIUM CHLORIDE 0.9 % IV SOLN
INTRAVENOUS | Status: DC
  Filled 2021-05-16: qty 250

## 2021-05-16 MED ORDER — FULVESTRANT 250 MG/5ML IM SOLN
INTRAMUSCULAR | Status: AC
  Filled 2021-05-16: qty 10

## 2021-05-16 MED ORDER — ZOLEDRONIC ACID 4 MG/100ML IV SOLN
4.0000 mg | Freq: Once | INTRAVENOUS | Status: AC
  Administered 2021-05-16: 4 mg via INTRAVENOUS
  Filled 2021-05-16: qty 100

## 2021-05-16 NOTE — Progress Notes (Signed)
Hematology and Oncology Follow Up Visit  Annette James [id number] June 29, 1961 60 y.o. 05/16/2021   Principle Diagnosis:  Metastatic breast cancer-bone only metastasis-ER positive/PR positive/HER2 negative   Current Therapy:        Faslodex 500 mg IM monthly-start on 11/06/2020 Ribociclib 600 mg p.o. daily (21 days on/7 days off)-start on 11/06/2020 Zometa 4 mg IV q. 3 months -- next dose on 08/2021   Interim History:  Annette James is here today for follow-up.  She looks great.  She is still excited about her grandson.  He is 66 weeks old.  She show me a picture.  Her last CA 27.29 was 40.  This is holding pretty steady.  She is still working.  She is having no problems with fatigue or weakness.  Is no nausea or vomiting.  She has had no change in bowel or bladder habits.  There is no headache.  Her pain seems to be improving.  She is doing the [place] patch every 3 days.  She has had no bleeding.  There is no leg swelling.  She has had no cough or shortness of breath.  She has avoided the coronavirus.  She really has done well with the ribociclib.    Overall, I would say her performance status is ECOG 1.    Medications:  Allergies as of 05/16/2021   No Known Allergies      Medication List        Accurate as of May 16, 2021  2:33 PM. If you have any questions, ask your nurse or doctor.          STOP taking these medications    dicyclomine 10 MG capsule Commonly known as: BENTYL Stopped by: Volanda Napoleon, MD   LORazepam 0.5 MG tablet Commonly known as: ATIVAN Stopped by: Volanda Napoleon, MD       TAKE these medications    ascorbic acid 500 MG tablet Commonly known as: VITAMIN C Take 500 mg by mouth daily.   cyclobenzaprine 5 MG tablet Commonly known as: FLEXERIL Take 1 tablet (5 mg total) by mouth 3 (three) times daily as needed for muscle spasms.   fentaNYL 50 MCG/HR Commonly known as: [place] 1 patch onto the skin every other  day.   Ibrance 125 MG tablet Generic drug: palbociclib TAKE 1 TABLET BY MOUTH ONCE DAILY WITH OR WITHOUT FOOD  FOR 21 DAYS, FOLLOWED BY 7  DAYS OFF, REPEATED EVERY 28 DAYS   ondansetron 8 MG tablet Commonly known as: ZOFRAN TAKE 1 TABLET (8 MG TOTAL) BY MOUTH EVERY 8 (EIGHT) HOURS AS NEEDED FOR NAUSEA OR VOMITING.   One-A-Day Womens 50 Plus Tabs Take 1 tablet by mouth daily.   oxyCODONE 5 MG immediate release tablet Commonly known as: Oxy IR/ROXICODONE TAKE 1-2 TABLETS BY MOUTH EVERY 4 HOURS AS NEEDED        Allergies: No Known Allergies  Past Medical History, Surgical history, Social history, and Family History were reviewed and updated.  Review of Systems: Review of Systems  Constitutional: Negative.   HENT: Negative.    Eyes: Negative.   Respiratory: Negative.    Cardiovascular: Negative.   Gastrointestinal: Negative.   Genitourinary: Negative.   Musculoskeletal:  Positive for joint pain and neck pain.  Skin: Negative.   Neurological: Negative.   Endo/Heme/Allergies: Negative.   Psychiatric/Behavioral: Negative.       Physical Exam:  weight is 204 lb (92.5 kg). Her oral temperature is 98.4 F (36.9 C). Her  blood pressure is 166/83 (abnormal) and her pulse is 69. Her respiration is 16 and oxygen saturation is 99%.   Wt Readings from Last 3 Encounters:  05/16/21 204 lb (92.5 kg)  04/18/21 209 lb 12.8 oz (95.2 kg)  03/19/21 211 lb 6.4 oz (95.9 kg)    Physical Exam Vitals reviewed.  HENT:     Head: Normocephalic and atraumatic.  Eyes:     Pupils: Pupils are equal, round, and reactive to light.  Cardiovascular:     Rate and Rhythm: Normal rate and regular rhythm.     Heart sounds: Normal heart sounds.  Pulmonary:     Effort: Pulmonary effort is normal.     Breath sounds: Normal breath sounds.  Abdominal:     General: Bowel sounds are normal.     Palpations: Abdomen is soft.  Musculoskeletal:        General: No tenderness or deformity. Normal range of  motion.     Cervical back: Normal range of motion.  Lymphadenopathy:     Cervical: No cervical adenopathy.  Skin:    General: Skin is warm and dry.     Findings: No erythema or rash.  Neurological:     Mental Status: She is alert and oriented to person, place, and time.  Psychiatric:        Behavior: Behavior normal.        Thought Content: Thought content normal.        Judgment: Judgment normal.     Lab Results  Component Value Date   WBC 2.8 (L) 05/16/2021   HGB 13.2 05/16/2021   HCT 37.0 05/16/2021   MCV 100.3 (H) 05/16/2021   PLT 239 05/16/2021   Lab Results  Component Value Date   FERRITIN 512 (H) 12/04/2020   IRON 96 12/04/2020   TIBC 244 12/04/2020   UIBC 147 12/04/2020   IRONPCTSAT 40 12/04/2020   Lab Results  Component Value Date   RBC 3.69 (L) 05/16/2021   Lab Results  Component Value Date   KAPLAMBRATIO 4.99 10/20/2020   Lab Results  Component Value Date   IGGSERUM 714 10/19/2020   IGA 153 10/19/2020   IGMSERUM 76 10/19/2020   Lab Results  Component Value Date   TOTALPROTELP 6.1 10/19/2020     Chemistry      Component Value Date/Time   NA 138 05/16/2021 1312   K 4.3 05/16/2021 1312   CL 102 05/16/2021 1312   CO2 27 05/16/2021 1312   BUN 13 05/16/2021 1312   CREATININE 0.75 05/16/2021 1312      Component Value Date/Time   CALCIUM 9.9 05/16/2021 1312   ALKPHOS 101 05/16/2021 1312   AST 15 05/16/2021 1312   ALT 9 05/16/2021 1312   BILITOT 0.6 05/16/2021 1312       Impression and Plan: Annette James is a very pleasant 60 yo caucasian female with metastatic breast cancer confined at this time to her bones.  She was diagnosed back in December 2021.  Everything looks fantastic.  We will see what her CA 27.29 is.  Hopefully, it will be little bit lower.  I think we can probably do another PET scan on her in September.  She will get her Zometa today in addition to the Faslodex.  I am just happy that her quality of life is doing so well.   She is walking.  She is more active.  She is eating well.  Her blood pressure is up a little bit.  I  told her just to be careful with the salt intake.  I will plan to see her back in another month.   Volanda Napoleon, MD 8/5/20222:33 PM

## 2021-05-16 NOTE — Patient Instructions (Addendum)
Fulvestrant injection What is this medication? FULVESTRANT (ful VES trant) blocks the effects of estrogen. It is used to treatbreast cancer. This medicine may be used for other purposes; ask your health care provider orpharmacist if you have questions. COMMON BRAND NAME(S): FASLODEX What should I tell my care team before I take this medication? They need to know if you have any of these conditions: bleeding disorders liver disease low blood counts, like low white cell, platelet, or red cell counts an unusual or allergic reaction to fulvestrant, other medicines, foods, dyes, or preservatives pregnant or trying to get pregnant breast-feeding How should I use this medication? This medicine is for injection into a muscle. It is usually given by a healthcare professional in a hospital or clinic setting. Talk to your pediatrician regarding the use of this medicine in children.Special care may be needed. Overdosage: If you think you have taken too much of this medicine contact apoison control center or emergency room at once. NOTE: This medicine is only for you. Do not share this medicine with others. What if I miss a dose? It is important not to miss your dose. Call your doctor or health careprofessional if you are unable to keep an appointment. What may interact with this medication? medicines that treat or prevent blood clots like warfarin, enoxaparin, dalteparin, apixaban, dabigatran, and rivaroxaban This list may not describe all possible interactions. Give your health care provider a list of all the medicines, herbs, non-prescription drugs, or dietary supplements you use. Also tell them if you smoke, drink alcohol, or use illegaldrugs. Some items may interact with your medicine. What should I watch for while using this medication? Your condition will be monitored carefully while you are receiving this medicine. You will need important blood work done while you are taking thismedicine. Do not  become pregnant while taking this medicine or for at least 1 year after stopping it. Women of child-bearing potential will need to have a negative pregnancy test before starting this medicine. Women should inform their doctor if they wish to become pregnant or think they might be pregnant. There is a potential for serious side effects to an unborn child. Men should inform their doctors if they wish to father a child. This medicine may lower sperm counts. Talk to your health care professional or pharmacist for more information. Do not breast-feed an infant while taking this medicine or for 1 year after thelast dose. What side effects may I notice from receiving this medication? Side effects that you should report to your doctor or health care professionalas soon as possible: allergic reactions like skin rash, itching or hives, swelling of the face, lips, or tongue feeling faint or lightheaded, falls pain, tingling, numbness, or weakness in the legs signs and symptoms of infection like fever or chills; cough; flu-like symptoms; sore throat vaginal bleeding Side effects that usually do not require medical attention (report to yourdoctor or health care professional if they continue or are bothersome): aches, pains constipation diarrhea headache hot flashes nausea, vomiting pain at site where injected stomach pain This list may not describe all possible side effects. Call your doctor for medical advice about side effects. You may report side effects to FDA at1-800-FDA-1088. Where should I keep my medication? This drug is given in a hospital or clinic and will not be stored at home. NOTE: This sheet is a summary. It may not cover all possible information. If you have questions about this medicine, talk to your doctor, pharmacist, orhealth care provider.  2022 Elsevier/Gold Standard (2018-01-06 11:34:41) Zoledronic Acid Injection (Hypercalcemia, Oncology) What is this medication? ZOLEDRONIC ACID  (ZOE le dron ik AS id) slows calcium loss from bones. It high calcium levels in the blood from some kinds of cancer. It may be used in otherpeople at risk for bone loss. This medicine may be used for other purposes; ask your health care provider orpharmacist if you have questions. COMMON BRAND NAME(S): Zometa What should I tell my care team before I take this medication? They need to know if you have any of these conditions: cancer dehydration dental disease kidney disease liver disease low levels of calcium in the blood lung or breathing disease (asthma) receiving steroids like dexamethasone or prednisone an unusual or allergic reaction to zoledronic acid, other medicines, foods, dyes, or preservatives pregnant or trying to get pregnant breast-feeding How should I use this medication? This drug is injected into a vein. It is given by a health care provider in [place] or clinic setting. Talk to your health care provider about the use of this drug in children.Special care may be needed. Overdosage: If you think you have taken too much of this medicine contact apoison control center or emergency room at once. NOTE: This medicine is only for you. Do not share this medicine with others. What if I miss a dose? Keep appointments for follow-up doses. It is important not to miss your dose.Call your health care provider if you are unable to keep an appointment. What may interact with this medication? certain antibiotics given by injection NSAIDs, medicines for pain and inflammation, like ibuprofen or naproxen some diuretics like bumetanide, furosemide teriparatide thalidomide This list may not describe all possible interactions. Give your health care provider a list of all the medicines, herbs, non-prescription drugs, or dietary supplements you use. Also tell them if you smoke, drink alcohol, or use illegaldrugs. Some items may interact with your medicine. What should I watch for while using  this medication? Visit your health care provider for regular checks on your progress. It may besome time before you see the benefit from this drug. Some people who take this drug have severe bone, joint, or muscle pain. This drug may also increase your risk for jaw problems or a broken thigh bone. Tell your health care provider right away if you have severe pain in your jaw, bones, joints, or muscles. Tell you health care provider if you have any painthat does not go away or that gets worse. Tell your dentist and dental surgeon that you are taking this drug. You should not have major dental surgery while on this drug. See your dentist to have a dental exam and fix any dental problems before starting this drug. Take good care of your teeth while on this drug. Make sure you see your dentist forregular follow-up appointments. You should make sure you get enough calcium and vitamin D while you are taking this drug. Discuss the foods you eat and the vitamins you take with your healthcare provider. Check with your health care provider if you have severe diarrhea, nausea, and vomiting, or if you sweat a lot. The loss of too much body fluid may make itdangerous for you to take this drug. You may need blood work done while you are taking this drug. Do not become pregnant while taking this drug. Women should inform their health care provider if they wish to become pregnant or think they might be pregnant. There is potential for serious harm to an unborn child. Talk  to your healthcare provider for more information. What side effects may I notice from receiving this medication? Side effects that you should report to your doctor or health care provider assoon as possible: allergic reactions (skin rash, itching or hives; swelling of the face, lips, or tongue) bone pain infection (fever, chills, cough, sore throat, pain or trouble passing urine) jaw pain, especially after dental work joint pain kidney injury  (trouble passing urine or change in the amount of urine) low blood pressure (dizziness; feeling faint or lightheaded, falls; unusually weak or tired) low calcium levels (fast heartbeat; muscle cramps or pain; pain, tingling, or numbness in the hands or feet; seizures) low magnesium levels (fast, irregular heartbeat; muscle cramp or pain; muscle weakness; tremors; seizures) low red blood cell counts (trouble breathing; feeling faint; lightheaded, falls; unusually weak or tired) muscle pain redness, blistering, peeling, or loosening of the skin, including inside the mouth severe diarrhea swelling of the ankles, feet, hands trouble breathing Side effects that usually do not require medical attention (report to yourdoctor or health care provider if they continue or are bothersome): anxious constipation coughing depressed mood eye irritation, itching, or pain fever general ill feeling or flu-like symptoms nausea pain, redness, or irritation at site where injected trouble sleeping This list may not describe all possible side effects. Call your doctor for medical advice about side effects. You may report side effects to FDA at1-800-FDA-1088. Where should I keep my medication? This drug is given in a hospital or clinic. It will not be stored at home. NOTE: This sheet is a summary. It may not cover all possible information. If you have questions about this medicine, talk to your doctor, pharmacist, orhealth care provider.  2022 Elsevier/Gold Standard (2019-07-13 09:13:00) [facility]  Discharge Instructions: Thank you for choosing [facility] to provide your oncology and hematology care.   If you have a lab appointment with the [facility], please go directly to the [facility] and check in at the registration area.  Wear comfortable clothing and clothing appropriate for easy access to any Portacath or PICC line.   We strive to give you quality  time with your provider. You may need to reschedule your appointment if you arrive late (15 or more minutes).  Arriving late affects you and other patients whose appointments are after yours.  Also, if you miss three or more appointments without notifying the office, you may be dismissed from the clinic at the provider's discretion.      For prescription refill requests, have your pharmacy contact our office and allow 72 hours for refills to be completed.    Today you received the following chemotherapy and/or immunotherapy agents zometa      To help prevent nausea and vomiting after your treatment, we encourage you to take your nausea medication as directed.  BELOW ARE SYMPTOMS THAT SHOULD BE REPORTED IMMEDIATELY: *FEVER GREATER THAN 100.4 F (38 C) OR HIGHER *CHILLS OR SWEATING *NAUSEA AND VOMITING THAT IS NOT CONTROLLED WITH YOUR NAUSEA MEDICATION *UNUSUAL SHORTNESS OF BREATH *UNUSUAL BRUISING OR BLEEDING *URINARY PROBLEMS (pain or burning when urinating, or frequent urination) *BOWEL PROBLEMS (unusual diarrhea, constipation, pain near the anus) TENDERNESS IN MOUTH AND THROAT WITH OR WITHOUT PRESENCE OF ULCERS (sore throat, sores in mouth, or a toothache) UNUSUAL RASH, SWELLING OR PAIN  UNUSUAL VAGINAL DISCHARGE OR ITCHING   Items with * indicate a potential emergency and should be followed up as soon as possible or go  to the Emergency Department if any problems should occur.  Please show the CHEMOTHERAPY ALERT CARD or IMMUNOTHERAPY ALERT CARD at check-in to the Emergency Department and triage nurse. Should you have questions after your visit or need to cancel or reschedule your appointment, please contact [facility]  [phone] and follow the prompts.  Office hours are 8:00 a.m. to 4:30 p.m. Monday - Friday. Please note that voicemails left after 4:00 p.m. may not be returned until the following business day.  We are closed weekends and major holidays. You  have access to a nurse at all times for urgent questions. Please call the main number to the clinic [phone] and follow the prompts.  For any non-urgent questions, you may also contact your provider using MyChart. We now offer e-Visits for anyone 50 and older to request care online for non-urgent symptoms. For details visit [email or web address].   Also download the MyChart app! Go to the app store, search "MyChart", open the app, select [facility], and log in with your MyChart username and password.  Due to Covid, a mask is required upon entering the hospital/clinic. If you do not have a mask, one will be given to you upon arrival. For doctor visits, patients may have 1 support person aged 59 or older with them. For treatment visits, patients cannot have anyone with them due to current Covid guidelines and our immunocompromised population.

## 2021-05-19 ENCOUNTER — Encounter: Payer: Self-pay | Admitting: *Deleted

## 2021-05-20 ENCOUNTER — Encounter: Payer: Self-pay | Admitting: *Deleted

## 2021-05-20 ENCOUNTER — Other Ambulatory Visit: Payer: Self-pay | Admitting: Family

## 2021-05-20 DIAGNOSIS — C50919 Malignant neoplasm of unspecified site of unspecified female breast: Secondary | ICD-10-CM

## 2021-05-20 DIAGNOSIS — C7951 Secondary malignant neoplasm of bone: Secondary | ICD-10-CM

## 2021-05-20 LAB — CANCER ANTIGEN 27.29: CA 27.29: 59 U/mL — ABNORMAL HIGH (ref 0.0–38.6)

## 2021-05-20 NOTE — Progress Notes (Signed)
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 Dr Marin Olp would like to continue therapy as ordered until he sees her again at her follow up appointment. Dr Marin Olp will order a PET at that appointment and he will determine response from those results.   Relayed that response to patient via [facility].   Oncology Nurse Navigator Documentation  Oncology Nurse Navigator Flowsheets 05/20/2021  Abnormal Finding Date -  Confirmed Diagnosis Date -  Diagnosis Status -  Planned Course of Treatment -  Phase of Treatment -  Chemotherapy Pending- Reason: -  Chemotherapy Actual Start Date: -  Navigator Follow Up Date: 06/17/2021  Navigator Follow Up Reason: Follow-up Appointment;Chemotherapy  Navigator Location [facility]  Navigator Encounter Type Appt/Treatment Plan Review;MyChart  Telephone -  Treatment Initiated Date -  Patient Visit Type MedOnc  Treatment Phase Active Tx  Barriers/Navigation Needs Coordination of Care;Education  Education Other  Interventions Education;Psycho-Social Support;Coordination of Care  Acuity Level 2-Minimal Needs (1-2 Barriers Identified)  Referrals -  Coordination of Care Other  Education Method Written  Support Groups/Services Friends and Family  Time Spent with Patient 15

## 2021-05-21 ENCOUNTER — Encounter: Payer: Self-pay | Admitting: *Deleted

## 2021-05-21 NOTE — Progress Notes (Signed)
Per MyChart discussion, Dr Marin Olp ordered PET to be done prior to next appointment. PET scheduled for 06/02/21. Sent patient MyChart message with information regarding her appointment, and PET preparation.   Oncology Nurse Navigator Documentation  Oncology Nurse Navigator Flowsheets 05/21/2021  Abnormal Finding Date -  Confirmed Diagnosis Date -  Diagnosis Status -  Planned Course of Treatment -  Phase of Treatment -  Chemotherapy Pending- Reason: -  Chemotherapy Actual Start Date: -  Navigator Follow Up Date: 06/02/2021  Navigator Follow Up Reason: Scan Review  Navigator Location [facility]  Navigator Encounter Type Appt/Treatment Plan Review  Telephone -  Treatment Initiated Date -  Patient Visit Type MedOnc  Treatment Phase Active Tx  Barriers/Navigation Needs Coordination of Care;Education  Education Other  Interventions Coordination of Care;Education;Psycho-Social Support  Acuity Level 2-Minimal Needs (1-2 Barriers Identified)  Referrals -  Coordination of Care Radiology  Education Method Written  Support Groups/Services Friends and Family  Time Spent with Patient 30

## 2021-06-02 ENCOUNTER — Other Ambulatory Visit: Payer: Self-pay

## 2021-06-02 ENCOUNTER — Ambulatory Visit (HOSPITAL_COMMUNITY)
Admission: RE | Admit: 2021-06-02 | Discharge: 2021-06-02 | Disposition: A | Discharge status: Home / Self Care | Payer: BC Managed Care – PPO | Source: Ambulatory Visit | Attending: Family | Admitting: Family

## 2021-06-02 DIAGNOSIS — C7951 Secondary malignant neoplasm of bone: Secondary | ICD-10-CM | POA: Diagnosis not present

## 2021-06-02 DIAGNOSIS — C50919 Malignant neoplasm of unspecified site of unspecified female breast: Secondary | ICD-10-CM | POA: Insufficient documentation

## 2021-06-02 DIAGNOSIS — Z9049 Acquired absence of other specified parts of digestive tract: Secondary | ICD-10-CM | POA: Diagnosis not present

## 2021-06-02 LAB — GLUCOSE, CAPILLARY: Glucose-Capillary: 81 mg/dL (ref 70–99)

## 2021-06-02 MED ORDER — FLUDEOXYGLUCOSE F - 18 (FDG) INJECTION
10.1000 | Freq: Once | INTRAVENOUS | Status: AC
  Administered 2021-06-02: 10.1 via INTRAVENOUS

## 2021-06-03 ENCOUNTER — Encounter: Payer: Self-pay | Admitting: Hematology & Oncology

## 2021-06-03 ENCOUNTER — Encounter: Payer: Self-pay | Admitting: *Deleted

## 2021-06-03 NOTE — Progress Notes (Signed)
Reviewed results with Dr Marin Olp and responded to patient's MyChart message.   Oncology Nurse Navigator Documentation  Oncology Nurse Navigator Flowsheets 06/03/2021  Abnormal Finding Date -  Confirmed Diagnosis Date -  Diagnosis Status -  Planned Course of Treatment -  Phase of Treatment -  Chemotherapy Pending- Reason: -  Chemotherapy Actual Start Date: -  Navigator Follow Up Date: 06/17/2021  Navigator Follow Up Reason: Follow-up Appointment  Navigator Location [facility]  Navigator Encounter Type Scan Review;MyChart  Telephone -  Treatment Initiated Date -  Patient Visit Type MedOnc  Treatment Phase Active Tx  Barriers/Navigation Needs Coordination of Care;Education  Education Other  Interventions Education  Acuity Level 2-Minimal Needs (1-2 Barriers Identified)  Referrals -  Coordination of Care -  Education Method Written  Support Groups/Services Friends and Family  Time Spent with Patient 15

## 2021-06-06 ENCOUNTER — Other Ambulatory Visit: Payer: Self-pay | Admitting: Hematology & Oncology

## 2021-06-06 DIAGNOSIS — C50919 Malignant neoplasm of unspecified site of unspecified female breast: Secondary | ICD-10-CM

## 2021-06-06 DIAGNOSIS — C7951 Secondary malignant neoplasm of bone: Secondary | ICD-10-CM

## 2021-06-09 ENCOUNTER — Encounter: Payer: Self-pay | Admitting: Hematology & Oncology

## 2021-06-09 MED ORDER — OXYCODONE HCL 5 MG PO TABS
ORAL_TABLET | ORAL | 0 refills | Status: DC | PRN
Start: 2021-06-09 — End: 2021-07-10

## 2021-06-09 NOTE — Telephone Encounter (Signed)
Please advise for refills. Last refilled 05/13/21 # 60. Thank you

## 2021-06-13 ENCOUNTER — Other Ambulatory Visit (HOSPITAL_BASED_OUTPATIENT_CLINIC_OR_DEPARTMENT_OTHER): Payer: Self-pay

## 2021-06-13 MED FILL — Ondansetron HCl Tab 8 MG: ORAL | 24 days supply | Qty: 9 | Fill #5 | Status: AC

## 2021-06-17 ENCOUNTER — Telehealth: Payer: Self-pay

## 2021-06-17 ENCOUNTER — Other Ambulatory Visit: Payer: Self-pay

## 2021-06-17 ENCOUNTER — Inpatient Hospital Stay: Payer: BC Managed Care – PPO

## 2021-06-17 ENCOUNTER — Encounter: Payer: Self-pay | Admitting: Hematology & Oncology

## 2021-06-17 ENCOUNTER — Encounter: Payer: Self-pay | Admitting: *Deleted

## 2021-06-17 ENCOUNTER — Inpatient Hospital Stay (HOSPITAL_BASED_OUTPATIENT_CLINIC_OR_DEPARTMENT_OTHER): Payer: BC Managed Care – PPO | Admitting: Hematology & Oncology

## 2021-06-17 ENCOUNTER — Inpatient Hospital Stay: Payer: BC Managed Care – PPO | Attending: Hematology & Oncology

## 2021-06-17 VITALS — BP 158/87 | HR 62 | Temp 98.6°F | Resp 18 | Wt 201.2 lb

## 2021-06-17 DIAGNOSIS — C7951 Secondary malignant neoplasm of bone: Secondary | ICD-10-CM | POA: Diagnosis not present

## 2021-06-17 DIAGNOSIS — C50919 Malignant neoplasm of unspecified site of unspecified female breast: Secondary | ICD-10-CM

## 2021-06-17 DIAGNOSIS — Z170 Estrogen receptor positive status [ER+]: Secondary | ICD-10-CM | POA: Diagnosis not present

## 2021-06-17 DIAGNOSIS — Z5111 Encounter for antineoplastic chemotherapy: Secondary | ICD-10-CM | POA: Insufficient documentation

## 2021-06-17 LAB — CBC WITH DIFFERENTIAL (CANCER CENTER ONLY)
Abs Immature Granulocytes: 0.01 10*3/uL (ref 0.00–0.07)
Basophils Absolute: 0.1 10*3/uL (ref 0.0–0.1)
Basophils Relative: 2 %
Eosinophils Absolute: 0.1 10*3/uL (ref 0.0–0.5)
Eosinophils Relative: 2 %
HCT: 35 % — ABNORMAL LOW (ref 36.0–46.0)
Hemoglobin: 12.1 g/dL (ref 12.0–15.0)
Immature Granulocytes: 0 %
Lymphocytes Relative: 30 %
Lymphs Abs: 0.8 10*3/uL (ref 0.7–4.0)
MCH: 35.6 pg — ABNORMAL HIGH (ref 26.0–34.0)
MCHC: 34.6 g/dL (ref 30.0–36.0)
MCV: 102.9 fL — ABNORMAL HIGH (ref 80.0–100.0)
Monocytes Absolute: 0.2 10*3/uL (ref 0.1–1.0)
Monocytes Relative: 7 %
Neutro Abs: 1.6 10*3/uL — ABNORMAL LOW (ref 1.7–7.7)
Neutrophils Relative %: 59 %
Platelet Count: 243 10*3/uL (ref 150–400)
RBC: 3.4 MIL/uL — ABNORMAL LOW (ref 3.87–5.11)
RDW: 13.1 % (ref 11.5–15.5)
WBC Count: 2.7 10*3/uL — ABNORMAL LOW (ref 4.0–10.5)
nRBC: 0 % (ref 0.0–0.2)

## 2021-06-17 LAB — CMP (CANCER CENTER ONLY)
ALT: 9 U/L (ref 0–44)
AST: 17 U/L (ref 15–41)
Albumin: 4.4 g/dL (ref 3.5–5.0)
Alkaline Phosphatase: 66 U/L (ref 38–126)
Anion gap: 8 (ref 5–15)
BUN: 11 mg/dL (ref 6–20)
CO2: 29 mmol/L (ref 22–32)
Calcium: 9.8 mg/dL (ref 8.9–10.3)
Chloride: 101 mmol/L (ref 98–111)
Creatinine: 0.71 mg/dL (ref 0.44–1.00)
GFR, Estimated: >60 mL/min (ref >60)
Glucose, Bld: 94 mg/dL (ref 70–99)
Potassium: 3.9 mmol/L (ref 3.5–5.1)
Sodium: 138 mmol/L (ref 135–145)
Total Bilirubin: 0.5 mg/dL (ref 0.3–1.2)
Total Protein: 6.7 g/dL (ref 6.5–8.1)

## 2021-06-17 LAB — LACTATE DEHYDROGENASE: LDH: 182 U/L (ref 98–192)

## 2021-06-17 MED ORDER — FULVESTRANT 250 MG/5ML IM SOLN
500.0000 mg | Freq: Once | INTRAMUSCULAR | Status: AC
  Administered 2021-06-17: 500 mg via INTRAMUSCULAR
  Filled 2021-06-17: qty 10

## 2021-06-17 NOTE — Progress Notes (Signed)
Patient will need referral to RadOnc for local control of activity to Right hip. Referral order placed.   Oncology Nurse Navigator Documentation  Oncology Nurse Navigator Flowsheets 06/17/2021  Abnormal Finding Date -  Confirmed Diagnosis Date -  Diagnosis Status -  Planned Course of Treatment -  Phase of Treatment -  Chemotherapy Pending- Reason: -  Chemotherapy Actual Start Date: -  Navigator Follow Up Date: 07/16/2021  Navigator Follow Up Reason: Follow-up Appointment  Navigator Location [facility]  Navigator Encounter Type Appt/Treatment Plan Review  Telephone -  Treatment Initiated Date -  Patient Visit Type MedOnc  Treatment Phase Active Tx  Barriers/Navigation Needs Coordination of Care;Education  Education -  Interventions Referrals  Acuity Level 2-Minimal Needs (1-2 Barriers Identified)  Referrals Radiation Oncology  Coordination of Care -  Education Method -  Support Groups/Services Friends and Family  Time Spent with Patient 15

## 2021-06-17 NOTE — Patient Instructions (Signed)
Fulvestrant injection What is this medication? FULVESTRANT (ful VES trant) blocks the effects of estrogen. It is used to treat breast cancer. This medicine may be used for other purposes; ask your health care provider or pharmacist if you have questions. COMMON BRAND NAME(S): FASLODEX What should I tell my care team before I take this medication? They need to know if you have any of these conditions: bleeding disorders liver disease low blood counts, like low white cell, platelet, or red cell counts an unusual or allergic reaction to fulvestrant, other medicines, foods, dyes, or preservatives pregnant or trying to get pregnant breast-feeding How should I use this medication? This medicine is for injection into a muscle. It is usually given by a health care professional in a hospital or clinic setting. Talk to your pediatrician regarding the use of this medicine in children. Special care may be needed. Overdosage: If you think you have taken too much of this medicine contact a poison control center or emergency room at once. NOTE: This medicine is only for you. Do not share this medicine with others. What if I miss a dose? It is important not to miss your dose. Call your doctor or health care professional if you are unable to keep an appointment. What may interact with this medication? medicines that treat or prevent blood clots like warfarin, enoxaparin, dalteparin, apixaban, dabigatran, and rivaroxaban This list may not describe all possible interactions. Give your health care provider a list of all the medicines, herbs, non-prescription drugs, or dietary supplements you use. Also tell them if you smoke, drink alcohol, or use illegal drugs. Some items may interact with your medicine. What should I watch for while using this medication? Your condition will be monitored carefully while you are receiving this medicine. You will need important blood work done while you are taking this  medicine. Do not become pregnant while taking this medicine or for at least 1 year after stopping it. Women of child-bearing potential will need to have a negative pregnancy test before starting this medicine. Women should inform their doctor if they wish to become pregnant or think they might be pregnant. There is a potential for serious side effects to an unborn child. Men should inform their doctors if they wish to father a child. This medicine may lower sperm counts. Talk to your health care professional or pharmacist for more information. Do not breast-feed an infant while taking this medicine or for 1 year after the last dose. What side effects may I notice from receiving this medication? Side effects that you should report to your doctor or health care professional as soon as possible: allergic reactions like skin rash, itching or hives, swelling of the face, lips, or tongue feeling faint or lightheaded, falls pain, tingling, numbness, or weakness in the legs signs and symptoms of infection like fever or chills; cough; flu-like symptoms; sore throat vaginal bleeding Side effects that usually do not require medical attention (report to your doctor or health care professional if they continue or are bothersome): aches, pains constipation diarrhea headache hot flashes nausea, vomiting pain at site where injected stomach pain This list may not describe all possible side effects. Call your doctor for medical advice about side effects. You may report side effects to FDA at 1-800-FDA-1088. Where should I keep my medication? This drug is given in a hospital or clinic and will not be stored at home. NOTE: This sheet is a summary. It may not cover all possible information. If you have   questions about this medicine, talk to your doctor, pharmacist, or health care provider.  2022 Elsevier/Gold Standard (2018-01-06 11:34:41)  

## 2021-06-17 NOTE — Progress Notes (Signed)
Hematology and Oncology Follow Up Visit  Annette James [id number] 08-Nov-1960 60 y.o. 06/17/2021   Principle Diagnosis:  Metastatic breast cancer-bone only metastasis-ER positive/PR positive/HER2 LOW ///  PIK3CA (+)   Current Therapy:        Faslodex 500 mg IM monthly-start on 11/06/2020 Ribociclib 600 mg p.o. daily (21 days on/7 days off)-start on 11/06/2020 Zometa 4 mg IV q. 3 months -- next dose on 08/2021   Interim History:  Annette James is here today for follow-up.  She looks great.  She does have a wonderful time at the beach.  They were at the [facility].  That a wonderful time.  She showed me a picture of a hammer head shark that she caught from the beach.  I must say I am incredibly impressed by this.  She did have a recent PET scan done.  The PET scan showed that she had a nice response with her bony metastasis.  However, there was noted to be is some increase in foci in the right hip.  Her last CA 27.29 was up to 59.  This is a little bit troublesome.  Of note, there was similar activity in the right breast.  The activity was very low with an SUV of 2.6.  I think that radiation therapy would not be a bad idea for local control in the right hip area.  I would hate to make a change in therapy on her just for 1 area. She is in agreement with this.    She is still working.  She works from home.  She has a team of workers that work with her.  I am very impressed by this.  She really loves to work.  She obviously is very good to do what she does.  Her pain control is doing quite well.  She is on a Duragesic patch.  She takes oxycodone every once in a while.  She has had no problems with bowels or bladder.  She has had no issues with rashes.  There is been no leg swelling.  She has had no cough or shortness of breath.  She has had no headache.  Overall, her performance status is ECOG 0.    Medications:  Allergies as of 06/17/2021   No Known Allergies       Medication List        Accurate as of June 17, 2021  2:29 PM. If you have any questions, ask your nurse or doctor.          ascorbic acid 500 MG tablet Commonly known as: VITAMIN C Take 500 mg by mouth daily.   cyclobenzaprine 5 MG tablet Commonly known as: FLEXERIL TAKE 1 TABLET BY MOUTH THREE TIMES A DAY AS NEEDED FOR MUSCLE SPASMS   fentaNYL 50 MCG/HR Commonly known as: [place] 1 patch onto the skin every other day.   Ibrance 125 MG tablet Generic drug: palbociclib TAKE 1 TABLET BY MOUTH ONCE DAILY WITH OR WITHOUT FOOD  FOR 21 DAYS, FOLLOWED BY 7  DAYS OFF, REPEATED EVERY 28 DAYS   ondansetron 8 MG tablet Commonly known as: ZOFRAN TAKE 1 TABLET (8 MG TOTAL) BY MOUTH EVERY 8 (EIGHT) HOURS AS NEEDED FOR NAUSEA OR VOMITING.   One-A-Day Womens 50 Plus Tabs Take 1 tablet by mouth daily.   oxyCODONE 5 MG immediate release tablet Commonly known as: Oxy IR/ROXICODONE TAKE 1-2 TABLETS BY MOUTH EVERY 4 HOURS AS NEEDED        Allergies:  No Known Allergies  Past Medical History, Surgical history, Social history, and Family History were reviewed and updated.  Review of Systems: Review of Systems  Constitutional: Negative.   HENT: Negative.    Eyes: Negative.   Respiratory: Negative.    Cardiovascular: Negative.   Gastrointestinal: Negative.   Genitourinary: Negative.   Musculoskeletal:  Positive for joint pain and neck pain.  Skin: Negative.   Neurological: Negative.   Endo/Heme/Allergies: Negative.   Psychiatric/Behavioral: Negative.       Physical Exam:  weight is 201 lb 4 oz (91.3 kg). Her oral temperature is 98.6 F (37 C). Her blood pressure is 158/87 (abnormal) and her pulse is 62. Her respiration is 18.   Wt Readings from Last 3 Encounters:  06/17/21 201 lb 4 oz (91.3 kg)  05/16/21 204 lb (92.5 kg)  04/18/21 209 lb 12.8 oz (95.2 kg)    Physical Exam Vitals reviewed.  HENT:     Head: Normocephalic and atraumatic.  Eyes:     Pupils:  Pupils are equal, round, and reactive to light.  Cardiovascular:     Rate and Rhythm: Normal rate and regular rhythm.     Heart sounds: Normal heart sounds.  Pulmonary:     Effort: Pulmonary effort is normal.     Breath sounds: Normal breath sounds.  Abdominal:     General: Bowel sounds are normal.     Palpations: Abdomen is soft.  Musculoskeletal:        General: No tenderness or deformity. Normal range of motion.     Cervical back: Normal range of motion.  Lymphadenopathy:     Cervical: No cervical adenopathy.  Skin:    General: Skin is warm and dry.     Findings: No erythema or rash.  Neurological:     Mental Status: She is alert and oriented to person, place, and time.  Psychiatric:        Behavior: Behavior normal.        Thought Content: Thought content normal.        Judgment: Judgment normal.     Lab Results  Component Value Date   WBC 2.7 (L) 06/17/2021   HGB 12.1 06/17/2021   HCT 35.0 (L) 06/17/2021   MCV 102.9 (H) 06/17/2021   PLT 243 06/17/2021   Lab Results  Component Value Date   FERRITIN 512 (H) 12/04/2020   IRON 96 12/04/2020   TIBC 244 12/04/2020   UIBC 147 12/04/2020   IRONPCTSAT 40 12/04/2020   Lab Results  Component Value Date   RBC 3.40 (L) 06/17/2021   Lab Results  Component Value Date   KAPLAMBRATIO 4.99 10/20/2020   Lab Results  Component Value Date   IGGSERUM 714 10/19/2020   IGA 153 10/19/2020   IGMSERUM 76 10/19/2020   Lab Results  Component Value Date   TOTALPROTELP 6.1 10/19/2020     Chemistry      Component Value Date/Time   NA 138 06/17/2021 1322   K 3.9 06/17/2021 1322   CL 101 06/17/2021 1322   CO2 29 06/17/2021 1322   BUN 11 06/17/2021 1322   CREATININE 0.71 06/17/2021 1322      Component Value Date/Time   CALCIUM 9.8 06/17/2021 1322   ALKPHOS 66 06/17/2021 1322   AST 17 06/17/2021 1322   ALT 9 06/17/2021 1322   BILITOT 0.5 06/17/2021 1322       Impression and Plan: Annette James is a very pleasant 60 yo  caucasian female with metastatic breast  cancer confined at this time to her bones.  She was diagnosed back in December 2021.  I still think that the PET scan is helpful.  I do think that it does show that she is responding.  We will see with the CA 27.29 is.  I think with local radiation therapy would not be a bad idea to the right hip area.  I think this will help and hopefully get the CA 27.29 down a little bit lower.  Again, her quality of life is doing great.  She is very functional.  She enjoys her grandson.  She really has a good life.  She is, such a long way since December.    Volanda Napoleon, MD 9/6/20222:29 PM

## 2021-06-18 LAB — CANCER ANTIGEN 27.29: CA 27.29: 25.9 U/mL (ref 0.0–38.6)

## 2021-06-19 ENCOUNTER — Encounter: Payer: Self-pay | Admitting: *Deleted

## 2021-06-19 NOTE — Progress Notes (Signed)
Patient has new patient appointment with RadOnc on 06/23/2021.  Oncology Nurse Navigator Documentation  Oncology Nurse Navigator Flowsheets 06/19/2021  Abnormal Finding Date -  Confirmed Diagnosis Date -  Diagnosis Status -  Planned Course of Treatment -  Phase of Treatment -  Chemotherapy Pending- Reason: -  Chemotherapy Actual Start Date: -  Navigator Follow Up Date: 06/23/2021  Navigator Follow Up Reason: Radiation  Navigator Location [facility]  Navigator Encounter Type Appt/Treatment Plan Review  Telephone -  Treatment Initiated Date -  Patient Visit Type MedOnc  Treatment Phase Active Tx  Barriers/Navigation Needs Coordination of Care;Education  Education -  Interventions None Required  Acuity Level 2-Minimal Needs (1-2 Barriers Identified)  Referrals -  Coordination of Care -  Education Method -  Support Groups/Services Friends and Family  Time Spent with Patient 15

## 2021-06-19 NOTE — Progress Notes (Signed)
Histology and Location of Primary Cancer: right Breast   Sites of Visceral and Bony Metastatic Disease: right hip, cervical spine   Location(s) of Symptomatic Metastases: right hip  Past/Anticipated chemotherapy by medical oncology, if any:  Dr. Marin Olp 06/17/2021 - She did have a recent PET scan done.  The PET scan showed that she had a nice response with her bony metastasis.  However, there was noted to be is some increase in foci in the right hip. -I think that radiation therapy would not be a bad idea for local control in the right hip area.  I would hate to make a change in therapy on her just for 1 area. She is in agreement with this.  PET 06/02/2021:  Diffuse widespread sclerotic and lytic lesion osseous metastatic disease. Several foci within the spine demonstrate and shoulder girdle demonstrate decreased hypermetabolic activity. For example, lesion at T7 demonstrates an SUV max of 4.4, previously 6.3, and lesion of the right scapula with SUV max of 2.7, previously 4.3. However, there are foci of uptake in the right pelvis which demonstrate increased activity. Lesion of the right pelvis with SUV max of 5.1, previously 3.3.  Biopsy :  10/25/2020     Pain on a scale of 0-10 is: None. She reports occasional pain in her right hip.  Does not bother her for the most part, did not even notice there was something there until her PET scan.   Numbness or weakness in extremities: none      Ambulatory status? Walker? Wheelchair?: Ambulatory, denies pain or gait instability.   SAFETY ISSUES: Prior radiation? 2/1-2/18/2022, Left Hip/Pelvis with Dr. Bobbe Medico Pacemaker/ICD? No Possible current pregnancy? No, postmenopausal Is the patient on methotrexate? no   Current Complaints / other details:

## 2021-06-21 ENCOUNTER — Other Ambulatory Visit: Payer: Self-pay | Admitting: Hematology & Oncology

## 2021-06-21 DIAGNOSIS — C7951 Secondary malignant neoplasm of bone: Secondary | ICD-10-CM

## 2021-06-21 DIAGNOSIS — C50919 Malignant neoplasm of unspecified site of unspecified female breast: Secondary | ICD-10-CM

## 2021-06-21 NOTE — Progress Notes (Signed)
Radiation Oncology         [phone] ________________________________  Name: Annette James MRN: [id number]  Date: 06/23/2021  DOB: May 22, 1961  Outpatient Re-Consultation   CC: [name], MD  Volanda Napoleon, MD    ICD-10-CM   1. Secondary malignant neoplasm of bone ([facility])  C79.51     2. 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 Since her last visit, the patient has maintained close follow up with Dr. Marin Olp; consistently reporting that she is feeling well and working without any problems. During her most recent follow-up visit with Dr. Marin Olp on 06/17/21, Dr. Marin Olp noted the patients last recorded CA 27.29 was up to 59 (noted to be troublesome). Based off of this finding and PET from 06/02/21 (detailed below), Dr. Marin Olp stated that radiation therapy will likely be beneficial for local control of foci with uptake seen to her right hip.   Pertinent imaging since the patient was last seen includes the following:  --PET-CT performed on 03/11/21 demonstrated a marked improvement in the previously seen diffuse widespread hypermetabolic skeletal metastasis. Metabolic activity was noted to have reduced to several foci within the spine and shoulder girdles. Imaging also revealed persistent, diffuse, sclerotic and lytic lesions on the CT portion of the exam. Otherwise, no evidence of soft tissue metastasis or adenopathy was seen in the abdomen or pelvis.   --PET-CT  performed on 06/02/21 demonstrated diffuse widespread hypermetabolic osseous metastatic disease. Additionally, several foci were seen within the spine and shoulder girdle demonstrating decreased hypermetabolic activity, however, foci of uptake were seen in the right pelvis which were indicative of increased activity.                      Allergies:  has No Known Allergies.  Meds: Current Outpatient Medications  Medication Sig Dispense Refill   ascorbic acid (VITAMIN C) 500 MG tablet Take 500 mg by mouth daily.     cyclobenzaprine (FLEXERIL) 5 MG tablet TAKE 1 TABLET BY MOUTH THREE TIMES A DAY AS NEEDED FOR MUSCLE SPASMS 30 tablet 2   IBRANCE 125 MG tablet TAKE 1 TABLET BY MOUTH ONCE DAILY WITH OR WITHOUT FOOD  FOR 21 DAYS, FOLLOWED BY 7  DAYS OFF, REPEATED EVERY 28 DAYS 21 tablet 4   Multiple Vitamins-Minerals (ONE-A-DAY WOMENS 50 PLUS) TABS Take 1 tablet by mouth daily.     ondansetron (ZOFRAN) 8 MG tablet TAKE 1 TABLET (8 MG TOTAL) BY MOUTH EVERY 8 (EIGHT) HOURS AS NEEDED FOR NAUSEA OR VOMITING. 30 tablet 3   oxyCODONE (OXY IR/ROXICODONE) 5 MG immediate release tablet TAKE 1-2 TABLETS BY MOUTH EVERY 4 HOURS AS NEEDED 60 tablet 0   fentaNYL (DURAGESIC) 50 MCG/HR Place 1 patch onto the skin every other day. 15 patch 0   No current facility-administered medications for this encounter.    Physical Findings: The patient is in no acute distress. Patient is alert and oriented.  height is  _0  (1.727 m) and weight is 205 lb 12.8 oz (93.4 kg). Her temperature is 97.9 F (36.6 C). Her blood pressure is 143/82 (abnormal) and her pulse is 66. Her respiration is 18 and oxygen saturation is 100%. .  No significant changes. Lungs are clear to auscultation bilaterally. Heart has regular rate and rhythm. No palpable cervical, supraclavicular, or axillary adenopathy. Abdomen soft, non-tender, normal bowel sounds.  Lower motor strength is 5 out of 5 in the proximal and distal muscle groups.  Palpation along the  pelvis area reveals no areas of point tenderness.   Lab Findings: Lab Results  Component Value Date   WBC 2.7 (L) 06/17/2021   HGB 12.1 06/17/2021   HCT 35.0 (L) 06/17/2021   MCV 102.9 (H) 06/17/2021   PLT 243 06/17/2021    Radiographic Findings: NM PET Image Restag (PS) Skull Base To Thigh  Result Date: 06/02/2021 CLINICAL DATA:  Follow-up treatment strategy for breast cancer. EXAM: NUCLEAR MEDICINE PET SKULL BASE TO THIGH TECHNIQUE: 10.1 mCi F-18 FDG was injected intravenously. Full-ring PET imaging was performed from the skull base to thigh after the radiotracer. CT data was obtained and used for attenuation correction and anatomic localization. Fasting blood glucose: 81 mg/dl COMPARISON:  PET-CT dated Mar 11, 2021 FINDINGS: Mediastinal blood pool activity: SUV max 2.3 Liver activity: SUV max 4.8 NECK: No hypermetabolic lymph nodes in the neck. Incidental CT findings: none CHEST: Soft tissue of the medial right breast with slightly increased FDG activity, similar in size and FDG activity when compared with prior exam, SUV max of 2.6, previously 2.4. No suspicious pulmonary nodules. Incidental CT findings: none ABDOMEN/PELVIS: No abnormal hypermetabolic activity within the liver, pancreas, adrenal glands, or spleen. No hypermetabolic lymph nodes in the abdomen or pelvis. Incidental CT findings: Post appendectomy and cholecystectomy. Unchanged cystic lesion of the left adnexa which measures up to 4.5 cm. SKELETON: Diffuse widespread sclerotic and lytic lesion osseous metastatic disease. Several foci within the spine demonstrate and shoulder girdle demonstrate decreased hypermetabolic activity. For example, lesion at T7 demonstrates an SUV max of 4.4, previously 6.3, and lesion of the right scapula with SUV max of 2.7, previously 4.3. However, there are foci of uptake in the right pelvis which demonstrate increased activity. Lesion of the right pelvis with SUV max of 5.1, previously 3.3. Incidental  CT findings: none IMPRESSION: Diffuse widespread hypermetabolic osseous metastatic disease. Several foci within the spine and shoulder girdle demonstrate decreased hypermetabolic activity, however there are foci of uptake in the right pelvis which demonstrate increased activity. Findings appear unchanged on CT portion of the exam. No evidence of soft tissue metastatic disease. Electronically Signed   By: Yetta Glassman M.D.   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  This is showed increased activity.  Patient does not appear to be symptomatic from this progressive area but since this is her only site of progression would agree with Dr. Antonieta Pert recommendations for radiation therapy to the right pelvis region.  Plan: Patient will return in the near future for CT simulation anticipate 14 treatments directed to the right pelvis region.   35 minutes of total time was spent for this patient encounter, including preparation, face-to-face counseling with the patient and coordination of care, physical  exam, and documentation of the encounter. ____________________________________  Blair Promise, PhD, MD   This document serves as a record of services personally performed by Gery Pray, MD. It was created on his behalf by Roney Mans, a trained medical scribe. The creation of this record is based on the scribe's personal observations and the provider's statements to them. This document has been checked and approved by the attending provider.

## 2021-06-22 ENCOUNTER — Other Ambulatory Visit: Payer: Self-pay | Admitting: Hematology & Oncology

## 2021-06-22 DIAGNOSIS — C50919 Malignant neoplasm of unspecified site of unspecified female breast: Secondary | ICD-10-CM

## 2021-06-23 ENCOUNTER — Ambulatory Visit
Admission: RE | Admit: 2021-06-23 | Discharge: 2021-06-23 | Disposition: A | Discharge status: Home / Self Care | Payer: BC Managed Care – PPO | Source: Ambulatory Visit | Attending: Radiation Oncology | Admitting: Radiation Oncology

## 2021-06-23 ENCOUNTER — Other Ambulatory Visit: Payer: Self-pay

## 2021-06-23 ENCOUNTER — Encounter: Payer: Self-pay | Admitting: Radiation Oncology

## 2021-06-23 ENCOUNTER — Encounter: Payer: Self-pay | Admitting: Hematology & Oncology

## 2021-06-23 VITALS — BP 143/82 | HR 66 | Temp 97.9°F | Resp 18 | Ht 68.0 in | Wt 205.8 lb

## 2021-06-23 DIAGNOSIS — C7951 Secondary malignant neoplasm of bone: Secondary | ICD-10-CM

## 2021-06-23 DIAGNOSIS — C50411 Malignant neoplasm of upper-outer quadrant of right female breast: Secondary | ICD-10-CM | POA: Insufficient documentation

## 2021-06-23 DIAGNOSIS — Z170 Estrogen receptor positive status [ER+]: Secondary | ICD-10-CM | POA: Diagnosis not present

## 2021-06-23 DIAGNOSIS — Z923 Personal history of irradiation: Secondary | ICD-10-CM | POA: Diagnosis not present

## 2021-06-23 DIAGNOSIS — R978 Other abnormal tumor markers: Secondary | ICD-10-CM | POA: Diagnosis not present

## 2021-06-23 DIAGNOSIS — C50919 Malignant neoplasm of unspecified site of unspecified female breast: Secondary | ICD-10-CM

## 2021-06-23 MED ORDER — FENTANYL 50 MCG/HR TD PT72: 1.0000 | MEDICATED_PATCH | TRANSDERMAL | 0 refills | Status: DC

## 2021-06-24 ENCOUNTER — Encounter: Payer: Self-pay | Admitting: *Deleted

## 2021-06-24 NOTE — Progress Notes (Signed)
Patient seen by radiation oncology. Scheduled for Sim on 06/30/21.  Oncology Nurse Navigator Documentation  Oncology Nurse Navigator Flowsheets 06/24/2021  Abnormal Finding Date -  Confirmed Diagnosis Date -  Diagnosis Status -  Planned Course of Treatment -  Phase of Treatment -  Chemotherapy Pending- Reason: -  Chemotherapy Actual Start Date: -  Navigator Follow Up Date: 06/30/2021  Navigator Follow Up Reason: Radiation  Navigator Location [facility]  Navigator Encounter Type Appt/Treatment Plan Review  Telephone -  Treatment Initiated Date -  Patient Visit Type MedOnc  Treatment Phase Active Tx  Barriers/Navigation Needs Coordination of Care;Education  Education -  Interventions None Required  Acuity Level 2-Minimal Needs (1-2 Barriers Identified)  Referrals -  Coordination of Care -  Education Method -  Support Groups/Services Friends and Family  Time Spent with Patient 15

## 2021-06-27 ENCOUNTER — Other Ambulatory Visit (HOSPITAL_COMMUNITY): Payer: Self-pay

## 2021-06-30 ENCOUNTER — Ambulatory Visit
Admission: RE | Admit: 2021-06-30 | Discharge: 2021-06-30 | Disposition: A | Discharge status: Home / Self Care | Payer: BC Managed Care – PPO | Source: Ambulatory Visit | Attending: Radiation Oncology | Admitting: Radiation Oncology

## 2021-06-30 ENCOUNTER — Other Ambulatory Visit: Payer: Self-pay

## 2021-06-30 DIAGNOSIS — Z510 Encounter for antineoplastic radiation therapy: Secondary | ICD-10-CM | POA: Diagnosis not present

## 2021-06-30 DIAGNOSIS — C50411 Malignant neoplasm of upper-outer quadrant of right female breast: Secondary | ICD-10-CM | POA: Diagnosis not present

## 2021-06-30 DIAGNOSIS — C7951 Secondary malignant neoplasm of bone: Secondary | ICD-10-CM | POA: Diagnosis not present

## 2021-06-30 DIAGNOSIS — Z170 Estrogen receptor positive status [ER+]: Secondary | ICD-10-CM | POA: Diagnosis not present

## 2021-07-01 ENCOUNTER — Encounter: Payer: Self-pay | Admitting: *Deleted

## 2021-07-01 NOTE — Progress Notes (Signed)
Oncology Nurse Navigator Documentation  Oncology Nurse Navigator Flowsheets 07/01/2021  Abnormal Finding Date -  Confirmed Diagnosis Date -  Diagnosis Status -  Planned Course of Treatment -  Phase of Treatment Radiation  Chemotherapy Pending- Reason: -  Chemotherapy Actual Start Date: -  Radiation Actual Start Date: 07/07/2021  Radiation Expected End Date: 07/24/2021  Navigator Follow Up Date: 07/16/2021  Navigator Follow Up Reason: Follow-up Appointment  Navigator Location [facility]  Navigator Encounter Type Appt/Treatment Plan Review  Telephone -  Treatment Initiated Date -  Patient Visit Type MedOnc  Treatment Phase Active Tx  Barriers/Navigation Needs Coordination of Care;Education  Education -  Interventions None Required  Acuity Level 2-Minimal Needs (1-2 Barriers Identified)  Referrals -  Coordination of Care -  Education Method -  Support Groups/Services Friends and Family  Time Spent with Patient 15

## 2021-07-03 ENCOUNTER — Encounter: Payer: Self-pay | Admitting: *Deleted

## 2021-07-04 DIAGNOSIS — Z510 Encounter for antineoplastic radiation therapy: Secondary | ICD-10-CM | POA: Diagnosis not present

## 2021-07-04 DIAGNOSIS — C50411 Malignant neoplasm of upper-outer quadrant of right female breast: Secondary | ICD-10-CM | POA: Diagnosis not present

## 2021-07-04 DIAGNOSIS — Z170 Estrogen receptor positive status [ER+]: Secondary | ICD-10-CM | POA: Diagnosis not present

## 2021-07-04 DIAGNOSIS — C7951 Secondary malignant neoplasm of bone: Secondary | ICD-10-CM | POA: Diagnosis not present

## 2021-07-07 ENCOUNTER — Ambulatory Visit
Admission: RE | Admit: 2021-07-07 | Discharge: 2021-07-07 | Disposition: A | Discharge status: Home / Self Care | Payer: BC Managed Care – PPO | Source: Ambulatory Visit | Attending: Radiation Oncology | Admitting: Radiation Oncology

## 2021-07-07 DIAGNOSIS — Z510 Encounter for antineoplastic radiation therapy: Secondary | ICD-10-CM | POA: Diagnosis not present

## 2021-07-07 DIAGNOSIS — C7951 Secondary malignant neoplasm of bone: Secondary | ICD-10-CM

## 2021-07-07 DIAGNOSIS — Z170 Estrogen receptor positive status [ER+]: Secondary | ICD-10-CM | POA: Diagnosis not present

## 2021-07-07 DIAGNOSIS — C50919 Malignant neoplasm of unspecified site of unspecified female breast: Secondary | ICD-10-CM

## 2021-07-07 DIAGNOSIS — C50411 Malignant neoplasm of upper-outer quadrant of right female breast: Secondary | ICD-10-CM | POA: Diagnosis not present

## 2021-07-08 ENCOUNTER — Ambulatory Visit
Admission: RE | Admit: 2021-07-08 | Discharge: 2021-07-08 | Disposition: A | Discharge status: Home / Self Care | Payer: BC Managed Care – PPO | Source: Ambulatory Visit | Attending: Radiation Oncology | Admitting: Radiation Oncology

## 2021-07-08 ENCOUNTER — Other Ambulatory Visit: Payer: Self-pay

## 2021-07-08 DIAGNOSIS — Z510 Encounter for antineoplastic radiation therapy: Secondary | ICD-10-CM | POA: Diagnosis not present

## 2021-07-08 DIAGNOSIS — C7951 Secondary malignant neoplasm of bone: Secondary | ICD-10-CM | POA: Diagnosis not present

## 2021-07-08 DIAGNOSIS — C50411 Malignant neoplasm of upper-outer quadrant of right female breast: Secondary | ICD-10-CM | POA: Diagnosis not present

## 2021-07-08 DIAGNOSIS — Z170 Estrogen receptor positive status [ER+]: Secondary | ICD-10-CM | POA: Diagnosis not present

## 2021-07-09 ENCOUNTER — Ambulatory Visit
Admission: RE | Admit: 2021-07-09 | Discharge: 2021-07-09 | Disposition: A | Discharge status: Home / Self Care | Payer: BC Managed Care – PPO | Source: Ambulatory Visit | Attending: Radiation Oncology | Admitting: Radiation Oncology

## 2021-07-09 DIAGNOSIS — Z510 Encounter for antineoplastic radiation therapy: Secondary | ICD-10-CM | POA: Diagnosis not present

## 2021-07-09 DIAGNOSIS — C50411 Malignant neoplasm of upper-outer quadrant of right female breast: Secondary | ICD-10-CM | POA: Diagnosis not present

## 2021-07-09 DIAGNOSIS — Z170 Estrogen receptor positive status [ER+]: Secondary | ICD-10-CM | POA: Diagnosis not present

## 2021-07-09 DIAGNOSIS — C7951 Secondary malignant neoplasm of bone: Secondary | ICD-10-CM | POA: Diagnosis not present

## 2021-07-10 ENCOUNTER — Other Ambulatory Visit: Payer: Self-pay

## 2021-07-10 ENCOUNTER — Ambulatory Visit
Admission: RE | Admit: 2021-07-10 | Discharge: 2021-07-10 | Disposition: A | Discharge status: Home / Self Care | Payer: BC Managed Care – PPO | Source: Ambulatory Visit | Attending: Radiation Oncology | Admitting: Radiation Oncology

## 2021-07-10 ENCOUNTER — Other Ambulatory Visit: Payer: Self-pay | Admitting: Hematology & Oncology

## 2021-07-10 DIAGNOSIS — Z510 Encounter for antineoplastic radiation therapy: Secondary | ICD-10-CM | POA: Diagnosis not present

## 2021-07-10 DIAGNOSIS — C7951 Secondary malignant neoplasm of bone: Secondary | ICD-10-CM | POA: Diagnosis not present

## 2021-07-10 DIAGNOSIS — C50411 Malignant neoplasm of upper-outer quadrant of right female breast: Secondary | ICD-10-CM | POA: Diagnosis not present

## 2021-07-10 DIAGNOSIS — Z170 Estrogen receptor positive status [ER+]: Secondary | ICD-10-CM | POA: Diagnosis not present

## 2021-07-10 DIAGNOSIS — C50919 Malignant neoplasm of unspecified site of unspecified female breast: Secondary | ICD-10-CM

## 2021-07-10 MED ORDER — OXYCODONE HCL 5 MG PO TABS: ORAL_TABLET | ORAL | 0 refills | Status: DC | PRN

## 2021-07-10 MED ORDER — FENTANYL 50 MCG/HR TD PT72: 1.0000 | MEDICATED_PATCH | TRANSDERMAL | 0 refills | Status: DC

## 2021-07-11 ENCOUNTER — Ambulatory Visit
Admission: RE | Admit: 2021-07-11 | Discharge: 2021-07-11 | Disposition: A | Discharge status: Home / Self Care | Payer: BC Managed Care – PPO | Source: Ambulatory Visit | Attending: Radiation Oncology | Admitting: Radiation Oncology

## 2021-07-11 DIAGNOSIS — Z170 Estrogen receptor positive status [ER+]: Secondary | ICD-10-CM | POA: Diagnosis not present

## 2021-07-11 DIAGNOSIS — C7951 Secondary malignant neoplasm of bone: Secondary | ICD-10-CM | POA: Diagnosis not present

## 2021-07-11 DIAGNOSIS — C50411 Malignant neoplasm of upper-outer quadrant of right female breast: Secondary | ICD-10-CM | POA: Diagnosis not present

## 2021-07-11 DIAGNOSIS — Z510 Encounter for antineoplastic radiation therapy: Secondary | ICD-10-CM | POA: Diagnosis not present

## 2021-07-14 ENCOUNTER — Ambulatory Visit
Admission: RE | Admit: 2021-07-14 | Discharge: 2021-07-14 | Disposition: A | Discharge status: Home / Self Care | Payer: BC Managed Care – PPO | Source: Ambulatory Visit | Attending: Radiation Oncology | Admitting: Radiation Oncology

## 2021-07-14 ENCOUNTER — Other Ambulatory Visit: Payer: Self-pay

## 2021-07-14 DIAGNOSIS — C7951 Secondary malignant neoplasm of bone: Secondary | ICD-10-CM | POA: Insufficient documentation

## 2021-07-14 DIAGNOSIS — C50919 Malignant neoplasm of unspecified site of unspecified female breast: Secondary | ICD-10-CM | POA: Diagnosis not present

## 2021-07-14 DIAGNOSIS — Z170 Estrogen receptor positive status [ER+]: Secondary | ICD-10-CM | POA: Insufficient documentation

## 2021-07-14 DIAGNOSIS — Z510 Encounter for antineoplastic radiation therapy: Secondary | ICD-10-CM | POA: Insufficient documentation

## 2021-07-14 DIAGNOSIS — C50411 Malignant neoplasm of upper-outer quadrant of right female breast: Secondary | ICD-10-CM | POA: Insufficient documentation

## 2021-07-14 DIAGNOSIS — Z5111 Encounter for antineoplastic chemotherapy: Secondary | ICD-10-CM | POA: Diagnosis not present

## 2021-07-15 ENCOUNTER — Ambulatory Visit
Admission: RE | Admit: 2021-07-15 | Discharge: 2021-07-15 | Disposition: A | Discharge status: Home / Self Care | Payer: BC Managed Care – PPO | Source: Ambulatory Visit | Attending: Radiation Oncology | Admitting: Radiation Oncology

## 2021-07-15 ENCOUNTER — Other Ambulatory Visit (HOSPITAL_BASED_OUTPATIENT_CLINIC_OR_DEPARTMENT_OTHER): Payer: Self-pay

## 2021-07-15 DIAGNOSIS — C50411 Malignant neoplasm of upper-outer quadrant of right female breast: Secondary | ICD-10-CM | POA: Diagnosis not present

## 2021-07-15 DIAGNOSIS — Z5111 Encounter for antineoplastic chemotherapy: Secondary | ICD-10-CM | POA: Diagnosis not present

## 2021-07-15 DIAGNOSIS — Z170 Estrogen receptor positive status [ER+]: Secondary | ICD-10-CM | POA: Diagnosis not present

## 2021-07-15 DIAGNOSIS — Z510 Encounter for antineoplastic radiation therapy: Secondary | ICD-10-CM | POA: Diagnosis not present

## 2021-07-15 DIAGNOSIS — C7951 Secondary malignant neoplasm of bone: Secondary | ICD-10-CM | POA: Diagnosis not present

## 2021-07-15 DIAGNOSIS — C50919 Malignant neoplasm of unspecified site of unspecified female breast: Secondary | ICD-10-CM | POA: Diagnosis not present

## 2021-07-15 MED FILL — Ondansetron HCl Tab 8 MG: ORAL | 24 days supply | Qty: 9 | Fill #6 | Status: AC

## 2021-07-16 ENCOUNTER — Inpatient Hospital Stay (HOSPITAL_BASED_OUTPATIENT_CLINIC_OR_DEPARTMENT_OTHER): Payer: BC Managed Care – PPO | Admitting: Hematology & Oncology

## 2021-07-16 ENCOUNTER — Encounter: Payer: Self-pay | Admitting: *Deleted

## 2021-07-16 ENCOUNTER — Inpatient Hospital Stay: Payer: BC Managed Care – PPO | Attending: Hematology & Oncology

## 2021-07-16 ENCOUNTER — Other Ambulatory Visit: Payer: Self-pay

## 2021-07-16 ENCOUNTER — Ambulatory Visit
Admission: RE | Admit: 2021-07-16 | Discharge: 2021-07-16 | Disposition: A | Discharge status: Home / Self Care | Payer: BC Managed Care – PPO | Source: Ambulatory Visit | Attending: Radiation Oncology | Admitting: Radiation Oncology

## 2021-07-16 ENCOUNTER — Inpatient Hospital Stay: Payer: BC Managed Care – PPO

## 2021-07-16 ENCOUNTER — Encounter: Payer: Self-pay | Admitting: Hematology & Oncology

## 2021-07-16 VITALS — BP 150/75 | HR 64 | Temp 98.5°F | Resp 18 | Wt 203.0 lb

## 2021-07-16 DIAGNOSIS — C50919 Malignant neoplasm of unspecified site of unspecified female breast: Secondary | ICD-10-CM

## 2021-07-16 DIAGNOSIS — Z510 Encounter for antineoplastic radiation therapy: Secondary | ICD-10-CM | POA: Diagnosis not present

## 2021-07-16 DIAGNOSIS — Z5111 Encounter for antineoplastic chemotherapy: Secondary | ICD-10-CM | POA: Diagnosis not present

## 2021-07-16 DIAGNOSIS — Z170 Estrogen receptor positive status [ER+]: Secondary | ICD-10-CM | POA: Insufficient documentation

## 2021-07-16 DIAGNOSIS — C7951 Secondary malignant neoplasm of bone: Secondary | ICD-10-CM | POA: Diagnosis not present

## 2021-07-16 DIAGNOSIS — C50411 Malignant neoplasm of upper-outer quadrant of right female breast: Secondary | ICD-10-CM | POA: Diagnosis not present

## 2021-07-16 LAB — CBC WITH DIFFERENTIAL (CANCER CENTER ONLY)
Abs Immature Granulocytes: 0.02 10*3/uL (ref 0.00–0.07)
Basophils Absolute: 0 10*3/uL (ref 0.0–0.1)
Basophils Relative: 1 %
Eosinophils Absolute: 0.1 10*3/uL (ref 0.0–0.5)
Eosinophils Relative: 4 %
HCT: 36 % (ref 36.0–46.0)
Hemoglobin: 12.6 g/dL (ref 12.0–15.0)
Immature Granulocytes: 1 %
Lymphocytes Relative: 20 %
Lymphs Abs: 0.4 10*3/uL — ABNORMAL LOW (ref 0.7–4.0)
MCH: 35.4 pg — ABNORMAL HIGH (ref 26.0–34.0)
MCHC: 35 g/dL (ref 30.0–36.0)
MCV: 101.1 fL — ABNORMAL HIGH (ref 80.0–100.0)
Monocytes Absolute: 0.1 10*3/uL (ref 0.1–1.0)
Monocytes Relative: 6 %
Neutro Abs: 1.4 10*3/uL — ABNORMAL LOW (ref 1.7–7.7)
Neutrophils Relative %: 68 %
Platelet Count: 194 10*3/uL (ref 150–400)
RBC: 3.56 MIL/uL — ABNORMAL LOW (ref 3.87–5.11)
RDW: 13.1 % (ref 11.5–15.5)
WBC Count: 2.1 10*3/uL — ABNORMAL LOW (ref 4.0–10.5)
nRBC: 0 % (ref 0.0–0.2)

## 2021-07-16 LAB — CMP (CANCER CENTER ONLY)
ALT: 13 U/L (ref 0–44)
AST: 18 U/L (ref 15–41)
Albumin: 4.4 g/dL (ref 3.5–5.0)
Alkaline Phosphatase: 73 U/L (ref 38–126)
Anion gap: 7 (ref 5–15)
BUN: 14 mg/dL (ref 6–20)
CO2: 31 mmol/L (ref 22–32)
Calcium: 9.9 mg/dL (ref 8.9–10.3)
Chloride: 104 mmol/L (ref 98–111)
Creatinine: 0.7 mg/dL (ref 0.44–1.00)
GFR, Estimated: >60 mL/min (ref >60)
Glucose, Bld: 100 mg/dL — ABNORMAL HIGH (ref 70–99)
Potassium: 3.7 mmol/L (ref 3.5–5.1)
Sodium: 142 mmol/L (ref 135–145)
Total Bilirubin: 0.5 mg/dL (ref 0.3–1.2)
Total Protein: 6.8 g/dL (ref 6.5–8.1)

## 2021-07-16 MED ORDER — FULVESTRANT 250 MG/5ML IM SOLN
500.0000 mg | Freq: Once | INTRAMUSCULAR | Status: AC
  Administered 2021-07-16: 500 mg via INTRAMUSCULAR

## 2021-07-16 NOTE — Progress Notes (Signed)
Oncology Nurse Navigator Documentation  Oncology Nurse Navigator Flowsheets 07/16/2021  Abnormal Finding Date -  Confirmed Diagnosis Date -  Diagnosis Status -  Planned Course of Treatment -  Phase of Treatment -  Chemotherapy Pending- Reason: -  Chemotherapy Actual Start Date: -  Radiation Actual Start Date: -  Radiation Expected End Date: -  Navigator Follow Up Date: 08/18/2021  Navigator Follow Up Reason: Follow-up Appointment  Navigator Location [facility]  Navigator Encounter Type Appt/Treatment Plan Review  Telephone -  Treatment Initiated Date -  Patient Visit Type MedOnc  Treatment Phase Active Tx  Barriers/Navigation Needs No Barriers At This Time  Education -  Interventions Psycho-Social Support  Acuity Level 1-No Barriers  Referrals -  Coordination of Care -  Education Method -  Support Groups/Services Friends and Family  Time Spent with Patient 15

## 2021-07-16 NOTE — Progress Notes (Signed)
Hematology and Oncology Follow Up Visit  Annette James [id number] [birth year] 60 y.o. 07/16/2021   Principle Diagnosis:  Metastatic breast cancer-bone only metastasis-ER positive/PR positive/HER2 LOW ///  PIK3CA (+)   Current Therapy:        Faslodex 500 mg IM monthly-start on 11/06/2020 Ribociclib 600 mg p.o. daily (21 days on/7 days off)-start on 11/06/2020 XRT to the RIGHT hip Zometa 4 mg IV q. 3 months -- next dose on 08/2021   Interim History:  Annette James is here today for follow-up.  Annette James birthday is tomorrow  So happy for Annette James.  I know she will have a wonderful birthday.  Annette James family will take Annette James out.  She is doing radiation treatments to the right hip.  She is about halfway through.  She is quite tired.  She had a little bit of nausea.  Otherwise, she is doing okay.  She is doing well on the ribociclib.  She has had no change in bowel or bladder habits.  She has had no problems with cough or shortness of breath.  Annette James last CA 27.29 was 26.  She has had no rashes.  There is been no leg swelling.  She has had no bleeding.  She is still working.  She is quite busy at work.  Overall, I would say Annette James performance status is ECOG 1.   Medications:  Allergies as of 07/16/2021   No Known Allergies      Medication List        Accurate as of July 16, 2021  8:09 AM. If you have any questions, ask your nurse or doctor.          ascorbic acid 500 MG tablet Commonly known as: VITAMIN C Take 500 mg by mouth daily.   cyclobenzaprine 5 MG tablet Commonly known as: FLEXERIL TAKE 1 TABLET BY MOUTH THREE TIMES A DAY AS NEEDED FOR MUSCLE SPASMS   fentaNYL 50 MCG/HR Commonly known as: [place] 1 patch onto the skin every other day.   Ibrance 125 MG tablet Generic drug: palbociclib TAKE 1 TABLET BY MOUTH ONCE DAILY WITH OR WITHOUT FOOD  FOR 21 DAYS, FOLLOWED BY 7  DAYS OFF, REPEATED EVERY 28 DAYS   ondansetron 8 MG tablet Commonly known as: ZOFRAN TAKE  1 TABLET (8 MG TOTAL) BY MOUTH EVERY 8 (EIGHT) HOURS AS NEEDED FOR NAUSEA OR VOMITING.   One-A-Day Womens 50 Plus Tabs Take 1 tablet by mouth daily.   oxyCODONE 5 MG immediate release tablet Commonly known as: Oxy IR/ROXICODONE TAKE 1-2 TABLETS BY MOUTH EVERY 4 HOURS AS NEEDED        Allergies: No Known Allergies  Past Medical History, Surgical history, Social history, and Family History were reviewed and updated.  Review of Systems: Review of Systems  Constitutional: Negative.   HENT: Negative.    Eyes: Negative.   Respiratory: Negative.    Cardiovascular: Negative.   Gastrointestinal: Negative.   Genitourinary: Negative.   Musculoskeletal:  Positive for joint pain and neck pain.  Skin: Negative.   Neurological: Negative.   Endo/Heme/Allergies: Negative.   Psychiatric/Behavioral: Negative.       Physical Exam:  weight is 203 lb (92.1 kg). Annette James oral temperature is 98.5 F (36.9 C). Annette James blood pressure is 150/75 (abnormal) and Annette James pulse is 64. Annette James respiration is 18 and oxygen saturation is 99%.   Wt Readings from Last 3 Encounters:  07/16/21 203 lb (92.1 kg)  06/23/21 205 lb 12.8 oz (93.4 kg)  06/17/21 201  lb 4 oz (91.3 kg)    Physical Exam Vitals reviewed.  HENT:     Head: Normocephalic and atraumatic.  Eyes:     Pupils: Pupils are equal, round, and reactive to light.  Cardiovascular:     Rate and Rhythm: Normal rate and regular rhythm.     Heart sounds: Normal heart sounds.  Pulmonary:     Effort: Pulmonary effort is normal.     Breath sounds: Normal breath sounds.  Abdominal:     General: Bowel sounds are normal.     Palpations: Abdomen is soft.  Musculoskeletal:        General: No tenderness or deformity. Normal range of motion.     Cervical back: Normal range of motion.  Lymphadenopathy:     Cervical: No cervical adenopathy.  Skin:    General: Skin is warm and dry.     Findings: No erythema or rash.  Neurological:     Mental Status: She is alert  and oriented to person, place, and time.  Psychiatric:        Behavior: Behavior normal.        Thought Content: Thought content normal.        Judgment: Judgment normal.     Lab Results  Component Value Date   WBC 2.1 (L) 07/16/2021   HGB 12.6 07/16/2021   HCT 36.0 07/16/2021   MCV 101.1 (H) 07/16/2021   PLT 194 07/16/2021   Lab Results  Component Value Date   FERRITIN 512 (H) 12/04/2020   IRON 96 12/04/2020   TIBC 244 12/04/2020   UIBC 147 12/04/2020   IRONPCTSAT 40 12/04/2020   Lab Results  Component Value Date   RBC 3.56 (L) 07/16/2021   Lab Results  Component Value Date   KAPLAMBRATIO 4.99 10/20/2020   Lab Results  Component Value Date   IGGSERUM 714 10/19/2020   IGA 153 10/19/2020   IGMSERUM 76 10/19/2020   Lab Results  Component Value Date   TOTALPROTELP 6.1 10/19/2020     Chemistry      Component Value Date/Time   NA 138 06/17/2021 1322   K 3.9 06/17/2021 1322   CL 101 06/17/2021 1322   CO2 29 06/17/2021 1322   BUN 11 06/17/2021 1322   CREATININE 0.71 06/17/2021 1322      Component Value Date/Time   CALCIUM 9.8 06/17/2021 1322   ALKPHOS 66 06/17/2021 1322   AST 17 06/17/2021 1322   ALT 9 06/17/2021 1322   BILITOT 0.5 06/17/2021 1322       Impression and Plan: Ms. Annette James is a very pleasant 60 yo caucasian female with metastatic breast cancer confined at this time to Annette James bones.  She was diagnosed back in December 2021.  She is getting radiation therapy to the right hip.  This was secondary to the fact that Annette James CA 27.29 was going up.  However, in the last level was pretty much normal.  I am glad that we are doing the radiation.  I would like to be proactive.  She will get Annette James Faslodex today.  I will have Annette James plan to come back in another month or so.  When she comes back, she will get Annette James Zometa in addition to the Faslodex.  Annette Napoleon, MD 10/5/20228:09 AM

## 2021-07-16 NOTE — Patient Instructions (Signed)
Fulvestrant injection What is this medication? FULVESTRANT (ful VES trant) blocks the effects of estrogen. It is used to treat breast cancer. This medicine may be used for other purposes; ask your health care provider or pharmacist if you have questions. COMMON BRAND NAME(S): FASLODEX What should I tell my care team before I take this medication? They need to know if you have any of these conditions: bleeding disorders liver disease low blood counts, like low white cell, platelet, or red cell counts an unusual or allergic reaction to fulvestrant, other medicines, foods, dyes, or preservatives pregnant or trying to get pregnant breast-feeding How should I use this medication? This medicine is for injection into a muscle. It is usually given by a health care professional in a hospital or clinic setting. Talk to your pediatrician regarding the use of this medicine in children. Special care may be needed. Overdosage: If you think you have taken too much of this medicine contact a poison control center or emergency room at once. NOTE: This medicine is only for you. Do not share this medicine with others. What if I miss a dose? It is important not to miss your dose. Call your doctor or health care professional if you are unable to keep an appointment. What may interact with this medication? medicines that treat or prevent blood clots like warfarin, enoxaparin, dalteparin, apixaban, dabigatran, and rivaroxaban This list may not describe all possible interactions. Give your health care provider a list of all the medicines, herbs, non-prescription drugs, or dietary supplements you use. Also tell them if you smoke, drink alcohol, or use illegal drugs. Some items may interact with your medicine. What should I watch for while using this medication? Your condition will be monitored carefully while you are receiving this medicine. You will need important blood work done while you are taking this  medicine. Do not become pregnant while taking this medicine or for at least 1 year after stopping it. Women of child-bearing potential will need to have a negative pregnancy test before starting this medicine. Women should inform their doctor if they wish to become pregnant or think they might be pregnant. There is a potential for serious side effects to an unborn child. Men should inform their doctors if they wish to father a child. This medicine may lower sperm counts. Talk to your health care professional or pharmacist for more information. Do not breast-feed an infant while taking this medicine or for 1 year after the last dose. What side effects may I notice from receiving this medication? Side effects that you should report to your doctor or health care professional as soon as possible: allergic reactions like skin rash, itching or hives, swelling of the face, lips, or tongue feeling faint or lightheaded, falls pain, tingling, numbness, or weakness in the legs signs and symptoms of infection like fever or chills; cough; flu-like symptoms; sore throat vaginal bleeding Side effects that usually do not require medical attention (report to your doctor or health care professional if they continue or are bothersome): aches, pains constipation diarrhea headache hot flashes nausea, vomiting pain at site where injected stomach pain This list may not describe all possible side effects. Call your doctor for medical advice about side effects. You may report side effects to FDA at 1-800-FDA-1088. Where should I keep my medication? This drug is given in a hospital or clinic and will not be stored at home. NOTE: This sheet is a summary. It may not cover all possible information. If you have   questions about this medicine, talk to your doctor, pharmacist, or health care provider.  2022 Elsevier/Gold Standard (2018-01-06 11:34:41)  

## 2021-07-17 ENCOUNTER — Ambulatory Visit
Admission: RE | Admit: 2021-07-17 | Discharge: 2021-07-17 | Disposition: A | Discharge status: Home / Self Care | Payer: BC Managed Care – PPO | Source: Ambulatory Visit | Attending: Radiation Oncology | Admitting: Radiation Oncology

## 2021-07-17 DIAGNOSIS — C50411 Malignant neoplasm of upper-outer quadrant of right female breast: Secondary | ICD-10-CM | POA: Diagnosis not present

## 2021-07-17 DIAGNOSIS — C7951 Secondary malignant neoplasm of bone: Secondary | ICD-10-CM | POA: Diagnosis not present

## 2021-07-17 DIAGNOSIS — C50919 Malignant neoplasm of unspecified site of unspecified female breast: Secondary | ICD-10-CM | POA: Diagnosis not present

## 2021-07-17 DIAGNOSIS — Z170 Estrogen receptor positive status [ER+]: Secondary | ICD-10-CM | POA: Diagnosis not present

## 2021-07-17 DIAGNOSIS — Z5111 Encounter for antineoplastic chemotherapy: Secondary | ICD-10-CM | POA: Diagnosis not present

## 2021-07-17 DIAGNOSIS — Z510 Encounter for antineoplastic radiation therapy: Secondary | ICD-10-CM | POA: Diagnosis not present

## 2021-07-17 LAB — CANCER ANTIGEN 27.29: CA 27.29: 27 U/mL (ref 0.0–38.6)

## 2021-07-18 ENCOUNTER — Ambulatory Visit
Admission: RE | Admit: 2021-07-18 | Discharge: 2021-07-18 | Disposition: A | Discharge status: Home / Self Care | Payer: BC Managed Care – PPO | Source: Ambulatory Visit | Attending: Radiation Oncology | Admitting: Radiation Oncology

## 2021-07-18 ENCOUNTER — Other Ambulatory Visit: Payer: Self-pay

## 2021-07-18 DIAGNOSIS — Z510 Encounter for antineoplastic radiation therapy: Secondary | ICD-10-CM | POA: Diagnosis not present

## 2021-07-18 DIAGNOSIS — C50411 Malignant neoplasm of upper-outer quadrant of right female breast: Secondary | ICD-10-CM | POA: Diagnosis not present

## 2021-07-18 DIAGNOSIS — C50919 Malignant neoplasm of unspecified site of unspecified female breast: Secondary | ICD-10-CM | POA: Diagnosis not present

## 2021-07-18 DIAGNOSIS — C7951 Secondary malignant neoplasm of bone: Secondary | ICD-10-CM | POA: Diagnosis not present

## 2021-07-18 DIAGNOSIS — Z5111 Encounter for antineoplastic chemotherapy: Secondary | ICD-10-CM | POA: Diagnosis not present

## 2021-07-18 DIAGNOSIS — Z170 Estrogen receptor positive status [ER+]: Secondary | ICD-10-CM | POA: Diagnosis not present

## 2021-07-21 ENCOUNTER — Other Ambulatory Visit: Payer: Self-pay

## 2021-07-21 ENCOUNTER — Ambulatory Visit
Admission: RE | Admit: 2021-07-21 | Discharge: 2021-07-21 | Disposition: A | Discharge status: Home / Self Care | Payer: BC Managed Care – PPO | Source: Ambulatory Visit | Attending: Radiation Oncology | Admitting: Radiation Oncology

## 2021-07-21 DIAGNOSIS — Z510 Encounter for antineoplastic radiation therapy: Secondary | ICD-10-CM | POA: Diagnosis not present

## 2021-07-21 DIAGNOSIS — C7951 Secondary malignant neoplasm of bone: Secondary | ICD-10-CM | POA: Diagnosis not present

## 2021-07-21 DIAGNOSIS — Z5111 Encounter for antineoplastic chemotherapy: Secondary | ICD-10-CM | POA: Diagnosis not present

## 2021-07-21 DIAGNOSIS — C50919 Malignant neoplasm of unspecified site of unspecified female breast: Secondary | ICD-10-CM | POA: Diagnosis not present

## 2021-07-21 DIAGNOSIS — C50411 Malignant neoplasm of upper-outer quadrant of right female breast: Secondary | ICD-10-CM | POA: Diagnosis not present

## 2021-07-21 DIAGNOSIS — Z170 Estrogen receptor positive status [ER+]: Secondary | ICD-10-CM | POA: Diagnosis not present

## 2021-07-21 LAB — ESTRADIOL, ULTRA SENS: Estradiol, Sensitive: 8.7 pg/mL

## 2021-07-22 ENCOUNTER — Ambulatory Visit
Admission: RE | Admit: 2021-07-22 | Discharge: 2021-07-22 | Disposition: A | Discharge status: Home / Self Care | Payer: BC Managed Care – PPO | Source: Ambulatory Visit | Attending: Radiation Oncology | Admitting: Radiation Oncology

## 2021-07-22 ENCOUNTER — Encounter: Payer: Self-pay | Admitting: *Deleted

## 2021-07-22 DIAGNOSIS — Z510 Encounter for antineoplastic radiation therapy: Secondary | ICD-10-CM | POA: Diagnosis not present

## 2021-07-22 DIAGNOSIS — C50919 Malignant neoplasm of unspecified site of unspecified female breast: Secondary | ICD-10-CM | POA: Diagnosis not present

## 2021-07-22 DIAGNOSIS — C50411 Malignant neoplasm of upper-outer quadrant of right female breast: Secondary | ICD-10-CM | POA: Diagnosis not present

## 2021-07-22 DIAGNOSIS — C7951 Secondary malignant neoplasm of bone: Secondary | ICD-10-CM | POA: Diagnosis not present

## 2021-07-22 DIAGNOSIS — Z5111 Encounter for antineoplastic chemotherapy: Secondary | ICD-10-CM | POA: Diagnosis not present

## 2021-07-22 DIAGNOSIS — Z170 Estrogen receptor positive status [ER+]: Secondary | ICD-10-CM | POA: Diagnosis not present

## 2021-07-23 ENCOUNTER — Ambulatory Visit
Admission: RE | Admit: 2021-07-23 | Discharge: 2021-07-23 | Disposition: A | Discharge status: Home / Self Care | Payer: BC Managed Care – PPO | Source: Ambulatory Visit | Attending: Radiation Oncology | Admitting: Radiation Oncology

## 2021-07-23 ENCOUNTER — Other Ambulatory Visit: Payer: Self-pay

## 2021-07-23 DIAGNOSIS — C7951 Secondary malignant neoplasm of bone: Secondary | ICD-10-CM | POA: Diagnosis not present

## 2021-07-23 DIAGNOSIS — Z170 Estrogen receptor positive status [ER+]: Secondary | ICD-10-CM | POA: Diagnosis not present

## 2021-07-23 DIAGNOSIS — C50411 Malignant neoplasm of upper-outer quadrant of right female breast: Secondary | ICD-10-CM | POA: Diagnosis not present

## 2021-07-23 DIAGNOSIS — C50919 Malignant neoplasm of unspecified site of unspecified female breast: Secondary | ICD-10-CM | POA: Diagnosis not present

## 2021-07-23 DIAGNOSIS — Z510 Encounter for antineoplastic radiation therapy: Secondary | ICD-10-CM | POA: Diagnosis not present

## 2021-07-23 DIAGNOSIS — Z5111 Encounter for antineoplastic chemotherapy: Secondary | ICD-10-CM | POA: Diagnosis not present

## 2021-07-24 ENCOUNTER — Encounter: Payer: Self-pay | Admitting: Radiation Oncology

## 2021-07-24 ENCOUNTER — Ambulatory Visit
Admission: RE | Admit: 2021-07-24 | Discharge: 2021-07-24 | Disposition: A | Discharge status: Home / Self Care | Payer: BC Managed Care – PPO | Source: Ambulatory Visit | Attending: Radiation Oncology | Admitting: Radiation Oncology

## 2021-07-24 DIAGNOSIS — C50411 Malignant neoplasm of upper-outer quadrant of right female breast: Secondary | ICD-10-CM | POA: Diagnosis not present

## 2021-07-24 DIAGNOSIS — Z5111 Encounter for antineoplastic chemotherapy: Secondary | ICD-10-CM | POA: Diagnosis not present

## 2021-07-24 DIAGNOSIS — C7951 Secondary malignant neoplasm of bone: Secondary | ICD-10-CM | POA: Diagnosis not present

## 2021-07-24 DIAGNOSIS — Z510 Encounter for antineoplastic radiation therapy: Secondary | ICD-10-CM | POA: Diagnosis not present

## 2021-07-24 DIAGNOSIS — C50919 Malignant neoplasm of unspecified site of unspecified female breast: Secondary | ICD-10-CM | POA: Diagnosis not present

## 2021-07-24 DIAGNOSIS — Z170 Estrogen receptor positive status [ER+]: Secondary | ICD-10-CM | POA: Diagnosis not present

## 2021-08-09 ENCOUNTER — Other Ambulatory Visit: Payer: Self-pay | Admitting: Hematology & Oncology

## 2021-08-09 DIAGNOSIS — C7951 Secondary malignant neoplasm of bone: Secondary | ICD-10-CM

## 2021-08-09 DIAGNOSIS — C50919 Malignant neoplasm of unspecified site of unspecified female breast: Secondary | ICD-10-CM

## 2021-08-11 ENCOUNTER — Encounter: Payer: Self-pay | Admitting: Hematology & Oncology

## 2021-08-11 MED ORDER — OXYCODONE HCL 5 MG PO TABS: ORAL_TABLET | ORAL | 0 refills | Status: DC | PRN

## 2021-08-15 ENCOUNTER — Other Ambulatory Visit (HOSPITAL_BASED_OUTPATIENT_CLINIC_OR_DEPARTMENT_OTHER): Payer: Self-pay

## 2021-08-15 MED FILL — Ondansetron HCl Tab 8 MG: ORAL | 24 days supply | Qty: 9 | Fill #7 | Status: AC

## 2021-08-18 ENCOUNTER — Encounter: Payer: Self-pay | Admitting: Hematology & Oncology

## 2021-08-18 ENCOUNTER — Inpatient Hospital Stay (HOSPITAL_BASED_OUTPATIENT_CLINIC_OR_DEPARTMENT_OTHER): Payer: BC Managed Care – PPO | Admitting: Hematology & Oncology

## 2021-08-18 ENCOUNTER — Inpatient Hospital Stay: Payer: BC Managed Care – PPO

## 2021-08-18 ENCOUNTER — Inpatient Hospital Stay: Payer: BC Managed Care – PPO | Attending: Hematology & Oncology

## 2021-08-18 ENCOUNTER — Other Ambulatory Visit: Payer: Self-pay

## 2021-08-18 ENCOUNTER — Encounter: Payer: Self-pay | Admitting: *Deleted

## 2021-08-18 VITALS — BP 141/85 | HR 67 | Temp 98.2°F | Resp 16 | Wt 196.0 lb

## 2021-08-18 DIAGNOSIS — C50919 Malignant neoplasm of unspecified site of unspecified female breast: Secondary | ICD-10-CM | POA: Insufficient documentation

## 2021-08-18 DIAGNOSIS — Z170 Estrogen receptor positive status [ER+]: Secondary | ICD-10-CM | POA: Diagnosis not present

## 2021-08-18 DIAGNOSIS — C7951 Secondary malignant neoplasm of bone: Secondary | ICD-10-CM

## 2021-08-18 DIAGNOSIS — Z5111 Encounter for antineoplastic chemotherapy: Secondary | ICD-10-CM | POA: Insufficient documentation

## 2021-08-18 LAB — CBC WITH DIFFERENTIAL (CANCER CENTER ONLY)
Abs Immature Granulocytes: 0.01 10*3/uL (ref 0.00–0.07)
Basophils Absolute: 0.1 10*3/uL (ref 0.0–0.1)
Basophils Relative: 3 %
Eosinophils Absolute: 0.1 10*3/uL (ref 0.0–0.5)
Eosinophils Relative: 7 %
HCT: 35.9 % — ABNORMAL LOW (ref 36.0–46.0)
Hemoglobin: 12.9 g/dL (ref 12.0–15.0)
Immature Granulocytes: 1 %
Lymphocytes Relative: 20 %
Lymphs Abs: 0.4 10*3/uL — ABNORMAL LOW (ref 0.7–4.0)
MCH: 36.1 pg — ABNORMAL HIGH (ref 26.0–34.0)
MCHC: 35.9 g/dL (ref 30.0–36.0)
MCV: 100.6 fL — ABNORMAL HIGH (ref 80.0–100.0)
Monocytes Absolute: 0.1 10*3/uL (ref 0.1–1.0)
Monocytes Relative: 6 %
Neutro Abs: 1.2 10*3/uL — ABNORMAL LOW (ref 1.7–7.7)
Neutrophils Relative %: 63 %
Platelet Count: 119 10*3/uL — ABNORMAL LOW (ref 150–400)
RBC: 3.57 MIL/uL — ABNORMAL LOW (ref 3.87–5.11)
RDW: 13.2 % (ref 11.5–15.5)
WBC Count: 1.9 10*3/uL — ABNORMAL LOW (ref 4.0–10.5)
nRBC: 0 % (ref 0.0–0.2)

## 2021-08-18 LAB — CMP (CANCER CENTER ONLY)
ALT: 11 U/L (ref 0–44)
AST: 21 U/L (ref 15–41)
Albumin: 4.4 g/dL (ref 3.5–5.0)
Alkaline Phosphatase: 55 U/L (ref 38–126)
Anion gap: 9 (ref 5–15)
BUN: 18 mg/dL (ref 6–20)
CO2: 27 mmol/L (ref 22–32)
Calcium: 9.7 mg/dL (ref 8.9–10.3)
Chloride: 105 mmol/L (ref 98–111)
Creatinine: 0.82 mg/dL (ref 0.44–1.00)
GFR, Estimated: >60 mL/min (ref >60)
Glucose, Bld: 99 mg/dL (ref 70–99)
Potassium: 4.3 mmol/L (ref 3.5–5.1)
Sodium: 141 mmol/L (ref 135–145)
Total Bilirubin: 0.7 mg/dL (ref 0.3–1.2)
Total Protein: 6.5 g/dL (ref 6.5–8.1)

## 2021-08-18 MED ORDER — FULVESTRANT 250 MG/5ML IM SOSY
500.0000 mg | PREFILLED_SYRINGE | Freq: Once | INTRAMUSCULAR | Status: AC
  Administered 2021-08-18: 500 mg via INTRAMUSCULAR
  Filled 2021-08-18: qty 10

## 2021-08-18 MED ORDER — ZOLEDRONIC ACID 4 MG/100ML IV SOLN
4.0000 mg | Freq: Once | INTRAVENOUS | Status: AC
  Administered 2021-08-18: 4 mg via INTRAVENOUS
  Filled 2021-08-18: qty 100

## 2021-08-18 NOTE — Progress Notes (Signed)
Patient will need a PET scan before her next follow up appointment. Scheduled for 09/12/2021.  Called and reviewed appointment date, time and location with patient. Reviewed arrive time and NPO status. Radiology info sheet also mailed to patient home for reinforcement.  Oncology Nurse Navigator Documentation  Oncology Nurse Navigator Flowsheets 08/18/2021  Abnormal Finding Date -  Confirmed Diagnosis Date -  Diagnosis Status -  Planned Course of Treatment -  Phase of Treatment -  Chemotherapy Pending- Reason: -  Chemotherapy Actual Start Date: -  Radiation Actual Start Date: -  Radiation Expected End Date: -  Navigator Follow Up Date: 09/12/2021  Navigator Follow Up Reason: Scan Review  Navigator Location [facility]  Navigator Encounter Type Appt/Treatment Plan Review;Telephone  Telephone Appt Confirmation/Clarification;Outgoing Call  Treatment Initiated Date -  Patient Visit Type MedOnc  Treatment Phase Active Tx  Barriers/Navigation Needs Coordination of Care;Education  Education Other  Interventions Coordination of Care;Education;Psycho-Social Support  Acuity Level 2-Minimal Needs (1-2 Barriers Identified)  Referrals -  Coordination of Care Appts;Radiology  Education Method Verbal;Teach-back;Written  Support Groups/Services Friends and Family  Time Spent with Patient 30

## 2021-08-18 NOTE — Patient Instructions (Signed)
Fulvestrant injection °What is this medication? °FULVESTRANT (ful VES trant) blocks the effects of estrogen. It is used to treat breast cancer. °This medicine may be used for other purposes; ask your health care provider or pharmacist if you have questions. °COMMON BRAND NAME(S): FASLODEX °What should I tell my care team before I take this medication? °They need to know if you have any of these conditions: °bleeding disorders °liver disease °low blood counts, like low white cell, platelet, or red cell counts °an unusual or allergic reaction to fulvestrant, other medicines, foods, dyes, or preservatives °pregnant or trying to get pregnant °breast-feeding °How should I use this medication? °This medicine is for injection into a muscle. It is usually given by a health care professional in a hospital or clinic setting. °Talk to your pediatrician regarding the use of this medicine in children. Special care may be needed. °Overdosage: If you think you have taken too much of this medicine contact a poison control center or emergency room at once. °NOTE: This medicine is only for you. Do not share this medicine with others. °What if I miss a dose? °It is important not to miss your dose. Call your doctor or health care professional if you are unable to keep an appointment. °What may interact with this medication? °medicines that treat or prevent blood clots like warfarin, enoxaparin, dalteparin, apixaban, dabigatran, and rivaroxaban °This list may not describe all possible interactions. Give your health care provider a list of all the medicines, herbs, non-prescription drugs, or dietary supplements you use. Also tell them if you smoke, drink alcohol, or use illegal drugs. Some items may interact with your medicine. °What should I watch for while using this medication? °Your condition will be monitored carefully while you are receiving this medicine. You will need important blood work done while you are taking this  medicine. °Do not become pregnant while taking this medicine or for at least 1 year after stopping it. Women of child-bearing potential will need to have a negative pregnancy test before starting this medicine. Women should inform their doctor if they wish to become pregnant or think they might be pregnant. There is a potential for serious side effects to an unborn child. Men should inform their doctors if they wish to father a child. This medicine may lower sperm counts. Talk to your health care professional or pharmacist for more information. Do not breast-feed an infant while taking this medicine or for 1 year after the last dose. °What side effects may I notice from receiving this medication? °Side effects that you should report to your doctor or health care professional as soon as possible: °allergic reactions like skin rash, itching or hives, swelling of the face, lips, or tongue °feeling faint or lightheaded, falls °pain, tingling, numbness, or weakness in the legs °signs and symptoms of infection like fever or chills; cough; flu-like symptoms; sore throat °vaginal bleeding °Side effects that usually do not require medical attention (report to your doctor or health care professional if they continue or are bothersome): °aches, pains °constipation °diarrhea °headache °hot flashes °nausea, vomiting °pain at site where injected °stomach pain °This list may not describe all possible side effects. Call your doctor for medical advice about side effects. You may report side effects to FDA at 1-800-FDA-1088. °Where should I keep my medication? °This drug is given in a hospital or clinic and will not be stored at home. °NOTE: This sheet is a summary. It may not cover all possible information. If you have   questions about this medicine, talk to your doctor, pharmacist, or health care provider. °© 2022 [employer]/Gold Standard (2018-01-11 00:00:00) °Zoledronic Acid Injection (Hypercalcemia, Oncology) °What is this  medication? °ZOLEDRONIC ACID (ZOE le dron ik AS id) slows calcium loss from bones. It high calcium levels in the blood from some kinds of cancer. It may be used in other people at risk for bone loss. °This medicine may be used for other purposes; ask your health care provider or pharmacist if you have questions. °COMMON BRAND NAME(S): Zometa °What should I tell my care team before I take this medication? °They need to know if you have any of these conditions: °cancer °dehydration °dental disease °kidney disease °liver disease °low levels of calcium in the blood °lung or breathing disease (asthma) °receiving steroids like dexamethasone or prednisone °an unusual or allergic reaction to zoledronic acid, other medicines, foods, dyes, or preservatives °pregnant or trying to get pregnant °breast-feeding °How should I use this medication? °This drug is injected into a vein. It is given by a health care provider in a hospital or clinic setting. °Talk to your health care provider about the use of this drug in children. Special care may be needed. °Overdosage: If you think you have taken too much of this medicine contact a poison control center or emergency room at once. °NOTE: This medicine is only for you. Do not share this medicine with others. °What if I miss a dose? °Keep appointments for follow-up doses. It is important not to miss your dose. Call your health care provider if you are unable to keep an appointment. °What may interact with this medication? °certain antibiotics given by injection °NSAIDs, medicines for pain and inflammation, like ibuprofen or naproxen °some diuretics like bumetanide, furosemide °teriparatide °thalidomide °This list may not describe all possible interactions. Give your health care provider a list of all the medicines, herbs, non-prescription drugs, or dietary supplements you use. Also tell them if you smoke, drink alcohol, or use illegal drugs. Some items may interact with your  medicine. °What should I watch for while using this medication? °Visit your health care provider for regular checks on your progress. It may be some time before you see the benefit from this drug. °Some people who take this drug have severe bone, joint, or muscle pain. This drug may also increase your risk for jaw problems or a broken thigh bone. Tell your health care provider right away if you have severe pain in your jaw, bones, joints, or muscles. Tell you health care provider if you have any pain that does not go away or that gets worse. °Tell your dentist and dental surgeon that you are taking this drug. You should not have major dental surgery while on this drug. See your dentist to have a dental exam and fix any dental problems before starting this drug. Take good care of your teeth while on this drug. Make sure you see your dentist for regular follow-up appointments. °You should make sure you get enough calcium and vitamin D while you are taking this drug. Discuss the foods you eat and the vitamins you take with your health care provider. °Check with your health care provider if you have severe diarrhea, nausea, and vomiting, or if you sweat a lot. The loss of too much body fluid may make it dangerous for you to take this drug. °You may need blood work done while you are taking this drug. °Do not become pregnant while taking this drug. Women should inform their   health care provider if they wish to become pregnant or think they might be pregnant. There is potential for serious harm to an unborn child. Talk to your health care provider for more information. °What side effects may I notice from receiving this medication? °Side effects that you should report to your doctor or health care provider as soon as possible: °allergic reactions (skin rash, itching or hives; swelling of the face, lips, or tongue) °bone pain °infection (fever, chills, cough, sore throat, pain or trouble passing urine) °jaw pain,  especially after dental work °joint pain °kidney injury (trouble passing urine or change in the amount of urine) °low blood pressure (dizziness; feeling faint or lightheaded, falls; unusually weak or tired) °low calcium levels (fast heartbeat; muscle cramps or pain; pain, tingling, or numbness in the hands or feet; seizures) °low magnesium levels (fast, irregular heartbeat; muscle cramp or pain; muscle weakness; tremors; seizures) °low red blood cell counts (trouble breathing; feeling faint; lightheaded, falls; unusually weak or tired) °muscle pain °redness, blistering, peeling, or loosening of the skin, including inside the mouth °severe diarrhea °swelling of the ankles, feet, hands °trouble breathing °Side effects that usually do not require medical attention (report to your doctor or health care provider if they continue or are bothersome): °anxious °constipation °coughing °depressed mood °eye irritation, itching, or pain °fever °general ill feeling or flu-like symptoms °nausea °pain, redness, or irritation at site where injected °trouble sleeping °This list may not describe all possible side effects. Call your doctor for medical advice about side effects. You may report side effects to FDA at 1-800-FDA-1088. °Where should I keep my medication? °This drug is given in a hospital or clinic. It will not be stored at home. °NOTE: This sheet is a summary. It may not cover all possible information. If you have questions about this medicine, talk to your doctor, pharmacist, or health care provider. °© 2022 Elsevier/Gold Standard (2021-06-17 00:00:00) ° °

## 2021-08-18 NOTE — Progress Notes (Signed)
Hematology and Oncology Follow Up Visit  Annette James [id number] 10/01/1961 60 y.o. 08/18/2021   Principle Diagnosis:  Metastatic breast cancer-bone only metastasis-ER positive/PR positive/HER2 LOW ///  PIK3CA (+)   Current Therapy:        Faslodex 500 mg IM monthly-start on 11/06/2020 Ribociclib 600 mg p.o. daily (21 days on/7 days off)-start on 11/06/2020 XRT to the RIGHT hip  --completed on 07/24/2021 Zometa 4 mg IV q. 3 months -- next dose on 11/2021   Interim History:  Annette James is here today for follow-up.  She did have a wonderful birthday.  Her family took her out for dinner.  She had a game night at home.  She really had a wonderful time.  She completed radiation to the right hip.  She did well with this.  There is no pain.  She has had no problems with bowels or bladder.  Is going to the bathroom okay.  There is no cough or shortness of breath.  She is still working.  [name], [name]  She has to give a talk.  I told her that it would be okay to do that from my point of view.  Her last CA 27.29 was holding steady at 25.7.  She has had no rashes.  There is been no leg swelling.  She has had no headache.  Overall, performance status is ECOG 1.    Medications:  Allergies as of 08/18/2021   No Known Allergies      Medication List        Accurate as of August 18, 2021  8:28 AM. If you have any questions, ask your nurse or doctor.          ascorbic acid 500 MG tablet Commonly known as: VITAMIN C Take 500 mg by mouth daily.   cyclobenzaprine 5 MG tablet Commonly known as: FLEXERIL TAKE 1 TABLET BY MOUTH THREE TIMES A DAY AS NEEDED FOR MUSCLE SPASMS   fentaNYL 50 MCG/HR Commonly known as: [place] 1 patch onto the skin every other day.   Ibrance 125 MG tablet Generic drug: palbociclib TAKE 1 TABLET BY MOUTH ONCE DAILY WITH OR WITHOUT FOOD  FOR 21 DAYS, FOLLOWED BY 7  DAYS OFF, REPEATED EVERY 28  DAYS   ondansetron 8 MG tablet Commonly known as: ZOFRAN TAKE 1 TABLET (8 MG TOTAL) BY MOUTH EVERY 8 (EIGHT) HOURS AS NEEDED FOR NAUSEA OR VOMITING.   One-A-Day Womens 50 Plus Tabs Take 1 tablet by mouth daily.   oxyCODONE 5 MG immediate release tablet Commonly known as: Oxy IR/ROXICODONE TAKE 1-2 TABLETS BY MOUTH EVERY 4 HOURS AS NEEDED        Allergies: No Known Allergies  Past Medical History, Surgical history, Social history, and Family History were reviewed and updated.  Review of Systems: Review of Systems  Constitutional: Negative.   HENT: Negative.    Eyes: Negative.   Respiratory: Negative.    Cardiovascular: Negative.   Gastrointestinal: Negative.   Genitourinary: Negative.   Musculoskeletal:  Positive for joint pain and neck pain.  Skin: Negative.   Neurological: Negative.   Endo/Heme/Allergies: Negative.   Psychiatric/Behavioral: Negative.       Physical Exam:  vitals were not taken for this visit.   Wt Readings from Last 3 Encounters:  07/16/21 203 lb (92.1 kg)  06/23/21 205 lb 12.8 oz (93.4 kg)  06/17/21 201 lb 4 oz (91.3 kg)    Physical Exam Vitals reviewed.  HENT:     Head: Normocephalic and atraumatic.  Eyes:     Pupils: Pupils are equal, round, and reactive to light.  Cardiovascular:     Rate and Rhythm: Normal rate and regular rhythm.     Heart sounds: Normal heart sounds.  Pulmonary:     Effort: Pulmonary effort is normal.     Breath sounds: Normal breath sounds.  Abdominal:     General: Bowel sounds are normal.     Palpations: Abdomen is soft.  Musculoskeletal:        General: No tenderness or deformity. Normal range of motion.     Cervical back: Normal range of motion.  Lymphadenopathy:     Cervical: No cervical adenopathy.  Skin:    General: Skin is warm and dry.     Findings: No erythema or rash.  Neurological:     Mental Status: She is alert and oriented to person, place, and time.  Psychiatric:        Behavior: Behavior  normal.        Thought Content: Thought content normal.        Judgment: Judgment normal.     Lab Results  Component Value Date   WBC 2.1 (L) 07/16/2021   HGB 12.6 07/16/2021   HCT 36.0 07/16/2021   MCV 101.1 (H) 07/16/2021   PLT 194 07/16/2021   Lab Results  Component Value Date   FERRITIN 512 (H) 12/04/2020   IRON 96 12/04/2020   TIBC 244 12/04/2020   UIBC 147 12/04/2020   IRONPCTSAT 40 12/04/2020   Lab Results  Component Value Date   RBC 3.56 (L) 07/16/2021   Lab Results  Component Value Date   KAPLAMBRATIO 4.99 10/20/2020   Lab Results  Component Value Date   IGGSERUM 714 10/19/2020   IGA 153 10/19/2020   IGMSERUM 76 10/19/2020   Lab Results  Component Value Date   TOTALPROTELP 6.1 10/19/2020     Chemistry      Component Value Date/Time   NA 142 07/16/2021 0737   K 3.7 07/16/2021 0737   CL 104 07/16/2021 0737   CO2 31 07/16/2021 0737   BUN 14 07/16/2021 0737   CREATININE 0.70 07/16/2021 0737      Component Value Date/Time   CALCIUM 9.9 07/16/2021 0737   ALKPHOS 73 07/16/2021 0737   AST 18 07/16/2021 0737   ALT 13 07/16/2021 0737   BILITOT 0.5 07/16/2021 0737       Impression and Plan: Annette James is a very pleasant 60 yo caucasian female with metastatic breast cancer confined at this time to her bones.  She was diagnosed back in December 2021.  She received radiation therapy to the right hip.  This is the only area of activity that we saw on the PET scan.  She really had no symptoms over there.  We will go ahead and set her up with another PET scan.  The last 1 was back in August.  We will have to see how this 1 looks.  Hopefully it will look okay without any evidence of progressive disease.  Her quality of life is doing great.  I am so happy that she is able to do what she would like.  She will get her Zometa today.  We will plan to get her back in 1 more month.     Volanda Napoleon, MD 11/7/20228:28 AM

## 2021-08-19 LAB — CANCER ANTIGEN 27.29: CA 27.29: 31.9 U/mL (ref 0.0–38.6)

## 2021-08-20 ENCOUNTER — Encounter: Payer: Self-pay | Admitting: Radiology

## 2021-08-23 NOTE — Progress Notes (Incomplete)
  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 She denies pain, reports having mild fatigue. Patient denies any bladder issue. Reports having diarrhea, and nausea.  Patient took Zofran for nausea which is effective. Encouraged patient to have Imodium on hand, patient voiced understanding. Skin remains intact.   Plan: The patient will follow-up with radiation oncology in one month .  ________________________________________________ -----------------------------------  Blair Promise, PhD, MD  This document serves as a record of services personally performed by Gery Pray, MD. It was created on his behalf by Roney Mans, a trained medical scribe. The creation of this record is based on the scribe's personal observations and the provider's statements to them. This document has been checked and approved by the attending provider.

## 2021-08-24 NOTE — Progress Notes (Signed)
Radiation Oncology         [phone] ________________________________  Name: Annette James MRN: [id number]  Date: 08/25/2021  DOB: [birth year]  Follow-Up Visit Note  CC: Annette Lima, MD  Volanda Napoleon, MD    ICD-10-CM   1. 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 She tolerated radiation therapy relatively well and denied pain and bladder issues. She reported having mild fatigue, diarrhea, and nausea.  Patient took Zofran for nausea which was effective. I encouraged patient to have Imodium on hand to which she voiced understanding.   Since she was seen for consultation, the patient met with Dr. Marin Olp on 07/16/21. During which time, the patient reported lethargy during radiation treatment and nausea. Otherwise, the patient was noted to be doing well overall. The patient again followed up with Dr. Marin Olp on 08/18/21. During which time, the patient reported no symptoms of concern. The patient will continue on Zometa every 3 months, next dose expected on 11/2021.   The patient is problems with nausea and fatigue have decided at this time as well as diarrhea.  She denies any pain along the pelvis region.   Allergies:  has No Known Allergies.  Meds: Current Outpatient Medications  Medication Sig Dispense Refill   ascorbic acid (VITAMIN C) 500 MG tablet Take 500 mg by mouth daily.     cyclobenzaprine (FLEXERIL) 5 MG tablet TAKE 1 TABLET BY MOUTH THREE TIMES A DAY AS NEEDED FOR MUSCLE  SPASMS 30 tablet 2   fentaNYL (DURAGESIC) 50 MCG/HR Place 1 patch onto the skin every other day. 15 patch 0   IBRANCE 125 MG tablet TAKE 1 TABLET BY MOUTH ONCE DAILY WITH OR WITHOUT FOOD  FOR 21 DAYS, FOLLOWED BY 7  DAYS OFF, REPEATED EVERY 28 DAYS 21 tablet 4   Multiple Vitamins-Minerals (ONE-A-DAY WOMENS 50 PLUS) TABS Take 1 tablet by mouth daily.     ondansetron (ZOFRAN) 8 MG tablet TAKE 1 TABLET (8 MG TOTAL) BY MOUTH EVERY 8 (EIGHT) HOURS AS NEEDED FOR NAUSEA OR VOMITING. 30 tablet 3   oxyCODONE (OXY IR/ROXICODONE) 5 MG immediate release tablet TAKE 1-2 TABLETS BY MOUTH EVERY 4 HOURS AS NEEDED 60 tablet 0   No current facility-administered medications for this encounter.    Physical Findings: The patient is in no acute distress. Patient is alert and oriented.  height is _0  (1.727 m) and weight is 196 lb 6 oz (89.1 kg). Her temporal temperature is 97.2 F (36.2 C) (abnormal). Her blood pressure is 154/92 (abnormal) and her pulse is 76. Her respiration is 18 and oxygen saturation is 99%. .   Lungs are clear to auscultation bilaterally. Heart has regular rate and rhythm. No palpable cervical, supraclavicular, or axillary adenopathy. Abdomen soft, non-tender, normal bowel sounds.    Lab Findings: Lab Results  Component Value Date   WBC 1.9 (L) 08/18/2021   HGB 12.9 08/18/2021   HCT 35.9 (L) 08/18/2021   MCV 100.6 (H) 08/18/2021  PLT 119 (L) 08/18/2021    Radiographic Findings: No results found.  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  Her symptoms are minimal at this time.  Plan: As needed follow-up in radiation oncology.  The patient will continue close follow-up in medical oncology above.    ____________________________________  Blair Promise, PhD, MD   This document serves as a record of services personally performed by Gery Pray, MD. It was created on his behalf by Roney Mans, a trained medical scribe. The creation of this record is based on the scribe's personal observations and the provider's statements to them. This document has been checked and approved by the attending provider.

## 2021-08-25 ENCOUNTER — Ambulatory Visit
Admission: RE | Admit: 2021-08-25 | Discharge: 2021-08-25 | Disposition: A | Discharge status: Home / Self Care | Payer: BC Managed Care – PPO | Source: Ambulatory Visit | Attending: Radiation Oncology | Admitting: Radiation Oncology

## 2021-08-25 ENCOUNTER — Encounter: Payer: Self-pay | Admitting: Radiation Oncology

## 2021-08-25 VITALS — BP 154/92 | HR 76 | Temp 97.2°F | Resp 18 | Ht 68.0 in | Wt 196.4 lb

## 2021-08-25 DIAGNOSIS — R110 Nausea: Secondary | ICD-10-CM | POA: Insufficient documentation

## 2021-08-25 DIAGNOSIS — Z923 Personal history of irradiation: Secondary | ICD-10-CM | POA: Insufficient documentation

## 2021-08-25 DIAGNOSIS — R197 Diarrhea, unspecified: Secondary | ICD-10-CM | POA: Insufficient documentation

## 2021-08-25 DIAGNOSIS — R5383 Other fatigue: Secondary | ICD-10-CM | POA: Insufficient documentation

## 2021-08-25 DIAGNOSIS — Z79899 Other long term (current) drug therapy: Secondary | ICD-10-CM | POA: Diagnosis not present

## 2021-08-25 DIAGNOSIS — Z170 Estrogen receptor positive status [ER+]: Secondary | ICD-10-CM | POA: Insufficient documentation

## 2021-08-25 DIAGNOSIS — C50411 Malignant neoplasm of upper-outer quadrant of right female breast: Secondary | ICD-10-CM | POA: Diagnosis not present

## 2021-08-25 DIAGNOSIS — C7951 Secondary malignant neoplasm of bone: Secondary | ICD-10-CM | POA: Diagnosis not present

## 2021-08-25 DIAGNOSIS — C50919 Malignant neoplasm of unspecified site of unspecified female breast: Secondary | ICD-10-CM

## 2021-08-25 NOTE — Progress Notes (Signed)
Annette James is here today for follow up post radiation to the right hip.  They completed their radiation on: 07/24/2021   Does the patient complain of any of the following:  Pain:denies Abdominal bloating: denies Diarrhea/Constipation: denies Nausea/Vomiting: denies Vaginal Discharge: denies Blood in Urine or Stool: denies Urinary Issues (dysuria/incomplete emptying/ incontinence/ increased frequency/urgency): nocturia x1-2 Post radiation skin changes: denies   Additional comments if applicable: BP is high this visit. Patient has spoken with Dr Marin Olp about this and he is not concerned at this time. No other concerns at this time.  Vitals:   08/25/21 1044  BP: (!) 154/92  Pulse: 76  Resp: 18  Temp: (!) 97.2 F (36.2 C)  TempSrc: Temporal  SpO2: 99%  Weight: 196 lb 6 oz (89.1 kg)  Height: 5\' 8"  (1.727 m)

## 2021-09-08 ENCOUNTER — Other Ambulatory Visit: Payer: Self-pay | Admitting: Hematology & Oncology

## 2021-09-08 DIAGNOSIS — C7951 Secondary malignant neoplasm of bone: Secondary | ICD-10-CM

## 2021-09-08 DIAGNOSIS — C50919 Malignant neoplasm of unspecified site of unspecified female breast: Secondary | ICD-10-CM

## 2021-09-08 MED ORDER — OXYCODONE HCL 5 MG PO TABS: ORAL_TABLET | ORAL | 0 refills | Status: DC | PRN

## 2021-09-08 MED ORDER — FENTANYL 50 MCG/HR TD PT72: 1.0000 | MEDICATED_PATCH | TRANSDERMAL | 0 refills | Status: DC

## 2021-09-08 NOTE — Telephone Encounter (Signed)
Refill requests for Oxycodone 5 mg #60 and Fentanyl patches #15. Last OV 08/18/21 and next scheduled 09/16/21. Please advise for refills, thanks!

## 2021-09-09 ENCOUNTER — Other Ambulatory Visit: Payer: Self-pay | Admitting: Hematology & Oncology

## 2021-09-09 DIAGNOSIS — C50919 Malignant neoplasm of unspecified site of unspecified female breast: Secondary | ICD-10-CM

## 2021-09-09 DIAGNOSIS — C7951 Secondary malignant neoplasm of bone: Secondary | ICD-10-CM

## 2021-09-09 NOTE — Telephone Encounter (Signed)
Refill request for Flexeril 5 mg # 30 w/ 2 refills. Last refilled 06/06/21. Last OV 08/18/21 - next 09/16/21. Please advise, if ok to refill. Thanks!

## 2021-09-12 ENCOUNTER — Ambulatory Visit (HOSPITAL_COMMUNITY)
Admission: RE | Admit: 2021-09-12 | Discharge: 2021-09-12 | Disposition: A | Discharge status: Home / Self Care | Payer: BC Managed Care – PPO | Source: Ambulatory Visit | Attending: Hematology & Oncology | Admitting: Hematology & Oncology

## 2021-09-12 DIAGNOSIS — Z9049 Acquired absence of other specified parts of digestive tract: Secondary | ICD-10-CM | POA: Diagnosis not present

## 2021-09-12 DIAGNOSIS — C50919 Malignant neoplasm of unspecified site of unspecified female breast: Secondary | ICD-10-CM | POA: Insufficient documentation

## 2021-09-12 DIAGNOSIS — C7951 Secondary malignant neoplasm of bone: Secondary | ICD-10-CM | POA: Insufficient documentation

## 2021-09-12 LAB — GLUCOSE, CAPILLARY: Glucose-Capillary: 110 mg/dL — ABNORMAL HIGH (ref 70–99)

## 2021-09-12 MED ORDER — FLUDEOXYGLUCOSE F - 18 (FDG) INJECTION
10.2000 | Freq: Once | INTRAVENOUS | Status: AC
  Administered 2021-09-12: 9.8 via INTRAVENOUS

## 2021-09-15 ENCOUNTER — Other Ambulatory Visit (HOSPITAL_BASED_OUTPATIENT_CLINIC_OR_DEPARTMENT_OTHER): Payer: Self-pay

## 2021-09-15 MED FILL — Ondansetron HCl Tab 8 MG: ORAL | 24 days supply | Qty: 9 | Fill #8 | Status: AC

## 2021-09-16 ENCOUNTER — Inpatient Hospital Stay (HOSPITAL_BASED_OUTPATIENT_CLINIC_OR_DEPARTMENT_OTHER): Payer: BC Managed Care – PPO | Admitting: Hematology & Oncology

## 2021-09-16 ENCOUNTER — Inpatient Hospital Stay: Payer: BC Managed Care – PPO

## 2021-09-16 ENCOUNTER — Encounter: Payer: Self-pay | Admitting: Hematology & Oncology

## 2021-09-16 ENCOUNTER — Other Ambulatory Visit: Payer: Self-pay

## 2021-09-16 ENCOUNTER — Encounter: Payer: Self-pay | Admitting: *Deleted

## 2021-09-16 ENCOUNTER — Inpatient Hospital Stay: Payer: BC Managed Care – PPO | Attending: Hematology & Oncology

## 2021-09-16 VITALS — BP 154/92 | HR 76 | Temp 98.4°F | Resp 18 | Wt 195.8 lb

## 2021-09-16 DIAGNOSIS — C50919 Malignant neoplasm of unspecified site of unspecified female breast: Secondary | ICD-10-CM | POA: Insufficient documentation

## 2021-09-16 DIAGNOSIS — Z5111 Encounter for antineoplastic chemotherapy: Secondary | ICD-10-CM | POA: Diagnosis not present

## 2021-09-16 DIAGNOSIS — C7951 Secondary malignant neoplasm of bone: Secondary | ICD-10-CM | POA: Diagnosis not present

## 2021-09-16 DIAGNOSIS — Z170 Estrogen receptor positive status [ER+]: Secondary | ICD-10-CM | POA: Insufficient documentation

## 2021-09-16 DIAGNOSIS — M2550 Pain in unspecified joint: Secondary | ICD-10-CM | POA: Diagnosis not present

## 2021-09-16 DIAGNOSIS — M542 Cervicalgia: Secondary | ICD-10-CM | POA: Insufficient documentation

## 2021-09-16 DIAGNOSIS — R978 Other abnormal tumor markers: Secondary | ICD-10-CM | POA: Diagnosis not present

## 2021-09-16 DIAGNOSIS — Z923 Personal history of irradiation: Secondary | ICD-10-CM | POA: Insufficient documentation

## 2021-09-16 LAB — CMP (CANCER CENTER ONLY)
ALT: 9 U/L (ref 0–44)
AST: 19 U/L (ref 15–41)
Albumin: 4.3 g/dL (ref 3.5–5.0)
Alkaline Phosphatase: 68 U/L (ref 38–126)
Anion gap: 9 (ref 5–15)
BUN: 13 mg/dL (ref 6–20)
CO2: 28 mmol/L (ref 22–32)
Calcium: 9.8 mg/dL (ref 8.9–10.3)
Chloride: 105 mmol/L (ref 98–111)
Creatinine: 0.74 mg/dL (ref 0.44–1.00)
GFR, Estimated: >60 mL/min (ref >60)
Glucose, Bld: 111 mg/dL — ABNORMAL HIGH (ref 70–99)
Potassium: 4.2 mmol/L (ref 3.5–5.1)
Sodium: 142 mmol/L (ref 135–145)
Total Bilirubin: 0.7 mg/dL (ref 0.3–1.2)
Total Protein: 6.5 g/dL (ref 6.5–8.1)

## 2021-09-16 LAB — CBC WITH DIFFERENTIAL (CANCER CENTER ONLY)
Abs Immature Granulocytes: 0.01 10*3/uL (ref 0.00–0.07)
Basophils Absolute: 0.1 10*3/uL (ref 0.0–0.1)
Basophils Relative: 3 %
Eosinophils Absolute: 0.3 10*3/uL (ref 0.0–0.5)
Eosinophils Relative: 13 %
HCT: 36.1 % (ref 36.0–46.0)
Hemoglobin: 12.7 g/dL (ref 12.0–15.0)
Immature Granulocytes: 0 %
Lymphocytes Relative: 22 %
Lymphs Abs: 0.5 10*3/uL — ABNORMAL LOW (ref 0.7–4.0)
MCH: 35.5 pg — ABNORMAL HIGH (ref 26.0–34.0)
MCHC: 35.2 g/dL (ref 30.0–36.0)
MCV: 100.8 fL — ABNORMAL HIGH (ref 80.0–100.0)
Monocytes Absolute: 0.2 10*3/uL (ref 0.1–1.0)
Monocytes Relative: 7 %
Neutro Abs: 1.4 10*3/uL — ABNORMAL LOW (ref 1.7–7.7)
Neutrophils Relative %: 55 %
Platelet Count: 145 10*3/uL — ABNORMAL LOW (ref 150–400)
RBC: 3.58 MIL/uL — ABNORMAL LOW (ref 3.87–5.11)
RDW: 13.3 % (ref 11.5–15.5)
WBC Count: 2.5 10*3/uL — ABNORMAL LOW (ref 4.0–10.5)
nRBC: 0 % (ref 0.0–0.2)

## 2021-09-16 LAB — LACTATE DEHYDROGENASE: LDH: 197 U/L — ABNORMAL HIGH (ref 98–192)

## 2021-09-16 MED ORDER — FULVESTRANT 250 MG/5ML IM SOSY
500.0000 mg | PREFILLED_SYRINGE | Freq: Once | INTRAMUSCULAR | Status: AC
  Administered 2021-09-16: 500 mg via INTRAMUSCULAR
  Filled 2021-09-16: qty 10

## 2021-09-16 NOTE — Progress Notes (Signed)
Oncology Nurse Navigator Documentation  Oncology Nurse Navigator Flowsheets 09/16/2021  Abnormal Finding Date -  Confirmed Diagnosis Date -  Diagnosis Status -  Planned Course of Treatment -  Phase of Treatment -  Chemotherapy Pending- Reason: -  Chemotherapy Actual Start Date: -  Radiation Actual Start Date: -  Radiation Expected End Date: -  Navigator Follow Up Date: 10/21/2021  Navigator Follow Up Reason: Follow-up Appointment;Chemotherapy  Navigator Restaurant manager, fast food Encounter Type Scan Review;Appt/Treatment Plan Review  Telephone -  Treatment Initiated Date -  Patient Visit Type MedOnc  Treatment Phase Active Tx  Barriers/Navigation Needs Coordination of Care;Education  Education -  Interventions None Required  Acuity Level 2-Minimal Needs (1-2 Barriers Identified)  Referrals -  Coordination of Care -  Education Method -  Support Groups/Services Friends and Family  Time Spent with Patient 15

## 2021-09-16 NOTE — Patient Instructions (Signed)
Fulvestrant injection °What is this medication? °FULVESTRANT (ful VES trant) blocks the effects of estrogen. It is used to treat breast cancer. °This medicine may be used for other purposes; ask your health care provider or pharmacist if you have questions. °COMMON BRAND NAME(S): FASLODEX °What should I tell my care team before I take this medication? °They need to know if you have any of these conditions: °bleeding disorders °liver disease °low blood counts, like low white cell, platelet, or red cell counts °an unusual or allergic reaction to fulvestrant, other medicines, foods, dyes, or preservatives °pregnant or trying to get pregnant °breast-feeding °How should I use this medication? °This medicine is for injection into a muscle. It is usually given by a health care professional in a hospital or clinic setting. °Talk to your pediatrician regarding the use of this medicine in children. Special care may be needed. °Overdosage: If you think you have taken too much of this medicine contact a poison control center or emergency room at once. °NOTE: This medicine is only for you. Do not share this medicine with others. °What if I miss a dose? °It is important not to miss your dose. Call your doctor or health care professional if you are unable to keep an appointment. °What may interact with this medication? °medicines that treat or prevent blood clots like warfarin, enoxaparin, dalteparin, apixaban, dabigatran, and rivaroxaban °This list may not describe all possible interactions. Give your health care provider a list of all the medicines, herbs, non-prescription drugs, or dietary supplements you use. Also tell them if you smoke, drink alcohol, or use illegal drugs. Some items may interact with your medicine. °What should I watch for while using this medication? °Your condition will be monitored carefully while you are receiving this medicine. You will need important blood work done while you are taking this  medicine. °Do not become pregnant while taking this medicine or for at least 1 year after stopping it. Women of child-bearing potential will need to have a negative pregnancy test before starting this medicine. Women should inform their doctor if they wish to become pregnant or think they might be pregnant. There is a potential for serious side effects to an unborn child. Men should inform their doctors if they wish to father a child. This medicine may lower sperm counts. Talk to your health care professional or pharmacist for more information. Do not breast-feed an infant while taking this medicine or for 1 year after the last dose. °What side effects may I notice from receiving this medication? °Side effects that you should report to your doctor or health care professional as soon as possible: °allergic reactions like skin rash, itching or hives, swelling of the face, lips, or tongue °feeling faint or lightheaded, falls °pain, tingling, numbness, or weakness in the legs °signs and symptoms of infection like fever or chills; cough; flu-like symptoms; sore throat °vaginal bleeding °Side effects that usually do not require medical attention (report to your doctor or health care professional if they continue or are bothersome): °aches, pains °constipation °diarrhea °headache °hot flashes °nausea, vomiting °pain at site where injected °stomach pain °This list may not describe all possible side effects. Call your doctor for medical advice about side effects. You may report side effects to FDA at 1-800-FDA-1088. °Where should I keep my medication? °This drug is given in a hospital or clinic and will not be stored at home. °NOTE: This sheet is a summary. It may not cover all possible information. If you have   questions about this medicine, talk to your doctor, pharmacist, or health care provider. °© 2022 [employer]vier/Gold Standard (2018-01-11 00:00:00) ° °

## 2021-09-16 NOTE — Progress Notes (Signed)
Hematology and Oncology Follow Up Visit  Annette James [id number] [birth year] 60 y.o. 09/16/2021   Principle Diagnosis:  Metastatic breast cancer-bone only metastasis-ER positive/PR positive/HER2 LOW ///  PIK3CA (+)   Current Therapy:        Faslodex 500 mg IM monthly-start on 11/06/2020 Ribociclib 600 mg p.o. daily (21 days on/7 days off)-start on 11/06/2020 XRT to the RIGHT hip  --completed on 07/24/2021 Zometa 4 mg IV q. 3 months -- next dose on 11/2021   Interim History:  Annette James is here today for follow-up.  She looks quite good.  She feels good.  She had a wonderful Thanksgiving.  She did take a granddaughter to see the [employer] show of [place].  That a wonderful time..  She did have a PET scan done.  This was done a few days ago.  The PET scan seem to show some increased activity so her bones.  She has had no pain.  It is hard to say with is really signifies.  Her last CA 27.29 was 31.  I think we should consider some type of radioisotope therapy for her.  I think this would be reasonable.  I think this would be effective.  I will have her speak with Dr. Sondra Come of Radiation Oncology and see what he has to say.  She has had no problems with cough or shortness of breath.  There is been no change in bowel or bladder habits.  She has had no leg swelling.  There is been no rashes.  She has had no fever.  There is been no weight loss or weight gain.  She nice Christmas with her family.  Currently, her performance status is ECOG 0.     Medications:  Allergies as of 09/16/2021   No Known Allergies      Medication List        Accurate as of September 16, 2021  8:35 AM. If you have any questions, ask your nurse or doctor.          ascorbic acid 500 MG tablet Commonly known as: VITAMIN C Take 500 mg by mouth daily.   cyclobenzaprine 5 MG tablet Commonly known as: FLEXERIL TAKE 1 TABLET BY MOUTH THREE TIMES A DAY AS NEEDED FOR MUSCLE SPASMS    fentaNYL 50 MCG/HR Commonly known as: [place] 1 patch onto the skin every other day.   Ibrance 125 MG tablet Generic drug: palbociclib TAKE 1 TABLET BY MOUTH ONCE DAILY WITH OR WITHOUT FOOD  FOR 21 DAYS, FOLLOWED BY 7  DAYS OFF, REPEATED EVERY 28 DAYS   ondansetron 8 MG tablet Commonly known as: ZOFRAN TAKE 1 TABLET (8 MG TOTAL) BY MOUTH EVERY 8 (EIGHT) HOURS AS NEEDED FOR NAUSEA OR VOMITING.   One-A-Day Womens 50 Plus Tabs Take 1 tablet by mouth daily.   oxyCODONE 5 MG immediate release tablet Commonly known as: Oxy IR/ROXICODONE TAKE 1-2 TABLETS BY MOUTH EVERY 4 HOURS AS NEEDED        Allergies: No Known Allergies  Past Medical History, Surgical history, Social history, and Family History were reviewed and updated.  Review of Systems: Review of Systems  Constitutional: Negative.   HENT: Negative.    Eyes: Negative.   Respiratory: Negative.    Cardiovascular: Negative.   Gastrointestinal: Negative.   Genitourinary: Negative.   Musculoskeletal:  Positive for joint pain and neck pain.  Skin: Negative.   Neurological: Negative.   Endo/Heme/Allergies: Negative.   Psychiatric/Behavioral: Negative.  Physical Exam:  weight is 195 lb 12 oz (88.8 kg). Her oral temperature is 98.4 F (36.9 C). Her blood pressure is 154/92 (abnormal) and her pulse is 76. Her respiration is 18 and oxygen saturation is 99%.   Wt Readings from Last 3 Encounters:  09/16/21 195 lb 12 oz (88.8 kg)  08/25/21 196 lb 6 oz (89.1 kg)  08/18/21 196 lb (88.9 kg)    Physical Exam Vitals reviewed.  HENT:     Head: Normocephalic and atraumatic.  Eyes:     Pupils: Pupils are equal, round, and reactive to light.  Cardiovascular:     Rate and Rhythm: Normal rate and regular rhythm.     Heart sounds: Normal heart sounds.  Pulmonary:     Effort: Pulmonary effort is normal.     Breath sounds: Normal breath sounds.  Abdominal:     General: Bowel sounds are normal.     Palpations:  Abdomen is soft.  Musculoskeletal:        General: No tenderness or deformity. Normal range of motion.     Cervical back: Normal range of motion.  Lymphadenopathy:     Cervical: No cervical adenopathy.  Skin:    General: Skin is warm and dry.     Findings: No erythema or rash.  Neurological:     Mental Status: She is alert and oriented to person, place, and time.  Psychiatric:        Behavior: Behavior normal.        Thought Content: Thought content normal.        Judgment: Judgment normal.     Lab Results  Component Value Date   WBC 2.5 (L) 09/16/2021   HGB 12.7 09/16/2021   HCT 36.1 09/16/2021   MCV 100.8 (H) 09/16/2021   PLT 145 (L) 09/16/2021   Lab Results  Component Value Date   FERRITIN 512 (H) 12/04/2020   IRON 96 12/04/2020   TIBC 244 12/04/2020   UIBC 147 12/04/2020   IRONPCTSAT 40 12/04/2020   Lab Results  Component Value Date   RBC 3.58 (L) 09/16/2021   Lab Results  Component Value Date   KAPLAMBRATIO 4.99 10/20/2020   Lab Results  Component Value Date   IGGSERUM 714 10/19/2020   IGA 153 10/19/2020   IGMSERUM 76 10/19/2020   Lab Results  Component Value Date   TOTALPROTELP 6.1 10/19/2020     Chemistry      Component Value Date/Time   NA 142 09/16/2021 0747   K 4.2 09/16/2021 0747   CL 105 09/16/2021 0747   CO2 28 09/16/2021 0747   BUN 13 09/16/2021 0747   CREATININE 0.74 09/16/2021 0747      Component Value Date/Time   CALCIUM 9.8 09/16/2021 0747   ALKPHOS 68 09/16/2021 0747   AST 19 09/16/2021 0747   ALT 9 09/16/2021 0747   BILITOT 0.7 09/16/2021 0747       Impression and Plan: Annette James is a very pleasant 60 yo caucasian female with metastatic breast cancer confined at this time to her bones.  She was diagnosed back in December 2021.  I will have to see if Radiation Oncology can help [place] out with any type of therapy for the areas of activity.  She has had past radiation to the right hip.  This was back in October.  I do still  think we can keep her on the Faslodex and Ibrance.  I just would hate to have to make a change in  her protocol right now.  I think that if we did have to make a change in protocol, I would consider her for [place].  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  We will try to get her through the Christmas holiday.  I would then like to see her back in January.   Volanda Napoleon, MD 12/6/20228:35 AM

## 2021-09-17 LAB — CANCER ANTIGEN 27.29: CA 27.29: 33.7 U/mL (ref 0.0–38.6)

## 2021-09-18 ENCOUNTER — Other Ambulatory Visit: Payer: BC Managed Care – PPO

## 2021-10-02 ENCOUNTER — Other Ambulatory Visit: Payer: Self-pay | Admitting: Hematology & Oncology

## 2021-10-02 DIAGNOSIS — C7951 Secondary malignant neoplasm of bone: Secondary | ICD-10-CM

## 2021-10-02 DIAGNOSIS — C50919 Malignant neoplasm of unspecified site of unspecified female breast: Secondary | ICD-10-CM

## 2021-10-02 MED ORDER — OXYCODONE HCL 5 MG PO TABS: ORAL_TABLET | ORAL | 0 refills | Status: DC | PRN

## 2021-10-02 MED ORDER — FENTANYL 50 MCG/HR TD PT72: 1.0000 | MEDICATED_PATCH | TRANSDERMAL | 0 refills | Status: DC

## 2021-10-02 NOTE — Telephone Encounter (Signed)
Refill request for Fentanyl patch and Oxycodone. Last refilled 09/08/21. Last office visit 09/16/21.

## 2021-10-07 ENCOUNTER — Other Ambulatory Visit: Payer: Self-pay | Admitting: Hematology & Oncology

## 2021-10-07 DIAGNOSIS — C7951 Secondary malignant neoplasm of bone: Secondary | ICD-10-CM

## 2021-10-21 ENCOUNTER — Inpatient Hospital Stay: Payer: BC Managed Care – PPO | Attending: Hematology & Oncology

## 2021-10-21 ENCOUNTER — Inpatient Hospital Stay (HOSPITAL_BASED_OUTPATIENT_CLINIC_OR_DEPARTMENT_OTHER): Payer: BC Managed Care – PPO | Admitting: Hematology & Oncology

## 2021-10-21 ENCOUNTER — Encounter: Payer: Self-pay | Admitting: Hematology & Oncology

## 2021-10-21 ENCOUNTER — Inpatient Hospital Stay: Payer: BC Managed Care – PPO

## 2021-10-21 ENCOUNTER — Other Ambulatory Visit: Payer: Self-pay

## 2021-10-21 ENCOUNTER — Encounter: Payer: Self-pay | Admitting: *Deleted

## 2021-10-21 VITALS — BP 152/94 | HR 88 | Temp 98.2°F | Resp 18 | Wt 194.0 lb

## 2021-10-21 DIAGNOSIS — Z5111 Encounter for antineoplastic chemotherapy: Secondary | ICD-10-CM | POA: Insufficient documentation

## 2021-10-21 DIAGNOSIS — C50919 Malignant neoplasm of unspecified site of unspecified female breast: Secondary | ICD-10-CM

## 2021-10-21 DIAGNOSIS — C7951 Secondary malignant neoplasm of bone: Secondary | ICD-10-CM

## 2021-10-21 DIAGNOSIS — Z170 Estrogen receptor positive status [ER+]: Secondary | ICD-10-CM | POA: Diagnosis not present

## 2021-10-21 LAB — CMP (CANCER CENTER ONLY)
ALT: 11 U/L (ref 0–44)
AST: 20 U/L (ref 15–41)
Albumin: 4.3 g/dL (ref 3.5–5.0)
Alkaline Phosphatase: 99 U/L (ref 38–126)
Anion gap: 7 (ref 5–15)
BUN: 11 mg/dL (ref 6–20)
CO2: 30 mmol/L (ref 22–32)
Calcium: 10.2 mg/dL (ref 8.9–10.3)
Chloride: 101 mmol/L (ref 98–111)
Creatinine: 0.76 mg/dL (ref 0.44–1.00)
GFR, Estimated: >60 mL/min (ref >60)
Glucose, Bld: 111 mg/dL — ABNORMAL HIGH (ref 70–99)
Potassium: 4.2 mmol/L (ref 3.5–5.1)
Sodium: 138 mmol/L (ref 135–145)
Total Bilirubin: 0.5 mg/dL (ref 0.3–1.2)
Total Protein: 6.9 g/dL (ref 6.5–8.1)

## 2021-10-21 LAB — CBC WITH DIFFERENTIAL (CANCER CENTER ONLY)
Abs Immature Granulocytes: 0.02 10*3/uL (ref 0.00–0.07)
Basophils Absolute: 0.1 10*3/uL (ref 0.0–0.1)
Basophils Relative: 2 %
Eosinophils Absolute: 0.1 10*3/uL (ref 0.0–0.5)
Eosinophils Relative: 3 %
HCT: 36.4 % (ref 36.0–46.0)
Hemoglobin: 12.7 g/dL (ref 12.0–15.0)
Immature Granulocytes: 1 %
Lymphocytes Relative: 13 %
Lymphs Abs: 0.5 10*3/uL — ABNORMAL LOW (ref 0.7–4.0)
MCH: 35.8 pg — ABNORMAL HIGH (ref 26.0–34.0)
MCHC: 34.9 g/dL (ref 30.0–36.0)
MCV: 102.5 fL — ABNORMAL HIGH (ref 80.0–100.0)
Monocytes Absolute: 0.6 10*3/uL (ref 0.1–1.0)
Monocytes Relative: 16 %
Neutro Abs: 2.3 10*3/uL (ref 1.7–7.7)
Neutrophils Relative %: 65 %
Platelet Count: 148 10*3/uL — ABNORMAL LOW (ref 150–400)
RBC: 3.55 MIL/uL — ABNORMAL LOW (ref 3.87–5.11)
RDW: 12.9 % (ref 11.5–15.5)
WBC Count: 3.6 10*3/uL — ABNORMAL LOW (ref 4.0–10.5)
nRBC: 0 % (ref 0.0–0.2)

## 2021-10-21 MED ORDER — FULVESTRANT 250 MG/5ML IM SOSY
500.0000 mg | PREFILLED_SYRINGE | Freq: Once | INTRAMUSCULAR | Status: AC
  Administered 2021-10-21: 500 mg via INTRAMUSCULAR
  Filled 2021-10-21: qty 10

## 2021-10-21 NOTE — Progress Notes (Signed)
Dr Marin Olp spoke to Dr Sondra Come this morning regarding possible targeted radiation therapy. He will see her 10/30/21.   Oncology Nurse Navigator Documentation  Oncology Nurse Navigator Flowsheets 10/21/2021  Abnormal Finding Date -  Confirmed Diagnosis Date -  Diagnosis Status -  Planned Course of Treatment -  Phase of Treatment -  Chemotherapy Pending- Reason: -  Chemotherapy Actual Start Date: -  Radiation Actual Start Date: -  Radiation Expected End Date: -  Navigator Follow Up Date: 11/20/2021  Navigator Follow Up Reason: Follow-up Appointment;Chemotherapy  Navigator Restaurant manager, fast food Encounter Type Appt/Treatment Plan Review  Telephone -  Treatment Initiated Date -  Patient Visit Type MedOnc  Treatment Phase Active Tx  Barriers/Navigation Needs Coordination of Care;Education  Education -  Interventions None Required  Acuity Level 2-Minimal Needs (1-2 Barriers Identified)  Referrals -  Coordination of Care -  Education Method -  Support Groups/Services Friends and Family  Time Spent with Patient 15

## 2021-10-21 NOTE — Patient Instructions (Signed)
Fulvestrant injection °What is this medication? °FULVESTRANT (ful VES trant) blocks the effects of estrogen. It is used to treat breast cancer. °This medicine may be used for other purposes; ask your health care provider or pharmacist if you have questions. °COMMON BRAND NAME(S): FASLODEX °What should I tell my care team before I take this medication? °They need to know if you have any of these conditions: °bleeding disorders °liver disease °low blood counts, like low white cell, platelet, or red cell counts °an unusual or allergic reaction to fulvestrant, other medicines, foods, dyes, or preservatives °pregnant or trying to get pregnant °breast-feeding °How should I use this medication? °This medicine is for injection into a muscle. It is usually given by a health care professional in a hospital or clinic setting. °Talk to your pediatrician regarding the use of this medicine in children. Special care may be needed. °Overdosage: If you think you have taken too much of this medicine contact a poison control center or emergency room at once. °NOTE: This medicine is only for you. Do not share this medicine with others. °What if I miss a dose? °It is important not to miss your dose. Call your doctor or health care professional if you are unable to keep an appointment. °What may interact with this medication? °medicines that treat or prevent blood clots like warfarin, enoxaparin, dalteparin, apixaban, dabigatran, and rivaroxaban °This list may not describe all possible interactions. Give your health care provider a list of all the medicines, herbs, non-prescription drugs, or dietary supplements you use. Also tell them if you smoke, drink alcohol, or use illegal drugs. Some items may interact with your medicine. °What should I watch for while using this medication? °Your condition will be monitored carefully while you are receiving this medicine. You will need important blood work done while you are taking this  medicine. °Do not become pregnant while taking this medicine or for at least 1 year after stopping it. Women of child-bearing potential will need to have a negative pregnancy test before starting this medicine. Women should inform their doctor if they wish to become pregnant or think they might be pregnant. There is a potential for serious side effects to an unborn child. Men should inform their doctors if they wish to father a child. This medicine may lower sperm counts. Talk to your health care professional or pharmacist for more information. Do not breast-feed an infant while taking this medicine or for 1 year after the last dose. °What side effects may I notice from receiving this medication? °Side effects that you should report to your doctor or health care professional as soon as possible: °allergic reactions like skin rash, itching or hives, swelling of the face, lips, or tongue °feeling faint or lightheaded, falls °pain, tingling, numbness, or weakness in the legs °signs and symptoms of infection like fever or chills; cough; flu-like symptoms; sore throat °vaginal bleeding °Side effects that usually do not require medical attention (report to your doctor or health care professional if they continue or are bothersome): °aches, pains °constipation °diarrhea °headache °hot flashes °nausea, vomiting °pain at site where injected °stomach pain °This list may not describe all possible side effects. Call your doctor for medical advice about side effects. You may report side effects to FDA at 1-800-FDA-1088. °Where should I keep my medication? °This drug is given in a hospital or clinic and will not be stored at home. °NOTE: This sheet is a summary. It may not cover all possible information. If you have   questions about this medicine, talk to your doctor, pharmacist, or health care provider. °© 2022 [employer]vier/Gold Standard (2018-01-11 00:00:00) ° °

## 2021-10-21 NOTE — Progress Notes (Addendum)
Hematology and Oncology Follow Up Visit  Annette James [id number] 08-20-1961 61 y.o. 10/21/2021   Principle Diagnosis:  Metastatic breast cancer-bone only metastasis-ER positive/PR positive/HER2 LOW ///  PIK3CA (+)   Current Therapy:        Faslodex 500 mg IM monthly-start on 11/06/2020 Ibrance 125 MG PO Q DAY (21 days on/7 days off) XRT to the RIGHT hip  --completed on 07/24/2021 Zometa 4 mg IV q. 3 months -- next dose on 11/2021   Interim History:  Annette James is here today for follow-up.  She is doing pretty well.  She just got back from [place], [name]  She was down at a meeting.  She had a good time down at the meeting.  Her last CA 27.29 was slowly trending upward.  There was 34.  The trend has been slowly upward.  We did her last PET scan back in December, she did have some areas that may have been new.  We will have to see if Radiation Oncology will be able to help [place] out.  I will think she is symptomatic with these areas but I still think that they would probably benefit from radiation therapy.  Her appetite has been good.  She has had no nausea or vomiting.  There is been no change in bowel bladder habits.  She has had no leg swelling.  There has been no bleeding.  Overall, I would say performance status is ECOG 1.     Medications:  Allergies as of 10/21/2021   No Known Allergies      Medication List        Accurate as of October 21, 2021  8:04 AM. If you have any questions, ask your nurse or doctor.          ascorbic acid 500 MG tablet Commonly known as: VITAMIN C Take 500 mg by mouth daily.   cyclobenzaprine 5 MG tablet Commonly known as: FLEXERIL TAKE 1 TABLET BY MOUTH THREE TIMES A DAY AS NEEDED FOR MUSCLE SPASMS   fentaNYL 50 MCG/HR Commonly known as: [place] 1 patch onto the skin every other day.   Ibrance 125 MG tablet Generic drug: palbociclib TAKE 1 TABLET BY MOUTH ONCE DAILY WITH OR WITHOUT FOOD  FOR 21 DAYS, FOLLOWED  BY 7  DAYS OFF, REPEATED EVERY 28 DAYS   ondansetron 8 MG tablet Commonly known as: ZOFRAN TAKE 1 TABLET (8 MG TOTAL) BY MOUTH EVERY 8 (EIGHT) HOURS AS NEEDED FOR NAUSEA OR VOMITING.   One-A-Day Womens 50 Plus Tabs Take 1 tablet by mouth daily.   oxyCODONE 5 MG immediate release tablet Commonly known as: Oxy IR/ROXICODONE TAKE 1-2 TABLETS BY MOUTH EVERY 4 HOURS AS NEEDED        Allergies: No Known Allergies  Past Medical History, Surgical history, Social history, and Family History were reviewed and updated.  Review of Systems: Review of Systems  Constitutional: Negative.   HENT: Negative.    Eyes: Negative.   Respiratory: Negative.    Cardiovascular: Negative.   Gastrointestinal: Negative.   Genitourinary: Negative.   Musculoskeletal:  Positive for joint pain and neck pain.  Skin: Negative.   Neurological: Negative.   Endo/Heme/Allergies: Negative.   Psychiatric/Behavioral: Negative.       Physical Exam:  weight is 194 lb (88 kg). Her oral temperature is 98.2 F (36.8 C). Her blood pressure is 152/94 (abnormal) and her pulse is 88. Her respiration is 18 and oxygen saturation is 98%.   Wt Readings from  Last 3 Encounters:  10/21/21 194 lb (88 kg)  09/16/21 195 lb 12 oz (88.8 kg)  08/25/21 196 lb 6 oz (89.1 kg)    Physical Exam Vitals reviewed.  HENT:     Head: Normocephalic and atraumatic.  Eyes:     Pupils: Pupils are equal, round, and reactive to light.  Cardiovascular:     Rate and Rhythm: Normal rate and regular rhythm.     Heart sounds: Normal heart sounds.  Pulmonary:     Effort: Pulmonary effort is normal.     Breath sounds: Normal breath sounds.  Abdominal:     General: Bowel sounds are normal.     Palpations: Abdomen is soft.  Musculoskeletal:        General: No tenderness or deformity. Normal range of motion.     Cervical back: Normal range of motion.  Lymphadenopathy:     Cervical: No cervical adenopathy.  Skin:    General: Skin is warm  and dry.     Findings: No erythema or rash.  Neurological:     Mental Status: She is alert and oriented to person, place, and time.  Psychiatric:        Behavior: Behavior normal.        Thought Content: Thought content normal.        Judgment: Judgment normal.     Lab Results  Component Value Date   WBC 3.6 (L) 10/21/2021   HGB 12.7 10/21/2021   HCT 36.4 10/21/2021   MCV 102.5 (H) 10/21/2021   PLT 148 (L) 10/21/2021   Lab Results  Component Value Date   FERRITIN 512 (H) 12/04/2020   IRON 96 12/04/2020   TIBC 244 12/04/2020   UIBC 147 12/04/2020   IRONPCTSAT 40 12/04/2020   Lab Results  Component Value Date   RBC 3.55 (L) 10/21/2021   Lab Results  Component Value Date   KAPLAMBRATIO 4.99 10/20/2020   Lab Results  Component Value Date   IGGSERUM 714 10/19/2020   IGA 153 10/19/2020   IGMSERUM 76 10/19/2020   Lab Results  Component Value Date   TOTALPROTELP 6.1 10/19/2020     Chemistry      Component Value Date/Time   NA 142 09/16/2021 0747   K 4.2 09/16/2021 0747   CL 105 09/16/2021 0747   CO2 28 09/16/2021 0747   BUN 13 09/16/2021 0747   CREATININE 0.74 09/16/2021 0747      Component Value Date/Time   CALCIUM 9.8 09/16/2021 0747   ALKPHOS 68 09/16/2021 0747   AST 19 09/16/2021 0747   ALT 9 09/16/2021 0747   BILITOT 0.7 09/16/2021 0747       Impression and Plan: Annette James is a very pleasant 61 yo caucasian female with metastatic breast cancer confined at this time to her bones.  She was diagnosed back in December 2021.  Again, I really have to see if Radiation Oncology can help [place] out.  I would like to believe that they can do some radiation therapy to these areas that might be a little more active on the PET scan.  We will see what this [place] CA 27.29 is.  She will get her Faslodex today.  She is not due for the Zometa until February.  I still think that if we had to make a change in her protocol, we can utilize [place].  We will plan to  get her back in 4 more weeks.  Annette Napoleon, MD 1/10/20238:04 AM

## 2021-10-22 ENCOUNTER — Encounter: Payer: Self-pay | Admitting: Hematology & Oncology

## 2021-10-22 ENCOUNTER — Encounter: Payer: Self-pay | Admitting: Radiation Oncology

## 2021-10-22 ENCOUNTER — Telehealth: Payer: Self-pay

## 2021-10-22 LAB — CANCER ANTIGEN 27.29: CA 27.29: 43.3 U/mL — ABNORMAL HIGH (ref 0.0–38.6)

## 2021-10-22 NOTE — Telephone Encounter (Signed)
-----   Message from Volanda Napoleon, MD sent at 10/22/2021  2:04 PM EST ----- Call - the tumor level is trending up slowly!!  Let's see if XRT can help this!!  Laurey Arrow

## 2021-10-22 NOTE — Telephone Encounter (Signed)
Called and informed patient of lab results, patient verbalized understanding and denies any questions or concerns at this time.   

## 2021-10-22 NOTE — Progress Notes (Signed)
Histology and Location of Primary Cancer: right Breast   Sites of Visceral and Bony Metastatic Disease: left ribs, spine   Location(s) of Symptomatic Metastases: bilateral hip pain   Past/Anticipated chemotherapy by medical oncology, if any:  Dr. Marin Olp 10/21/2021 We did her last PET scan back in December, she did have some areas that may have been new.  We will have to see if Radiation Oncology will be able to help [place] out.  I will think she is symptomatic with these areas but I still think that they would probably benefit from radiation therapy.    PET 09/12/2021:  Sternal uptake in the lower sternal body with a maximum SUV of 7.4 as compared to a maximum SUV of 5.2 (image 90/4)   Multi level spinal involvement, all visualized bones with sclerotic and heterogeneous appearance. Uptake at the T5 level more pronounced than on previous imaging visually. T6 level with a maximum SUV on today's study at 4.8 as compared to 4.4.   T11 with a maximum SUV today of 5.2 as compared to 3.1. Multiple areas of compression of superior endplates throughout the thoracic spine with similar appearance.   L4 with a maximum SUV on today's study of 5.2, previously with a maximum SUV in this area of 3.3.   RIGHT posterior iliac with a maximum SUV of 3.6 (image 150/4) previously 5.1.   Adjacent RIGHT sacrum with a maximum SUV of 3.1, previously 5.5.   Marked increased metabolic activity about the RIGHT femur, proximal RIGHT femur at the level of the lesser trochanter (image 185/4) maximum SUV 7.8 as compared to 4.5   RIGHT inferior pubic ramus near the ischium with a maximum SUV of 9.1 ((image 26/4) previously 6.7 scattered additional areas that display some increase in metabolic activity in the spine.   LEFT sided rib lesion involving the LEFT second rib with a maximum SUV of 4.8, previously 3.4. LEFT humeral lesion (image 43/4) maximum SUV of 5.5 previously little if any increased metabolic activity  in this location   Focal uptake in the RIGHT mandibular condyle. No surrounding stranding (image 14/4) heterogeneous appearance of the bilateral mandibular condyles. More pronounced lucency on the RIGHT with a maximum SUV of 5.3 without appreciable uptake on the previous study.   Scattered rib lesions with visibly increased metabolic activity following other lesions described above.   Signs of increased metabolic activity in the lower cervical and upper thoracic spine visibly increased from previous evaluations also similar to other lesions described.   Biopsy :  10/25/2020    Pain on a scale of 7-86 is: Not applicable    If Spine Met(s), symptoms, if any, include: Bowel/Bladder retention or incontinence (please describe): none Numbness or weakness in extremities (please describe): none Current Decadron regimen, if applicable: none  Ambulatory status? Walker? Wheelchair?: Ambulatory  SAFETY ISSUES: Prior radiation? 2/1-2/18/2022, Left Hip/Pelvis with Dr. Sondra Come, 07/07/2021-07/24/2021, right hip/pelvis with Dr Sondra Come. Pacemaker/ICD? no Possible current pregnancy? No, postmenopausal Is the patient on methotrexate? no  Current Complaints / other details:  bilateral hip pain    Vitals:   10/30/21 1259  BP: (!) 156/89  Pulse: 76  Resp: 18  Temp: (!) 96.3 F (35.7 C)  TempSrc: Temporal  SpO2: 98%  Weight: 191 lb 6 oz (86.8 kg)  Height: '5\' 8"'  (1.727 m)

## 2021-10-29 NOTE — Progress Notes (Signed)
Radiation Oncology         [phone] ________________________________  Outpatient Re-Consultation  Name: Annette James MRN: [id number]  Date: 10/30/2021  DOB: 07-Dec-1960  CW:CBJSE, Arvid Right, MD  Volanda Napoleon, MD   REFERRING PHYSICIAN: Volanda Napoleon, MD  DIAGNOSIS: The encounter diagnosis was Breast cancer metastasized to bone, unspecified laterality ([place]).  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 female who is seen as a courtesy of Dr. Marin Olp for an opinion concerning further radiation therapy as part of management for her diagnosed bony metastatic breast cancer. She was last seen here for follow up on 08/25/21.   Shortly after our last visit, a PET scan on 09/12/21 demonstrated a balanced increase in metabolic activity throughout visible lesions, and new areas of increased metabolic activity in the spine and ribs. In particular, an area of increased metabolic activity in the right mandibular condyle was appreciated. In addition, a left adnexal cystic lesion was seen as slightly enlarged compared to prior imaging, measuring >4 cm (did not change in appearance). Otherwise, multifocal bony metastases with areas of destructive change appeared unchanged in the interval, and no evidence of solid organ, pulmonary, or nodal metastases were appreciated.   Accordingly, the patient met with Dr. Marin Olp on 09/16/21 for further evaluation. During this visit, the patient reported feeling well and was noted to appear in good spirits. Per Dr. Marin Olp, findings on PET were hard to signify given that the  patient has no pain. Ultimately, Dr. Marin Olp recommended consideration of some form of radioisotope therapy.   During follow up with Dr. Marin Olp on 10/21/20, the patient again denied any complaints, and was noted to appear healthy overall. (Of note: the patient was given her most recent Faslodex injection during this visit).  Given her recent PET scan images of some progression she is now seen in radiation oncology for consideration for additional treatment.  She has noticed some mild pain along the right lower pelvis area but nothing significant.  She continues on Duragesic patch 50 mcg/h and has oxycodone for breakthrough pain.  She denies any significant issues with sleeping at night.  She denies worsening of her pain in the right pelvis upon standing.  She denies any numbness or weakness in her lower extremities.   PAST MEDICAL HISTORY:  Past Medical History:  Diagnosis Date   Breast cancer metastasized to bone, unspecified laterality ([place]) 11/01/2020   radiation txt to spine 11/13/2020-11/29/2020  Dr Sondra Come   Chicken pox    Class 1 obesity 12/16/2020   Colitis 12/2020   Goals of care, counseling/discussion 10/19/2020   History of radiation therapy 11/13/2020-11/29/2020   left hip   Dr Gery Pray   History of radiation therapy    right hip 07/07/2021-07/24/2021  Dr Gery Pray   Metastatic cancer to spine [facility]) 10/19/2020    PAST SURGICAL HISTORY: Past Surgical History:  Procedure Laterality Date   APPENDECTOMY     CESAREAN SECTION     x 4   CHOLECYSTECTOMY      FAMILY HISTORY:  Family History  Problem Relation Age of Onset  Breast cancer Mother    Hypertension Mother    Colon cancer Father    Hypertension Father    Alcoholism Brother    Alcoholism Brother     SOCIAL HISTORY:  Social History   Tobacco Use   Smoking status: Never   Smokeless tobacco: Never  Vaping Use   Vaping Use: Never used  Substance Use Topics   Alcohol use: Not Currently   Drug use: Not  Currently    ALLERGIES: No Known Allergies  MEDICATIONS:  Current Outpatient Medications  Medication Sig Dispense Refill   ascorbic acid (VITAMIN C) 500 MG tablet Take 500 mg by mouth daily.     cyclobenzaprine (FLEXERIL) 5 MG tablet TAKE 1 TABLET BY MOUTH THREE TIMES A DAY AS NEEDED FOR MUSCLE SPASMS 30 tablet 2   fentaNYL (DURAGESIC) 50 MCG/HR Place 1 patch onto the skin every other day. 15 patch 0   IBRANCE 125 MG tablet TAKE 1 TABLET BY MOUTH ONCE DAILY WITH OR WITHOUT FOOD  FOR 21 DAYS, FOLLOWED BY 7  DAYS OFF, REPEATED EVERY 28 DAYS 21 tablet 4   Multiple Vitamins-Minerals (ONE-A-DAY WOMENS 50 PLUS) TABS Take 1 tablet by mouth daily.     ondansetron (ZOFRAN) 8 MG tablet TAKE 1 TABLET (8 MG TOTAL) BY MOUTH EVERY 8 (EIGHT) HOURS AS NEEDED FOR NAUSEA OR VOMITING. 30 tablet 3   oxyCODONE (OXY IR/ROXICODONE) 5 MG immediate release tablet TAKE 1-2 TABLETS BY MOUTH EVERY 4 HOURS AS NEEDED 60 tablet 0   No current facility-administered medications for this encounter.    REVIEW OF SYSTEMS:  A 10+ POINT REVIEW OF SYSTEMS WAS OBTAINED including neurology, dermatology, psychiatry, cardiac, respiratory, lymph, extremities, GI, GU, musculoskeletal, constitutional, reproductive, HEENT.  Review of systems as above.Marland Kitchen   PHYSICAL EXAM:  height is '5\' 8"'  (1.727 m) and weight is 191 lb 6 oz (86.8 kg). Her temporal temperature is 96.3 F (35.7 C) (abnormal). Her blood pressure is 156/89 (abnormal) and her pulse is 76. Her respiration is 18 and oxygen saturation is 98%.   General: Alert and oriented, in no acute distress HEENT: Head is normocephalic. Extraocular movements are intact.  Neck: Neck is supple, no palpable cervical or supraclavicular lymphadenopathy. Heart: Regular in rate and rhythm with no murmurs, rubs, or gallops. Chest: Clear to auscultation bilaterally, with no rhonchi, wheezes, or rales. Abdomen: Soft, nontender, nondistended, with no rigidity or guarding. Extremities: No cyanosis or  edema. Lymphatics: see Neck Exam Skin: No concerning lesions. Musculoskeletal: symmetric strength and muscle tone throughout. Neurologic: Cranial nerves II through XII are grossly intact. No obvious focalities. Speech is fluent. Coordination is intact. Psychiatric: Judgment and insight are intact. Affect is appropriate. Palpation along the pelvis region reveals no areas of point tenderness or soft tissue mass.  Lower motor strength is 5 out of 5 in the proximal and distal muscle groups.  ECOG = 1  0 - Asymptomatic (Fully active, able to carry on all predisease activities without restriction)  1 - Symptomatic but completely ambulatory (Restricted in physically strenuous activity but ambulatory and able to carry out work of a light or sedentary nature. For example, light housework, office work)  2 - Symptomatic, <50% in bed during the day (Ambulatory and capable of all self care but unable to carry out any work activities. Up and about more than 50% of waking hours)  3 - Symptomatic, >50% in bed, but not bedbound (Capable of only limited self-care, confined to bed or chair 50% or more of waking  hours)  4 - Bedbound (Completely disabled. Cannot carry on any self-care. Totally confined to bed or chair)  5 - Death   Eustace Pen MM, Creech RH, Tormey DC, et al. [phone]). "Toxicity and response criteria of the [facility]". [place] Oncol. 5 (6): 649-55  LABORATORY DATA:  Lab Results  Component Value Date   WBC 3.6 (L) 10/21/2021   HGB 12.7 10/21/2021   HCT 36.4 10/21/2021   MCV 102.5 (H) 10/21/2021   PLT 148 (L) 10/21/2021   NEUTROABS 2.3 10/21/2021   Lab Results  Component Value Date   NA 138 10/21/2021   K 4.2 10/21/2021   CL 101 10/21/2021   CO2 30 10/21/2021   GLUCOSE 111 (H) 10/21/2021   CREATININE 0.76 10/21/2021   CALCIUM 10.2 10/21/2021      RADIOGRAPHY: No results found.    IMPRESSION: Stage IV Right Breast UOQ Invasive Ductal Carcinoma with DCIS  and bony metastases, ER+ / PR+ / Her2-, Grade 2  I have carefully reviewed the patient's recent bone scan as it relates to her previous treatment.  If any area seems to be bothering her it is along the right lower pelvis region.  Recent PET scan did show marked increased metabolic activity along the right femur in particular the proximal right femur at the level of the lesser trochanter.  In addition there was significant uptake along the right inferior pubic ramus with an SUV of 9.1.  Given these PET scan findings I would recommend a short course of palliative radiation therapy directed at the right lower pelvis right proximal femur and right inferior pubic rami.  This area has not been treated with any of her previous treatments.  I discussed the course of treatment side effects and potential toxicities of radiation therapy for treatment to this area with the patient.  She appears to understand and wishes to proceed with planned course of treatment.  The patient was encouraged to ask questions that I answered to the best of my ability.  A patient consent form was discussed and signed.  We retained a copy for our records.  The patient would like to proceed with radiation and will be scheduled for CT simulation.  PLAN: Return for CT simulation on January 23 at 8 AM.  Anticipate radiation therapy starting approximately a week later with 10-14 treatments anticipated.   35 minutes of total time was spent for this patient encounter, including preparation, face-to-face counseling with the patient and coordination of care, physical exam, and documentation of the encounter.   ------------------------------------------------  Blair Promise, PhD, MD  This document serves as a record of services personally performed by Gery Pray, MD. It was created on his behalf by Roney Mans, a trained medical scribe. The creation of this record is based on the scribe's personal observations and the provider's  statements to them. This document has been checked and approved by the attending provider.

## 2021-10-30 ENCOUNTER — Ambulatory Visit
Admission: RE | Admit: 2021-10-30 | Discharge: 2021-10-30 | Disposition: A | Discharge status: Home / Self Care | Payer: BC Managed Care – PPO | Source: Ambulatory Visit | Attending: Radiation Oncology | Admitting: Radiation Oncology

## 2021-10-30 ENCOUNTER — Other Ambulatory Visit: Payer: Self-pay

## 2021-10-30 ENCOUNTER — Encounter: Payer: Self-pay | Admitting: Radiation Oncology

## 2021-10-30 VITALS — BP 156/89 | HR 76 | Temp 96.3°F | Resp 18 | Ht 68.0 in | Wt 191.4 lb

## 2021-10-30 DIAGNOSIS — Z170 Estrogen receptor positive status [ER+]: Secondary | ICD-10-CM | POA: Insufficient documentation

## 2021-10-30 DIAGNOSIS — Z79899 Other long term (current) drug therapy: Secondary | ICD-10-CM | POA: Diagnosis not present

## 2021-10-30 DIAGNOSIS — E669 Obesity, unspecified: Secondary | ICD-10-CM | POA: Insufficient documentation

## 2021-10-30 DIAGNOSIS — C50411 Malignant neoplasm of upper-outer quadrant of right female breast: Secondary | ICD-10-CM | POA: Diagnosis not present

## 2021-10-30 DIAGNOSIS — C7951 Secondary malignant neoplasm of bone: Secondary | ICD-10-CM | POA: Diagnosis not present

## 2021-10-30 DIAGNOSIS — Z923 Personal history of irradiation: Secondary | ICD-10-CM | POA: Diagnosis not present

## 2021-10-30 DIAGNOSIS — C50919 Malignant neoplasm of unspecified site of unspecified female breast: Secondary | ICD-10-CM

## 2021-10-30 DIAGNOSIS — Z803 Family history of malignant neoplasm of breast: Secondary | ICD-10-CM | POA: Insufficient documentation

## 2021-10-30 NOTE — Progress Notes (Signed)
See MD note for nursing evaluation. °

## 2021-11-03 ENCOUNTER — Other Ambulatory Visit: Payer: Self-pay

## 2021-11-03 ENCOUNTER — Ambulatory Visit
Admission: RE | Admit: 2021-11-03 | Discharge: 2021-11-03 | Disposition: A | Discharge status: Home / Self Care | Payer: BC Managed Care – PPO | Source: Ambulatory Visit | Attending: Radiation Oncology | Admitting: Radiation Oncology

## 2021-11-03 DIAGNOSIS — C7951 Secondary malignant neoplasm of bone: Secondary | ICD-10-CM

## 2021-11-03 DIAGNOSIS — Z170 Estrogen receptor positive status [ER+]: Secondary | ICD-10-CM | POA: Insufficient documentation

## 2021-11-03 DIAGNOSIS — Z510 Encounter for antineoplastic radiation therapy: Secondary | ICD-10-CM | POA: Insufficient documentation

## 2021-11-03 DIAGNOSIS — C50411 Malignant neoplasm of upper-outer quadrant of right female breast: Secondary | ICD-10-CM | POA: Diagnosis not present

## 2021-11-09 DIAGNOSIS — C50411 Malignant neoplasm of upper-outer quadrant of right female breast: Secondary | ICD-10-CM | POA: Diagnosis not present

## 2021-11-09 DIAGNOSIS — C7951 Secondary malignant neoplasm of bone: Secondary | ICD-10-CM | POA: Diagnosis not present

## 2021-11-09 DIAGNOSIS — Z510 Encounter for antineoplastic radiation therapy: Secondary | ICD-10-CM | POA: Diagnosis not present

## 2021-11-09 DIAGNOSIS — Z170 Estrogen receptor positive status [ER+]: Secondary | ICD-10-CM | POA: Diagnosis not present

## 2021-11-10 ENCOUNTER — Ambulatory Visit
Admission: RE | Admit: 2021-11-10 | Discharge: 2021-11-10 | Disposition: A | Discharge status: Home / Self Care | Payer: BC Managed Care – PPO | Source: Ambulatory Visit | Attending: Radiation Oncology | Admitting: Radiation Oncology

## 2021-11-10 ENCOUNTER — Other Ambulatory Visit: Payer: Self-pay

## 2021-11-10 DIAGNOSIS — C7951 Secondary malignant neoplasm of bone: Secondary | ICD-10-CM | POA: Diagnosis not present

## 2021-11-10 DIAGNOSIS — Z170 Estrogen receptor positive status [ER+]: Secondary | ICD-10-CM | POA: Diagnosis not present

## 2021-11-10 DIAGNOSIS — C50411 Malignant neoplasm of upper-outer quadrant of right female breast: Secondary | ICD-10-CM | POA: Diagnosis not present

## 2021-11-10 DIAGNOSIS — C50919 Malignant neoplasm of unspecified site of unspecified female breast: Secondary | ICD-10-CM

## 2021-11-10 DIAGNOSIS — Z510 Encounter for antineoplastic radiation therapy: Secondary | ICD-10-CM | POA: Diagnosis not present

## 2021-11-11 ENCOUNTER — Other Ambulatory Visit: Payer: Self-pay

## 2021-11-11 ENCOUNTER — Other Ambulatory Visit: Payer: Self-pay | Admitting: Hematology & Oncology

## 2021-11-11 ENCOUNTER — Ambulatory Visit
Admission: RE | Admit: 2021-11-11 | Discharge: 2021-11-11 | Disposition: A | Discharge status: Home / Self Care | Payer: BC Managed Care – PPO | Source: Ambulatory Visit | Attending: Radiation Oncology | Admitting: Radiation Oncology

## 2021-11-11 DIAGNOSIS — C7951 Secondary malignant neoplasm of bone: Secondary | ICD-10-CM

## 2021-11-11 DIAGNOSIS — Z170 Estrogen receptor positive status [ER+]: Secondary | ICD-10-CM | POA: Diagnosis not present

## 2021-11-11 DIAGNOSIS — C50411 Malignant neoplasm of upper-outer quadrant of right female breast: Secondary | ICD-10-CM | POA: Diagnosis not present

## 2021-11-11 DIAGNOSIS — C50919 Malignant neoplasm of unspecified site of unspecified female breast: Secondary | ICD-10-CM

## 2021-11-11 DIAGNOSIS — Z510 Encounter for antineoplastic radiation therapy: Secondary | ICD-10-CM | POA: Diagnosis not present

## 2021-11-11 MED ORDER — OXYCODONE HCL 5 MG PO TABS: ORAL_TABLET | ORAL | 0 refills | Status: DC | PRN

## 2021-11-11 MED ORDER — FENTANYL 50 MCG/HR TD PT72: 1.0000 | MEDICATED_PATCH | TRANSDERMAL | 0 refills | Status: DC

## 2021-11-12 ENCOUNTER — Ambulatory Visit
Admission: RE | Admit: 2021-11-12 | Discharge: 2021-11-12 | Disposition: A | Discharge status: Home / Self Care | Payer: BC Managed Care – PPO | Source: Ambulatory Visit | Attending: Radiation Oncology | Admitting: Radiation Oncology

## 2021-11-12 DIAGNOSIS — C50411 Malignant neoplasm of upper-outer quadrant of right female breast: Secondary | ICD-10-CM | POA: Diagnosis not present

## 2021-11-12 DIAGNOSIS — Z510 Encounter for antineoplastic radiation therapy: Secondary | ICD-10-CM | POA: Insufficient documentation

## 2021-11-12 DIAGNOSIS — Z170 Estrogen receptor positive status [ER+]: Secondary | ICD-10-CM | POA: Insufficient documentation

## 2021-11-12 DIAGNOSIS — C7951 Secondary malignant neoplasm of bone: Secondary | ICD-10-CM | POA: Diagnosis not present

## 2021-11-13 ENCOUNTER — Other Ambulatory Visit: Payer: Self-pay

## 2021-11-13 ENCOUNTER — Ambulatory Visit
Admission: RE | Admit: 2021-11-13 | Discharge: 2021-11-13 | Disposition: A | Discharge status: Home / Self Care | Payer: BC Managed Care – PPO | Source: Ambulatory Visit | Attending: Radiation Oncology | Admitting: Radiation Oncology

## 2021-11-13 DIAGNOSIS — Z170 Estrogen receptor positive status [ER+]: Secondary | ICD-10-CM | POA: Diagnosis not present

## 2021-11-13 DIAGNOSIS — C7951 Secondary malignant neoplasm of bone: Secondary | ICD-10-CM | POA: Diagnosis not present

## 2021-11-13 DIAGNOSIS — Z510 Encounter for antineoplastic radiation therapy: Secondary | ICD-10-CM | POA: Diagnosis not present

## 2021-11-13 DIAGNOSIS — C50411 Malignant neoplasm of upper-outer quadrant of right female breast: Secondary | ICD-10-CM | POA: Diagnosis not present

## 2021-11-14 ENCOUNTER — Ambulatory Visit
Admission: RE | Admit: 2021-11-14 | Discharge: 2021-11-14 | Disposition: A | Discharge status: Home / Self Care | Payer: BC Managed Care – PPO | Source: Ambulatory Visit | Attending: Radiation Oncology | Admitting: Radiation Oncology

## 2021-11-14 DIAGNOSIS — Z170 Estrogen receptor positive status [ER+]: Secondary | ICD-10-CM | POA: Diagnosis not present

## 2021-11-14 DIAGNOSIS — Z510 Encounter for antineoplastic radiation therapy: Secondary | ICD-10-CM | POA: Diagnosis not present

## 2021-11-14 DIAGNOSIS — C7951 Secondary malignant neoplasm of bone: Secondary | ICD-10-CM | POA: Diagnosis not present

## 2021-11-14 DIAGNOSIS — C50411 Malignant neoplasm of upper-outer quadrant of right female breast: Secondary | ICD-10-CM | POA: Diagnosis not present

## 2021-11-17 ENCOUNTER — Ambulatory Visit
Admission: RE | Admit: 2021-11-17 | Discharge: 2021-11-17 | Disposition: A | Discharge status: Home / Self Care | Payer: BC Managed Care – PPO | Source: Ambulatory Visit | Attending: Radiation Oncology | Admitting: Radiation Oncology

## 2021-11-17 ENCOUNTER — Other Ambulatory Visit: Payer: Self-pay

## 2021-11-17 DIAGNOSIS — C7951 Secondary malignant neoplasm of bone: Secondary | ICD-10-CM | POA: Diagnosis not present

## 2021-11-17 DIAGNOSIS — C50411 Malignant neoplasm of upper-outer quadrant of right female breast: Secondary | ICD-10-CM | POA: Diagnosis not present

## 2021-11-17 DIAGNOSIS — Z510 Encounter for antineoplastic radiation therapy: Secondary | ICD-10-CM | POA: Diagnosis not present

## 2021-11-17 DIAGNOSIS — Z170 Estrogen receptor positive status [ER+]: Secondary | ICD-10-CM | POA: Diagnosis not present

## 2021-11-18 ENCOUNTER — Ambulatory Visit
Admission: RE | Admit: 2021-11-18 | Discharge: 2021-11-18 | Disposition: A | Discharge status: Home / Self Care | Payer: BC Managed Care – PPO | Source: Ambulatory Visit | Attending: Radiation Oncology | Admitting: Radiation Oncology

## 2021-11-18 DIAGNOSIS — C7951 Secondary malignant neoplasm of bone: Secondary | ICD-10-CM | POA: Diagnosis not present

## 2021-11-18 DIAGNOSIS — Z510 Encounter for antineoplastic radiation therapy: Secondary | ICD-10-CM | POA: Diagnosis not present

## 2021-11-18 DIAGNOSIS — Z170 Estrogen receptor positive status [ER+]: Secondary | ICD-10-CM | POA: Diagnosis not present

## 2021-11-18 DIAGNOSIS — C50411 Malignant neoplasm of upper-outer quadrant of right female breast: Secondary | ICD-10-CM | POA: Diagnosis not present

## 2021-11-19 ENCOUNTER — Ambulatory Visit
Admission: RE | Admit: 2021-11-19 | Discharge: 2021-11-19 | Disposition: A | Discharge status: Home / Self Care | Payer: BC Managed Care – PPO | Source: Ambulatory Visit | Attending: Radiation Oncology | Admitting: Radiation Oncology

## 2021-11-19 ENCOUNTER — Other Ambulatory Visit: Payer: Self-pay

## 2021-11-19 DIAGNOSIS — C7951 Secondary malignant neoplasm of bone: Secondary | ICD-10-CM | POA: Diagnosis not present

## 2021-11-19 DIAGNOSIS — C50411 Malignant neoplasm of upper-outer quadrant of right female breast: Secondary | ICD-10-CM | POA: Diagnosis not present

## 2021-11-19 DIAGNOSIS — Z170 Estrogen receptor positive status [ER+]: Secondary | ICD-10-CM | POA: Diagnosis not present

## 2021-11-19 DIAGNOSIS — Z510 Encounter for antineoplastic radiation therapy: Secondary | ICD-10-CM | POA: Diagnosis not present

## 2021-11-20 ENCOUNTER — Inpatient Hospital Stay: Payer: BC Managed Care – PPO | Admitting: Hematology & Oncology

## 2021-11-20 ENCOUNTER — Other Ambulatory Visit: Payer: Self-pay

## 2021-11-20 ENCOUNTER — Inpatient Hospital Stay (HOSPITAL_BASED_OUTPATIENT_CLINIC_OR_DEPARTMENT_OTHER): Payer: BC Managed Care – PPO

## 2021-11-20 ENCOUNTER — Encounter: Payer: Self-pay | Admitting: Hematology & Oncology

## 2021-11-20 ENCOUNTER — Inpatient Hospital Stay: Payer: BC Managed Care – PPO

## 2021-11-20 ENCOUNTER — Encounter: Payer: Self-pay | Admitting: *Deleted

## 2021-11-20 ENCOUNTER — Telehealth: Payer: Self-pay | Admitting: *Deleted

## 2021-11-20 ENCOUNTER — Ambulatory Visit
Admission: RE | Admit: 2021-11-20 | Discharge: 2021-11-20 | Disposition: A | Discharge status: Home / Self Care | Payer: BC Managed Care – PPO | Source: Ambulatory Visit | Attending: Radiation Oncology | Admitting: Radiation Oncology

## 2021-11-20 VITALS — BP 130/80 | HR 79 | Temp 98.8°F | Resp 18 | Ht 68.0 in | Wt 193.8 lb

## 2021-11-20 VITALS — BP 139/69 | HR 75 | Resp 18

## 2021-11-20 DIAGNOSIS — Z170 Estrogen receptor positive status [ER+]: Secondary | ICD-10-CM | POA: Insufficient documentation

## 2021-11-20 DIAGNOSIS — C50919 Malignant neoplasm of unspecified site of unspecified female breast: Secondary | ICD-10-CM

## 2021-11-20 DIAGNOSIS — C7951 Secondary malignant neoplasm of bone: Secondary | ICD-10-CM

## 2021-11-20 DIAGNOSIS — C50411 Malignant neoplasm of upper-outer quadrant of right female breast: Secondary | ICD-10-CM | POA: Diagnosis not present

## 2021-11-20 DIAGNOSIS — Z510 Encounter for antineoplastic radiation therapy: Secondary | ICD-10-CM | POA: Diagnosis not present

## 2021-11-20 DIAGNOSIS — Z5111 Encounter for antineoplastic chemotherapy: Secondary | ICD-10-CM | POA: Insufficient documentation

## 2021-11-20 LAB — CBC WITH DIFFERENTIAL (CANCER CENTER ONLY)
Abs Immature Granulocytes: 0.02 10*3/uL (ref 0.00–0.07)
Basophils Absolute: 0.1 10*3/uL (ref 0.0–0.1)
Basophils Relative: 3 %
Eosinophils Absolute: 0 10*3/uL (ref 0.0–0.5)
Eosinophils Relative: 2 %
HCT: 36.1 % (ref 36.0–46.0)
Hemoglobin: 12.8 g/dL (ref 12.0–15.0)
Immature Granulocytes: 1 %
Lymphocytes Relative: 19 %
Lymphs Abs: 0.5 10*3/uL — ABNORMAL LOW (ref 0.7–4.0)
MCH: 35.7 pg — ABNORMAL HIGH (ref 26.0–34.0)
MCHC: 35.5 g/dL (ref 30.0–36.0)
MCV: 100.6 fL — ABNORMAL HIGH (ref 80.0–100.0)
Monocytes Absolute: 0.3 10*3/uL (ref 0.1–1.0)
Monocytes Relative: 11 %
Neutro Abs: 1.6 10*3/uL — ABNORMAL LOW (ref 1.7–7.7)
Neutrophils Relative %: 64 %
Platelet Count: 157 10*3/uL (ref 150–400)
RBC: 3.59 MIL/uL — ABNORMAL LOW (ref 3.87–5.11)
RDW: 13.3 % (ref 11.5–15.5)
WBC Count: 2.5 10*3/uL — ABNORMAL LOW (ref 4.0–10.5)
nRBC: 0 % (ref 0.0–0.2)

## 2021-11-20 LAB — CMP (CANCER CENTER ONLY)
ALT: 15 U/L (ref 0–44)
AST: 25 U/L (ref 15–41)
Albumin: 4.1 g/dL (ref 3.5–5.0)
Alkaline Phosphatase: 86 U/L (ref 38–126)
Anion gap: 8 (ref 5–15)
BUN: 14 mg/dL (ref 6–20)
CO2: 28 mmol/L (ref 22–32)
Calcium: 9.6 mg/dL (ref 8.9–10.3)
Chloride: 103 mmol/L (ref 98–111)
Creatinine: 0.79 mg/dL (ref 0.44–1.00)
GFR, Estimated: >60 mL/min (ref >60)
Glucose, Bld: 106 mg/dL — ABNORMAL HIGH (ref 70–99)
Potassium: 4.5 mmol/L (ref 3.5–5.1)
Sodium: 139 mmol/L (ref 135–145)
Total Bilirubin: 0.4 mg/dL (ref 0.3–1.2)
Total Protein: 6.6 g/dL (ref 6.5–8.1)

## 2021-11-20 MED ORDER — ZOLEDRONIC ACID 4 MG/100ML IV SOLN
4.0000 mg | Freq: Once | INTRAVENOUS | Status: AC
  Administered 2021-11-20: 4 mg via INTRAVENOUS
  Filled 2021-11-20: qty 100

## 2021-11-20 MED ORDER — FULVESTRANT 250 MG/5ML IM SOSY
500.0000 mg | PREFILLED_SYRINGE | Freq: Once | INTRAMUSCULAR | Status: AC
  Administered 2021-11-20: 500 mg via INTRAMUSCULAR
  Filled 2021-11-20: qty 10

## 2021-11-20 MED ORDER — SODIUM CHLORIDE 0.9 % IV SOLN: INTRAVENOUS | Status: DC

## 2021-11-20 NOTE — Progress Notes (Signed)
Hematology and Oncology Follow Up Visit  Annette James [id number] [birth year] 61 y.o. 11/20/2021   Principle Diagnosis:  Metastatic breast cancer-bone only metastasis-ER positive/PR positive/HER2 LOW ///  PIK3CA (+)   Current Therapy:        Faslodex 500 mg IM monthly-start on 11/06/2020 Ibrance 125 MG PO Q DAY (21 days on/7 days off) XRT to the RIGHT hip  --completed on 07/24/2021 Zometa 4 mg IV q. 3 months -- next dose on 02/2022   Interim History:  Annette James is here today for follow-up.  She is now doing some radiation therapy to the right hip area.  She is not symptomatic there.  When we had our last PET scan, there was some activity in that area.  I just thought that it would be reasonable to do some more radiation.  Her last CA 27.29 was trending upward.  I think was 44.  She has had no problems with nausea or vomiting.  She has had no issues with cough.  She has had no change in bowel or bladder habits.  She has had no problems with rashes.  She had no headache.  She is in radiation right now.  She has 2 more treatments left.  Overall, her performance status is ECOG 1.    Medications:  Allergies as of 11/20/2021   No Known Allergies      Medication List        Accurate as of November 20, 2021  8:10 AM. If you have any questions, ask your nurse or doctor.          ascorbic acid 500 MG tablet Commonly known as: VITAMIN C Take 500 mg by mouth daily.   cyclobenzaprine 5 MG tablet Commonly known as: FLEXERIL TAKE 1 TABLET BY MOUTH THREE TIMES A DAY AS NEEDED FOR MUSCLE SPASMS   fentaNYL 50 MCG/HR Commonly known as: [place] 1 patch onto the skin every other day.   Ibrance 125 MG tablet Generic drug: palbociclib TAKE 1 TABLET BY MOUTH ONCE DAILY WITH OR WITHOUT FOOD  FOR 21 DAYS, FOLLOWED BY 7  DAYS OFF, REPEATED EVERY 28 DAYS   ondansetron 8 MG tablet Commonly known as: ZOFRAN TAKE 1 TABLET (8 MG TOTAL) BY MOUTH EVERY 8 (EIGHT) HOURS AS NEEDED  FOR NAUSEA OR VOMITING.   One-A-Day Womens 50 Plus Tabs Take 1 tablet by mouth daily.   oxyCODONE 5 MG immediate release tablet Commonly known as: Oxy IR/ROXICODONE TAKE 1-2 TABLETS BY MOUTH EVERY 4 HOURS AS NEEDED        Allergies: No Known Allergies  Past Medical History, Surgical history, Social history, and Family History were reviewed and updated.  Review of Systems: Review of Systems  Constitutional: Negative.   HENT: Negative.    Eyes: Negative.   Respiratory: Negative.    Cardiovascular: Negative.   Gastrointestinal: Negative.   Genitourinary: Negative.   Musculoskeletal:  Positive for joint pain and neck pain.  Skin: Negative.   Neurological: Negative.   Endo/Heme/Allergies: Negative.   Psychiatric/Behavioral: Negative.       Physical Exam:  height is _0  (1.727 m) and weight is 193 lb 12.8 oz (87.9 kg). Her oral temperature is 98.8 F (37.1 C). Her blood pressure is 130/80 and her pulse is 79. Her respiration is 18 and oxygen saturation is 100%.   Wt Readings from Last 3 Encounters:  11/20/21 193 lb 12.8 oz (87.9 kg)  10/30/21 191 lb 6 oz (86.8 kg)  10/21/21 194 lb (88  kg)    Physical Exam Vitals reviewed.  HENT:     Head: Normocephalic and atraumatic.  Eyes:     Pupils: Pupils are equal, round, and reactive to light.  Cardiovascular:     Rate and Rhythm: Normal rate and regular rhythm.     Heart sounds: Normal heart sounds.  Pulmonary:     Effort: Pulmonary effort is normal.     Breath sounds: Normal breath sounds.  Abdominal:     General: Bowel sounds are normal.     Palpations: Abdomen is soft.  Musculoskeletal:        General: No tenderness or deformity. Normal range of motion.     Cervical back: Normal range of motion.  Lymphadenopathy:     Cervical: No cervical adenopathy.  Skin:    General: Skin is warm and dry.     Findings: No erythema or rash.  Neurological:     Mental Status: She is alert and oriented to person, place, and  time.  Psychiatric:        Behavior: Behavior normal.        Thought Content: Thought content normal.        Judgment: Judgment normal.     Lab Results  Component Value Date   WBC 2.5 (L) 11/20/2021   HGB 12.8 11/20/2021   HCT 36.1 11/20/2021   MCV 100.6 (H) 11/20/2021   PLT 157 11/20/2021   Lab Results  Component Value Date   FERRITIN 512 (H) 12/04/2020   IRON 96 12/04/2020   TIBC 244 12/04/2020   UIBC 147 12/04/2020   IRONPCTSAT 40 12/04/2020   Lab Results  Component Value Date   RBC 3.59 (L) 11/20/2021   Lab Results  Component Value Date   KAPLAMBRATIO 4.99 10/20/2020   Lab Results  Component Value Date   IGGSERUM 714 10/19/2020   IGA 153 10/19/2020   IGMSERUM 76 10/19/2020   Lab Results  Component Value Date   TOTALPROTELP 6.1 10/19/2020     Chemistry      Component Value Date/Time   NA 138 10/21/2021 0744   K 4.2 10/21/2021 0744   CL 101 10/21/2021 0744   CO2 30 10/21/2021 0744   BUN 11 10/21/2021 0744   CREATININE 0.76 10/21/2021 0744      Component Value Date/Time   CALCIUM 10.2 10/21/2021 0744   ALKPHOS 99 10/21/2021 0744   AST 20 10/21/2021 0744   ALT 11 10/21/2021 0744   BILITOT 0.5 10/21/2021 0744       Impression and Plan: Annette James is a very pleasant 61 yo caucasian female with metastatic breast cancer confined at this time to her bones.  She was diagnosed back in December 2021.  So far, she has been doing quite well.  Hopefully, the CA 27.29 will be stabilizing.  If not, I think we will have to make a change.  We can try her on Piqray.  I think this would be reasonable to try if we had to make a change.  She will get her Zometa today.  I will plan to get her back in 1 month.  Again, we may have to think about making a change with her protocol if we find that the CA 27.29 is increasing.    Volanda Napoleon, MD 2/9/20238:10 AM

## 2021-11-20 NOTE — Patient Instructions (Signed)

## 2021-11-20 NOTE — Progress Notes (Signed)
Oncology Nurse Navigator Documentation  Oncology Nurse Navigator Flowsheets 11/20/2021  Abnormal Finding Date -  Confirmed Diagnosis Date -  Diagnosis Status -  Planned Course of Treatment -  Phase of Treatment -  Chemotherapy Pending- Reason: -  Chemotherapy Actual Start Date: -  Radiation Actual Start Date: -  Radiation Expected End Date: -  Navigator Follow Up Date: 12/18/2021  Navigator Follow Up Reason: Follow-up Appointment  Navigator Location [facility]  Navigator Encounter Type Appt/Treatment Plan Review  Telephone -  Treatment Initiated Date -  Patient Visit Type MedOnc  Treatment Phase Active Tx  Barriers/Navigation Needs No Barriers At This Time  Education -  Interventions None Required  Acuity Level 2-Minimal Needs (1-2 Barriers Identified)  Referrals -  Coordination of Care -  Education Method -  Support Groups/Services Friends and Family  Time Spent with Patient 15

## 2021-11-20 NOTE — Telephone Encounter (Signed)
Per 11/20/21 los - called and lvm of upcoming appointments - requested call back to confirm

## 2021-11-20 NOTE — Patient Instructions (Signed)
Fulvestrant injection °What is this medication? °FULVESTRANT (ful VES trant) blocks the effects of estrogen. It is used to treat breast cancer. °This medicine may be used for other purposes; ask your health care provider or pharmacist if you have questions. °COMMON BRAND NAME(S): FASLODEX °What should I tell my care team before I take this medication? °They need to know if you have any of these conditions: °bleeding disorders °liver disease °low blood counts, like low white cell, platelet, or red cell counts °an unusual or allergic reaction to fulvestrant, other medicines, foods, dyes, or preservatives °pregnant or trying to get pregnant °breast-feeding °How should I use this medication? °This medicine is for injection into a muscle. It is usually given by a health care professional in a hospital or clinic setting. °Talk to your pediatrician regarding the use of this medicine in children. Special care may be needed. °Overdosage: If you think you have taken too much of this medicine contact a poison control center or emergency room at once. °NOTE: This medicine is only for you. Do not share this medicine with others. °What if I miss a dose? °It is important not to miss your dose. Call your doctor or health care professional if you are unable to keep an appointment. °What may interact with this medication? °medicines that treat or prevent blood clots like warfarin, enoxaparin, dalteparin, apixaban, dabigatran, and rivaroxaban °This list may not describe all possible interactions. Give your health care provider a list of all the medicines, herbs, non-prescription drugs, or dietary supplements you use. Also tell them if you smoke, drink alcohol, or use illegal drugs. Some items may interact with your medicine. °What should I watch for while using this medication? °Your condition will be monitored carefully while you are receiving this medicine. You will need important blood work done while you are taking this  medicine. °Do not become pregnant while taking this medicine or for at least 1 year after stopping it. Women of child-bearing potential will need to have a negative pregnancy test before starting this medicine. Women should inform their doctor if they wish to become pregnant or think they might be pregnant. There is a potential for serious side effects to an unborn child. Men should inform their doctors if they wish to father a child. This medicine may lower sperm counts. Talk to your health care professional or pharmacist for more information. Do not breast-feed an infant while taking this medicine or for 1 year after the last dose. °What side effects may I notice from receiving this medication? °Side effects that you should report to your doctor or health care professional as soon as possible: °allergic reactions like skin rash, itching or hives, swelling of the face, lips, or tongue °feeling faint or lightheaded, falls °pain, tingling, numbness, or weakness in the legs °signs and symptoms of infection like fever or chills; cough; flu-like symptoms; sore throat °vaginal bleeding °Side effects that usually do not require medical attention (report to your doctor or health care professional if they continue or are bothersome): °aches, pains °constipation °diarrhea °headache °hot flashes °nausea, vomiting °pain at site where injected °stomach pain °This list may not describe all possible side effects. Call your doctor for medical advice about side effects. You may report side effects to FDA at 1-800-FDA-1088. °Where should I keep my medication? °This drug is given in a hospital or clinic and will not be stored at home. °NOTE: This sheet is a summary. It may not cover all possible information. If you have   questions about this medicine, talk to your doctor, pharmacist, or health care provider. °© 2022 [employer]vier/Gold Standard (2018-01-11 00:00:00) ° °

## 2021-11-21 ENCOUNTER — Ambulatory Visit
Admission: RE | Admit: 2021-11-21 | Discharge: 2021-11-21 | Disposition: A | Discharge status: Home / Self Care | Payer: BC Managed Care – PPO | Source: Ambulatory Visit | Attending: Radiation Oncology | Admitting: Radiation Oncology

## 2021-11-21 ENCOUNTER — Encounter: Payer: Self-pay | Admitting: Radiation Oncology

## 2021-11-21 ENCOUNTER — Telehealth: Payer: Self-pay | Admitting: *Deleted

## 2021-11-21 DIAGNOSIS — Z170 Estrogen receptor positive status [ER+]: Secondary | ICD-10-CM | POA: Diagnosis not present

## 2021-11-21 DIAGNOSIS — C7951 Secondary malignant neoplasm of bone: Secondary | ICD-10-CM | POA: Diagnosis not present

## 2021-11-21 DIAGNOSIS — C50411 Malignant neoplasm of upper-outer quadrant of right female breast: Secondary | ICD-10-CM | POA: Diagnosis not present

## 2021-11-21 DIAGNOSIS — Z510 Encounter for antineoplastic radiation therapy: Secondary | ICD-10-CM | POA: Diagnosis not present

## 2021-11-21 LAB — CANCER ANTIGEN 27.29: CA 27.29: 39.7 U/mL — ABNORMAL HIGH (ref 0.0–38.6)

## 2021-11-21 NOTE — Telephone Encounter (Signed)
As noted below by Dr. Marin Olp, I informed the patient that the tumor level went down! She verbalized understanding.

## 2021-11-21 NOTE — Telephone Encounter (Signed)
-----   Message from Volanda Napoleon, MD sent at 11/21/2021  6:18 AM EST ----- Call - the tumor level went down a little!!!  [place] job!!   Laurey Arrow

## 2021-11-24 ENCOUNTER — Ambulatory Visit: Payer: BC Managed Care – PPO

## 2021-11-24 ENCOUNTER — Other Ambulatory Visit (HOSPITAL_COMMUNITY): Payer: Self-pay

## 2021-11-24 ENCOUNTER — Encounter: Payer: Self-pay | Admitting: Hematology & Oncology

## 2021-11-24 ENCOUNTER — Telehealth: Payer: Self-pay

## 2021-11-24 ENCOUNTER — Encounter: Payer: Self-pay | Admitting: *Deleted

## 2021-11-24 NOTE — Telephone Encounter (Signed)
Reached out to patient to verify the pharmacy that she will be receiving the Ibrance from as patient sent a MyChart message in reference to her Ibrance.  Pt stated that she will be receiving it from [facility]. All this information sent to oral chemotherapy pharmacy tech. Pt aware that prior authorization will be looked at and completed. Pt aware that this RN will reach out if there is anymore information pertinent to the medication and delivery. Pt aware to call or send a MyChart message with any other questions or concerns. Pt very kind and appreciative of call verbalizing her frustration with prior authorizations.

## 2021-11-25 ENCOUNTER — Ambulatory Visit: Payer: BC Managed Care – PPO

## 2021-11-26 ENCOUNTER — Ambulatory Visit: Payer: BC Managed Care – PPO

## 2021-11-27 ENCOUNTER — Ambulatory Visit: Payer: BC Managed Care – PPO

## 2021-12-01 ENCOUNTER — Other Ambulatory Visit: Payer: Self-pay

## 2021-12-01 DIAGNOSIS — C50919 Malignant neoplasm of unspecified site of unspecified female breast: Secondary | ICD-10-CM

## 2021-12-01 MED ORDER — PALBOCICLIB 125 MG PO TABS: ORAL_TABLET | ORAL | 4 refills | Status: DC

## 2021-12-05 ENCOUNTER — Other Ambulatory Visit: Payer: Self-pay | Admitting: Hematology & Oncology

## 2021-12-05 DIAGNOSIS — C50919 Malignant neoplasm of unspecified site of unspecified female breast: Secondary | ICD-10-CM

## 2021-12-05 DIAGNOSIS — C7951 Secondary malignant neoplasm of bone: Secondary | ICD-10-CM

## 2021-12-05 MED ORDER — OXYCODONE HCL 5 MG PO TABS: ORAL_TABLET | ORAL | 0 refills | Status: DC | PRN

## 2021-12-10 ENCOUNTER — Other Ambulatory Visit: Payer: Self-pay | Admitting: Hematology & Oncology

## 2021-12-10 DIAGNOSIS — C50919 Malignant neoplasm of unspecified site of unspecified female breast: Secondary | ICD-10-CM

## 2021-12-10 DIAGNOSIS — C7951 Secondary malignant neoplasm of bone: Secondary | ICD-10-CM

## 2021-12-10 MED ORDER — FENTANYL 50 MCG/HR TD PT72: 1.0000 | MEDICATED_PATCH | TRANSDERMAL | 0 refills | Status: DC

## 2021-12-18 ENCOUNTER — Encounter: Payer: Self-pay | Admitting: Radiation Oncology

## 2021-12-18 ENCOUNTER — Inpatient Hospital Stay: Payer: BC Managed Care – PPO

## 2021-12-18 ENCOUNTER — Other Ambulatory Visit: Payer: Self-pay

## 2021-12-18 ENCOUNTER — Encounter: Payer: Self-pay | Admitting: Hematology & Oncology

## 2021-12-18 ENCOUNTER — Inpatient Hospital Stay (HOSPITAL_BASED_OUTPATIENT_CLINIC_OR_DEPARTMENT_OTHER): Payer: BC Managed Care – PPO | Admitting: Hematology & Oncology

## 2021-12-18 ENCOUNTER — Encounter: Payer: Self-pay | Admitting: *Deleted

## 2021-12-18 ENCOUNTER — Other Ambulatory Visit: Payer: Self-pay | Admitting: *Deleted

## 2021-12-18 ENCOUNTER — Telehealth: Payer: Self-pay | Admitting: *Deleted

## 2021-12-18 ENCOUNTER — Other Ambulatory Visit (HOSPITAL_BASED_OUTPATIENT_CLINIC_OR_DEPARTMENT_OTHER): Payer: Self-pay

## 2021-12-18 ENCOUNTER — Inpatient Hospital Stay: Payer: BC Managed Care – PPO | Attending: Hematology & Oncology

## 2021-12-18 VITALS — BP 141/85 | HR 80 | Temp 98.1°F | Resp 18 | Ht 68.0 in | Wt 193.8 lb

## 2021-12-18 DIAGNOSIS — C7951 Secondary malignant neoplasm of bone: Secondary | ICD-10-CM | POA: Insufficient documentation

## 2021-12-18 DIAGNOSIS — R7402 Elevation of levels of lactic acid dehydrogenase (LDH): Secondary | ICD-10-CM | POA: Diagnosis not present

## 2021-12-18 DIAGNOSIS — Z170 Estrogen receptor positive status [ER+]: Secondary | ICD-10-CM | POA: Insufficient documentation

## 2021-12-18 DIAGNOSIS — C50919 Malignant neoplasm of unspecified site of unspecified female breast: Secondary | ICD-10-CM

## 2021-12-18 DIAGNOSIS — Z5111 Encounter for antineoplastic chemotherapy: Secondary | ICD-10-CM | POA: Diagnosis not present

## 2021-12-18 DIAGNOSIS — R112 Nausea with vomiting, unspecified: Secondary | ICD-10-CM

## 2021-12-18 LAB — LACTATE DEHYDROGENASE: LDH: 168 U/L (ref 98–192)

## 2021-12-18 LAB — CMP (CANCER CENTER ONLY)
ALT: 21 U/L (ref 0–44)
AST: 30 U/L (ref 15–41)
Albumin: 3.8 g/dL (ref 3.5–5.0)
Alkaline Phosphatase: 117 U/L (ref 38–126)
Anion gap: 9 (ref 5–15)
BUN: 13 mg/dL (ref 6–20)
CO2: 29 mmol/L (ref 22–32)
Calcium: 8.9 mg/dL (ref 8.9–10.3)
Chloride: 102 mmol/L (ref 98–111)
Creatinine: 0.78 mg/dL (ref 0.44–1.00)
GFR, Estimated: >60 mL/min (ref >60)
Glucose, Bld: 111 mg/dL — ABNORMAL HIGH (ref 70–99)
Potassium: 4 mmol/L (ref 3.5–5.1)
Sodium: 140 mmol/L (ref 135–145)
Total Bilirubin: 0.7 mg/dL (ref 0.3–1.2)
Total Protein: 6.5 g/dL (ref 6.5–8.1)

## 2021-12-18 LAB — CBC WITH DIFFERENTIAL (CANCER CENTER ONLY)
Abs Immature Granulocytes: 0.02 10*3/uL (ref 0.00–0.07)
Basophils Absolute: 0.1 10*3/uL (ref 0.0–0.1)
Basophils Relative: 2 %
Eosinophils Absolute: 0 10*3/uL (ref 0.0–0.5)
Eosinophils Relative: 1 %
HCT: 34 % — ABNORMAL LOW (ref 36.0–46.0)
Hemoglobin: 12.2 g/dL (ref 12.0–15.0)
Immature Granulocytes: 1 %
Lymphocytes Relative: 14 %
Lymphs Abs: 0.6 10*3/uL — ABNORMAL LOW (ref 0.7–4.0)
MCH: 35.5 pg — ABNORMAL HIGH (ref 26.0–34.0)
MCHC: 35.9 g/dL (ref 30.0–36.0)
MCV: 98.8 fL (ref 80.0–100.0)
Monocytes Absolute: 0.3 10*3/uL (ref 0.1–1.0)
Monocytes Relative: 8 %
Neutro Abs: 3 10*3/uL (ref 1.7–7.7)
Neutrophils Relative %: 74 %
Platelet Count: 171 10*3/uL (ref 150–400)
RBC: 3.44 MIL/uL — ABNORMAL LOW (ref 3.87–5.11)
RDW: 13.5 % (ref 11.5–15.5)
Smear Review: NORMAL
WBC Count: 4 10*3/uL (ref 4.0–10.5)
nRBC: 0 % (ref 0.0–0.2)

## 2021-12-18 MED ORDER — ONDANSETRON HCL 8 MG PO TABS
8.0000 mg | ORAL_TABLET | Freq: Three times a day (TID) | ORAL | 1 refills | Status: DC | PRN
  Filled 2021-12-18: qty 9, 30d supply, fill #0
  Filled 2022-01-15: qty 9, 30d supply, fill #1
  Filled 2022-02-11: qty 9, 30d supply, fill #2
  Filled 2022-03-11: qty 9, 30d supply, fill #3
  Filled 2022-04-04 – 2022-05-09 (×2): qty 9, 30d supply, fill #4
  Filled 2022-06-09: qty 9, 30d supply, fill #5
  Filled 2022-07-14: qty 9, 30d supply, fill #6
  Filled 2022-08-11: qty 9, 30d supply, fill #7
  Filled 2022-09-14: qty 9, 30d supply, fill #8
  Filled 2022-10-14: qty 9, 30d supply, fill #9
  Filled 2022-11-10: qty 9, 30d supply, fill #10
  Filled 2022-12-08: qty 9, 30d supply, fill #11
  Filled ????-??-??: fill #4

## 2021-12-18 MED ORDER — FULVESTRANT 250 MG/5ML IM SOSY
500.0000 mg | PREFILLED_SYRINGE | Freq: Once | INTRAMUSCULAR | Status: AC
  Administered 2021-12-18: 09:00:00 500 mg via INTRAMUSCULAR

## 2021-12-18 NOTE — Patient Instructions (Signed)
Fulvestrant injection °What is this medication? °FULVESTRANT (ful VES trant) blocks the effects of estrogen. It is used to treat breast cancer. °This medicine may be used for other purposes; ask your health care provider or pharmacist if you have questions. °COMMON BRAND NAME(S): FASLODEX °What should I tell my care team before I take this medication? °They need to know if you have any of these conditions: °bleeding disorders °liver disease °low blood counts, like low white cell, platelet, or red cell counts °an unusual or allergic reaction to fulvestrant, other medicines, foods, dyes, or preservatives °pregnant or trying to get pregnant °breast-feeding °How should I use this medication? °This medicine is for injection into a muscle. It is usually given by a health care professional in a hospital or clinic setting. °Talk to your pediatrician regarding the use of this medicine in children. Special care may be needed. °Overdosage: If you think you have taken too much of this medicine contact a poison control center or emergency room at once. °NOTE: This medicine is only for you. Do not share this medicine with others. °What if I miss a dose? °It is important not to miss your dose. Call your doctor or health care professional if you are unable to keep an appointment. °What may interact with this medication? °medicines that treat or prevent blood clots like warfarin, enoxaparin, dalteparin, apixaban, dabigatran, and rivaroxaban °This list may not describe all possible interactions. Give your health care provider a list of all the medicines, herbs, non-prescription drugs, or dietary supplements you use. Also tell them if you smoke, drink alcohol, or use illegal drugs. Some items may interact with your medicine. °What should I watch for while using this medication? °Your condition will be monitored carefully while you are receiving this medicine. You will need important blood work done while you are taking this  medicine. °Do not become pregnant while taking this medicine or for at least 1 year after stopping it. Women of child-bearing potential will need to have a negative pregnancy test before starting this medicine. Women should inform their doctor if they wish to become pregnant or think they might be pregnant. There is a potential for serious side effects to an unborn child. Men should inform their doctors if they wish to father a child. This medicine may lower sperm counts. Talk to your health care professional or pharmacist for more information. Do not breast-feed an infant while taking this medicine or for 1 year after the last dose. °What side effects may I notice from receiving this medication? °Side effects that you should report to your doctor or health care professional as soon as possible: °allergic reactions like skin rash, itching or hives, swelling of the face, lips, or tongue °feeling faint or lightheaded, falls °pain, tingling, numbness, or weakness in the legs °signs and symptoms of infection like fever or chills; cough; flu-like symptoms; sore throat °vaginal bleeding °Side effects that usually do not require medical attention (report to your doctor or health care professional if they continue or are bothersome): °aches, pains °constipation °diarrhea °headache °hot flashes °nausea, vomiting °pain at site where injected °stomach pain °This list may not describe all possible side effects. Call your doctor for medical advice about side effects. You may report side effects to FDA at 1-800-FDA-1088. °Where should I keep my medication? °This drug is given in a hospital or clinic and will not be stored at home. °NOTE: This sheet is a summary. It may not cover all possible information. If you have   questions about this medicine, talk to your doctor, pharmacist, or health care provider. °© 2022 [employer]vier/Gold Standard (2018-01-11 00:00:00) ° °

## 2021-12-18 NOTE — Progress Notes (Signed)
?Hematology and Oncology Follow Up Visit ? ?Annette James ?[id number] ?14-Oct-1960 61 y.o. ?12/18/2021 ? ? ?Principle Diagnosis:  ?Metastatic breast cancer-bone only metastasis-ER positive/PR positive/HER2 LOW ///  PIK3CA (+) ?  ?Current Therapy:        ?Faslodex 500 mg IM monthly-start on 11/06/2020 ?Ibrance 125 MG PO Q DAY (21 days on/7 days off) ?XRT to the RIGHT hip  --completed on 07/24/2021 ?Zometa 4 mg IV q. 3 months -- next dose on 02/2022 ?  ?Interim History:  Annette James is here today for follow-up.  She is doing quite well.  She had radiation therapy to the right hip.  She tolerated this well.  She feels this up back in February. ? ?Her last CA 27.29 was low but better at 39. ? ?She does not complain of any pain right now.  She is having no problems working.  She is having no change in bowel or bladder habits.  There is no cough or shortness of breath.  She had no headache.  There is been no rashes.  She has had no leg swelling. ? ?Overall, I would say her performance status is ECOG 0.   ? ?Medications:  ?Allergies as of 12/18/2021   ?No Known Allergies ?  ? ?  ?Medication List  ?  ? ?  ? Accurate as of December 18, 2021  8:57 AM. If you have any questions, ask your nurse or doctor.  ?  ?  ? ?  ? ?ascorbic acid 500 MG tablet ?Commonly known as: VITAMIN C ?Take 500 mg by mouth daily. ?  ?cyclobenzaprine 5 MG tablet ?Commonly known as: FLEXERIL ?TAKE 1 TABLET BY MOUTH THREE TIMES A DAY AS NEEDED FOR MUSCLE SPASMS ?  ?fentaNYL 50 MCG/HR ?Commonly known as: [place] ?Place 1 patch onto the skin every other day. ?  ?One-A-Day Womens 50 Plus Tabs ?Take 1 tablet by mouth daily. ?  ?oxyCODONE 5 MG immediate release tablet ?Commonly known as: Oxy IR/ROXICODONE ?TAKE 1-2 TABLETS BY MOUTH EVERY 4 HOURS AS NEEDED ?  ?palbociclib 125 MG tablet ?Commonly known as: Ibrance ?TAKE 1 TABLET BY MOUTH ONCE DAILY WITH OR WITHOUT FOOD  FOR 21 DAYS, FOLLOWED BY 7  DAYS OFF, REPEATED EVERY 28 DAYS ?  ? ?  ? ? ?Allergies: No Known  Allergies ? ?Past Medical History, Surgical history, Social history, and Family History were reviewed and updated. ? ?Review of Systems: ?Review of Systems  ?Constitutional: Negative.   ?HENT: Negative.    ?Eyes: Negative.   ?Respiratory: Negative.    ?Cardiovascular: Negative.   ?Gastrointestinal: Negative.   ?Genitourinary: Negative.   ?Musculoskeletal:  Positive for joint pain and neck pain.  ?Skin: Negative.   ?Neurological: Negative.   ?Endo/Heme/Allergies: Negative.   ?Psychiatric/Behavioral: Negative.    ?  ? ?Physical Exam: ? height is '5\' 8"'  (1.727 m) and weight is 193 lb 12.8 oz (87.9 kg). Her oral temperature is 98.1 ?F (36.7 ?C). Her blood pressure is 141/85 (abnormal) and her pulse is 80. Her respiration is 18 and oxygen saturation is 100%.  ? ?Wt Readings from Last 3 Encounters:  ?12/18/21 193 lb 12.8 oz (87.9 kg)  ?11/20/21 193 lb 12.8 oz (87.9 kg)  ?10/30/21 191 lb 6 oz (86.8 kg)  ? ? ?Physical Exam ?Vitals reviewed.  ?HENT:  ?   Head: Normocephalic and atraumatic.  ?Eyes:  ?   Pupils: Pupils are equal, round, and reactive to light.  ?Cardiovascular:  ?   Rate and Rhythm: Normal rate  and regular rhythm.  ?   Heart sounds: Normal heart sounds.  ?Pulmonary:  ?   Effort: Pulmonary effort is normal.  ?   Breath sounds: Normal breath sounds.  ?Abdominal:  ?   General: Bowel sounds are normal.  ?   Palpations: Abdomen is soft.  ?Musculoskeletal:     ?   General: No tenderness or deformity. Normal range of motion.  ?   Cervical back: Normal range of motion.  ?Lymphadenopathy:  ?   Cervical: No cervical adenopathy.  ?Skin: ?   General: Skin is warm and dry.  ?   Findings: No erythema or rash.  ?Neurological:  ?   Mental Status: She is alert and oriented to person, place, and time.  ?Psychiatric:     ?   Behavior: Behavior normal.     ?   Thought Content: Thought content normal.     ?   Judgment: Judgment normal.  ? ? ? ?Lab Results  ?Component Value Date  ? WBC 4.0 12/18/2021  ? HGB 12.2 12/18/2021  ? HCT  34.0 (L) 12/18/2021  ? MCV 98.8 12/18/2021  ? PLT 171 12/18/2021  ? ?Lab Results  ?Component Value Date  ? FERRITIN 512 (H) 12/04/2020  ? IRON 96 12/04/2020  ? TIBC 244 12/04/2020  ? UIBC 147 12/04/2020  ? IRONPCTSAT 40 12/04/2020  ? ?Lab Results  ?Component Value Date  ? RBC 3.44 (L) 12/18/2021  ? ?Lab Results  ?Component Value Date  ? KAPLAMBRATIO 4.99 10/20/2020  ? ?Lab Results  ?Component Value Date  ? IGGSERUM 714 10/19/2020  ? IGA 153 10/19/2020  ? IGMSERUM 76 10/19/2020  ? ?Lab Results  ?Component Value Date  ? TOTALPROTELP 6.1 10/19/2020  ? ?  Chemistry   ?   ?Component Value Date/Time  ? NA 140 12/18/2021 0819  ? K 4.0 12/18/2021 0819  ? CL 102 12/18/2021 0819  ? CO2 29 12/18/2021 0819  ? BUN 13 12/18/2021 0819  ? CREATININE 0.78 12/18/2021 0819  ?    ?Component Value Date/Time  ? CALCIUM 8.9 12/18/2021 0819  ? ALKPHOS 117 12/18/2021 0819  ? AST 30 12/18/2021 0819  ? ALT 21 12/18/2021 0819  ? BILITOT 0.7 12/18/2021 0819  ?  ? ? ? ?Impression and Plan: Annette James is a very pleasant 61 yo caucasian female with metastatic breast cancer confined at this time to her bones.  She was diagnosed back in December 2021. ? ?Hopefully, the radiation therapy will help.  She really did not have any problems in that area.  However, some studies have shown that doing radiation therapy to asymptomatic areas of bone involvement does seem to improve survival. ? ?We will see what the CA 27.29 is. ? ?She will get the Faslodex today. ? ?We will have her come back in 1 month.  After the next Faslodex, then we will set her up with scans and see how everything looks. ? ? ?Volanda Napoleon, MD ?3/9/20238:57 AM ?

## 2021-12-18 NOTE — Progress Notes (Signed)
Oncology Nurse Navigator Documentation ? ?Oncology Nurse Navigator Flowsheets 12/18/2021  ?Abnormal Finding Date -  ?Confirmed Diagnosis Date -  ?Diagnosis Status -  ?Planned Course of Treatment -  ?Phase of Treatment -  ?Chemotherapy Pending- Reason: -  ?Chemotherapy Actual Start Date: -  ?Radiation Actual Start Date: -  ?Radiation Expected End Date: -  ?Navigator Follow Up Date: 01/15/2022  ?Navigator Follow Up Reason: Follow-up Appointment  ?Navigator Location [facility]  ?Navigator Encounter Type Appt/Treatment Plan Review  ?Telephone -  ?Treatment Initiated Date -  ?Patient Visit Type MedOnc  ?Treatment Phase Active Tx  ?Barriers/Navigation Needs No Barriers At This Time  ?Education -  ?Interventions None Required  ?Acuity Level 2-Minimal Needs (1-2 Barriers Identified)  ?Referrals -  ?Coordination of Care -  ?Education Method -  ?Support Groups/Services Friends and Family  ?Time Spent with Patient 15  ?  ?

## 2021-12-18 NOTE — Telephone Encounter (Signed)
Per 12/18/21 los - gave upcoming appointments - confirmed ?

## 2021-12-19 LAB — CANCER ANTIGEN 27.29: CA 27.29: 49.4 U/mL — ABNORMAL HIGH (ref 0.0–38.6)

## 2021-12-24 NOTE — Progress Notes (Signed)
?Radiation Oncology         [phone] ?________________________________ ? ?Name: Annette James MRN: [id number]  ?Date: 12/25/2021  DOB: [birth year] ? ?Follow-Up Visit Note ? ?CC: Annette Lima, MD  Volanda Napoleon, MD ? ?  ICD-10-CM   ?1. Breast cancer metastasized to bone, unspecified laterality ([place])  C50.919   ? C79.51   ?  ? ? ?Diagnosis:  The encounter diagnosis was Breast cancer metastasized to bone, unspecified laterality ([place]). ?  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 ?Interval Since Last Radiation: 1 month and 6 days  ? ?Intent: Palliative ? ?Radiation Treatment Dates: 11/10/2021 through 11/21/2021 ?Site Technique Total Dose (Gy) Dose per Fx (Gy) Completed Fx Beam Energies  ?Hip, Right: Pelvis_R 3D 30/30 3 10/10 15X  ? ? ?Narrative:  The patient returns today for routine follow-up. The patient tolerated radiation therapy quite well. During her final weekly treatment check on 11/18/21, the only symptom reported by the patient was mild fatigue (only symptom reported during RT). She otherwise denied any other symptoms.        ? ?Since the patient was last seen to re-evaluation on 10/30/21, the patient met with Dr. Marin Olp on 11/20/21. During which time, the patient was noted to be feeling well, and continues on zometa. Per Dr. Marin Olp, her last CA 27.29 was trending upward around that time. If her CA 27.29 does not stabilize, Dr. Marin Olp would possibly like to try her on [place].  Per her most recent follow up with Dr. Marin Olp on 12/18/21, the patient was again noted as asymptomatic. She received faslodex during this visit as well.  ? ?Otherwise, no significant interval history since the patient was last seen.  ? ?On evaluation today the patient denies any pain in the right pelvis area.  She denies any pain with standing or walking.  She denies any swelling in her right leg.  She did have some fatigue after her treatment but  has returned back to her normal baseline energy level.   ? ?                       ? ?Allergies:  has No Known Allergies. ? ?Meds: ?Current Outpatient Medications  ?Medication Sig Dispense Refill  ? ascorbic acid (VITAMIN C) 500 MG tablet Take 500 mg by mouth daily.    ? cyclobenzaprine (FLEXERIL) 5 MG tablet TAKE 1 TABLET BY MOUTH THREE TIMES A DAY AS NEEDED FOR MUSCLE SPASMS 30 tablet 2  ? fentaNYL (DURAGESIC) 50 MCG/HR Place 1 patch onto the skin every other day. 15 patch 0  ? Multiple Vitamins-Minerals (ONE-A-DAY WOMENS 50 PLUS) TABS Take 1 tablet by mouth daily.    ? ondansetron (ZOFRAN) 8 MG tablet Take 1 tablet (8 mg total) by mouth every 8 (eight) hours as needed for nausea or vomiting. 90 tablet 1  ? oxyCODONE (OXY IR/ROXICODONE) 5 MG immediate release tablet TAKE 1-2 TABLETS BY MOUTH EVERY 4 HOURS AS NEEDED 60 tablet 0  ? palbociclib (IBRANCE) 125 MG tablet TAKE 1 TABLET BY MOUTH ONCE DAILY WITH OR WITHOUT FOOD  FOR 21 DAYS, FOLLOWED BY 7  DAYS OFF, REPEATED EVERY 28 DAYS 21 tablet 4  ? ?No current facility-administered medications for this encounter.  ? ? ?Physical Findings: ?The patient is in no acute distress. Patient is alert and oriented. ? height is '5\' 8"'  (1.727 m) and weight is  192 lb 6.4 oz (87.3 kg). Her temperature is 97.6 ?F (36.4 ?C). Her blood pressure is 152/85 (abnormal) and her pulse is 84. Her respiration is 20 and oxygen saturation is 100%. .  No significant changes. Lungs are clear to auscultation bilaterally. Heart has regular rate and rhythm. No palpable cervical, supraclavicular, or axillary adenopathy. Abdomen soft, non-tender, normal bowel sounds.  Motor strength good in both lower extremities distal and proximal muscle groups. ? ? ? ? ? ?Lab Findings: ?Lab Results  ?Component Value Date  ? WBC 4.0 12/18/2021  ? HGB 12.2 12/18/2021  ? HCT 34.0 (L) 12/18/2021  ? MCV 98.8 12/18/2021  ? PLT 171 12/18/2021  ? ? ?Radiographic Findings: ?No results found. ? ?Impression:  Stage IV Right  Breast UOQ Invasive Ductal Carcinoma with DCIS and bony metastases, ER+ / PR+ / Her2-, Grade 2 ? ? ?Patient has fully recovered from her palliative radiation therapy.  She denies any pain along the right pelvis area at this time. ? ?Plan: As needed follow-up in radiation oncology.  She will continue close follow-up in medical oncology. ? ? ? ?____________________________________ ? ?Blair Promise, PhD, MD ? ? ?This document serves as a record of services personally performed by Gery Pray, MD. It was created on his behalf by Roney Mans, a trained medical scribe. The creation of this record is based on the scribe's personal observations and the provider's statements to them. This document has been checked and approved by the attending provider. ? ?

## 2021-12-25 ENCOUNTER — Ambulatory Visit
Admission: RE | Admit: 2021-12-25 | Discharge: 2021-12-25 | Disposition: A | Discharge status: Home / Self Care | Payer: BC Managed Care – PPO | Source: Ambulatory Visit | Attending: Radiation Oncology | Admitting: Radiation Oncology

## 2021-12-25 ENCOUNTER — Encounter: Payer: Self-pay | Admitting: Radiation Oncology

## 2021-12-25 ENCOUNTER — Other Ambulatory Visit: Payer: Self-pay

## 2021-12-25 DIAGNOSIS — Z79899 Other long term (current) drug therapy: Secondary | ICD-10-CM | POA: Insufficient documentation

## 2021-12-25 DIAGNOSIS — C50411 Malignant neoplasm of upper-outer quadrant of right female breast: Secondary | ICD-10-CM | POA: Diagnosis not present

## 2021-12-25 DIAGNOSIS — C7951 Secondary malignant neoplasm of bone: Secondary | ICD-10-CM | POA: Diagnosis not present

## 2021-12-25 DIAGNOSIS — R5383 Other fatigue: Secondary | ICD-10-CM | POA: Insufficient documentation

## 2021-12-25 DIAGNOSIS — Z923 Personal history of irradiation: Secondary | ICD-10-CM | POA: Insufficient documentation

## 2021-12-25 DIAGNOSIS — Z170 Estrogen receptor positive status [ER+]: Secondary | ICD-10-CM | POA: Insufficient documentation

## 2021-12-25 NOTE — Progress Notes (Signed)
Annette James is here today for follow up post radiation to the pelvic. ? ?They completed their radiation on: 11/21/21  ? ?Does the patient complain of any of the following: ? ?Pain: Patient denies pain ?Abdominal bloating: no ?Diarrhea/Constipation: no ?Nausea/Vomiting: no ?Urinary Issues (dysuria/incomplete emptying/ incontinence/ increased frequency/urgency): no ?Fatigue- yes ?Post radiation skin changes: no ?Appetite- good ?Weight-  ?Wt Readings from Last 3 Encounters:  ?12/25/21 192 lb 6.4 oz (87.3 kg)  ?12/18/21 193 lb 12.8 oz (87.9 kg)  ?11/20/21 193 lb 12.8 oz (87.9 kg)  ?  ? ?Additional comments if applicable: ? ?Vitals:  ? 12/25/21 1041  ?BP: (!) 152/85  ?Pulse: 84  ?Resp: 20  ?Temp: 97.6 ?F (36.4 ?C)  ?SpO2: 100%  ?Weight: 192 lb 6.4 oz (87.3 kg)  ?Height: '5\' 8"'$  (1.727 m)  ?  ?  ?

## 2022-01-01 ENCOUNTER — Other Ambulatory Visit: Payer: Self-pay | Admitting: Hematology & Oncology

## 2022-01-01 DIAGNOSIS — C7951 Secondary malignant neoplasm of bone: Secondary | ICD-10-CM

## 2022-01-02 ENCOUNTER — Other Ambulatory Visit: Payer: Self-pay | Admitting: *Deleted

## 2022-01-02 DIAGNOSIS — C50919 Malignant neoplasm of unspecified site of unspecified female breast: Secondary | ICD-10-CM

## 2022-01-02 MED ORDER — OXYCODONE HCL 5 MG PO TABS: ORAL_TABLET | ORAL | 0 refills | Status: DC | PRN

## 2022-01-09 ENCOUNTER — Other Ambulatory Visit: Payer: Self-pay | Admitting: Hematology & Oncology

## 2022-01-09 DIAGNOSIS — C7951 Secondary malignant neoplasm of bone: Secondary | ICD-10-CM

## 2022-01-09 DIAGNOSIS — C50919 Malignant neoplasm of unspecified site of unspecified female breast: Secondary | ICD-10-CM

## 2022-01-09 MED ORDER — FENTANYL 50 MCG/HR TD PT72: 1.0000 | MEDICATED_PATCH | TRANSDERMAL | 0 refills | Status: DC

## 2022-01-15 ENCOUNTER — Inpatient Hospital Stay (HOSPITAL_BASED_OUTPATIENT_CLINIC_OR_DEPARTMENT_OTHER): Payer: BC Managed Care – PPO | Admitting: Hematology & Oncology

## 2022-01-15 ENCOUNTER — Other Ambulatory Visit (HOSPITAL_BASED_OUTPATIENT_CLINIC_OR_DEPARTMENT_OTHER): Payer: Self-pay

## 2022-01-15 ENCOUNTER — Encounter: Payer: Self-pay | Admitting: Hematology & Oncology

## 2022-01-15 ENCOUNTER — Encounter: Payer: Self-pay | Admitting: *Deleted

## 2022-01-15 ENCOUNTER — Inpatient Hospital Stay: Payer: BC Managed Care – PPO | Attending: Hematology & Oncology

## 2022-01-15 ENCOUNTER — Other Ambulatory Visit: Payer: Self-pay

## 2022-01-15 ENCOUNTER — Inpatient Hospital Stay: Payer: BC Managed Care – PPO

## 2022-01-15 VITALS — BP 131/91 | HR 86 | Temp 98.4°F | Resp 18 | Ht 68.0 in | Wt 194.0 lb

## 2022-01-15 DIAGNOSIS — Z5111 Encounter for antineoplastic chemotherapy: Secondary | ICD-10-CM | POA: Insufficient documentation

## 2022-01-15 DIAGNOSIS — C50919 Malignant neoplasm of unspecified site of unspecified female breast: Secondary | ICD-10-CM | POA: Insufficient documentation

## 2022-01-15 DIAGNOSIS — Z170 Estrogen receptor positive status [ER+]: Secondary | ICD-10-CM | POA: Insufficient documentation

## 2022-01-15 DIAGNOSIS — C7951 Secondary malignant neoplasm of bone: Secondary | ICD-10-CM

## 2022-01-15 DIAGNOSIS — Z79899 Other long term (current) drug therapy: Secondary | ICD-10-CM | POA: Insufficient documentation

## 2022-01-15 DIAGNOSIS — R978 Other abnormal tumor markers: Secondary | ICD-10-CM | POA: Diagnosis not present

## 2022-01-15 DIAGNOSIS — Z1501 Genetic susceptibility to malignant neoplasm of breast: Secondary | ICD-10-CM | POA: Diagnosis not present

## 2022-01-15 LAB — CBC WITH DIFFERENTIAL (CANCER CENTER ONLY)
Abs Immature Granulocytes: 0.02 10*3/uL (ref 0.00–0.07)
Basophils Absolute: 0.1 10*3/uL (ref 0.0–0.1)
Basophils Relative: 3 %
Eosinophils Absolute: 0 10*3/uL (ref 0.0–0.5)
Eosinophils Relative: 1 %
HCT: 34.6 % — ABNORMAL LOW (ref 36.0–46.0)
Hemoglobin: 12.2 g/dL (ref 12.0–15.0)
Immature Granulocytes: 1 %
Lymphocytes Relative: 17 %
Lymphs Abs: 0.5 10*3/uL — ABNORMAL LOW (ref 0.7–4.0)
MCH: 34.7 pg — ABNORMAL HIGH (ref 26.0–34.0)
MCHC: 35.3 g/dL (ref 30.0–36.0)
MCV: 98.3 fL (ref 80.0–100.0)
Monocytes Absolute: 0.2 10*3/uL (ref 0.1–1.0)
Monocytes Relative: 8 %
Neutro Abs: 2.2 10*3/uL (ref 1.7–7.7)
Neutrophils Relative %: 70 %
Platelet Count: 202 10*3/uL (ref 150–400)
RBC: 3.52 MIL/uL — ABNORMAL LOW (ref 3.87–5.11)
RDW: 14 % (ref 11.5–15.5)
WBC Count: 3.2 10*3/uL — ABNORMAL LOW (ref 4.0–10.5)
nRBC: 0 % (ref 0.0–0.2)

## 2022-01-15 LAB — CMP (CANCER CENTER ONLY)
ALT: 11 U/L (ref 0–44)
AST: 22 U/L (ref 15–41)
Albumin: 3.5 g/dL (ref 3.5–5.0)
Alkaline Phosphatase: 120 U/L (ref 38–126)
Anion gap: 9 (ref 5–15)
BUN: 11 mg/dL (ref 6–20)
CO2: 25 mmol/L (ref 22–32)
Calcium: 9 mg/dL (ref 8.9–10.3)
Chloride: 105 mmol/L (ref 98–111)
Creatinine: 0.66 mg/dL (ref 0.44–1.00)
GFR, Estimated: >60 mL/min (ref >60)
Glucose, Bld: 111 mg/dL — ABNORMAL HIGH (ref 70–99)
Potassium: 3.4 mmol/L — ABNORMAL LOW (ref 3.5–5.1)
Sodium: 139 mmol/L (ref 135–145)
Total Bilirubin: 0.4 mg/dL (ref 0.3–1.2)
Total Protein: 7.4 g/dL (ref 6.5–8.1)

## 2022-01-15 LAB — LACTATE DEHYDROGENASE: LDH: 164 U/L (ref 98–192)

## 2022-01-15 MED ORDER — FULVESTRANT 250 MG/5ML IM SOSY
500.0000 mg | PREFILLED_SYRINGE | Freq: Once | INTRAMUSCULAR | Status: AC
  Administered 2022-01-15: 500 mg via INTRAMUSCULAR
  Filled 2022-01-15: qty 10

## 2022-01-15 NOTE — Progress Notes (Signed)
Patient needs a PET prior to her next appointment. PET scheduled for 02/09/22. ? ?Sent patient PET information via MyChart including appointment date, time, location and prep.  ? ?Oncology Nurse Navigator Documentation ? ? ?  01/15/2022  ?  8:45 AM  ?Oncology Nurse Navigator Flowsheets  ?Navigator Follow Up Date: 02/09/2022  ?Navigator Follow Up Reason: Scan Review  ?Navigator Location [facility]  ?Navigator Encounter Type Appt/Treatment Plan Review;MyChart  ?Patient Visit Type MedOnc  ?Treatment Phase Active Tx  ?Barriers/Navigation Needs No Barriers At This Time  ?Education Other  ?Interventions Coordination of Care;Education  ?Acuity Level 2-Minimal Needs (1-2 Barriers Identified)  ?Coordination of Care Radiology  ?Education Method Written  ?Support Groups/Services Friends and Family  ?Time Spent with Patient 30  ?  ?

## 2022-01-15 NOTE — Progress Notes (Signed)
?Hematology and Oncology Follow Up Visit ? ?Annette James ?[id number] ?1961/09/01 61 y.o. ?01/15/2022 ? ? ?Principle Diagnosis:  ?Metastatic breast cancer-bone only metastasis-ER positive/PR positive/HER2 LOW ///  PIK3CA (+) ?  ?Current Therapy:        ?Faslodex 500 mg IM monthly-start on 11/06/2020 ?Ibrance 125 MG PO Q DAY (21 days on/7 days off) ?XRT to the RIGHT hip  --completed on 07/24/2021 ?Zometa 4 mg IV q. 3 months -- next dose on 02/2022 ?  ?Interim History:  [name] is here today for follow-up.  She is doing quite well.  She really has no complaints.  She and her daughter will be going to a local resort with some of the grandchildren in a week or so.  I am sure they will have a wonderful time. ? ?Her last CA 27.29 was up a little bit.  It was 47.  Her last PET scan was done back in December so we probably need to get one set up for her. ? ?She has had no issues with bowels or bladder.  She has had no cough or shortness of breath.  She has had little bit of nausea but no vomiting. ? ?Is been no leg swelling.  She had no rashes.  She has had no bleeding.  There is been no headache. ? ?She is still working quite a bit. ? ?Overall, her performance status is ECOG 0.   ? ?Medications:  ?Allergies as of 01/15/2022   ?No Known Allergies ?  ? ?  ?Medication List  ?  ? ?  ? Accurate as of January 15, 2022  8:22 AM. If you have any questions, ask your nurse or doctor.  ?  ?  ? ?  ? ?ascorbic acid 500 MG tablet ?Commonly known as: VITAMIN C ?Take 500 mg by mouth daily. ?  ?cyclobenzaprine 5 MG tablet ?Commonly known as: FLEXERIL ?TAKE 1 TABLET BY MOUTH THREE TIMES A DAY AS NEEDED FOR MUSCLE SPASMS ?  ?fentaNYL 50 MCG/HR ?Commonly known as: [place] ?Place 1 patch onto the skin every other day. ?  ?ondansetron 8 MG tablet ?Commonly known as: Zofran ?Take 1 tablet (8 mg total) by mouth every 8 (eight) hours as needed for nausea or vomiting. ?  ?One-A-Day Womens 50 Plus Tabs ?Take 1 tablet by mouth daily. ?   ?oxyCODONE 5 MG immediate release tablet ?Commonly known as: Oxy IR/ROXICODONE ?TAKE 1-2 TABLETS BY MOUTH EVERY 4 HOURS AS NEEDED ?  ?palbociclib 125 MG tablet ?Commonly known as: Ibrance ?TAKE 1 TABLET BY MOUTH ONCE DAILY WITH OR WITHOUT FOOD  FOR 21 DAYS, FOLLOWED BY 7  DAYS OFF, REPEATED EVERY 28 DAYS ?  ? ?  ? ? ?Allergies: No Known Allergies ? ?Past Medical History, Surgical history, Social history, and Family History were reviewed and updated. ? ?Review of Systems: ?Review of Systems  ?Constitutional: Negative.   ?HENT: Negative.    ?Eyes: Negative.   ?Respiratory: Negative.    ?Cardiovascular: Negative.   ?Gastrointestinal: Negative.   ?Genitourinary: Negative.   ?Musculoskeletal:  Positive for joint pain and neck pain.  ?Skin: Negative.   ?Neurological: Negative.   ?Endo/Heme/Allergies: Negative.   ?Psychiatric/Behavioral: Negative.    ?  ? ?Physical Exam: ? height is '5\' 8"'  (1.727 m) and weight is 194 lb (88 kg). Her oral temperature is 98.4 ?F (36.9 ?C). Her blood pressure is 131/91 (abnormal) and her pulse is 86. Her respiration is 18 and oxygen saturation is 100%.  ? ?Wt Readings from  Last 3 Encounters:  ?01/15/22 194 lb (88 kg)  ?12/25/21 192 lb 6.4 oz (87.3 kg)  ?12/18/21 193 lb 12.8 oz (87.9 kg)  ? ? ?Physical Exam ?Vitals reviewed.  ?HENT:  ?   Head: Normocephalic and atraumatic.  ?Eyes:  ?   Pupils: Pupils are equal, round, and reactive to light.  ?Cardiovascular:  ?   Rate and Rhythm: Normal rate and regular rhythm.  ?   Heart sounds: Normal heart sounds.  ?Pulmonary:  ?   Effort: Pulmonary effort is normal.  ?   Breath sounds: Normal breath sounds.  ?Abdominal:  ?   General: Bowel sounds are normal.  ?   Palpations: Abdomen is soft.  ?Musculoskeletal:     ?   General: No tenderness or deformity. Normal range of motion.  ?   Cervical back: Normal range of motion.  ?Lymphadenopathy:  ?   Cervical: No cervical adenopathy.  ?Skin: ?   General: Skin is warm and dry.  ?   Findings: No erythema or rash.   ?Neurological:  ?   Mental Status: She is alert and oriented to person, place, and time.  ?Psychiatric:     ?   Behavior: Behavior normal.     ?   Thought Content: Thought content normal.     ?   Judgment: Judgment normal.  ? ? ? ?Lab Results  ?Component Value Date  ? WBC 3.2 (L) 01/15/2022  ? HGB 12.2 01/15/2022  ? HCT 34.6 (L) 01/15/2022  ? MCV 98.3 01/15/2022  ? PLT 202 01/15/2022  ? ?Lab Results  ?Component Value Date  ? FERRITIN 512 (H) 12/04/2020  ? IRON 96 12/04/2020  ? TIBC 244 12/04/2020  ? UIBC 147 12/04/2020  ? IRONPCTSAT 40 12/04/2020  ? ?Lab Results  ?Component Value Date  ? RBC 3.52 (L) 01/15/2022  ? ?Lab Results  ?Component Value Date  ? KAPLAMBRATIO 4.99 10/20/2020  ? ?Lab Results  ?Component Value Date  ? IGGSERUM 714 10/19/2020  ? IGA 153 10/19/2020  ? IGMSERUM 76 10/19/2020  ? ?Lab Results  ?Component Value Date  ? TOTALPROTELP 6.1 10/19/2020  ? ?  Chemistry   ?   ?Component Value Date/Time  ? NA 140 12/18/2021 0819  ? K 4.0 12/18/2021 0819  ? CL 102 12/18/2021 0819  ? CO2 29 12/18/2021 0819  ? BUN 13 12/18/2021 0819  ? CREATININE 0.78 12/18/2021 0819  ?    ?Component Value Date/Time  ? CALCIUM 8.9 12/18/2021 0819  ? ALKPHOS 117 12/18/2021 0819  ? AST 30 12/18/2021 0819  ? ALT 21 12/18/2021 0819  ? BILITOT 0.7 12/18/2021 0819  ?  ? ? ? ?Impression and Plan: [name] is a very pleasant 61 yo caucasian female with metastatic breast cancer confined at this time to her bones.  She was diagnosed back in December 2021. ? ?The PET scan will be very helpful.  Hopefully, we will see that everything is been stable or to be better given that she had the radiation therapy.  If not, then we will have to see about making a change in her protocol.  Her tumor is PIK3CA positive so we can probably use Piqray. ? ?We will plan to get her back in another month.  At that point, she will be due for her Zometa. ? ? ?Volanda Napoleon, MD ?4/6/20238:22 AM ?

## 2022-01-15 NOTE — Patient Instructions (Signed)

## 2022-01-16 LAB — CANCER ANTIGEN 27.29: CA 27.29: 51.4 U/mL — ABNORMAL HIGH (ref 0.0–38.6)

## 2022-02-06 ENCOUNTER — Other Ambulatory Visit: Payer: Self-pay | Admitting: Hematology & Oncology

## 2022-02-06 DIAGNOSIS — C50919 Malignant neoplasm of unspecified site of unspecified female breast: Secondary | ICD-10-CM

## 2022-02-06 DIAGNOSIS — C7951 Secondary malignant neoplasm of bone: Secondary | ICD-10-CM

## 2022-02-06 MED ORDER — FENTANYL 50 MCG/HR TD PT72: 1.0000 | MEDICATED_PATCH | TRANSDERMAL | 0 refills | Status: DC

## 2022-02-06 MED ORDER — OXYCODONE HCL 5 MG PO TABS: ORAL_TABLET | ORAL | 0 refills | Status: DC | PRN

## 2022-02-09 ENCOUNTER — Ambulatory Visit (HOSPITAL_COMMUNITY)
Admission: RE | Admit: 2022-02-09 | Discharge: 2022-02-09 | Disposition: A | Discharge status: Home / Self Care | Payer: BC Managed Care – PPO | Source: Ambulatory Visit | Attending: Hematology & Oncology | Admitting: Hematology & Oncology

## 2022-02-09 DIAGNOSIS — C50919 Malignant neoplasm of unspecified site of unspecified female breast: Secondary | ICD-10-CM | POA: Diagnosis not present

## 2022-02-09 DIAGNOSIS — C7951 Secondary malignant neoplasm of bone: Secondary | ICD-10-CM | POA: Diagnosis not present

## 2022-02-09 LAB — GLUCOSE, CAPILLARY: Glucose-Capillary: 112 mg/dL — ABNORMAL HIGH (ref 70–99)

## 2022-02-09 MED ORDER — FLUDEOXYGLUCOSE F - 18 (FDG) INJECTION
9.6000 | Freq: Once | INTRAVENOUS | Status: AC
Start: 2022-02-09 — End: 2022-02-09
  Administered 2022-02-09: 9.6 via INTRAVENOUS

## 2022-02-11 ENCOUNTER — Other Ambulatory Visit (HOSPITAL_BASED_OUTPATIENT_CLINIC_OR_DEPARTMENT_OTHER): Payer: Self-pay

## 2022-02-11 ENCOUNTER — Encounter: Payer: Self-pay | Admitting: *Deleted

## 2022-02-11 NOTE — Progress Notes (Signed)
Oncology Nurse Navigator Documentation ? ? ?  02/11/2022  ? 12:30 PM  ?Oncology Nurse Navigator Flowsheets  ?Navigator Follow Up Date: 02/12/2022  ?Navigator Follow Up Reason: Follow-up Appointment  ?Navigator Location [facility]  ?Navigator Encounter Type Scan Review  ?Patient Visit Type MedOnc  ?Treatment Phase Active Tx  ?Barriers/Navigation Needs No Barriers At This Time  ?Interventions None Required  ?Acuity Level 2-Minimal Needs (1-2 Barriers Identified)  ?Support Groups/Services Friends and Family  ?Time Spent with Patient 15  ?  ?

## 2022-02-12 ENCOUNTER — Encounter: Payer: Self-pay | Admitting: Hematology & Oncology

## 2022-02-12 ENCOUNTER — Inpatient Hospital Stay (HOSPITAL_BASED_OUTPATIENT_CLINIC_OR_DEPARTMENT_OTHER): Payer: BC Managed Care – PPO | Admitting: Hematology & Oncology

## 2022-02-12 ENCOUNTER — Inpatient Hospital Stay: Payer: BC Managed Care – PPO | Attending: Hematology & Oncology

## 2022-02-12 ENCOUNTER — Encounter: Payer: Self-pay | Admitting: *Deleted

## 2022-02-12 ENCOUNTER — Inpatient Hospital Stay: Payer: BC Managed Care – PPO

## 2022-02-12 ENCOUNTER — Other Ambulatory Visit: Payer: Self-pay

## 2022-02-12 VITALS — BP 127/77 | HR 89 | Temp 98.4°F | Resp 18 | Wt 190.0 lb

## 2022-02-12 DIAGNOSIS — C7951 Secondary malignant neoplasm of bone: Secondary | ICD-10-CM | POA: Insufficient documentation

## 2022-02-12 DIAGNOSIS — C50919 Malignant neoplasm of unspecified site of unspecified female breast: Secondary | ICD-10-CM

## 2022-02-12 DIAGNOSIS — Z170 Estrogen receptor positive status [ER+]: Secondary | ICD-10-CM | POA: Diagnosis not present

## 2022-02-12 DIAGNOSIS — Z5111 Encounter for antineoplastic chemotherapy: Secondary | ICD-10-CM | POA: Diagnosis not present

## 2022-02-12 LAB — CBC WITH DIFFERENTIAL (CANCER CENTER ONLY)
Abs Immature Granulocytes: 0.01 10*3/uL (ref 0.00–0.07)
Basophils Absolute: 0.1 10*3/uL (ref 0.0–0.1)
Basophils Relative: 2 %
Eosinophils Absolute: 0 10*3/uL (ref 0.0–0.5)
Eosinophils Relative: 1 %
HCT: 39.1 % (ref 36.0–46.0)
Hemoglobin: 13.5 g/dL (ref 12.0–15.0)
Immature Granulocytes: 0 %
Lymphocytes Relative: 13 %
Lymphs Abs: 0.5 10*3/uL — ABNORMAL LOW (ref 0.7–4.0)
MCH: 34.3 pg — ABNORMAL HIGH (ref 26.0–34.0)
MCHC: 34.5 g/dL (ref 30.0–36.0)
MCV: 99.2 fL (ref 80.0–100.0)
Monocytes Absolute: 0.3 10*3/uL (ref 0.1–1.0)
Monocytes Relative: 7 %
Neutro Abs: 3.1 10*3/uL (ref 1.7–7.7)
Neutrophils Relative %: 77 %
Platelet Count: 210 10*3/uL (ref 150–400)
RBC: 3.94 MIL/uL (ref 3.87–5.11)
RDW: 14.4 % (ref 11.5–15.5)
WBC Count: 4 10*3/uL (ref 4.0–10.5)
nRBC: 0 % (ref 0.0–0.2)

## 2022-02-12 LAB — CMP (CANCER CENTER ONLY)
ALT: 22 U/L (ref 0–44)
AST: 38 U/L (ref 15–41)
Albumin: 4.6 g/dL (ref 3.5–5.0)
Alkaline Phosphatase: 144 U/L — ABNORMAL HIGH (ref 38–126)
Anion gap: 12 (ref 5–15)
BUN: 13 mg/dL (ref 6–20)
CO2: 28 mmol/L (ref 22–32)
Calcium: 9.8 mg/dL (ref 8.9–10.3)
Chloride: 99 mmol/L (ref 98–111)
Creatinine: 0.71 mg/dL (ref 0.44–1.00)
GFR, Estimated: >60 mL/min (ref >60)
Glucose, Bld: 97 mg/dL (ref 70–99)
Potassium: 3.3 mmol/L — ABNORMAL LOW (ref 3.5–5.1)
Sodium: 139 mmol/L (ref 135–145)
Total Bilirubin: 0.7 mg/dL (ref 0.3–1.2)
Total Protein: 7.2 g/dL (ref 6.5–8.1)

## 2022-02-12 LAB — LACTATE DEHYDROGENASE: LDH: 210 U/L — ABNORMAL HIGH (ref 98–192)

## 2022-02-12 MED ORDER — FULVESTRANT 250 MG/5ML IM SOSY
500.0000 mg | PREFILLED_SYRINGE | Freq: Once | INTRAMUSCULAR | Status: AC
  Administered 2022-02-12: 500 mg via INTRAMUSCULAR

## 2022-02-12 MED ORDER — ZOLEDRONIC ACID 4 MG/100ML IV SOLN
4.0000 mg | Freq: Once | INTRAVENOUS | Status: AC
  Administered 2022-02-12: 4 mg via INTRAVENOUS
  Filled 2022-02-12: qty 100

## 2022-02-12 MED ORDER — SODIUM CHLORIDE 0.9 % IV SOLN: Freq: Once | INTRAVENOUS | Status: AC

## 2022-02-12 NOTE — Patient Instructions (Signed)
[facility]  Discharge Instructions: ?Thank you for choosing [facility] to provide your oncology and hematology care.  ? ?If you have a lab appointment with the [facility], please go directly to the [facility] and check in at the registration area. ? ?Wear comfortable clothing and clothing appropriate for easy access to any Portacath or PICC line.  ? ?We strive to give you quality time with your provider. You may need to reschedule your appointment if you arrive late (15 or more minutes).  Arriving late affects you and other patients whose appointments are after yours.  Also, if you miss three or more appointments without notifying the office, you may be dismissed from the clinic at the provider?s discretion.    ?  ?For prescription refill requests, have your pharmacy contact our office and allow 72 hours for refills to be completed.   ? ?Today you received the following chemotherapy and/or immunotherapy agents zometa, faslodex ?  ?  ?To help prevent nausea and vomiting after your treatment, we encourage you to take your nausea medication as directed. ? ?BELOW ARE SYMPTOMS THAT SHOULD BE REPORTED IMMEDIATELY: ?*FEVER GREATER THAN 100.4 F (38 ?C) OR HIGHER ?*CHILLS OR SWEATING ?*NAUSEA AND VOMITING THAT IS NOT CONTROLLED WITH YOUR NAUSEA MEDICATION ?*UNUSUAL SHORTNESS OF BREATH ?*UNUSUAL BRUISING OR BLEEDING ?*URINARY PROBLEMS (pain or burning when urinating, or frequent urination) ?*BOWEL PROBLEMS (unusual diarrhea, constipation, pain near the anus) ?TENDERNESS IN MOUTH AND THROAT WITH OR WITHOUT PRESENCE OF ULCERS (sore throat, sores in mouth, or a toothache) ?UNUSUAL RASH, SWELLING OR PAIN  ?UNUSUAL VAGINAL DISCHARGE OR ITCHING  ? ?Items with * indicate a potential emergency and should be followed up as soon as possible or go to the Emergency Department if any problems should occur. ? ?Please show the CHEMOTHERAPY ALERT CARD or IMMUNOTHERAPY ALERT CARD at check-in  to the Emergency Department and triage nurse. ?Should you have questions after your visit or need to cancel or reschedule your appointment, please contact [facility]  [phone] and follow the prompts.  Office hours are 8:00 a.m. to 4:30 p.m. Monday - Friday. Please note that voicemails left after 4:00 p.m. may not be returned until the following business day.  We are closed weekends and major holidays. You have access to a nurse at all times for urgent questions. Please call the main number to the clinic [phone] and follow the prompts. ? ?For any non-urgent questions, you may also contact your provider using MyChart. We now offer e-Visits for anyone 57 and older to request care online for non-urgent symptoms. For details visit [email or web address]. ?  ?Also download the MyChart app! Go to the app store, search "MyChart", open the app, select [facility], and log in with your MyChart username and password. ? ?Due to Covid, a mask is required upon entering the hospital/clinic. If you do not have a mask, one will be given to you upon arrival. For doctor visits, patients may have 1 support person aged 38 or older with them. For treatment visits, patients cannot have anyone with them due to current Covid guidelines and our immunocompromised population.  ?

## 2022-02-12 NOTE — Progress Notes (Signed)
Patient here for follow up after recent PET scan. Will remain on current treatment regimen.  ? ?Oncology Nurse Navigator Documentation ? ? ?  02/12/2022  ?  9:00 AM  ?Oncology Nurse Navigator Flowsheets  ?Navigator Follow Up Date: 03/12/2022  ?Navigator Follow Up Reason: Follow-up Appointment  ?Navigator Location [facility]  ?Navigator Encounter Type Appt/Treatment Plan Review  ?Patient Visit Type MedOnc  ?Treatment Phase Active Tx  ?Barriers/Navigation Needs No Barriers At This Time  ?Interventions None Required  ?Acuity Level 2-Minimal Needs (1-2 Barriers Identified)  ?Support Groups/Services Friends and Family  ?Time Spent with Patient 15  ?  ? ? ?

## 2022-02-12 NOTE — Progress Notes (Signed)
?Hematology and Oncology Follow Up Visit ? ?Arpita Fentress ?[id number] ?[birth year] 61 y.o. ?02/12/2022 ? ? ?Principle Diagnosis:  ?Metastatic breast cancer-bone only metastasis-ER positive/PR positive/HER2 LOW ///  PIK3CA (+) ?  ?Current Therapy:        ?Faslodex 500 mg IM monthly-start on 11/06/2020 ?Ibrance 125 MG PO Q DAY (21 days on/7 days off) ?XRT to the RIGHT hip  --completed on 07/24/2021 ?Zometa 4 mg IV q. 3 months -- next dose on 05/2022 ?  ?Interim History:  Ms. Sebring is here today for follow-up.  She is doing quite well.  She really has no complaints since we last saw her. ? ?We did go ahead and do a PET scan on her.  This was done last week.  The PET scan showed that there was some areas of hypermetabolic activity.  However, it did not look like there is anything that looked new. ? ?Her last CA 27.29 was 51.4.  We will have to watch this. ? ?She is working.  She is quite busy at work. ? ?She has had no nausea or vomiting.  There has been no change in bowel or bladder habits.  She has had no cough.  Her appetite has been good. ? ?She has had no leg swelling. ? ?Overall, I would have to say that her performance status is ECOG 1.   ? ?Medications:  ?Allergies as of 02/12/2022   ?No Known Allergies ?  ? ?  ?Medication List  ?  ? ?  ? Accurate as of Feb 12, 2022  4:41 PM. If you have any questions, ask your nurse or doctor.  ?  ?  ? ?  ? ?ascorbic acid 500 MG tablet ?Commonly known as: VITAMIN C ?Take 500 mg by mouth daily. ?  ?cyclobenzaprine 5 MG tablet ?Commonly known as: FLEXERIL ?TAKE 1 TABLET BY MOUTH THREE TIMES A DAY AS NEEDED FOR MUSCLE SPASMS ?  ?fentaNYL 50 MCG/HR ?Commonly known as: [place] ?Place 1 patch onto the skin every other day. ?  ?ondansetron 8 MG tablet ?Commonly known as: Zofran ?Take 1 tablet (8 mg total) by mouth every 8 (eight) hours as needed for nausea or vomiting. ?  ?One-A-Day Womens 50 Plus Tabs ?Take 1 tablet by mouth daily. ?  ?oxyCODONE 5 MG immediate release  tablet ?Commonly known as: Oxy IR/ROXICODONE ?TAKE 1-2 TABLETS BY MOUTH EVERY 4 HOURS AS NEEDED ?  ?palbociclib 125 MG tablet ?Commonly known as: Ibrance ?TAKE 1 TABLET BY MOUTH ONCE DAILY WITH OR WITHOUT FOOD  FOR 21 DAYS, FOLLOWED BY 7  DAYS OFF, REPEATED EVERY 28 DAYS ?  ? ?  ? ? ?Allergies: No Known Allergies ? ?Past Medical History, Surgical history, Social history, and Family History were reviewed and updated. ? ?Review of Systems: ?Review of Systems  ?Constitutional: Negative.   ?HENT: Negative.    ?Eyes: Negative.   ?Respiratory: Negative.    ?Cardiovascular: Negative.   ?Gastrointestinal: Negative.   ?Genitourinary: Negative.   ?Musculoskeletal:  Positive for joint pain and neck pain.  ?Skin: Negative.   ?Neurological: Negative.   ?Endo/Heme/Allergies: Negative.   ?Psychiatric/Behavioral: Negative.    ?  ? ?Physical Exam: ? weight is 190 lb (86.2 kg). Her oral temperature is 98.4 ?F (36.9 ?C). Her blood pressure is 127/77 and her pulse is 89. Her respiration is 18 and oxygen saturation is 97%.  ? ?Wt Readings from Last 3 Encounters:  ?02/12/22 190 lb (86.2 kg)  ?01/15/22 194 lb (88 kg)  ?12/25/21 192 lb  6.4 oz (87.3 kg)  ? ? ?Physical Exam ?Vitals reviewed.  ?HENT:  ?   Head: Normocephalic and atraumatic.  ?Eyes:  ?   Pupils: Pupils are equal, round, and reactive to light.  ?Cardiovascular:  ?   Rate and Rhythm: Normal rate and regular rhythm.  ?   Heart sounds: Normal heart sounds.  ?Pulmonary:  ?   Effort: Pulmonary effort is normal.  ?   Breath sounds: Normal breath sounds.  ?Abdominal:  ?   General: Bowel sounds are normal.  ?   Palpations: Abdomen is soft.  ?Musculoskeletal:     ?   General: No tenderness or deformity. Normal range of motion.  ?   Cervical back: Normal range of motion.  ?Lymphadenopathy:  ?   Cervical: No cervical adenopathy.  ?Skin: ?   General: Skin is warm and dry.  ?   Findings: No erythema or rash.  ?Neurological:  ?   Mental Status: She is alert and oriented to person, place, and  time.  ?Psychiatric:     ?   Behavior: Behavior normal.     ?   Thought Content: Thought content normal.     ?   Judgment: Judgment normal.  ? ? ? ?Lab Results  ?Component Value Date  ? WBC 4.0 02/12/2022  ? HGB 13.5 02/12/2022  ? HCT 39.1 02/12/2022  ? MCV 99.2 02/12/2022  ? PLT 210 02/12/2022  ? ?Lab Results  ?Component Value Date  ? FERRITIN 512 (H) 12/04/2020  ? IRON 96 12/04/2020  ? TIBC 244 12/04/2020  ? UIBC 147 12/04/2020  ? IRONPCTSAT 40 12/04/2020  ? ?Lab Results  ?Component Value Date  ? RBC 3.94 02/12/2022  ? ?Lab Results  ?Component Value Date  ? KAPLAMBRATIO 4.99 10/20/2020  ? ?Lab Results  ?Component Value Date  ? IGGSERUM 714 10/19/2020  ? IGA 153 10/19/2020  ? IGMSERUM 76 10/19/2020  ? ?Lab Results  ?Component Value Date  ? TOTALPROTELP 6.1 10/19/2020  ? ?  Chemistry   ?   ?Component Value Date/Time  ? NA 139 02/12/2022 0821  ? K 3.3 (L) 02/12/2022 1497  ? CL 99 02/12/2022 0821  ? CO2 28 02/12/2022 0821  ? BUN 13 02/12/2022 0821  ? CREATININE 0.71 02/12/2022 0821  ?    ?Component Value Date/Time  ? CALCIUM 9.8 02/12/2022 0821  ? ALKPHOS 144 (H) 02/12/2022 0263  ? AST 38 02/12/2022 0821  ? ALT 22 02/12/2022 0821  ? BILITOT 0.7 02/12/2022 7858  ?  ? ? ? ?Impression and Plan: Ms. Rhodus is a very pleasant 61 yo caucasian female with metastatic breast cancer confined at this time to her bones.  She was diagnosed back in December 2021. ? ?The PET scan does look reassuring.  Hopefully, we will see that the CA 27.29 is stable or going down. ? ?I do not think we have to make any changes with her protocol right now. ? ?She will get her Faslodex and Zometa today. ? ?I am just happy that her quality of life is doing so well.  She really is enjoying her family.  I know she will have a wonderful Mother's Day. ? ?I will plan to see her back in another month. ? ? ? ?Volanda Napoleon, MD ?5/4/20234:41 PM ?

## 2022-02-13 ENCOUNTER — Encounter: Payer: Self-pay | Admitting: *Deleted

## 2022-02-13 ENCOUNTER — Encounter: Payer: Self-pay | Admitting: Hematology & Oncology

## 2022-02-13 LAB — CANCER ANTIGEN 27.29: CA 27.29: 65.9 U/mL — ABNORMAL HIGH (ref 0.0–38.6)

## 2022-03-03 ENCOUNTER — Other Ambulatory Visit: Payer: Self-pay | Admitting: Hematology & Oncology

## 2022-03-03 DIAGNOSIS — C50919 Malignant neoplasm of unspecified site of unspecified female breast: Secondary | ICD-10-CM

## 2022-03-03 DIAGNOSIS — C7951 Secondary malignant neoplasm of bone: Secondary | ICD-10-CM

## 2022-03-04 ENCOUNTER — Encounter: Payer: Self-pay | Admitting: Hematology & Oncology

## 2022-03-04 MED ORDER — OXYCODONE HCL 5 MG PO TABS: ORAL_TABLET | ORAL | 0 refills | Status: DC | PRN

## 2022-03-11 ENCOUNTER — Encounter: Payer: Self-pay | Admitting: *Deleted

## 2022-03-11 ENCOUNTER — Other Ambulatory Visit (HOSPITAL_BASED_OUTPATIENT_CLINIC_OR_DEPARTMENT_OTHER): Payer: Self-pay

## 2022-03-11 DIAGNOSIS — C50919 Malignant neoplasm of unspecified site of unspecified female breast: Secondary | ICD-10-CM

## 2022-03-11 MED ORDER — OXYCODONE HCL 5 MG PO TABS
5.0000 mg | ORAL_TABLET | ORAL | 0 refills | Status: DC | PRN
  Filled 2022-03-11: qty 60, 5d supply, fill #0

## 2022-03-11 NOTE — Telephone Encounter (Signed)
Pt is wanting to send the Oxycodone prescription to [facility] due to the [facility] being [facility]. Cancelled [facility]. Please refill, thanks!

## 2022-03-12 ENCOUNTER — Encounter: Payer: Self-pay | Admitting: *Deleted

## 2022-03-12 ENCOUNTER — Encounter: Payer: Self-pay | Admitting: Hematology & Oncology

## 2022-03-12 ENCOUNTER — Inpatient Hospital Stay: Payer: BC Managed Care – PPO

## 2022-03-12 ENCOUNTER — Inpatient Hospital Stay (HOSPITAL_BASED_OUTPATIENT_CLINIC_OR_DEPARTMENT_OTHER): Payer: BC Managed Care – PPO | Admitting: Hematology & Oncology

## 2022-03-12 ENCOUNTER — Inpatient Hospital Stay: Payer: BC Managed Care – PPO | Attending: Hematology & Oncology

## 2022-03-12 VITALS — BP 151/87 | HR 77 | Temp 98.3°F | Resp 17 | Wt 192.0 lb

## 2022-03-12 DIAGNOSIS — C7951 Secondary malignant neoplasm of bone: Secondary | ICD-10-CM

## 2022-03-12 DIAGNOSIS — C50919 Malignant neoplasm of unspecified site of unspecified female breast: Secondary | ICD-10-CM

## 2022-03-12 DIAGNOSIS — Z5111 Encounter for antineoplastic chemotherapy: Secondary | ICD-10-CM | POA: Diagnosis not present

## 2022-03-12 DIAGNOSIS — R748 Abnormal levels of other serum enzymes: Secondary | ICD-10-CM | POA: Insufficient documentation

## 2022-03-12 DIAGNOSIS — R978 Other abnormal tumor markers: Secondary | ICD-10-CM | POA: Diagnosis not present

## 2022-03-12 DIAGNOSIS — Z170 Estrogen receptor positive status [ER+]: Secondary | ICD-10-CM | POA: Diagnosis not present

## 2022-03-12 LAB — CMP (CANCER CENTER ONLY)
ALT: 7 U/L (ref 0–44)
AST: 22 U/L (ref 15–41)
Albumin: 4.2 g/dL (ref 3.5–5.0)
Alkaline Phosphatase: 92 U/L (ref 38–126)
Anion gap: 9 (ref 5–15)
BUN: 14 mg/dL (ref 6–20)
CO2: 26 mmol/L (ref 22–32)
Calcium: 9.8 mg/dL (ref 8.9–10.3)
Chloride: 104 mmol/L (ref 98–111)
Creatinine: 0.69 mg/dL (ref 0.44–1.00)
GFR, Estimated: >60 mL/min (ref >60)
Glucose, Bld: 95 mg/dL (ref 70–99)
Potassium: 3.9 mmol/L (ref 3.5–5.1)
Sodium: 139 mmol/L (ref 135–145)
Total Bilirubin: 0.4 mg/dL (ref 0.3–1.2)
Total Protein: 7.1 g/dL (ref 6.5–8.1)

## 2022-03-12 LAB — CBC WITH DIFFERENTIAL (CANCER CENTER ONLY)
Abs Immature Granulocytes: 0.02 10*3/uL (ref 0.00–0.07)
Basophils Absolute: 0.1 10*3/uL (ref 0.0–0.1)
Basophils Relative: 3 %
Eosinophils Absolute: 0.1 10*3/uL (ref 0.0–0.5)
Eosinophils Relative: 2 %
HCT: 33.3 % — ABNORMAL LOW (ref 36.0–46.0)
Hemoglobin: 11.5 g/dL — ABNORMAL LOW (ref 12.0–15.0)
Immature Granulocytes: 1 %
Lymphocytes Relative: 23 %
Lymphs Abs: 0.7 10*3/uL (ref 0.7–4.0)
MCH: 33.8 pg (ref 26.0–34.0)
MCHC: 34.5 g/dL (ref 30.0–36.0)
MCV: 97.9 fL (ref 80.0–100.0)
Monocytes Absolute: 0.3 10*3/uL (ref 0.1–1.0)
Monocytes Relative: 9 %
Neutro Abs: 1.8 10*3/uL (ref 1.7–7.7)
Neutrophils Relative %: 62 %
Platelet Count: 176 10*3/uL (ref 150–400)
RBC: 3.4 MIL/uL — ABNORMAL LOW (ref 3.87–5.11)
RDW: 14.8 % (ref 11.5–15.5)
WBC Count: 2.8 10*3/uL — ABNORMAL LOW (ref 4.0–10.5)
nRBC: 0 % (ref 0.0–0.2)

## 2022-03-12 MED ORDER — FULVESTRANT 250 MG/5ML IM SOSY
500.0000 mg | PREFILLED_SYRINGE | Freq: Once | INTRAMUSCULAR | Status: AC
  Administered 2022-03-12: 500 mg via INTRAMUSCULAR
  Filled 2022-03-12: qty 10

## 2022-03-12 NOTE — Patient Instructions (Signed)
Fulvestrant injection What is this medication? FULVESTRANT (ful VES trant) blocks the effects of estrogen. It is used to treat breast cancer. This medicine may be used for other purposes; ask your health care provider or pharmacist if you have questions. COMMON BRAND NAME(S): FASLODEX What should I tell my care team before I take this medication? They need to know if you have any of these conditions: bleeding disorders liver disease low blood counts, like low white cell, platelet, or red cell counts an unusual or allergic reaction to fulvestrant, other medicines, foods, dyes, or preservatives pregnant or trying to get pregnant breast-feeding How should I use this medication? This medicine is for injection into a muscle. It is usually given by a health care professional in a hospital or clinic setting. Talk to your pediatrician regarding the use of this medicine in children. Special care may be needed. Overdosage: If you think you have taken too much of this medicine contact a poison control center or emergency room at once. NOTE: This medicine is only for you. Do not share this medicine with others. What if I miss a dose? It is important not to miss your dose. Call your doctor or health care professional if you are unable to keep an appointment. What may interact with this medication? medicines that treat or prevent blood clots like warfarin, enoxaparin, dalteparin, apixaban, dabigatran, and rivaroxaban This list may not describe all possible interactions. Give your health care provider a list of all the medicines, herbs, non-prescription drugs, or dietary supplements you use. Also tell them if you smoke, drink alcohol, or use illegal drugs. Some items may interact with your medicine. What should I watch for while using this medication? Your condition will be monitored carefully while you are receiving this medicine. You will need important blood work done while you are taking this  medicine. Do not become pregnant while taking this medicine or for at least 1 year after stopping it. Women of child-bearing potential will need to have a negative pregnancy test before starting this medicine. Women should inform their doctor if they wish to become pregnant or think they might be pregnant. There is a potential for serious side effects to an unborn child. Men should inform their doctors if they wish to father a child. This medicine may lower sperm counts. Talk to your health care professional or pharmacist for more information. Do not breast-feed an infant while taking this medicine or for 1 year after the last dose. What side effects may I notice from receiving this medication? Side effects that you should report to your doctor or health care professional as soon as possible: allergic reactions like skin rash, itching or hives, swelling of the face, lips, or tongue feeling faint or lightheaded, falls pain, tingling, numbness, or weakness in the legs signs and symptoms of infection like fever or chills; cough; flu-like symptoms; sore throat vaginal bleeding Side effects that usually do not require medical attention (report to your doctor or health care professional if they continue or are bothersome): aches, pains constipation diarrhea headache hot flashes nausea, vomiting pain at site where injected stomach pain This list may not describe all possible side effects. Call your doctor for medical advice about side effects. You may report side effects to FDA at 1-800-FDA-1088. Where should I keep my medication? This drug is given in a hospital or clinic and will not be stored at home. NOTE: This sheet is a summary. It may not cover all possible information. If you have   questions about this medicine, talk to your doctor, pharmacist, or health care provider.  2023 Elsevier/Gold Standard (2018-01-11 00:00:00)  

## 2022-03-12 NOTE — Progress Notes (Signed)
Hematology and Oncology Follow Up Visit  Annette James [id number] 01/10/1961 61 y.o. 03/12/2022   Principle Diagnosis:  Metastatic breast cancer-bone only metastasis-ER positive/PR positive/HER2 LOW ///  PIK3CA (+)   Current Therapy:        Faslodex 500 mg IM monthly-start on 11/06/2020 Ibrance 125 MG PO Q DAY (21 days on/7 days off) XRT to the RIGHT hip  --completed on 07/24/2021 Zometa 4 mg IV q. 3 months -- next dose on 05/2022   Interim History:  Annette James is here today for follow-up.  She does look fantastic.  As always, I am incredibly impressed with her resilience.  She is incredibly tough.  She is doing well with the Faslodex and Ibrance.  Her blood counts are little bit down today which I suspect was from the [place].  She had a slight rise in her CA 27.29.  When we last saw her, it was up to 70.  We will have to see what it is this time.  We can always make a change to a [place] if we needed to given the fact that her tumor is positive for the PIK3CA mutation.  She has had no problems with pain.  She has had no cough or shortness of breath.  There is been no change in bowel or bladder habits.  She has had no leg swelling.  She has had no rashes.  She has had no headache.  Overall, I would have to say that her performance status is ECOG 0.     Medications:  Allergies as of 03/12/2022   No Known Allergies      Medication List        Accurate as of March 12, 2022 12:46 PM. If you have any questions, ask your nurse or doctor.          ascorbic acid 500 MG tablet Commonly known as: VITAMIN C Take 500 mg by mouth daily.   cyclobenzaprine 5 MG tablet Commonly known as: FLEXERIL TAKE 1 TABLET BY MOUTH THREE TIMES A DAY AS NEEDED FOR MUSCLE SPASMS   fentaNYL 50 MCG/HR Commonly known as: [place] 1 patch onto the skin every other day.   ondansetron 8 MG tablet Commonly known as: Zofran Take 1 tablet (8 mg total) by mouth every 8 (eight) hours as  needed for nausea or vomiting.   One-A-Day Womens 50 Plus Tabs Take 1 tablet by mouth daily.   oxyCODONE 5 MG immediate release tablet Commonly known as: Oxy IR/ROXICODONE Take 1-2 tablets (5-10 mg total) by mouth every 4 (four) hours as needed.   palbociclib 125 MG tablet Commonly known as: Ibrance TAKE 1 TABLET BY MOUTH ONCE DAILY WITH OR WITHOUT FOOD  FOR 21 DAYS, FOLLOWED BY 7  DAYS OFF, REPEATED EVERY 28 DAYS        Allergies: No Known Allergies  Past Medical History, Surgical history, Social history, and Family History were reviewed and updated.  Review of Systems: Review of Systems  Constitutional: Negative.   HENT: Negative.    Eyes: Negative.   Respiratory: Negative.    Cardiovascular: Negative.   Gastrointestinal: Negative.   Genitourinary: Negative.   Musculoskeletal:  Positive for joint pain and neck pain.  Skin: Negative.   Neurological: Negative.   Endo/Heme/Allergies: Negative.   Psychiatric/Behavioral: Negative.       Physical Exam:  weight is 192 lb 0.6 oz (87.1 kg). Her oral temperature is 98.3 F (36.8 C). Her blood pressure is 151/87 (abnormal) and her pulse  is 77. Her respiration is 17 and oxygen saturation is 99%.   Wt Readings from Last 3 Encounters:  03/12/22 192 lb 0.6 oz (87.1 kg)  02/12/22 190 lb (86.2 kg)  01/15/22 194 lb (88 kg)    Physical Exam Vitals reviewed.  HENT:     Head: Normocephalic and atraumatic.  Eyes:     Pupils: Pupils are equal, round, and reactive to light.  Cardiovascular:     Rate and Rhythm: Normal rate and regular rhythm.     Heart sounds: Normal heart sounds.  Pulmonary:     Effort: Pulmonary effort is normal.     Breath sounds: Normal breath sounds.  Abdominal:     General: Bowel sounds are normal.     Palpations: Abdomen is soft.  Musculoskeletal:        General: No tenderness or deformity. Normal range of motion.     Cervical back: Normal range of motion.  Lymphadenopathy:     Cervical: No cervical  adenopathy.  Skin:    General: Skin is warm and dry.     Findings: No erythema or rash.  Neurological:     Mental Status: She is alert and oriented to person, place, and time.  Psychiatric:        Behavior: Behavior normal.        Thought Content: Thought content normal.        Judgment: Judgment normal.     Lab Results  Component Value Date   WBC 2.8 (L) 03/12/2022   HGB 11.5 (L) 03/12/2022   HCT 33.3 (L) 03/12/2022   MCV 97.9 03/12/2022   PLT 176 03/12/2022   Lab Results  Component Value Date   FERRITIN 512 (H) 12/04/2020   IRON 96 12/04/2020   TIBC 244 12/04/2020   UIBC 147 12/04/2020   IRONPCTSAT 40 12/04/2020   Lab Results  Component Value Date   RBC 3.40 (L) 03/12/2022   Lab Results  Component Value Date   KAPLAMBRATIO 4.99 10/20/2020   Lab Results  Component Value Date   IGGSERUM 714 10/19/2020   IGA 153 10/19/2020   IGMSERUM 76 10/19/2020   Lab Results  Component Value Date   TOTALPROTELP 6.1 10/19/2020     Chemistry      Component Value Date/Time   NA 139 03/12/2022 1134   K 3.9 03/12/2022 1134   CL 104 03/12/2022 1134   CO2 26 03/12/2022 1134   BUN 14 03/12/2022 1134   CREATININE 0.69 03/12/2022 1134      Component Value Date/Time   CALCIUM 9.8 03/12/2022 1134   ALKPHOS 92 03/12/2022 1134   AST 22 03/12/2022 1134   ALT 7 03/12/2022 1134   BILITOT 0.4 03/12/2022 1134       Impression and Plan: Annette James is a very pleasant 61 yo caucasian female with metastatic breast cancer confined at this time to her bones.  She was diagnosed back in December 2021.  It would be very interesting to see what the CA 27.29 is.  Hopefully, it will be a little bit better.  The alkaline phosphatase is down a little bit so maybe this is a good indicator for [place].  Again I am glad that her quality life is doing so well.  She is still working.  She won a very important award at work.  I am not surprised by this.  She and her family will be going up to [place] the first weekend in July for a nephew's graduation.  I would like to see her back in 1 month.  I do not think she is due for Zometa until August.  If we find that the CA 27.29 is higher, then we may have to set her up with a PET scan to see what is going on.   Volanda Napoleon, MD 6/1/202312:46 PM

## 2022-03-13 ENCOUNTER — Encounter: Payer: Self-pay | Admitting: Hematology & Oncology

## 2022-03-13 ENCOUNTER — Other Ambulatory Visit: Payer: Self-pay | Admitting: Hematology & Oncology

## 2022-03-13 ENCOUNTER — Encounter: Payer: Self-pay | Admitting: *Deleted

## 2022-03-13 DIAGNOSIS — C50919 Malignant neoplasm of unspecified site of unspecified female breast: Secondary | ICD-10-CM

## 2022-03-13 DIAGNOSIS — C7951 Secondary malignant neoplasm of bone: Secondary | ICD-10-CM

## 2022-03-13 LAB — CANCER ANTIGEN 27.29: CA 27.29: 66.4 U/mL — ABNORMAL HIGH (ref 0.0–38.6)

## 2022-03-13 MED ORDER — FENTANYL 50 MCG/HR TD PT72: 1.0000 | MEDICATED_PATCH | TRANSDERMAL | 0 refills | Status: DC

## 2022-03-13 NOTE — Progress Notes (Signed)
Oncology Nurse Navigator Documentation     03/12/2022    1:15 PM  Oncology Nurse Navigator Flowsheets  Navigator Follow Up Date: 04/08/2022  Navigator Follow Up Reason: Follow-up Appointment  Navigator Location [facility]  Navigator Encounter Type Appt/Treatment Plan Review  Patient Visit Type MedOnc  Treatment Phase Active Tx  Barriers/Navigation Needs No Barriers At This Time  Interventions None Required  Acuity Level 2-Minimal Needs (1-2 Barriers Identified)  Support Groups/Services Friends and Family  Time Spent with Patient 15

## 2022-04-04 ENCOUNTER — Other Ambulatory Visit: Payer: Self-pay | Admitting: Hematology & Oncology

## 2022-04-04 DIAGNOSIS — C50919 Malignant neoplasm of unspecified site of unspecified female breast: Secondary | ICD-10-CM

## 2022-04-05 ENCOUNTER — Encounter: Payer: Self-pay | Admitting: Hematology & Oncology

## 2022-04-05 MED ORDER — OXYCODONE HCL 5 MG PO TABS
5.0000 mg | ORAL_TABLET | ORAL | 0 refills | Status: DC | PRN
  Filled 2022-04-05: qty 60, 5d supply, fill #0

## 2022-04-06 ENCOUNTER — Other Ambulatory Visit (HOSPITAL_BASED_OUTPATIENT_CLINIC_OR_DEPARTMENT_OTHER): Payer: Self-pay

## 2022-04-08 ENCOUNTER — Inpatient Hospital Stay: Payer: BC Managed Care – PPO

## 2022-04-08 ENCOUNTER — Other Ambulatory Visit: Payer: Self-pay

## 2022-04-08 ENCOUNTER — Other Ambulatory Visit (HOSPITAL_BASED_OUTPATIENT_CLINIC_OR_DEPARTMENT_OTHER): Payer: Self-pay

## 2022-04-08 ENCOUNTER — Inpatient Hospital Stay (HOSPITAL_BASED_OUTPATIENT_CLINIC_OR_DEPARTMENT_OTHER): Payer: BC Managed Care – PPO | Admitting: Hematology & Oncology

## 2022-04-08 ENCOUNTER — Encounter: Payer: Self-pay | Admitting: Hematology & Oncology

## 2022-04-08 ENCOUNTER — Encounter: Payer: Self-pay | Admitting: *Deleted

## 2022-04-08 VITALS — BP 147/88 | HR 84 | Temp 98.5°F | Resp 16 | Wt 192.0 lb

## 2022-04-08 DIAGNOSIS — C50919 Malignant neoplasm of unspecified site of unspecified female breast: Secondary | ICD-10-CM

## 2022-04-08 DIAGNOSIS — Z5111 Encounter for antineoplastic chemotherapy: Secondary | ICD-10-CM | POA: Diagnosis not present

## 2022-04-08 DIAGNOSIS — C7951 Secondary malignant neoplasm of bone: Secondary | ICD-10-CM

## 2022-04-08 DIAGNOSIS — R748 Abnormal levels of other serum enzymes: Secondary | ICD-10-CM | POA: Diagnosis not present

## 2022-04-08 DIAGNOSIS — R978 Other abnormal tumor markers: Secondary | ICD-10-CM | POA: Diagnosis not present

## 2022-04-08 DIAGNOSIS — Z170 Estrogen receptor positive status [ER+]: Secondary | ICD-10-CM | POA: Diagnosis not present

## 2022-04-08 LAB — CMP (CANCER CENTER ONLY)
ALT: 8 U/L (ref 0–44)
AST: 26 U/L (ref 15–41)
Albumin: 4.1 g/dL (ref 3.5–5.0)
Alkaline Phosphatase: 99 U/L (ref 38–126)
Anion gap: 10 (ref 5–15)
BUN: 10 mg/dL (ref 6–20)
CO2: 28 mmol/L (ref 22–32)
Calcium: 9.8 mg/dL (ref 8.9–10.3)
Chloride: 103 mmol/L (ref 98–111)
Creatinine: 0.71 mg/dL (ref 0.44–1.00)
GFR, Estimated: >60 mL/min (ref >60)
Glucose, Bld: 117 mg/dL — ABNORMAL HIGH (ref 70–99)
Potassium: 4 mmol/L (ref 3.5–5.1)
Sodium: 141 mmol/L (ref 135–145)
Total Bilirubin: 0.8 mg/dL (ref 0.3–1.2)
Total Protein: 6.9 g/dL (ref 6.5–8.1)

## 2022-04-08 LAB — CBC WITH DIFFERENTIAL (CANCER CENTER ONLY)
Abs Immature Granulocytes: 0.03 10*3/uL (ref 0.00–0.07)
Basophils Absolute: 0.1 10*3/uL (ref 0.0–0.1)
Basophils Relative: 2 %
Eosinophils Absolute: 0 10*3/uL (ref 0.0–0.5)
Eosinophils Relative: 1 %
HCT: 32 % — ABNORMAL LOW (ref 36.0–46.0)
Hemoglobin: 11.1 g/dL — ABNORMAL LOW (ref 12.0–15.0)
Immature Granulocytes: 1 %
Lymphocytes Relative: 13 %
Lymphs Abs: 0.4 10*3/uL — ABNORMAL LOW (ref 0.7–4.0)
MCH: 34.4 pg — ABNORMAL HIGH (ref 26.0–34.0)
MCHC: 34.7 g/dL (ref 30.0–36.0)
MCV: 99.1 fL (ref 80.0–100.0)
Monocytes Absolute: 0.4 10*3/uL (ref 0.1–1.0)
Monocytes Relative: 14 %
Neutro Abs: 2.2 10*3/uL (ref 1.7–7.7)
Neutrophils Relative %: 69 %
Platelet Count: 190 10*3/uL (ref 150–400)
RBC: 3.23 MIL/uL — ABNORMAL LOW (ref 3.87–5.11)
RDW: 15 % (ref 11.5–15.5)
WBC Count: 3.1 10*3/uL — ABNORMAL LOW (ref 4.0–10.5)
nRBC: 0.6 % — ABNORMAL HIGH (ref 0.0–0.2)

## 2022-04-08 LAB — LACTATE DEHYDROGENASE: LDH: 215 U/L — ABNORMAL HIGH (ref 98–192)

## 2022-04-08 MED ORDER — FULVESTRANT 250 MG/5ML IM SOSY
500.0000 mg | PREFILLED_SYRINGE | Freq: Once | INTRAMUSCULAR | Status: AC
  Administered 2022-04-08: 500 mg via INTRAMUSCULAR
  Filled 2022-04-08: qty 10

## 2022-04-08 NOTE — Patient Instructions (Signed)
Fulvestrant injection What is this medication? FULVESTRANT (ful VES trant) blocks the effects of estrogen. It is used to treat breast cancer. This medicine may be used for other purposes; ask your health care provider or pharmacist if you have questions. COMMON BRAND NAME(S): FASLODEX What should I tell my care team before I take this medication? They need to know if you have any of these conditions: bleeding disorders liver disease low blood counts, like low white cell, platelet, or red cell counts an unusual or allergic reaction to fulvestrant, other medicines, foods, dyes, or preservatives pregnant or trying to get pregnant breast-feeding How should I use this medication? This medicine is for injection into a muscle. It is usually given by a health care professional in a hospital or clinic setting. Talk to your pediatrician regarding the use of this medicine in children. Special care may be needed. Overdosage: If you think you have taken too much of this medicine contact a poison control center or emergency room at once. NOTE: This medicine is only for you. Do not share this medicine with others. What if I miss a dose? It is important not to miss your dose. Call your doctor or health care professional if you are unable to keep an appointment. What may interact with this medication? medicines that treat or prevent blood clots like warfarin, enoxaparin, dalteparin, apixaban, dabigatran, and rivaroxaban This list may not describe all possible interactions. Give your health care provider a list of all the medicines, herbs, non-prescription drugs, or dietary supplements you use. Also tell them if you smoke, drink alcohol, or use illegal drugs. Some items may interact with your medicine. What should I watch for while using this medication? Your condition will be monitored carefully while you are receiving this medicine. You will need important blood work done while you are taking this  medicine. Do not become pregnant while taking this medicine or for at least 1 year after stopping it. Women of child-bearing potential will need to have a negative pregnancy test before starting this medicine. Women should inform their doctor if they wish to become pregnant or think they might be pregnant. There is a potential for serious side effects to an unborn child. Men should inform their doctors if they wish to father a child. This medicine may lower sperm counts. Talk to your health care professional or pharmacist for more information. Do not breast-feed an infant while taking this medicine or for 1 year after the last dose. What side effects may I notice from receiving this medication? Side effects that you should report to your doctor or health care professional as soon as possible: allergic reactions like skin rash, itching or hives, swelling of the face, lips, or tongue feeling faint or lightheaded, falls pain, tingling, numbness, or weakness in the legs signs and symptoms of infection like fever or chills; cough; flu-like symptoms; sore throat vaginal bleeding Side effects that usually do not require medical attention (report to your doctor or health care professional if they continue or are bothersome): aches, pains constipation diarrhea headache hot flashes nausea, vomiting pain at site where injected stomach pain This list may not describe all possible side effects. Call your doctor for medical advice about side effects. You may report side effects to FDA at 1-800-FDA-1088. Where should I keep my medication? This drug is given in a hospital or clinic and will not be stored at home. NOTE: This sheet is a summary. It may not cover all possible information. If you have   questions about this medicine, talk to your doctor, pharmacist, or health care provider.  2023 Elsevier/Gold Standard (2018-01-11 00:00:00)  

## 2022-04-08 NOTE — Progress Notes (Signed)
Hematology and Oncology Follow Up Visit  Annette James [id number] 1961/04/03 61 y.o. 04/08/2022   Principle Diagnosis:  Metastatic breast cancer-bone only metastasis-ER positive/PR positive/HER2 LOW ///  PIK3CA (+)   Current Therapy:        Faslodex 500 mg IM monthly-start on 11/06/2020 Ibrance 125 MG PO Q DAY (21 days on/7 days off) XRT to the RIGHT hip  --completed on 07/24/2021 Zometa 4 mg IV q. 3 months -- next dose on 05/2022   Interim History:  Annette James is here today for follow-up.  She is getting ready to go up to [place].  She and her daughter and 2 grandchildren are headed up to [place].  They are leaving tomorrow.  Her last CA 27.29 was holding steady at 66.  She does not complain of any pain.  There is been no problems with nausea or vomiting.  She has had no change in bowel or bladder habits.  There is been no bleeding.  I have noted that her hemoglobin is slowly dropping.  We probably will have to check iron studies on her when we see her back.  She has had no rashes.  There is been no leg swelling.  There is no fever.  She has had no headache.  Overall, I was a performance status is probably ECOG 1.   Medications:  Allergies as of 04/08/2022   No Known Allergies      Medication List        Accurate as of April 08, 2022  8:11 AM. If you have any questions, ask your nurse or doctor.          ascorbic acid 500 MG tablet Commonly known as: VITAMIN C Take 500 mg by mouth daily.   cyclobenzaprine 5 MG tablet Commonly known as: FLEXERIL TAKE 1 TABLET BY MOUTH THREE TIMES A DAY AS NEEDED FOR MUSCLE SPASMS   fentaNYL 50 MCG/HR Commonly known as: Annette James 1 patch onto the skin every other day.   ondansetron 8 MG tablet Commonly known as: Zofran Take 1 tablet (8 mg total) by mouth every 8 (eight) hours as needed for nausea or vomiting.   One-A-Day Womens 50 Plus Tabs Take 1 tablet by mouth daily.   oxyCODONE 5 MG immediate  release tablet Commonly known as: Oxy IR/ROXICODONE Take 1-2 tablets (5-10 mg total) by mouth every 4 (four) hours as needed.   palbociclib 125 MG tablet Commonly known as: Ibrance TAKE 1 TABLET BY MOUTH ONCE DAILY WITH OR WITHOUT FOOD  FOR 21 DAYS, FOLLOWED BY 7  DAYS OFF, REPEATED EVERY 28 DAYS        Allergies: No Known Allergies  Past Medical History, Surgical history, Social history, and Family History were reviewed and updated.  Review of Systems: Review of Systems  Constitutional: Negative.   HENT: Negative.    Eyes: Negative.   Respiratory: Negative.    Cardiovascular: Negative.   Gastrointestinal: Negative.   Genitourinary: Negative.   Musculoskeletal:  Positive for joint pain and neck pain.  Skin: Negative.   Neurological: Negative.   Endo/Heme/Allergies: Negative.   Psychiatric/Behavioral: Negative.        Physical Exam:  vitals were not taken for this visit.   Wt Readings from Last 3 Encounters:  03/12/22 192 lb 0.6 oz (87.1 kg)  02/12/22 190 lb (86.2 kg)  01/15/22 194 lb (88 kg)    Physical Exam Vitals reviewed.  HENT:     Head: Normocephalic and atraumatic.  Eyes:  Pupils: Pupils are equal, round, and reactive to light.  Cardiovascular:     Rate and Rhythm: Normal rate and regular rhythm.     Heart sounds: Normal heart sounds.  Pulmonary:     Effort: Pulmonary effort is normal.     Breath sounds: Normal breath sounds.  Abdominal:     General: Bowel sounds are normal.     Palpations: Abdomen is soft.  Musculoskeletal:        General: No tenderness or deformity. Normal range of motion.     Cervical back: Normal range of motion.  Lymphadenopathy:     Cervical: No cervical adenopathy.  Skin:    General: Skin is warm and dry.     Findings: No erythema or rash.  Neurological:     Mental Status: She is alert and oriented to person, place, and time.  Psychiatric:        Behavior: Behavior normal.        Thought Content: Thought content  normal.        Judgment: Judgment normal.      Lab Results  Component Value Date   WBC 3.1 (L) 04/08/2022   HGB 11.1 (L) 04/08/2022   HCT 32.0 (L) 04/08/2022   MCV 99.1 04/08/2022   PLT 190 04/08/2022   Lab Results  Component Value Date   FERRITIN 512 (H) 12/04/2020   IRON 96 12/04/2020   TIBC 244 12/04/2020   UIBC 147 12/04/2020   IRONPCTSAT 40 12/04/2020   Lab Results  Component Value Date   RBC 3.23 (L) 04/08/2022   Lab Results  Component Value Date   KAPLAMBRATIO 4.99 10/20/2020   Lab Results  Component Value Date   IGGSERUM 714 10/19/2020   IGA 153 10/19/2020   IGMSERUM 76 10/19/2020   Lab Results  Component Value Date   TOTALPROTELP 6.1 10/19/2020     Chemistry      Component Value Date/Time   NA 139 03/12/2022 1134   K 3.9 03/12/2022 1134   CL 104 03/12/2022 1134   CO2 26 03/12/2022 1134   BUN 14 03/12/2022 1134   CREATININE 0.69 03/12/2022 1134      Component Value Date/Time   CALCIUM 9.8 03/12/2022 1134   ALKPHOS 92 03/12/2022 1134   AST 22 03/12/2022 1134   ALT 7 03/12/2022 1134   BILITOT 0.4 03/12/2022 1134       Impression and Plan: Annette James is a very pleasant 61 yo caucasian female with metastatic breast cancer confined at this time to her bones.  She was diagnosed back in December 2021.  Her last PET scan was done back in early May.  I do not think we need another PET scan probably until August or so.  She will get the Faslodex today.  We will see what her CA 27.29 looks like.  Again, she little bit more anemic.  We will have to follow-up on this.  As always, we will get her back in 1 more month.   Volanda Napoleon, MD 6/28/20238:11 AM

## 2022-04-08 NOTE — Progress Notes (Signed)
Oncology Nurse Navigator Documentation     04/08/2022    8:30 AM  Oncology Nurse Navigator Flowsheets  Navigator Follow Up Date: 05/13/2022  Navigator Follow Up Reason: Follow-up Appointment  Navigator Location [facility]  Navigator Encounter Type Appt/Treatment Plan Review  Patient Visit Type MedOnc  Treatment Phase Active Tx  Barriers/Navigation Needs No Barriers At This Time  Interventions None Required  Acuity Level 1-No Barriers  Support Groups/Services Friends and Family  Time Spent with Patient 15

## 2022-04-09 ENCOUNTER — Encounter: Payer: Self-pay | Admitting: *Deleted

## 2022-04-09 LAB — CANCER ANTIGEN 27.29: CA 27.29: 65 U/mL — ABNORMAL HIGH (ref 0.0–38.6)

## 2022-04-15 ENCOUNTER — Other Ambulatory Visit: Payer: Self-pay | Admitting: Hematology & Oncology

## 2022-04-15 DIAGNOSIS — C50919 Malignant neoplasm of unspecified site of unspecified female breast: Secondary | ICD-10-CM

## 2022-04-15 DIAGNOSIS — C7951 Secondary malignant neoplasm of bone: Secondary | ICD-10-CM

## 2022-04-15 MED ORDER — FENTANYL 50 MCG/HR TD PT72: 1.0000 | MEDICATED_PATCH | TRANSDERMAL | 0 refills | Status: DC

## 2022-04-24 ENCOUNTER — Other Ambulatory Visit: Payer: Self-pay | Admitting: Hematology & Oncology

## 2022-04-24 DIAGNOSIS — C50919 Malignant neoplasm of unspecified site of unspecified female breast: Secondary | ICD-10-CM

## 2022-04-27 ENCOUNTER — Other Ambulatory Visit: Payer: Self-pay | Admitting: Hematology & Oncology

## 2022-04-27 DIAGNOSIS — C50919 Malignant neoplasm of unspecified site of unspecified female breast: Secondary | ICD-10-CM

## 2022-05-09 ENCOUNTER — Other Ambulatory Visit: Payer: Self-pay | Admitting: Hematology & Oncology

## 2022-05-09 DIAGNOSIS — C50919 Malignant neoplasm of unspecified site of unspecified female breast: Secondary | ICD-10-CM

## 2022-05-09 MED ORDER — OXYCODONE HCL 5 MG PO TABS
5.0000 mg | ORAL_TABLET | ORAL | 0 refills | Status: DC | PRN
  Filled 2022-05-09: qty 60, 5d supply, fill #0

## 2022-05-10 ENCOUNTER — Other Ambulatory Visit (HOSPITAL_BASED_OUTPATIENT_CLINIC_OR_DEPARTMENT_OTHER): Payer: Self-pay

## 2022-05-11 ENCOUNTER — Other Ambulatory Visit (HOSPITAL_BASED_OUTPATIENT_CLINIC_OR_DEPARTMENT_OTHER): Payer: Self-pay

## 2022-05-12 ENCOUNTER — Other Ambulatory Visit: Payer: Self-pay | Admitting: Hematology & Oncology

## 2022-05-12 DIAGNOSIS — C50919 Malignant neoplasm of unspecified site of unspecified female breast: Secondary | ICD-10-CM

## 2022-05-12 DIAGNOSIS — C7951 Secondary malignant neoplasm of bone: Secondary | ICD-10-CM

## 2022-05-12 MED ORDER — FENTANYL 50 MCG/HR TD PT72: 1.0000 | MEDICATED_PATCH | TRANSDERMAL | 0 refills | Status: DC

## 2022-05-12 NOTE — Telephone Encounter (Signed)
Last refilled 04/15/22. Please advise for approval. Thanks!

## 2022-05-13 ENCOUNTER — Inpatient Hospital Stay: Payer: BC Managed Care – PPO | Attending: Hematology & Oncology

## 2022-05-13 ENCOUNTER — Inpatient Hospital Stay: Payer: BC Managed Care – PPO

## 2022-05-13 ENCOUNTER — Encounter: Payer: Self-pay | Admitting: Hematology & Oncology

## 2022-05-13 ENCOUNTER — Other Ambulatory Visit: Payer: Self-pay

## 2022-05-13 ENCOUNTER — Inpatient Hospital Stay (HOSPITAL_BASED_OUTPATIENT_CLINIC_OR_DEPARTMENT_OTHER): Payer: BC Managed Care – PPO | Admitting: Hematology & Oncology

## 2022-05-13 VITALS — BP 162/89 | HR 79 | Temp 98.4°F | Resp 16 | Wt 190.0 lb

## 2022-05-13 VITALS — BP 155/84 | HR 75 | Resp 18

## 2022-05-13 DIAGNOSIS — C50919 Malignant neoplasm of unspecified site of unspecified female breast: Secondary | ICD-10-CM

## 2022-05-13 DIAGNOSIS — Z5111 Encounter for antineoplastic chemotherapy: Secondary | ICD-10-CM | POA: Diagnosis not present

## 2022-05-13 DIAGNOSIS — C7951 Secondary malignant neoplasm of bone: Secondary | ICD-10-CM | POA: Insufficient documentation

## 2022-05-13 DIAGNOSIS — Z170 Estrogen receptor positive status [ER+]: Secondary | ICD-10-CM | POA: Insufficient documentation

## 2022-05-13 LAB — CBC WITH DIFFERENTIAL (CANCER CENTER ONLY)
Abs Immature Granulocytes: 0.02 10*3/uL (ref 0.00–0.07)
Basophils Absolute: 0 10*3/uL (ref 0.0–0.1)
Basophils Relative: 2 %
Eosinophils Absolute: 0 10*3/uL (ref 0.0–0.5)
Eosinophils Relative: 1 %
HCT: 29.2 % — ABNORMAL LOW (ref 36.0–46.0)
Hemoglobin: 10 g/dL — ABNORMAL LOW (ref 12.0–15.0)
Immature Granulocytes: 1 %
Lymphocytes Relative: 18 %
Lymphs Abs: 0.4 10*3/uL — ABNORMAL LOW (ref 0.7–4.0)
MCH: 33.7 pg (ref 26.0–34.0)
MCHC: 34.2 g/dL (ref 30.0–36.0)
MCV: 98.3 fL (ref 80.0–100.0)
Monocytes Absolute: 0.1 10*3/uL (ref 0.1–1.0)
Monocytes Relative: 3 %
Neutro Abs: 1.5 10*3/uL — ABNORMAL LOW (ref 1.7–7.7)
Neutrophils Relative %: 75 %
Platelet Count: 201 10*3/uL (ref 150–400)
RBC: 2.97 MIL/uL — ABNORMAL LOW (ref 3.87–5.11)
RDW: 14.9 % (ref 11.5–15.5)
Smear Review: NORMAL
WBC Count: 2 10*3/uL — ABNORMAL LOW (ref 4.0–10.5)
nRBC: 0 % (ref 0.0–0.2)

## 2022-05-13 LAB — CMP (CANCER CENTER ONLY)
ALT: 8 U/L (ref 0–44)
AST: 23 U/L (ref 15–41)
Albumin: 4 g/dL (ref 3.5–5.0)
Alkaline Phosphatase: 112 U/L (ref 38–126)
Anion gap: 11 (ref 5–15)
BUN: 12 mg/dL (ref 6–20)
CO2: 28 mmol/L (ref 22–32)
Calcium: 9.5 mg/dL (ref 8.9–10.3)
Chloride: 104 mmol/L (ref 98–111)
Creatinine: 0.7 mg/dL (ref 0.44–1.00)
GFR, Estimated: >60 mL/min (ref >60)
Glucose, Bld: 111 mg/dL — ABNORMAL HIGH (ref 70–99)
Potassium: 3.4 mmol/L — ABNORMAL LOW (ref 3.5–5.1)
Sodium: 143 mmol/L (ref 135–145)
Total Bilirubin: 0.5 mg/dL (ref 0.3–1.2)
Total Protein: 6.5 g/dL (ref 6.5–8.1)

## 2022-05-13 LAB — FERRITIN: Ferritin: 158 ng/mL (ref 11–307)

## 2022-05-13 LAB — IRON AND IRON BINDING CAPACITY (CC-WL,HP ONLY)
Iron: 87 ug/dL (ref 28–170)
Saturation Ratios: 32 % — ABNORMAL HIGH (ref 10.4–31.8)
TIBC: 272 ug/dL (ref 250–450)
UIBC: 185 ug/dL (ref 148–442)

## 2022-05-13 LAB — RETICULOCYTES
Immature Retic Fract: 24.6 % — ABNORMAL HIGH (ref 2.3–15.9)
RBC.: 2.96 MIL/uL — ABNORMAL LOW (ref 3.87–5.11)
Retic Count, Absolute: 55.9 10*3/uL (ref 19.0–186.0)
Retic Ct Pct: 1.9 % (ref 0.4–3.1)

## 2022-05-13 MED ORDER — FULVESTRANT 250 MG/5ML IM SOSY
500.0000 mg | PREFILLED_SYRINGE | Freq: Once | INTRAMUSCULAR | Status: AC
  Administered 2022-05-13: 500 mg via INTRAMUSCULAR
  Filled 2022-05-13: qty 10

## 2022-05-13 MED ORDER — ZOLEDRONIC ACID 4 MG/100ML IV SOLN
4.0000 mg | Freq: Once | INTRAVENOUS | Status: AC
  Administered 2022-05-13: 4 mg via INTRAVENOUS
  Filled 2022-05-13: qty 100

## 2022-05-13 MED ORDER — SODIUM CHLORIDE 0.9 % IV SOLN: Freq: Once | INTRAVENOUS | Status: AC
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
## 2022-05-13 NOTE — Patient Instructions (Signed)
Fulvestrant injection What is this medication? FULVESTRANT (ful VES trant) blocks the effects of estrogen. It is used to treat breast cancer. This medicine may be used for other purposes; ask your health care provider or pharmacist if you have questions. COMMON BRAND NAME(S): FASLODEX What should I tell my care team before I take this medication? They need to know if you have any of these conditions: bleeding disorders liver disease low blood counts, like low white cell, platelet, or red cell counts an unusual or allergic reaction to fulvestrant, other medicines, foods, dyes, or preservatives pregnant or trying to get pregnant breast-feeding How should I use this medication? This medicine is for injection into a muscle. It is usually given by a health care professional in a hospital or clinic setting. Talk to your pediatrician regarding the use of this medicine in children. Special care may be needed. Overdosage: If you think you have taken too much of this medicine contact a poison control center or emergency room at once. NOTE: This medicine is only for you. Do not share this medicine with others. What if I miss a dose? It is important not to miss your dose. Call your doctor or health care professional if you are unable to keep an appointment. What may interact with this medication? medicines that treat or prevent blood clots like warfarin, enoxaparin, dalteparin, apixaban, dabigatran, and rivaroxaban This list may not describe all possible interactions. Give your health care provider a list of all the medicines, herbs, non-prescription drugs, or dietary supplements you use. Also tell them if you smoke, drink alcohol, or use illegal drugs. Some items may interact with your medicine. What should I watch for while using this medication? Your condition will be monitored carefully while you are receiving this medicine. You will need important blood work done while you are taking this  medicine. Do not become pregnant while taking this medicine or for at least 1 year after stopping it. Women of child-bearing potential will need to have a negative pregnancy test before starting this medicine. Women should inform their doctor if they wish to become pregnant or think they might be pregnant. There is a potential for serious side effects to an unborn child. Men should inform their doctors if they wish to father a child. This medicine may lower sperm counts. Talk to your health care professional or pharmacist for more information. Do not breast-feed an infant while taking this medicine or for 1 year after the last dose. What side effects may I notice from receiving this medication? Side effects that you should report to your doctor or health care professional as soon as possible: allergic reactions like skin rash, itching or hives, swelling of the face, lips, or tongue feeling faint or lightheaded, falls pain, tingling, numbness, or weakness in the legs signs and symptoms of infection like fever or chills; cough; flu-like symptoms; sore throat vaginal bleeding Side effects that usually do not require medical attention (report to your doctor or health care professional if they continue or are bothersome): aches, pains constipation diarrhea headache hot flashes nausea, vomiting pain at site where injected stomach pain This list may not describe all possible side effects. Call your doctor for medical advice about side effects. You may report side effects to FDA at 1-800-FDA-1088. Where should I keep my medication? This drug is given in a hospital or clinic and will not be stored at home. NOTE: This sheet is a summary. It may not cover all possible information. If you have   questions about this medicine, talk to your doctor, pharmacist, or health care provider.  2023 Elsevier/Gold Standard (2018-01-11 00:00:00) Zoledronic Acid Injection (Cancer) What is this medication? ZOLEDRONIC  ACID (ZOE le dron ik AS id) treats high calcium levels in the blood caused by cancer. It may also be used with chemotherapy to treat weakened bones caused by cancer. It works by slowing down the release of calcium from bones. This lowers calcium levels in your blood. It also makes your bones stronger and less likely to break (fracture). It belongs to a group of medications called bisphosphonates. This medicine may be used for other purposes; ask your health care provider or pharmacist if you have questions. COMMON BRAND NAME(S): Zometa, Zometa Powder What should I tell my care team before I take this medication? They need to know if you have any of these conditions: Dehydration Dental disease Kidney disease Liver disease Low levels of calcium in the blood Lung or breathing disease, such as asthma Receiving steroids, such as dexamethasone or prednisone An unusual or allergic reaction to zoledronic acid, other medications, foods, dyes, or preservatives Pregnant or trying to get pregnant Breast-feeding How should I use this medication? This medication is injected into a vein. It is given by your care team in a hospital or clinic setting. Talk to your care team about the use of this medication in children. Special care may be needed. Overdosage: If you think you have taken too much of this medicine contact a poison control center or emergency room at once. NOTE: This medicine is only for you. Do not share this medicine with others. What if I miss a dose? Keep appointments for follow-up doses. It is important not to miss your dose. Call your care team if you are unable to keep an appointment. What may interact with this medication? Certain antibiotics given by injection Diuretics, such as bumetanide, furosemide NSAIDs, medications for pain and inflammation, such as ibuprofen or naproxen Teriparatide Thalidomide This list may not describe all possible interactions. Give your health care  provider a list of all the medicines, herbs, non-prescription drugs, or dietary supplements you use. Also tell them if you smoke, drink alcohol, or use illegal drugs. Some items may interact with your medicine. What should I watch for while using this medication? Visit your care team for regular checks on your progress. It may be some time before you see the benefit from this medication. Some people who take this medication have severe bone, joint, or muscle pain. This medication may also increase your risk for jaw problems or a broken thigh bone. Tell your care team right away if you have severe pain in your jaw, bones, joints, or muscles. Tell you care team if you have any pain that does not go away or that gets worse. Tell your dentist and dental surgeon that you are taking this medication. You should not have major dental surgery while on this medication. See your dentist to have a dental exam and fix any dental problems before starting this medication. Take good care of your teeth while on this medication. Make sure you see your dentist for regular follow-up appointments. You should make sure you get enough calcium and vitamin D while you are taking this medication. Discuss the foods you eat and the vitamins you take with your care team. Check with your care team if you have severe diarrhea, nausea, and vomiting, or if you sweat a lot. The loss of too much body fluid may make it dangerous   for you to take this medication. You may need bloodwork while taking this medication. Talk to your care team if you wish to become pregnant or think you might be pregnant. This medication can cause serious birth defects. What side effects may I notice from receiving this medication? Side effects that you should report to your care team as soon as possible: Allergic reactions--skin rash, itching, hives, swelling of the face, lips, tongue, or throat Kidney injury--decrease in the amount of urine, swelling of the  ankles, hands, or feet Low calcium level--muscle pain or cramps, confusion, tingling, or numbness in the hands or feet Osteonecrosis of the jaw--pain, swelling, or redness in the mouth, numbness of the jaw, poor healing after dental work, unusual discharge from the mouth, visible bones in the mouth Severe bone, joint, or muscle pain Side effects that usually do not require medical attention (report to your care team if they continue or are bothersome): Constipation Fatigue Fever Loss of appetite Nausea Stomach pain This list may not describe all possible side effects. Call your doctor for medical advice about side effects. You may report side effects to FDA at 1-800-FDA-1088. Where should I keep my medication? This medication is given in a hospital or clinic. It will not be stored at home. NOTE: This sheet is a summary. It may not cover all possible information. If you have questions about this medicine, talk to your doctor, pharmacist, or health care provider.  2023 Elsevier/Gold Standard (2021-11-21 00:00:00)  

## 2022-05-14 LAB — ERYTHROPOIETIN: Erythropoietin: 247.9 m[IU]/mL — ABNORMAL HIGH (ref 2.6–18.5)

## 2022-05-14 LAB — CANCER ANTIGEN 27.29: CA 27.29: 67.4 U/mL — ABNORMAL HIGH (ref 0.0–38.6)

## 2022-05-15 ENCOUNTER — Encounter: Payer: Self-pay | Admitting: *Deleted

## 2022-05-20 ENCOUNTER — Encounter: Payer: Self-pay | Admitting: *Deleted

## 2022-05-20 NOTE — Progress Notes (Signed)
Patient was seen and is doing well. She will need a PET scan prior to her next appointment. Spoke with insurance specialist and no PA is required.  MyChart message sent to patient to determine preference for scheduling.   Oncology Nurse Navigator Documentation     05/20/2022   10:30 AM  Oncology Nurse Navigator Flowsheets  Navigator Location [facility]  Navigator Encounter Type MyChart;Appt/Treatment Plan Review  Patient Visit Type MedOnc  Treatment Phase Active Tx  Barriers/Navigation Needs Coordination of Care  Education Other  Interventions Coordination of Care  Acuity Level 1-No Barriers  Coordination of Care Radiology;Appts  Education Method Written  Support Groups/Services Friends and Family  Time Spent with Patient 15

## 2022-05-21 ENCOUNTER — Encounter: Payer: Self-pay | Admitting: *Deleted

## 2022-05-21 ENCOUNTER — Encounter: Payer: Self-pay | Admitting: Hematology & Oncology

## 2022-05-21 NOTE — Progress Notes (Signed)
PET scan scheduled for 06/08/22. Message sent to patient via MyChart with appointment details including date, time, and preparation.   Oncology Nurse Navigator Documentation     05/21/2022    9:15 AM  Oncology Nurse Navigator Flowsheets  Navigator Follow Up Date: 06/08/2022  Navigator Follow Up Reason: Scan Review  Navigator Location [facility]  Navigator Encounter Type MyChart;Appt/Treatment Plan Review  Patient Visit Type MedOnc  Treatment Phase Active Tx  Barriers/Navigation Needs Coordination of Care  Education Other  Interventions Coordination of Care;Education  Acuity Level 1-No Barriers  Coordination of Care Radiology  Education Method Written  Support Groups/Services Friends and Family  Time Spent with Patient 30

## 2022-06-08 ENCOUNTER — Encounter (HOSPITAL_COMMUNITY)
Admission: RE | Admit: 2022-06-08 | Discharge: 2022-06-08 | Disposition: A | Discharge status: Home / Self Care | Payer: BC Managed Care – PPO | Source: Ambulatory Visit | Attending: Hematology & Oncology | Admitting: Hematology & Oncology

## 2022-06-08 DIAGNOSIS — C7951 Secondary malignant neoplasm of bone: Secondary | ICD-10-CM | POA: Diagnosis not present

## 2022-06-08 DIAGNOSIS — C50919 Malignant neoplasm of unspecified site of unspecified female breast: Secondary | ICD-10-CM | POA: Diagnosis not present

## 2022-06-08 LAB — GLUCOSE, CAPILLARY: Glucose-Capillary: 109 mg/dL — ABNORMAL HIGH (ref 70–99)

## 2022-06-08 MED ORDER — FLUDEOXYGLUCOSE F - 18 (FDG) INJECTION
9.5000 | Freq: Once | INTRAVENOUS | Status: AC | PRN
  Administered 2022-06-08: 9.12 via INTRAVENOUS

## 2022-06-09 ENCOUNTER — Other Ambulatory Visit (HOSPITAL_BASED_OUTPATIENT_CLINIC_OR_DEPARTMENT_OTHER): Payer: Self-pay

## 2022-06-09 ENCOUNTER — Other Ambulatory Visit: Payer: Self-pay | Admitting: Hematology & Oncology

## 2022-06-09 ENCOUNTER — Encounter: Payer: Self-pay | Admitting: *Deleted

## 2022-06-09 DIAGNOSIS — C50919 Malignant neoplasm of unspecified site of unspecified female breast: Secondary | ICD-10-CM

## 2022-06-09 DIAGNOSIS — C7951 Secondary malignant neoplasm of bone: Secondary | ICD-10-CM

## 2022-06-09 MED ORDER — OXYCODONE HCL 5 MG PO TABS
5.0000 mg | ORAL_TABLET | ORAL | 0 refills | Status: DC | PRN
  Filled 2022-06-09: qty 60, 5d supply, fill #0

## 2022-06-09 NOTE — Progress Notes (Signed)
PET scan results reviewed.   Oncology Nurse Navigator Documentation     06/09/2022    9:00 AM  Oncology Nurse Navigator Flowsheets  Navigator Follow Up Date: 06/10/2022  Navigator Follow Up Reason: Follow-up Appointment  Navigator Location [facility]  Navigator Encounter Type Scan Review  Patient Visit Type MedOnc  Treatment Phase Active Tx  Barriers/Navigation Needs Coordination of Care  Interventions None Required  Acuity Level 1-No Barriers  Support Groups/Services Friends and Family  Time Spent with Patient 15

## 2022-06-10 ENCOUNTER — Inpatient Hospital Stay: Payer: BC Managed Care – PPO

## 2022-06-10 ENCOUNTER — Encounter: Payer: Self-pay | Admitting: *Deleted

## 2022-06-10 ENCOUNTER — Encounter: Payer: Self-pay | Admitting: Hematology & Oncology

## 2022-06-10 ENCOUNTER — Other Ambulatory Visit: Payer: Self-pay

## 2022-06-10 ENCOUNTER — Other Ambulatory Visit (HOSPITAL_BASED_OUTPATIENT_CLINIC_OR_DEPARTMENT_OTHER): Payer: Self-pay

## 2022-06-10 ENCOUNTER — Inpatient Hospital Stay (HOSPITAL_BASED_OUTPATIENT_CLINIC_OR_DEPARTMENT_OTHER): Payer: BC Managed Care – PPO | Admitting: Hematology & Oncology

## 2022-06-10 VITALS — BP 148/84 | HR 75 | Temp 98.5°F | Resp 17 | Wt 187.8 lb

## 2022-06-10 DIAGNOSIS — C50919 Malignant neoplasm of unspecified site of unspecified female breast: Secondary | ICD-10-CM

## 2022-06-10 DIAGNOSIS — C7951 Secondary malignant neoplasm of bone: Secondary | ICD-10-CM

## 2022-06-10 DIAGNOSIS — Z170 Estrogen receptor positive status [ER+]: Secondary | ICD-10-CM | POA: Diagnosis not present

## 2022-06-10 DIAGNOSIS — Z5111 Encounter for antineoplastic chemotherapy: Secondary | ICD-10-CM | POA: Diagnosis not present

## 2022-06-10 LAB — CBC WITH DIFFERENTIAL (CANCER CENTER ONLY)
Abs Immature Granulocytes: 0.02 10*3/uL (ref 0.00–0.07)
Basophils Absolute: 0.1 10*3/uL (ref 0.0–0.1)
Basophils Relative: 2 %
Eosinophils Absolute: 0 10*3/uL (ref 0.0–0.5)
Eosinophils Relative: 1 %
HCT: 28.9 % — ABNORMAL LOW (ref 36.0–46.0)
Hemoglobin: 9.8 g/dL — ABNORMAL LOW (ref 12.0–15.0)
Immature Granulocytes: 1 %
Lymphocytes Relative: 15 %
Lymphs Abs: 0.4 10*3/uL — ABNORMAL LOW (ref 0.7–4.0)
MCH: 34.4 pg — ABNORMAL HIGH (ref 26.0–34.0)
MCHC: 33.9 g/dL (ref 30.0–36.0)
MCV: 101.4 fL — ABNORMAL HIGH (ref 80.0–100.0)
Monocytes Absolute: 0.1 10*3/uL (ref 0.1–1.0)
Monocytes Relative: 6 %
Neutro Abs: 1.9 10*3/uL (ref 1.7–7.7)
Neutrophils Relative %: 75 %
Platelet Count: 198 10*3/uL (ref 150–400)
RBC: 2.85 MIL/uL — ABNORMAL LOW (ref 3.87–5.11)
RDW: 15.3 % (ref 11.5–15.5)
Smear Review: NORMAL
WBC Count: 2.5 10*3/uL — ABNORMAL LOW (ref 4.0–10.5)
nRBC: 0.8 % — ABNORMAL HIGH (ref 0.0–0.2)

## 2022-06-10 LAB — SAVE SMEAR(SSMR), FOR PROVIDER SLIDE REVIEW

## 2022-06-10 LAB — CMP (CANCER CENTER ONLY)
ALT: 5 U/L (ref 0–44)
AST: 19 U/L (ref 15–41)
Albumin: 3.9 g/dL (ref 3.5–5.0)
Alkaline Phosphatase: 123 U/L (ref 38–126)
Anion gap: 7 (ref 5–15)
BUN: 16 mg/dL (ref 6–20)
CO2: 30 mmol/L (ref 22–32)
Calcium: 9.5 mg/dL (ref 8.9–10.3)
Chloride: 105 mmol/L (ref 98–111)
Creatinine: 0.72 mg/dL (ref 0.44–1.00)
GFR, Estimated: >60 mL/min (ref >60)
Glucose, Bld: 109 mg/dL — ABNORMAL HIGH (ref 70–99)
Potassium: 3.9 mmol/L (ref 3.5–5.1)
Sodium: 142 mmol/L (ref 135–145)
Total Bilirubin: 0.5 mg/dL (ref 0.3–1.2)
Total Protein: 6.7 g/dL (ref 6.5–8.1)

## 2022-06-10 LAB — IRON AND IRON BINDING CAPACITY (CC-WL,HP ONLY)
Iron: 47 ug/dL (ref 28–170)
Saturation Ratios: 17 % (ref 10.4–31.8)
TIBC: 273 ug/dL (ref 250–450)
UIBC: 226 ug/dL (ref 148–442)

## 2022-06-10 LAB — FERRITIN: Ferritin: 203 ng/mL (ref 11–307)

## 2022-06-10 MED ORDER — FULVESTRANT 250 MG/5ML IM SOSY
500.0000 mg | PREFILLED_SYRINGE | Freq: Once | INTRAMUSCULAR | Status: AC
  Administered 2022-06-10: 500 mg via INTRAMUSCULAR
  Filled 2022-06-10: qty 10

## 2022-06-10 NOTE — Progress Notes (Signed)
Oncology Nurse Navigator Documentation     06/10/2022    8:30 AM  Oncology Nurse Navigator Flowsheets  Navigator Follow Up Date: 07/09/2022  Navigator Follow Up Reason: Follow-up Appointment  Navigator Location [facility]  Navigator Encounter Type Appt/Treatment Plan Review  Patient Visit Type MedOnc  Treatment Phase Active Tx  Barriers/Navigation Needs No Barriers At This Time  Interventions None Required  Acuity Level 1-No Barriers  Support Groups/Services Friends and Family  Time Spent with Patient 15
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
## 2022-06-10 NOTE — Patient Instructions (Signed)

## 2022-06-11 ENCOUNTER — Other Ambulatory Visit: Payer: Self-pay

## 2022-06-11 ENCOUNTER — Ambulatory Visit
Admission: RE | Admit: 2022-06-11 | Discharge: 2022-06-11 | Disposition: A | Discharge status: Home / Self Care | Payer: BC Managed Care – PPO | Source: Ambulatory Visit | Attending: Radiation Oncology | Admitting: Radiation Oncology

## 2022-06-11 ENCOUNTER — Encounter: Payer: Self-pay | Admitting: Radiation Oncology

## 2022-06-11 VITALS — BP 137/78 | HR 84 | Temp 98.1°F | Resp 20 | Ht 68.0 in | Wt 187.4 lb

## 2022-06-11 DIAGNOSIS — C50919 Malignant neoplasm of unspecified site of unspecified female breast: Secondary | ICD-10-CM

## 2022-06-11 DIAGNOSIS — Z803 Family history of malignant neoplasm of breast: Secondary | ICD-10-CM | POA: Diagnosis not present

## 2022-06-11 DIAGNOSIS — Z170 Estrogen receptor positive status [ER+]: Secondary | ICD-10-CM | POA: Insufficient documentation

## 2022-06-11 DIAGNOSIS — Z923 Personal history of irradiation: Secondary | ICD-10-CM | POA: Insufficient documentation

## 2022-06-11 DIAGNOSIS — E669 Obesity, unspecified: Secondary | ICD-10-CM | POA: Diagnosis not present

## 2022-06-11 DIAGNOSIS — Z800 Family history of malignant neoplasm of digestive organs: Secondary | ICD-10-CM | POA: Diagnosis not present

## 2022-06-11 DIAGNOSIS — N83202 Unspecified ovarian cyst, left side: Secondary | ICD-10-CM | POA: Insufficient documentation

## 2022-06-11 DIAGNOSIS — C50411 Malignant neoplasm of upper-outer quadrant of right female breast: Secondary | ICD-10-CM | POA: Diagnosis not present

## 2022-06-11 DIAGNOSIS — J90 Pleural effusion, not elsewhere classified: Secondary | ICD-10-CM | POA: Diagnosis not present

## 2022-06-11 DIAGNOSIS — C7951 Secondary malignant neoplasm of bone: Secondary | ICD-10-CM | POA: Diagnosis not present

## 2022-06-11 DIAGNOSIS — Z79899 Other long term (current) drug therapy: Secondary | ICD-10-CM | POA: Diagnosis not present

## 2022-06-11 LAB — ERYTHROPOIETIN: Erythropoietin: 276.3 m[IU]/mL — ABNORMAL HIGH (ref 2.6–18.5)

## 2022-06-11 LAB — CANCER ANTIGEN 27.29: CA 27.29: 81.7 U/mL — ABNORMAL HIGH (ref 0.0–38.6)

## 2022-06-11 NOTE — Progress Notes (Signed)
Radiation Oncology         [phone] ________________________________  Outpatient Re-Consultation  Name: Annette James MRN: [id number]  Date: 06/11/2022  DOB: 18-Nov-1960  JE:HUDJS, Arvid Right, MD  Volanda Napoleon, MD   REFERRING PHYSICIAN: Volanda Napoleon, MD  DIAGNOSIS: The encounter diagnosis was Breast cancer metastasized to bone, unspecified laterality ([place]).  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 female who is seen as a courtesy of Dr. Marin Olp for re-evaluation and an opinion concerning radiation therapy as part of management for her diagnosed metastatic breast cancer. To review, I last met with the patient for follow-up on 12/25/21. Since then, the patient has been continuing with Zometa and faslodex under the care of Dr. Marin Olp. In the interval, the patient has had no evidence of disease progression per say, however her most recent PET scan on 06/08/22 (further detailed below) showed a slight increase in uptake at T12. Even though the patient is asymptomatic from a disease standpoint, Dr. Marin Olp would like to her to have radiation to this site to prevent any problems.   Imaging performed in the interval since the patient was last seen for follow up on 12/25/21 includes the following:  -- Restaging PET scan on 02/09/22 showed increased hypermetabolism associated with metastatic disease throughout the visualized osseous structures possibly due to interval healing. Otherwise, no other evidence  of metastatic disease was appreciated.  -- Restaging PET scan on 06/08/22 showed widespread bony metastatic disease with variable degrees of FDG uptake as largely similar to previous imaging other than a possible slight increase in uptake at T12 (SUV max of 8.07, previously 6.62) . PET also showed trace bilateral pleural effusions;  slightly increased on the left and new on the right. Otherwise, no signs of solid organ or nodal metastases were appreciated.    Per her most recent follow-up visit with Dr. Marin Olp on 06/10/22, the patient denied any back pain, (her body pain is well controlled with her current pain regimen). Dr. Marin Olp did note concerns about anemia given a slight downward Hg trend seen on recent labs, for which Dr. Marin Olp has ordered iron studies to evaluate and has given the patient stool cards for home testing. Dr. Marin Olp has also expressed concerns regarding the possibility of  marrow involvement by malignancy. This would be hard to prove unless the patient has bone marrow biopsies, however, we would like to avoid this invasive approach for as long as possible. - (recent labs showed adequate iron levels, further labs area pending at this time).      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 Gery Pray   Metastatic cancer to spine [facility]) 10/19/2020    PAST SURGICAL HISTORY: Past Surgical History:  Procedure Laterality Date   APPENDECTOMY     CESAREAN SECTION     x 4    CHOLECYSTECTOMY      FAMILY HISTORY:  Family History  Problem Relation Age of Onset   Breast cancer Mother    Hypertension Mother    Colon cancer Father    Hypertension Father    Alcoholism Brother    Alcoholism Brother     SOCIAL HISTORY:  Social History   Tobacco Use   Smoking status: Never   Smokeless tobacco: Never  Vaping Use   Vaping Use: Never used  Substance Use Topics   Alcohol use: Not Currently   Drug use: Not Currently    ALLERGIES: No Known Allergies  MEDICATIONS:  Current Outpatient Medications  Medication Sig Dispense Refill   ascorbic acid (VITAMIN C) 500 MG tablet Take 500 mg by mouth daily.     cyclobenzaprine (FLEXERIL) 5 MG tablet TAKE 1 TABLET BY MOUTH THREE TIMES A DAY AS NEEDED FOR MUSCLE SPASMS 30 tablet 2   fentaNYL (DURAGESIC) 50 MCG/HR Place 1 patch onto the skin every other day. 15 patch 0   IBRANCE 125 MG tablet TAKE 1 TABLET ONCE DAILY WITH OR WITHOUT FOOD FOR 21 DAYS ON, FOLLOWED BY 7 DAYS OFF, REPEATED EVERY 28 DAYS 21 tablet 12   Multiple Vitamins-Minerals (ONE-A-DAY WOMENS 50 PLUS) TABS Take 1 tablet by mouth daily.     ondansetron (ZOFRAN) 8 MG tablet Take 1 tablet (8 mg total) by mouth every 8 (eight) hours as needed for nausea or vomiting. 90 tablet 1   oxyCODONE (OXY IR/ROXICODONE) 5 MG immediate release tablet Take 1-2 tablets (5-10 mg total) by mouth every 4 (four) hours as needed. 60 tablet 0   No current facility-administered medications for this encounter.    REVIEW OF SYSTEMS:  A 10+ POINT REVIEW OF SYSTEMS WAS OBTAINED including neurology, dermatology, psychiatry, cardiac, respiratory, lymph, extremities, GI, GU, musculoskeletal, constitutional, reproductive, HEENT.  She denies any pain within the pelvis area.  She denies any pain within the mid back region.   PHYSICAL EXAM:  height is _0  (1.727 m) and weight is 187 lb 6.4 oz (85 kg). Her temperature is 98.1 F (36.7 C). Her blood pressure is 137/78 and her pulse is 84. Her  respiration is 20 and oxygen saturation is 99%.   General: Alert and oriented, in no acute distress HEENT: Head is normocephalic. Extraocular movements are intact. Oropharynx is clear. Neck: Neck is supple, no palpable cervical or supraclavicular lymphadenopathy. Heart: Regular in rate and rhythm with no murmurs, rubs, or gallops. Chest: Clear to auscultation bilaterally, with no rhonchi, wheezes, or rales. Abdomen: Soft, nontender, nondistended, with no rigidity or guarding. Extremities: No cyanosis or edema. Lymphatics: see Neck Exam Skin: No concerning lesions. Musculoskeletal: symmetric strength and muscle tone throughout. Neurologic: Cranial nerves II through XII are grossly intact. No obvious focalities. Speech is fluent. Coordination is intact. Psychiatric: Judgment and insight are intact. Affect is appropriate. Palpation along the mid back area reveals no point tenderness.   ECOG = 1  0 - Asymptomatic (Fully active, able to carry on all predisease activities without restriction)  1 - Symptomatic but completely ambulatory (Restricted in physically strenuous activity but ambulatory and  able to carry out work of a light or sedentary nature. For example, light housework, office work)  2 - Symptomatic, <50% in bed during the day (Ambulatory and capable of all self care but unable to carry out any work activities. Up and about more than 50% of waking hours)  3 - Symptomatic, >50% in bed, but not bedbound (Capable of only limited self-care, confined to bed or chair 50% or more of waking hours)  4 - Bedbound (Completely disabled. Cannot carry on any self-care. Totally confined to bed or chair)  5 - Death   Eustace Pen MM, Creech RH, Tormey DC, et al. [phone]). "Toxicity and response criteria of the [facility]". [place] Oncol. 5 (6): 649-55  LABORATORY DATA:  Lab Results  Component Value Date   WBC 2.5 (L) 06/10/2022   HGB 9.8 (L) 06/10/2022   HCT 28.9 (L)  06/10/2022   MCV 101.4 (H) 06/10/2022   PLT 198 06/10/2022   NEUTROABS 1.9 06/10/2022   Lab Results  Component Value Date   NA 142 06/10/2022   K 3.9 06/10/2022   CL 105 06/10/2022   CO2 30 06/10/2022   GLUCOSE 109 (H) 06/10/2022   BUN 16 06/10/2022   CREATININE 0.72 06/10/2022   CALCIUM 9.5 06/10/2022      RADIOGRAPHY: NM PET Image Restag (PS) Skull Base To Thigh  Result Date: 06/08/2022 CLINICAL DATA:  Subsequent treatment strategy for breast cancer. EXAM: NUCLEAR MEDICINE PET SKULL BASE TO THIGH TECHNIQUE: 9.12 mCi F-18 FDG was injected intravenously. Full-ring PET imaging was performed from the skull base to thigh after the radiotracer. CT data was obtained and used for attenuation correction and anatomic localization. Fasting blood glucose: 109 mg/dl COMPARISON:  Multiple prior exams, most recent comparison from Feb 09, 2022 FINDINGS: Mediastinal blood pool activity: SUV max 1.96 Liver activity: SUV max NA NECK: No hypermetabolic lymph nodes in the neck. Incidental CT findings: None. CHEST: No hypermetabolic mediastinal or hilar nodes. No suspicious pulmonary nodules on the CT scan. Incidental CT findings: No adenopathy by size criteria in the chest. Normal noncontrast appearance of the heart and great vessels. Trace bilateral pleural effusions similar on the LEFT and new on the RIGHT compared to previous imaging. No overt nodularity or focal increased metabolic activity in these areas this time. ABDOMEN/PELVIS: No abnormal hypermetabolic activity within the liver, pancreas, adrenal glands, or spleen. No hypermetabolic lymph nodes in the abdomen or pelvis. Incidental CT findings: Post cholecystectomy with smooth hepatic contours. No acute findings relative to pancreas, spleen, adrenal glands, kidneys, stomach, small and large bowel. Cystic LEFT adnexal lesion measuring approximately 4.6 x 3.3 cm without increased metabolic activity, previously approximately 4.3 cm greatest axial dimension No  adenopathy by size criteria in the abdomen or in the pelvis. SKELETON: Widespread bony metastases with similar distribution to previous imaging. A all visualized bones involved to some extent. RIGHT humeral metastasis with similar appearance on CT with a maximum SUV of 5.82 as compared to 5.86. Multifocal spinal metastatic disease throughout the thoracic and lumbar spine with areas of involvement also in the cervical spine with similar distribution pattern. Maximum SUV at T12 of 8.07 as compared to 6.62 and at L1 of 5.57 as compared to 6.47. Signs of sternal metastatic disease, maximum SUV of the tip of the sternum visually similar to the prior study at 6.27 Similar involvement of the bony pelvis with both lytic and sclerotic appearance. Incidental CT findings: None. IMPRESSION: Widespread bony metastatic disease with variable degrees  of FDG uptake largely similar to previous imaging perhaps with some increase at T12. No signs of solid organ or nodal metastases. Trace bilateral pleural effusions slightly increased on the LEFT and new on the RIGHT. Simple appearing LEFT ovarian cyst may be slightly enlarged. Recommend follow-up US in 6-12 months. Note: This recommendation does not apply to premenarchal patients and to those with increased risk (genetic, family history, elevated tumor markers or other high-risk factors) of ovarian cancer. Reference: JACR 2020 Feb; 17(2):248-254 Electronically Signed   By: Zetta Bills M.D.   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  Given these findings she would be a candidate for palliative radiation therapy to this region to avoid further progression and potential pathologic fracture.  I  discussed the planned course of treatment anticipated side effects and potential long-term toxicities of radiation to the lower thoracic spine area with her.  She appears to understand and wishes to proceed with planned course of treatment.  PLAN: She will return in the next several days for CT simulation with treatments to begin a few days afterward.  Anticipate 10 fractions directed at the T12 region.   35 minutes of total time was spent for this patient encounter, including preparation, face-to-face counseling with the patient and coordination of care, physical exam, and documentation of the encounter.   ------------------------------------------------  Blair Promise, PhD, MD  This document serves as a record of services personally performed by Gery Pray, MD. It was created on his behalf by Roney Mans, a trained medical scribe. The creation of this record is based on the scribe's personal observations and the provider's statements to them. This document has been checked and approved by the attending provider.

## 2022-06-11 NOTE — Progress Notes (Signed)
Histology and Location of Primary Cancer: Metastatic breast cancer  Location(s) of Symptomatic Metastases: T12  Past/Anticipated chemotherapy by medical oncology, if any: Faslodex 500 mg IM monthly-start on 11/06/2020. Ibrance 125 MG PO Q DAY (21 days on/7 days off). Zometa 4 mg IV q. 3 months -- next dose on 08/2022  Pain on a scale of 0-10 is: 0 - uses a Fentanyl patch and takes oxycodone as needed.    If Spine Met(s), symptoms, if any, include: Bowel/Bladder retention or incontinence (please describe): no Numbness or weakness in extremities (please describe): no Current Decadron regimen, if applicable: no  Ambulatory status? Walker? Wheelchair?: Ambulatory  SAFETY ISSUES: Prior radiation? 11/10/2021 through 11/21/2021 right hip 30 Gy, 11/13/2020-11/29/2020 left hip Pacemaker/ICD? no Possible current pregnancy? no Is the patient on methotrexate? no  Current Complaints / other details:  None noted.  BP 137/78 (BP Location: Left Arm, Patient Position: Sitting, Cuff Size: Large)   Pulse 84   Temp 98.1 F (36.7 C)   Resp 20   Ht _0  (1.727 m)   Wt 187 lb 6.4 oz (85 kg)   SpO2 99%   BMI 28.49 kg/m

## 2022-06-17 ENCOUNTER — Other Ambulatory Visit: Payer: Self-pay | Admitting: Hematology & Oncology

## 2022-06-17 ENCOUNTER — Encounter: Payer: Self-pay | Admitting: *Deleted

## 2022-06-17 DIAGNOSIS — C7951 Secondary malignant neoplasm of bone: Secondary | ICD-10-CM

## 2022-06-17 DIAGNOSIS — C50919 Malignant neoplasm of unspecified site of unspecified female breast: Secondary | ICD-10-CM

## 2022-06-17 MED ORDER — FENTANYL 50 MCG/HR TD PT72: 1.0000 | MEDICATED_PATCH | TRANSDERMAL | 0 refills | Status: DC

## 2022-06-23 ENCOUNTER — Other Ambulatory Visit: Payer: Self-pay | Admitting: *Deleted

## 2022-06-23 ENCOUNTER — Other Ambulatory Visit: Payer: Self-pay

## 2022-06-23 ENCOUNTER — Inpatient Hospital Stay: Payer: BC Managed Care – PPO | Attending: Hematology & Oncology

## 2022-06-23 ENCOUNTER — Ambulatory Visit
Admission: RE | Admit: 2022-06-23 | Discharge: 2022-06-23 | Disposition: A | Discharge status: Home / Self Care | Payer: BC Managed Care – PPO | Source: Ambulatory Visit | Attending: Radiation Oncology | Admitting: Radiation Oncology

## 2022-06-23 DIAGNOSIS — D500 Iron deficiency anemia secondary to blood loss (chronic): Secondary | ICD-10-CM

## 2022-06-23 DIAGNOSIS — C7951 Secondary malignant neoplasm of bone: Secondary | ICD-10-CM | POA: Insufficient documentation

## 2022-06-23 DIAGNOSIS — C50919 Malignant neoplasm of unspecified site of unspecified female breast: Secondary | ICD-10-CM

## 2022-06-23 LAB — OCCULT BLOOD X 1 CARD TO LAB, STOOL
Fecal Occult Bld: NEGATIVE
Fecal Occult Bld: NEGATIVE
Fecal Occult Bld: NEGATIVE

## 2022-06-24 ENCOUNTER — Encounter: Payer: Self-pay | Admitting: *Deleted

## 2022-06-30 DIAGNOSIS — C50411 Malignant neoplasm of upper-outer quadrant of right female breast: Secondary | ICD-10-CM | POA: Diagnosis not present

## 2022-06-30 DIAGNOSIS — Z170 Estrogen receptor positive status [ER+]: Secondary | ICD-10-CM | POA: Insufficient documentation

## 2022-06-30 DIAGNOSIS — C7951 Secondary malignant neoplasm of bone: Secondary | ICD-10-CM | POA: Diagnosis not present

## 2022-06-30 DIAGNOSIS — Z510 Encounter for antineoplastic radiation therapy: Secondary | ICD-10-CM | POA: Insufficient documentation

## 2022-07-01 ENCOUNTER — Ambulatory Visit
Admission: RE | Admit: 2022-07-01 | Discharge: 2022-07-01 | Disposition: A | Discharge status: Home / Self Care | Payer: BC Managed Care – PPO | Source: Ambulatory Visit | Attending: Radiation Oncology | Admitting: Radiation Oncology

## 2022-07-01 ENCOUNTER — Other Ambulatory Visit: Payer: Self-pay

## 2022-07-01 ENCOUNTER — Encounter: Payer: Self-pay | Admitting: *Deleted

## 2022-07-01 DIAGNOSIS — C50919 Malignant neoplasm of unspecified site of unspecified female breast: Secondary | ICD-10-CM

## 2022-07-01 DIAGNOSIS — Z170 Estrogen receptor positive status [ER+]: Secondary | ICD-10-CM | POA: Diagnosis not present

## 2022-07-01 DIAGNOSIS — C7951 Secondary malignant neoplasm of bone: Secondary | ICD-10-CM | POA: Diagnosis not present

## 2022-07-01 DIAGNOSIS — Z510 Encounter for antineoplastic radiation therapy: Secondary | ICD-10-CM | POA: Diagnosis not present

## 2022-07-01 DIAGNOSIS — C50411 Malignant neoplasm of upper-outer quadrant of right female breast: Secondary | ICD-10-CM | POA: Diagnosis not present

## 2022-07-01 LAB — RAD ONC ARIA SESSION SUMMARY
Course Elapsed Days: 0
Plan Fractions Treated to Date: 1
Plan Prescribed Dose Per Fraction: 3 Gy
Plan Total Fractions Prescribed: 10
Plan Total Prescribed Dose: 30 Gy
Reference Point Dosage Given to Date: 3 Gy
Reference Point Session Dosage Given: 3 Gy
Session Number: 1

## 2022-07-02 ENCOUNTER — Ambulatory Visit
Admission: RE | Admit: 2022-07-02 | Discharge: 2022-07-02 | Disposition: A | Discharge status: Home / Self Care | Payer: BC Managed Care – PPO | Source: Ambulatory Visit | Attending: Radiation Oncology | Admitting: Radiation Oncology

## 2022-07-02 ENCOUNTER — Other Ambulatory Visit: Payer: Self-pay

## 2022-07-02 DIAGNOSIS — Z510 Encounter for antineoplastic radiation therapy: Secondary | ICD-10-CM | POA: Diagnosis not present

## 2022-07-02 DIAGNOSIS — Z170 Estrogen receptor positive status [ER+]: Secondary | ICD-10-CM | POA: Diagnosis not present

## 2022-07-02 DIAGNOSIS — C7951 Secondary malignant neoplasm of bone: Secondary | ICD-10-CM | POA: Diagnosis not present

## 2022-07-02 DIAGNOSIS — C50411 Malignant neoplasm of upper-outer quadrant of right female breast: Secondary | ICD-10-CM | POA: Diagnosis not present

## 2022-07-02 LAB — RAD ONC ARIA SESSION SUMMARY
Course Elapsed Days: 1
Plan Fractions Treated to Date: 2
Plan Prescribed Dose Per Fraction: 3 Gy
Plan Total Fractions Prescribed: 10
Plan Total Prescribed Dose: 30 Gy
Reference Point Dosage Given to Date: 6 Gy
Reference Point Session Dosage Given: 3 Gy
Session Number: 2

## 2022-07-03 ENCOUNTER — Encounter: Payer: Self-pay | Admitting: *Deleted

## 2022-07-03 ENCOUNTER — Other Ambulatory Visit: Payer: Self-pay

## 2022-07-03 ENCOUNTER — Ambulatory Visit
Admission: RE | Admit: 2022-07-03 | Discharge: 2022-07-03 | Disposition: A | Discharge status: Home / Self Care | Payer: BC Managed Care – PPO | Source: Ambulatory Visit | Attending: Radiation Oncology | Admitting: Radiation Oncology

## 2022-07-03 DIAGNOSIS — C50411 Malignant neoplasm of upper-outer quadrant of right female breast: Secondary | ICD-10-CM | POA: Diagnosis not present

## 2022-07-03 DIAGNOSIS — Z510 Encounter for antineoplastic radiation therapy: Secondary | ICD-10-CM | POA: Diagnosis not present

## 2022-07-03 DIAGNOSIS — C7951 Secondary malignant neoplasm of bone: Secondary | ICD-10-CM | POA: Diagnosis not present

## 2022-07-03 DIAGNOSIS — Z170 Estrogen receptor positive status [ER+]: Secondary | ICD-10-CM | POA: Diagnosis not present

## 2022-07-03 LAB — RAD ONC ARIA SESSION SUMMARY
Course Elapsed Days: 2
Plan Fractions Treated to Date: 3
Plan Prescribed Dose Per Fraction: 3 Gy
Plan Total Fractions Prescribed: 10
Plan Total Prescribed Dose: 30 Gy
Reference Point Dosage Given to Date: 9 Gy
Reference Point Session Dosage Given: 3 Gy
Session Number: 3

## 2022-07-06 ENCOUNTER — Other Ambulatory Visit: Payer: Self-pay

## 2022-07-06 ENCOUNTER — Ambulatory Visit
Admission: RE | Admit: 2022-07-06 | Discharge: 2022-07-06 | Disposition: A | Discharge status: Home / Self Care | Payer: BC Managed Care – PPO | Source: Ambulatory Visit | Attending: Radiation Oncology | Admitting: Radiation Oncology

## 2022-07-06 DIAGNOSIS — Z510 Encounter for antineoplastic radiation therapy: Secondary | ICD-10-CM | POA: Diagnosis not present

## 2022-07-06 DIAGNOSIS — Z170 Estrogen receptor positive status [ER+]: Secondary | ICD-10-CM | POA: Diagnosis not present

## 2022-07-06 DIAGNOSIS — C7951 Secondary malignant neoplasm of bone: Secondary | ICD-10-CM | POA: Diagnosis not present

## 2022-07-06 DIAGNOSIS — C50411 Malignant neoplasm of upper-outer quadrant of right female breast: Secondary | ICD-10-CM | POA: Diagnosis not present

## 2022-07-06 LAB — RAD ONC ARIA SESSION SUMMARY
Course Elapsed Days: 5
Plan Fractions Treated to Date: 4
Plan Prescribed Dose Per Fraction: 3 Gy
Plan Total Fractions Prescribed: 10
Plan Total Prescribed Dose: 30 Gy
Reference Point Dosage Given to Date: 12 Gy
Reference Point Session Dosage Given: 3 Gy
Session Number: 4

## 2022-07-07 ENCOUNTER — Other Ambulatory Visit: Payer: Self-pay

## 2022-07-07 ENCOUNTER — Ambulatory Visit: Payer: BC Managed Care – PPO

## 2022-07-07 ENCOUNTER — Ambulatory Visit
Admission: RE | Admit: 2022-07-07 | Discharge: 2022-07-07 | Disposition: A | Discharge status: Home / Self Care | Payer: BC Managed Care – PPO | Source: Ambulatory Visit | Attending: Radiation Oncology | Admitting: Radiation Oncology

## 2022-07-07 DIAGNOSIS — C50411 Malignant neoplasm of upper-outer quadrant of right female breast: Secondary | ICD-10-CM | POA: Diagnosis not present

## 2022-07-07 DIAGNOSIS — Z170 Estrogen receptor positive status [ER+]: Secondary | ICD-10-CM | POA: Diagnosis not present

## 2022-07-07 DIAGNOSIS — C7951 Secondary malignant neoplasm of bone: Secondary | ICD-10-CM | POA: Diagnosis not present

## 2022-07-07 DIAGNOSIS — Z510 Encounter for antineoplastic radiation therapy: Secondary | ICD-10-CM | POA: Diagnosis not present

## 2022-07-07 LAB — RAD ONC ARIA SESSION SUMMARY
Course Elapsed Days: 6
Plan Fractions Treated to Date: 5
Plan Prescribed Dose Per Fraction: 3 Gy
Plan Total Fractions Prescribed: 10
Plan Total Prescribed Dose: 30 Gy
Reference Point Dosage Given to Date: 15 Gy
Reference Point Session Dosage Given: 3 Gy
Session Number: 5

## 2022-07-08 ENCOUNTER — Other Ambulatory Visit: Payer: Self-pay

## 2022-07-08 ENCOUNTER — Ambulatory Visit
Admission: RE | Admit: 2022-07-08 | Discharge: 2022-07-08 | Disposition: A | Discharge status: Home / Self Care | Payer: BC Managed Care – PPO | Source: Ambulatory Visit | Attending: Radiation Oncology | Admitting: Radiation Oncology

## 2022-07-08 DIAGNOSIS — Z170 Estrogen receptor positive status [ER+]: Secondary | ICD-10-CM | POA: Diagnosis not present

## 2022-07-08 DIAGNOSIS — C50411 Malignant neoplasm of upper-outer quadrant of right female breast: Secondary | ICD-10-CM | POA: Diagnosis not present

## 2022-07-08 DIAGNOSIS — Z510 Encounter for antineoplastic radiation therapy: Secondary | ICD-10-CM | POA: Diagnosis not present

## 2022-07-08 DIAGNOSIS — C7951 Secondary malignant neoplasm of bone: Secondary | ICD-10-CM | POA: Diagnosis not present

## 2022-07-08 LAB — RAD ONC ARIA SESSION SUMMARY
Course Elapsed Days: 7
Plan Fractions Treated to Date: 6
Plan Prescribed Dose Per Fraction: 3 Gy
Plan Total Fractions Prescribed: 10
Plan Total Prescribed Dose: 30 Gy
Reference Point Dosage Given to Date: 18 Gy
Reference Point Session Dosage Given: 3 Gy
Session Number: 6

## 2022-07-09 ENCOUNTER — Inpatient Hospital Stay: Payer: BC Managed Care – PPO

## 2022-07-09 ENCOUNTER — Ambulatory Visit: Payer: BC Managed Care – PPO

## 2022-07-09 ENCOUNTER — Ambulatory Visit
Admission: RE | Admit: 2022-07-09 | Discharge: 2022-07-09 | Disposition: A | Discharge status: Home / Self Care | Payer: BC Managed Care – PPO | Source: Ambulatory Visit | Attending: Radiation Oncology | Admitting: Radiation Oncology

## 2022-07-09 ENCOUNTER — Other Ambulatory Visit: Payer: Self-pay

## 2022-07-09 ENCOUNTER — Ambulatory Visit: Payer: BC Managed Care – PPO | Admitting: Hematology & Oncology

## 2022-07-09 DIAGNOSIS — C7951 Secondary malignant neoplasm of bone: Secondary | ICD-10-CM | POA: Diagnosis not present

## 2022-07-09 DIAGNOSIS — Z510 Encounter for antineoplastic radiation therapy: Secondary | ICD-10-CM | POA: Diagnosis not present

## 2022-07-09 DIAGNOSIS — Z170 Estrogen receptor positive status [ER+]: Secondary | ICD-10-CM | POA: Diagnosis not present

## 2022-07-09 DIAGNOSIS — C50411 Malignant neoplasm of upper-outer quadrant of right female breast: Secondary | ICD-10-CM | POA: Diagnosis not present

## 2022-07-09 LAB — RAD ONC ARIA SESSION SUMMARY
Course Elapsed Days: 8
Plan Fractions Treated to Date: 7
Plan Prescribed Dose Per Fraction: 3 Gy
Plan Total Fractions Prescribed: 10
Plan Total Prescribed Dose: 30 Gy
Reference Point Dosage Given to Date: 21 Gy
Reference Point Session Dosage Given: 3 Gy
Session Number: 7

## 2022-07-10 ENCOUNTER — Ambulatory Visit
Admission: RE | Admit: 2022-07-10 | Discharge: 2022-07-10 | Disposition: A | Discharge status: Home / Self Care | Payer: BC Managed Care – PPO | Source: Ambulatory Visit | Attending: Radiation Oncology | Admitting: Radiation Oncology

## 2022-07-10 ENCOUNTER — Other Ambulatory Visit: Payer: Self-pay

## 2022-07-10 DIAGNOSIS — Z170 Estrogen receptor positive status [ER+]: Secondary | ICD-10-CM | POA: Diagnosis not present

## 2022-07-10 DIAGNOSIS — C7951 Secondary malignant neoplasm of bone: Secondary | ICD-10-CM | POA: Diagnosis not present

## 2022-07-10 DIAGNOSIS — Z510 Encounter for antineoplastic radiation therapy: Secondary | ICD-10-CM | POA: Diagnosis not present

## 2022-07-10 DIAGNOSIS — C50411 Malignant neoplasm of upper-outer quadrant of right female breast: Secondary | ICD-10-CM | POA: Diagnosis not present

## 2022-07-10 LAB — RAD ONC ARIA SESSION SUMMARY
Course Elapsed Days: 9
Plan Fractions Treated to Date: 8
Plan Prescribed Dose Per Fraction: 3 Gy
Plan Total Fractions Prescribed: 10
Plan Total Prescribed Dose: 30 Gy
Reference Point Dosage Given to Date: 24 Gy
Reference Point Session Dosage Given: 3 Gy
Session Number: 8

## 2022-07-13 ENCOUNTER — Other Ambulatory Visit: Payer: Self-pay

## 2022-07-13 ENCOUNTER — Ambulatory Visit
Admission: RE | Admit: 2022-07-13 | Discharge: 2022-07-13 | Disposition: A | Discharge status: Home / Self Care | Payer: BC Managed Care – PPO | Source: Ambulatory Visit | Attending: Radiation Oncology | Admitting: Radiation Oncology

## 2022-07-13 DIAGNOSIS — Z170 Estrogen receptor positive status [ER+]: Secondary | ICD-10-CM | POA: Diagnosis not present

## 2022-07-13 DIAGNOSIS — C50411 Malignant neoplasm of upper-outer quadrant of right female breast: Secondary | ICD-10-CM | POA: Diagnosis not present

## 2022-07-13 DIAGNOSIS — Z510 Encounter for antineoplastic radiation therapy: Secondary | ICD-10-CM | POA: Diagnosis not present

## 2022-07-13 DIAGNOSIS — C7951 Secondary malignant neoplasm of bone: Secondary | ICD-10-CM | POA: Diagnosis not present

## 2022-07-13 LAB — RAD ONC ARIA SESSION SUMMARY
Course Elapsed Days: 12
Plan Fractions Treated to Date: 9
Plan Prescribed Dose Per Fraction: 3 Gy
Plan Total Fractions Prescribed: 10
Plan Total Prescribed Dose: 30 Gy
Reference Point Dosage Given to Date: 27 Gy
Reference Point Session Dosage Given: 3 Gy
Session Number: 9

## 2022-07-14 ENCOUNTER — Encounter: Payer: Self-pay | Admitting: Radiation Oncology

## 2022-07-14 ENCOUNTER — Other Ambulatory Visit: Payer: Self-pay | Admitting: Hematology & Oncology

## 2022-07-14 ENCOUNTER — Ambulatory Visit
Admission: RE | Admit: 2022-07-14 | Discharge: 2022-07-14 | Disposition: A | Discharge status: Home / Self Care | Payer: BC Managed Care – PPO | Source: Ambulatory Visit | Attending: Radiation Oncology | Admitting: Radiation Oncology

## 2022-07-14 ENCOUNTER — Other Ambulatory Visit: Payer: Self-pay

## 2022-07-14 ENCOUNTER — Other Ambulatory Visit (HOSPITAL_BASED_OUTPATIENT_CLINIC_OR_DEPARTMENT_OTHER): Payer: Self-pay

## 2022-07-14 DIAGNOSIS — C50919 Malignant neoplasm of unspecified site of unspecified female breast: Secondary | ICD-10-CM

## 2022-07-14 DIAGNOSIS — C7951 Secondary malignant neoplasm of bone: Secondary | ICD-10-CM

## 2022-07-14 DIAGNOSIS — Z170 Estrogen receptor positive status [ER+]: Secondary | ICD-10-CM | POA: Diagnosis not present

## 2022-07-14 DIAGNOSIS — Z510 Encounter for antineoplastic radiation therapy: Secondary | ICD-10-CM | POA: Diagnosis not present

## 2022-07-14 DIAGNOSIS — C50411 Malignant neoplasm of upper-outer quadrant of right female breast: Secondary | ICD-10-CM | POA: Diagnosis not present

## 2022-07-14 LAB — RAD ONC ARIA SESSION SUMMARY
Course Elapsed Days: 13
Plan Fractions Treated to Date: 10
Plan Prescribed Dose Per Fraction: 3 Gy
Plan Total Fractions Prescribed: 10
Plan Total Prescribed Dose: 30 Gy
Reference Point Dosage Given to Date: 30 Gy
Reference Point Session Dosage Given: 3 Gy
Session Number: 10

## 2022-07-14 MED ORDER — OXYCODONE HCL 5 MG PO TABS: 5.0000 mg | ORAL_TABLET | ORAL | 0 refills | Status: DC | PRN

## 2022-07-14 MED ORDER — FENTANYL 50 MCG/HR TD PT72: 1.0000 | MEDICATED_PATCH | TRANSDERMAL | 0 refills | Status: DC

## 2022-07-15 ENCOUNTER — Inpatient Hospital Stay: Payer: BC Managed Care – PPO

## 2022-07-15 ENCOUNTER — Encounter: Payer: Self-pay | Admitting: Hematology & Oncology

## 2022-07-15 ENCOUNTER — Encounter: Payer: Self-pay | Admitting: *Deleted

## 2022-07-15 ENCOUNTER — Other Ambulatory Visit: Payer: Self-pay

## 2022-07-15 ENCOUNTER — Inpatient Hospital Stay: Payer: BC Managed Care – PPO | Attending: Hematology & Oncology

## 2022-07-15 ENCOUNTER — Inpatient Hospital Stay (HOSPITAL_BASED_OUTPATIENT_CLINIC_OR_DEPARTMENT_OTHER): Payer: BC Managed Care – PPO | Admitting: Hematology & Oncology

## 2022-07-15 VITALS — BP 138/87 | HR 95 | Temp 98.6°F | Resp 16 | Ht 68.0 in | Wt 179.0 lb

## 2022-07-15 DIAGNOSIS — C50919 Malignant neoplasm of unspecified site of unspecified female breast: Secondary | ICD-10-CM | POA: Insufficient documentation

## 2022-07-15 DIAGNOSIS — C7951 Secondary malignant neoplasm of bone: Secondary | ICD-10-CM | POA: Insufficient documentation

## 2022-07-15 DIAGNOSIS — R978 Other abnormal tumor markers: Secondary | ICD-10-CM | POA: Insufficient documentation

## 2022-07-15 DIAGNOSIS — Z5111 Encounter for antineoplastic chemotherapy: Secondary | ICD-10-CM | POA: Insufficient documentation

## 2022-07-15 DIAGNOSIS — D539 Nutritional anemia, unspecified: Secondary | ICD-10-CM

## 2022-07-15 DIAGNOSIS — Z170 Estrogen receptor positive status [ER+]: Secondary | ICD-10-CM | POA: Diagnosis not present

## 2022-07-15 LAB — CBC WITH DIFFERENTIAL (CANCER CENTER ONLY)
Abs Immature Granulocytes: 0.03 10*3/uL (ref 0.00–0.07)
Basophils Absolute: 0 10*3/uL (ref 0.0–0.1)
Basophils Relative: 2 %
Eosinophils Absolute: 0 10*3/uL (ref 0.0–0.5)
Eosinophils Relative: 2 %
HCT: 29 % — ABNORMAL LOW (ref 36.0–46.0)
Hemoglobin: 9.7 g/dL — ABNORMAL LOW (ref 12.0–15.0)
Immature Granulocytes: 2 %
Lymphocytes Relative: 10 %
Lymphs Abs: 0.2 10*3/uL — ABNORMAL LOW (ref 0.7–4.0)
MCH: 33.6 pg (ref 26.0–34.0)
MCHC: 33.4 g/dL (ref 30.0–36.0)
MCV: 100.3 fL — ABNORMAL HIGH (ref 80.0–100.0)
Monocytes Absolute: 0.1 10*3/uL (ref 0.1–1.0)
Monocytes Relative: 4 %
Neutro Abs: 1.4 10*3/uL — ABNORMAL LOW (ref 1.7–7.7)
Neutrophils Relative %: 80 %
Platelet Count: 139 10*3/uL — ABNORMAL LOW (ref 150–400)
RBC: 2.89 MIL/uL — ABNORMAL LOW (ref 3.87–5.11)
RDW: 15.5 % (ref 11.5–15.5)
WBC Count: 1.7 10*3/uL — ABNORMAL LOW (ref 4.0–10.5)
nRBC: 0 % (ref 0.0–0.2)

## 2022-07-15 LAB — FERRITIN: Ferritin: 480 ng/mL — ABNORMAL HIGH (ref 11–307)

## 2022-07-15 LAB — CMP (CANCER CENTER ONLY)
ALT: 10 U/L (ref 0–44)
AST: 40 U/L (ref 15–41)
Albumin: 4.1 g/dL (ref 3.5–5.0)
Alkaline Phosphatase: 157 U/L — ABNORMAL HIGH (ref 38–126)
Anion gap: 8 (ref 5–15)
BUN: 14 mg/dL (ref 6–20)
CO2: 29 mmol/L (ref 22–32)
Calcium: 9.6 mg/dL (ref 8.9–10.3)
Chloride: 101 mmol/L (ref 98–111)
Creatinine: 0.66 mg/dL (ref 0.44–1.00)
GFR, Estimated: >60 mL/min (ref >60)
Glucose, Bld: 118 mg/dL — ABNORMAL HIGH (ref 70–99)
Potassium: 4 mmol/L (ref 3.5–5.1)
Sodium: 138 mmol/L (ref 135–145)
Total Bilirubin: 0.4 mg/dL (ref 0.3–1.2)
Total Protein: 6.9 g/dL (ref 6.5–8.1)

## 2022-07-15 LAB — IRON AND IRON BINDING CAPACITY (CC-WL,HP ONLY)
Iron: 69 ug/dL (ref 28–170)
Saturation Ratios: 24 % (ref 10.4–31.8)
TIBC: 288 ug/dL (ref 250–450)
UIBC: 219 ug/dL (ref 148–442)

## 2022-07-15 LAB — LACTATE DEHYDROGENASE: LDH: 326 U/L — ABNORMAL HIGH (ref 98–192)

## 2022-07-15 LAB — RETICULOCYTES
Immature Retic Fract: 26.8 % — ABNORMAL HIGH (ref 2.3–15.9)
RBC.: 2.87 MIL/uL — ABNORMAL LOW (ref 3.87–5.11)
Retic Count, Absolute: 59.1 10*3/uL (ref 19.0–186.0)
Retic Ct Pct: 2.1 % (ref 0.4–3.1)

## 2022-07-15 LAB — SAVE SMEAR(SSMR), FOR PROVIDER SLIDE REVIEW

## 2022-07-15 MED ORDER — FULVESTRANT 250 MG/5ML IM SOSY
500.0000 mg | PREFILLED_SYRINGE | Freq: Once | INTRAMUSCULAR | Status: AC
  Administered 2022-07-15: 500 mg via INTRAMUSCULAR
  Filled 2022-07-15: qty 10

## 2022-07-15 MED ORDER — FUSION PLUS PO CAPS: 1.0000 | ORAL_CAPSULE | Freq: Every day | ORAL | 4 refills | Status: DC

## 2022-07-15 NOTE — Progress Notes (Signed)
Hematology and Oncology Follow Up Visit  Annette James [id number] 1961-03-24 61 y.o. 07/15/2022   Principle Diagnosis:  Metastatic breast cancer-bone only metastasis-ER positive/PR positive/HER2 LOW ///  PIK3CA (+)   Current Therapy:        Faslodex 500 mg IM monthly-start on 11/06/2020 Ibrance 125 MG PO Q DAY (21 days on/7 days off) XRT to the RIGHT hip  --completed on 07/24/2021 Zometa 4 mg IV q. 3 months -- next dose on 08/2022 XRT to T12 -- completed on 07/14/2022    Interim History:  Annette James is here today for follow-up.  She completed her radiation therapy to the T12 vertebral body.  She was asymptomatic but I feel that she will benefit from radiotherapy.  She tolerated this pretty well.  She is little bit tired.  She is having some issues with anemia.  When we last saw her, she had a hemoglobin of 9.8.  We did studies on this.  Her iron studies were not as bad as I would have thought.  A ferritin was 203.  Her iron saturation was 17%.  We checked an erythropoietin level on her.  This was 276.  She has had no problems with bleeding.  Her blood counts are down a little bit today.  This is her third week of the [place].  Her birthday is coming up on Friday.  I am sure that she is to have a wonderful time with her family.  I know her family will take very good care of her.  Her appetite is okay.  She has had a little bit of weight loss.  She has had no nausea or vomiting.  There is been no diarrhea.  She has had no rashes.  Her last CA 27.29 was on the higher side at 82.  Overall, I would say performance status is probably ECOG 1.    Medications:  Allergies as of 07/15/2022   No Known Allergies      Medication List        Accurate as of July 15, 2022 10:39 AM. If you have any questions, ask your nurse or doctor.          ascorbic acid 500 MG tablet Commonly known as: VITAMIN C Take 500 mg by mouth daily.   cyclobenzaprine 5 MG  tablet Commonly known as: FLEXERIL TAKE 1 TABLET BY MOUTH THREE TIMES A DAY AS NEEDED FOR MUSCLE SPASMS   fentaNYL 50 MCG/HR Commonly known as: [place] 1 patch onto the skin every other day.   Ibrance 125 MG tablet Generic drug: palbociclib TAKE 1 TABLET ONCE DAILY WITH OR WITHOUT FOOD FOR 21 DAYS ON, FOLLOWED BY 7 DAYS OFF, REPEATED EVERY 28 DAYS   ondansetron 8 MG tablet Commonly known as: Zofran Take 1 tablet (8 mg total) by mouth every 8 (eight) hours as needed for nausea or vomiting.   One-A-Day Womens 50 Plus Tabs Take 1 tablet by mouth daily.   oxyCODONE 5 MG immediate release tablet Commonly known as: Oxy IR/ROXICODONE Take 1-2 tablets (5-10 mg total) by mouth every 4 (four) hours as needed.        Allergies: No Known Allergies  Past Medical History, Surgical history, Social history, and Family History were reviewed and updated.  Review of Systems: Review of Systems  Constitutional: Negative.   HENT: Negative.    Eyes: Negative.   Respiratory: Negative.    Cardiovascular: Negative.   Gastrointestinal: Negative.   Genitourinary: Negative.   Musculoskeletal:  Positive  for joint pain and neck pain.  Skin: Negative.   Neurological: Negative.   Endo/Heme/Allergies: Negative.   Psychiatric/Behavioral: Negative.        Physical Exam:  height is '5\' 8"'  (1.727 m) and weight is 179 lb (81.2 kg). Her oral temperature is 98.6 F (37 C). Her blood pressure is 138/87 and her pulse is 95. Her respiration is 16 and oxygen saturation is 99%.   Wt Readings from Last 3 Encounters:  07/15/22 179 lb (81.2 kg)  06/11/22 187 lb 6.4 oz (85 kg)  06/10/22 187 lb 12.8 oz (85.2 kg)    Physical Exam Vitals reviewed.  HENT:     Head: Normocephalic and atraumatic.  Eyes:     Pupils: Pupils are equal, round, and reactive to light.  Cardiovascular:     Rate and Rhythm: Normal rate and regular rhythm.     Heart sounds: Normal heart sounds.  Pulmonary:     Effort:  Pulmonary effort is normal.     Breath sounds: Normal breath sounds.  Abdominal:     General: Bowel sounds are normal.     Palpations: Abdomen is soft.  Musculoskeletal:        General: No tenderness or deformity. Normal range of motion.     Cervical back: Normal range of motion.  Lymphadenopathy:     Cervical: No cervical adenopathy.  Skin:    General: Skin is warm and dry.     Findings: No erythema or rash.  Neurological:     Mental Status: She is alert and oriented to person, place, and time.  Psychiatric:        Behavior: Behavior normal.        Thought Content: Thought content normal.        Judgment: Judgment normal.      Lab Results  Component Value Date   WBC 1.7 (L) 07/15/2022   HGB 9.7 (L) 07/15/2022   HCT 29.0 (L) 07/15/2022   MCV 100.3 (H) 07/15/2022   PLT 139 (L) 07/15/2022   Lab Results  Component Value Date   FERRITIN 203 06/10/2022   IRON 47 06/10/2022   TIBC 273 06/10/2022   UIBC 226 06/10/2022   IRONPCTSAT 17 06/10/2022   Lab Results  Component Value Date   RETICCTPCT 2.1 07/15/2022   RBC 2.87 (L) 07/15/2022   Lab Results  Component Value Date   KAPLAMBRATIO 4.99 10/20/2020   Lab Results  Component Value Date   IGGSERUM 714 10/19/2020   IGA 153 10/19/2020   IGMSERUM 76 10/19/2020   Lab Results  Component Value Date   TOTALPROTELP 6.1 10/19/2020     Chemistry      Component Value Date/Time   NA 138 07/15/2022 0939   K 4.0 07/15/2022 0939   CL 101 07/15/2022 0939   CO2 29 07/15/2022 0939   BUN 14 07/15/2022 0939   CREATININE 0.66 07/15/2022 0939      Component Value Date/Time   CALCIUM 9.6 07/15/2022 0939   ALKPHOS 157 (H) 07/15/2022 0939   AST 40 07/15/2022 0939   ALT 10 07/15/2022 0939   BILITOT 0.4 07/15/2022 0939       Impression and Plan: Annette James is a very pleasant 61 yo caucasian female with metastatic breast cancer confined at this time to her bones.  She was diagnosed back in December 2021.  So far, she is done  incredibly well on the Faslodex and Ibrance.  We will be very interesting to see what the CA 27.29  looks like.  Hopefully, it is no higher.  With the radiation, I would like to hope that the CA 27.29 is actually lower.  Again, I worry about the anemia.  I would have her take some over-the-counter iron.  We will try her on a FUSION Plus.  I think this would be very reasonable.  We may have to make a switch on her treatment.  We may have to think about getting her on an [place].  There was a recent clinical trial says switching on the CDK4/6 inhibitors also has been effective.  We will have to get her back to see [place] in another month.  At that time, we may actually make a change in her protocol depending on her lab work.    Volanda Napoleon, MD 10/4/202310:39 AM

## 2022-07-15 NOTE — Patient Instructions (Signed)
Fulvestrant injection What is this medication? FULVESTRANT (ful VES trant) blocks the effects of estrogen. It is used to treat breast cancer. This medicine may be used for other purposes; ask your health care provider or pharmacist if you have questions. COMMON BRAND NAME(S): FASLODEX What should I tell my care team before I take this medication? They need to know if you have any of these conditions: bleeding disorders liver disease low blood counts, like low white cell, platelet, or red cell counts an unusual or allergic reaction to fulvestrant, other medicines, foods, dyes, or preservatives pregnant or trying to get pregnant breast-feeding How should I use this medication? This medicine is for injection into a muscle. It is usually given by a health care professional in a hospital or clinic setting. Talk to your pediatrician regarding the use of this medicine in children. Special care may be needed. Overdosage: If you think you have taken too much of this medicine contact a poison control center or emergency room at once. NOTE: This medicine is only for you. Do not share this medicine with others. What if I miss a dose? It is important not to miss your dose. Call your doctor or health care professional if you are unable to keep an appointment. What may interact with this medication? medicines that treat or prevent blood clots like warfarin, enoxaparin, dalteparin, apixaban, dabigatran, and rivaroxaban This list may not describe all possible interactions. Give your health care provider a list of all the medicines, herbs, non-prescription drugs, or dietary supplements you use. Also tell them if you smoke, drink alcohol, or use illegal drugs. Some items may interact with your medicine. What should I watch for while using this medication? Your condition will be monitored carefully while you are receiving this medicine. You will need important blood work done while you are taking this  medicine. Do not become pregnant while taking this medicine or for at least 1 year after stopping it. Women of child-bearing potential will need to have a negative pregnancy test before starting this medicine. Women should inform their doctor if they wish to become pregnant or think they might be pregnant. There is a potential for serious side effects to an unborn child. Men should inform their doctors if they wish to father a child. This medicine may lower sperm counts. Talk to your health care professional or pharmacist for more information. Do not breast-feed an infant while taking this medicine or for 1 year after the last dose. What side effects may I notice from receiving this medication? Side effects that you should report to your doctor or health care professional as soon as possible: allergic reactions like skin rash, itching or hives, swelling of the face, lips, or tongue feeling faint or lightheaded, falls pain, tingling, numbness, or weakness in the legs signs and symptoms of infection like fever or chills; cough; flu-like symptoms; sore throat vaginal bleeding Side effects that usually do not require medical attention (report to your doctor or health care professional if they continue or are bothersome): aches, pains constipation diarrhea headache hot flashes nausea, vomiting pain at site where injected stomach pain This list may not describe all possible side effects. Call your doctor for medical advice about side effects. You may report side effects to FDA at 1-800-FDA-1088. Where should I keep my medication? This drug is given in a hospital or clinic and will not be stored at home. NOTE: This sheet is a summary. It may not cover all possible information. If you have   questions about this medicine, talk to your doctor, pharmacist, or health care provider.  2023 Elsevier/Gold Standard (2018-01-11 00:00:00) Zoledronic Acid Injection (Cancer) What is this medication? ZOLEDRONIC  ACID (ZOE le dron ik AS id) treats high calcium levels in the blood caused by cancer. It may also be used with chemotherapy to treat weakened bones caused by cancer. It works by slowing down the release of calcium from bones. This lowers calcium levels in your blood. It also makes your bones stronger and less likely to break (fracture). It belongs to a group of medications called bisphosphonates. This medicine may be used for other purposes; ask your health care provider or pharmacist if you have questions. COMMON BRAND NAME(S): Zometa, Zometa Powder What should I tell my care team before I take this medication? They need to know if you have any of these conditions: Dehydration Dental disease Kidney disease Liver disease Low levels of calcium in the blood Lung or breathing disease, such as asthma Receiving steroids, such as dexamethasone or prednisone An unusual or allergic reaction to zoledronic acid, other medications, foods, dyes, or preservatives Pregnant or trying to get pregnant Breast-feeding How should I use this medication? This medication is injected into a vein. It is given by your care team in a hospital or clinic setting. Talk to your care team about the use of this medication in children. Special care may be needed. Overdosage: If you think you have taken too much of this medicine contact a poison control center or emergency room at once. NOTE: This medicine is only for you. Do not share this medicine with others. What if I miss a dose? Keep appointments for follow-up doses. It is important not to miss your dose. Call your care team if you are unable to keep an appointment. What may interact with this medication? Certain antibiotics given by injection Diuretics, such as bumetanide, furosemide NSAIDs, medications for pain and inflammation, such as ibuprofen or naproxen Teriparatide Thalidomide This list may not describe all possible interactions. Give your health care  provider a list of all the medicines, herbs, non-prescription drugs, or dietary supplements you use. Also tell them if you smoke, drink alcohol, or use illegal drugs. Some items may interact with your medicine. What should I watch for while using this medication? Visit your care team for regular checks on your progress. It may be some time before you see the benefit from this medication. Some people who take this medication have severe bone, joint, or muscle pain. This medication may also increase your risk for jaw problems or a broken thigh bone. Tell your care team right away if you have severe pain in your jaw, bones, joints, or muscles. Tell you care team if you have any pain that does not go away or that gets worse. Tell your dentist and dental surgeon that you are taking this medication. You should not have major dental surgery while on this medication. See your dentist to have a dental exam and fix any dental problems before starting this medication. Take good care of your teeth while on this medication. Make sure you see your dentist for regular follow-up appointments. You should make sure you get enough calcium and vitamin D while you are taking this medication. Discuss the foods you eat and the vitamins you take with your care team. Check with your care team if you have severe diarrhea, nausea, and vomiting, or if you sweat a lot. The loss of too much body fluid may make it dangerous   for you to take this medication. You may need bloodwork while taking this medication. Talk to your care team if you wish to become pregnant or think you might be pregnant. This medication can cause serious birth defects. What side effects may I notice from receiving this medication? Side effects that you should report to your care team as soon as possible: Allergic reactions--skin rash, itching, hives, swelling of the face, lips, tongue, or throat Kidney injury--decrease in the amount of urine, swelling of the  ankles, hands, or feet Low calcium level--muscle pain or cramps, confusion, tingling, or numbness in the hands or feet Osteonecrosis of the jaw--pain, swelling, or redness in the mouth, numbness of the jaw, poor healing after dental work, unusual discharge from the mouth, visible bones in the mouth Severe bone, joint, or muscle pain Side effects that usually do not require medical attention (report to your care team if they continue or are bothersome): Constipation Fatigue Fever Loss of appetite Nausea Stomach pain This list may not describe all possible side effects. Call your doctor for medical advice about side effects. You may report side effects to FDA at 1-800-FDA-1088. Where should I keep my medication? This medication is given in a hospital or clinic. It will not be stored at home. NOTE: This sheet is a summary. It may not cover all possible information. If you have questions about this medicine, talk to your doctor, pharmacist, or health care provider.  2023 Elsevier/Gold Standard (2021-11-21 00:00:00)  

## 2022-07-16 ENCOUNTER — Encounter: Payer: Self-pay | Admitting: Hematology & Oncology

## 2022-07-16 LAB — CANCER ANTIGEN 27.29: CA 27.29: 121.1 U/mL — ABNORMAL HIGH (ref 0.0–38.6)

## 2022-07-16 NOTE — Progress Notes (Signed)
Oncology Nurse Navigator Documentation     07/15/2022   10:30 AM  Oncology Nurse Navigator Flowsheets  Navigator Follow Up Date: 08/12/2022  Navigator Follow Up Reason: Follow-up Appointment  Navigator Location [facility]  Navigator Encounter Type Appt/Treatment Plan Review  Patient Visit Type MedOnc  Treatment Phase Active Tx  Barriers/Navigation Needs No Barriers At This Time  Interventions None Required  Acuity Level 1-No Barriers  Support Groups/Services Friends and Family  Time Spent with Patient 15

## 2022-07-20 ENCOUNTER — Encounter: Payer: Self-pay | Admitting: *Deleted

## 2022-07-20 ENCOUNTER — Other Ambulatory Visit: Payer: Self-pay

## 2022-07-20 DIAGNOSIS — C50919 Malignant neoplasm of unspecified site of unspecified female breast: Secondary | ICD-10-CM

## 2022-07-20 MED ORDER — PROCHLORPERAZINE MALEATE 10 MG PO TABS: 10.0000 mg | ORAL_TABLET | Freq: Four times a day (QID) | ORAL | 0 refills | Status: DC | PRN

## 2022-07-20 NOTE — Telephone Encounter (Signed)
Called and spoke with patient regarding hr mychart message. She states she has been feeling sick since Friday. Started ehr iron tablets that day and also took them satruday, did not take them Sunday or today and is still getting sick. She is taking zofran with no relief. She denies wanting to come in to office at this time as she live sin [place] and has no ride right now. She is drinking fluids and eating some crackers and will call if it worsens. Discussed with Marshell Garfinkel and sent in rx for compazine to her pharmacy in [place] per pt request. She verbalized  all understanding and knows to call if any changes.

## 2022-08-05 ENCOUNTER — Other Ambulatory Visit: Payer: Self-pay | Admitting: Pharmacist

## 2022-08-11 ENCOUNTER — Other Ambulatory Visit: Payer: Self-pay | Admitting: Hematology & Oncology

## 2022-08-11 ENCOUNTER — Other Ambulatory Visit (HOSPITAL_BASED_OUTPATIENT_CLINIC_OR_DEPARTMENT_OTHER): Payer: Self-pay

## 2022-08-11 DIAGNOSIS — C50919 Malignant neoplasm of unspecified site of unspecified female breast: Secondary | ICD-10-CM

## 2022-08-12 ENCOUNTER — Other Ambulatory Visit (HOSPITAL_COMMUNITY): Payer: Self-pay

## 2022-08-12 ENCOUNTER — Encounter: Payer: Self-pay | Admitting: *Deleted

## 2022-08-12 ENCOUNTER — Encounter: Payer: Self-pay | Admitting: Radiation Oncology

## 2022-08-12 ENCOUNTER — Inpatient Hospital Stay: Payer: BC Managed Care – PPO

## 2022-08-12 ENCOUNTER — Inpatient Hospital Stay (HOSPITAL_BASED_OUTPATIENT_CLINIC_OR_DEPARTMENT_OTHER): Payer: BC Managed Care – PPO | Admitting: Hematology & Oncology

## 2022-08-12 ENCOUNTER — Encounter: Payer: Self-pay | Admitting: Hematology & Oncology

## 2022-08-12 ENCOUNTER — Other Ambulatory Visit (HOSPITAL_BASED_OUTPATIENT_CLINIC_OR_DEPARTMENT_OTHER): Payer: Self-pay

## 2022-08-12 ENCOUNTER — Inpatient Hospital Stay: Payer: BC Managed Care – PPO | Attending: Hematology & Oncology

## 2022-08-12 VITALS — BP 125/69 | HR 82 | Temp 98.2°F | Resp 18

## 2022-08-12 VITALS — BP 130/91 | HR 104 | Temp 98.1°F | Resp 20 | Ht 68.0 in | Wt 171.1 lb

## 2022-08-12 DIAGNOSIS — D500 Iron deficiency anemia secondary to blood loss (chronic): Secondary | ICD-10-CM | POA: Diagnosis not present

## 2022-08-12 DIAGNOSIS — D539 Nutritional anemia, unspecified: Secondary | ICD-10-CM | POA: Diagnosis not present

## 2022-08-12 DIAGNOSIS — Z5111 Encounter for antineoplastic chemotherapy: Secondary | ICD-10-CM | POA: Diagnosis not present

## 2022-08-12 DIAGNOSIS — D649 Anemia, unspecified: Secondary | ICD-10-CM | POA: Insufficient documentation

## 2022-08-12 DIAGNOSIS — Z923 Personal history of irradiation: Secondary | ICD-10-CM | POA: Insufficient documentation

## 2022-08-12 DIAGNOSIS — C50919 Malignant neoplasm of unspecified site of unspecified female breast: Secondary | ICD-10-CM

## 2022-08-12 DIAGNOSIS — C7951 Secondary malignant neoplasm of bone: Secondary | ICD-10-CM | POA: Diagnosis not present

## 2022-08-12 DIAGNOSIS — Z170 Estrogen receptor positive status [ER+]: Secondary | ICD-10-CM | POA: Insufficient documentation

## 2022-08-12 LAB — CBC WITH DIFFERENTIAL (CANCER CENTER ONLY)
Abs Immature Granulocytes: 0.02 10*3/uL (ref 0.00–0.07)
Basophils Absolute: 0 10*3/uL (ref 0.0–0.1)
Basophils Relative: 2 %
Eosinophils Absolute: 0 10*3/uL (ref 0.0–0.5)
Eosinophils Relative: 1 %
HCT: 25.4 % — ABNORMAL LOW (ref 36.0–46.0)
Hemoglobin: 8.4 g/dL — ABNORMAL LOW (ref 12.0–15.0)
Immature Granulocytes: 2 %
Lymphocytes Relative: 11 %
Lymphs Abs: 0.2 10*3/uL — ABNORMAL LOW (ref 0.7–4.0)
MCH: 33.3 pg (ref 26.0–34.0)
MCHC: 33.1 g/dL (ref 30.0–36.0)
MCV: 100.8 fL — ABNORMAL HIGH (ref 80.0–100.0)
Monocytes Absolute: 0.1 10*3/uL (ref 0.1–1.0)
Monocytes Relative: 5 %
Neutro Abs: 1.1 10*3/uL — ABNORMAL LOW (ref 1.7–7.7)
Neutrophils Relative %: 79 %
Platelet Count: 111 10*3/uL — ABNORMAL LOW (ref 150–400)
RBC: 2.52 MIL/uL — ABNORMAL LOW (ref 3.87–5.11)
RDW: 15.5 % (ref 11.5–15.5)
WBC Count: 1.4 10*3/uL — ABNORMAL LOW (ref 4.0–10.5)
nRBC: 4.4 % — ABNORMAL HIGH (ref 0.0–0.2)

## 2022-08-12 LAB — CMP (CANCER CENTER ONLY)
ALT: 6 U/L (ref 0–44)
AST: 31 U/L (ref 15–41)
Albumin: 3.9 g/dL (ref 3.5–5.0)
Alkaline Phosphatase: 214 U/L — ABNORMAL HIGH (ref 38–126)
Anion gap: 11 (ref 5–15)
BUN: 14 mg/dL (ref 6–20)
CO2: 25 mmol/L (ref 22–32)
Calcium: 9.4 mg/dL (ref 8.9–10.3)
Chloride: 102 mmol/L (ref 98–111)
Creatinine: 0.74 mg/dL (ref 0.44–1.00)
GFR, Estimated: >60 mL/min (ref >60)
Glucose, Bld: 110 mg/dL — ABNORMAL HIGH (ref 70–99)
Potassium: 3.7 mmol/L (ref 3.5–5.1)
Sodium: 138 mmol/L (ref 135–145)
Total Bilirubin: 0.6 mg/dL (ref 0.3–1.2)
Total Protein: 6.9 g/dL (ref 6.5–8.1)

## 2022-08-12 LAB — RETICULOCYTES
Immature Retic Fract: 28.1 % — ABNORMAL HIGH (ref 2.3–15.9)
RBC.: 2.49 MIL/uL — ABNORMAL LOW (ref 3.87–5.11)
Retic Count, Absolute: 70.5 10*3/uL (ref 19.0–186.0)
Retic Ct Pct: 2.8 % (ref 0.4–3.1)

## 2022-08-12 LAB — IRON AND IRON BINDING CAPACITY (CC-WL,HP ONLY)
Iron: 110 ug/dL (ref 28–170)
Saturation Ratios: 36 % — ABNORMAL HIGH (ref 10.4–31.8)
TIBC: 304 ug/dL (ref 250–450)
UIBC: 194 ug/dL (ref 148–442)

## 2022-08-12 LAB — PREPARE RBC (CROSSMATCH)

## 2022-08-12 LAB — ABO/RH: ABO/RH(D): O POS

## 2022-08-12 LAB — FERRITIN: Ferritin: 816 ng/mL — ABNORMAL HIGH (ref 11–307)

## 2022-08-12 MED ORDER — ZOLEDRONIC ACID 4 MG/100ML IV SOLN
4.0000 mg | Freq: Once | INTRAVENOUS | Status: AC
  Administered 2022-08-12: 4 mg via INTRAVENOUS
  Filled 2022-08-12: qty 100

## 2022-08-12 MED ORDER — ACETAMINOPHEN 325 MG PO TABS
650.0000 mg | ORAL_TABLET | Freq: Once | ORAL | Status: AC
  Administered 2022-08-12: 650 mg via ORAL
  Filled 2022-08-12: qty 2

## 2022-08-12 MED ORDER — PIQRAY (300 MG DAILY DOSE) 2 X 150 MG PO TBPK
300.0000 mg | ORAL_TABLET | Freq: Every day | ORAL | 0 refills | Status: DC
  Filled 2022-08-12: qty 56, 28d supply, fill #0

## 2022-08-12 MED ORDER — FUROSEMIDE 10 MG/ML IJ SOLN
20.0000 mg | Freq: Once | INTRAMUSCULAR | Status: AC
  Administered 2022-08-12: 20 mg via INTRAVENOUS
  Filled 2022-08-12 (×2): qty 4

## 2022-08-12 MED ORDER — SODIUM CHLORIDE 0.9% IV SOLUTION
250.0000 mL | Freq: Once | INTRAVENOUS | Status: AC
  Administered 2022-08-12: 250 mL via INTRAVENOUS

## 2022-08-12 MED ORDER — DIPHENHYDRAMINE HCL 25 MG PO CAPS
25.0000 mg | ORAL_CAPSULE | Freq: Once | ORAL | Status: AC
  Administered 2022-08-12: 25 mg via ORAL
  Filled 2022-08-12: qty 1

## 2022-08-12 MED ORDER — FUROSEMIDE 10 MG/ML IJ SOLN
20.0000 mg | Freq: Once | INTRAMUSCULAR | Status: AC
  Administered 2022-08-12: 20 mg via INTRAVENOUS
  Filled 2022-08-12: qty 4

## 2022-08-12 MED ORDER — SODIUM CHLORIDE 0.9 % IV SOLN: INTRAVENOUS | Status: DC

## 2022-08-12 MED ORDER — FULVESTRANT 250 MG/5ML IM SOSY
500.0000 mg | PREFILLED_SYRINGE | Freq: Once | INTRAMUSCULAR | Status: AC
  Administered 2022-08-12: 500 mg via INTRAMUSCULAR
  Filled 2022-08-12: qty 10

## 2022-08-12 MED ORDER — OXYCODONE HCL 5 MG PO TABS
5.0000 mg | ORAL_TABLET | ORAL | 0 refills | Status: DC | PRN
  Filled 2022-08-12: qty 60, 5d supply, fill #0

## 2022-08-12 NOTE — Patient Instructions (Signed)
Blood Transfusion, Adult, Care After The following information offers guidance on how to care for yourself after your procedure. Your health care provider may also give you more specific instructions. If you have problems or questions, contact your health care provider. What can I expect after the procedure? After the procedure, it is common to have: Bruising and soreness where the IV was inserted. A headache. Follow these instructions at home: IV insertion site care     Follow instructions from your health care provider about how to take care of your IV insertion site. Make sure you: Wash your hands with soap and water for at least 20 seconds before and after you change your bandage (dressing). If soap and water are not available, use hand sanitizer. Change your dressing as told by your health care provider. Check your IV insertion site every day for signs of infection. Check for: Redness, swelling, or pain. Bleeding from the site. Warmth. Pus or a bad smell. General instructions Take over-the-counter and prescription medicines only as told by your health care provider. Rest as told by your health care provider. Return to your normal activities as told by your health care provider. Keep all follow-up visits. Lab tests may need to be done at certain periods to recheck your blood counts. Contact a health care provider if: You have itching or red, swollen areas of skin (hives). You have a fever or chills. You have pain in the head, back, or chest. You feel anxious or you feel weak after doing your normal activities. You have redness, swelling, warmth, or pain around the IV insertion site. You have blood coming from the IV insertion site that does not stop with pressure. You have pus or a bad smell coming from your IV insertion site. If you received your blood transfusion in an outpatient setting, you will be told whom to contact to report any reactions. Get help right away if: You  have symptoms of a serious allergic or immune system reaction, including: Trouble breathing or shortness of breath. Swelling of the face, feeling flushed, or widespread rash. Dark urine or blood in the urine. Fast heartbeat. These symptoms may be an emergency. Get help right away. Call 911. Do not wait to see if the symptoms will go away. Do not drive yourself to the hospital. Summary Bruising and soreness around the IV insertion site are common. Check your IV insertion site every day for signs of infection. Rest as told by your health care provider. Return to your normal activities as told by your health care provider. Get help right away for symptoms of a serious allergic or immune system reaction to the blood transfusion. This information is not intended to replace advice given to you by your health care provider. Make sure you discuss any questions you have with your health care provider. Document Revised: 12/26/2021 Document Reviewed: 12/26/2021 Elsevier Patient Education  [place].  Zoledronic Acid Injection (Cancer) What is this medication? ZOLEDRONIC ACID (ZOE le dron ik AS id) treats high calcium levels in the blood caused by cancer. It may also be used with chemotherapy to treat weakened bones caused by cancer. It works by slowing down the release of calcium from bones. This lowers calcium levels in your blood. It also makes your bones stronger and less likely to break (fracture). It belongs to a group of medications called bisphosphonates. This medicine may be used for other purposes; ask your health care provider or pharmacist if you have questions. COMMON BRAND NAME(S):  Zometa, Zometa Powder What should I tell my care team before I take this medication? They need to know if you have any of these conditions: Dehydration Dental disease Kidney disease Liver disease Low levels of calcium in the blood Lung or breathing disease, such as asthma Receiving steroids, such  as dexamethasone or prednisone An unusual or allergic reaction to zoledronic acid, other medications, foods, dyes, or preservatives Pregnant or trying to get pregnant Breast-feeding How should I use this medication? This medication is injected into a vein. It is given by your care team in a hospital or clinic setting. Talk to your care team about the use of this medication in children. Special care may be needed. Overdosage: If you think you have taken too much of this medicine contact a poison control center or emergency room at once. NOTE: This medicine is only for you. Do not share this medicine with others. What if I miss a dose? Keep appointments for follow-up doses. It is important not to miss your dose. Call your care team if you are unable to keep an appointment. What may interact with this medication? Certain antibiotics given by injection Diuretics, such as bumetanide, furosemide NSAIDs, medications for pain and inflammation, such as ibuprofen or naproxen Teriparatide Thalidomide This list may not describe all possible interactions. Give your health care provider a list of all the medicines, herbs, non-prescription drugs, or dietary supplements you use. Also tell them if you smoke, drink alcohol, or use illegal drugs. Some items may interact with your medicine. What should I watch for while using this medication? Visit your care team for regular checks on your progress. It may be some time before you see the benefit from this medication. Some people who take this medication have severe bone, joint, or muscle pain. This medication may also increase your risk for jaw problems or a broken thigh bone. Tell your care team right away if you have severe pain in your jaw, bones, joints, or muscles. Tell you care team if you have any pain that does not go away or that gets worse. Tell your dentist and dental surgeon that you are taking this medication. You should not have major dental surgery  while on this medication. See your dentist to have a dental exam and fix any dental problems before starting this medication. Take good care of your teeth while on this medication. Make sure you see your dentist for regular follow-up appointments. You should make sure you get enough calcium and vitamin D while you are taking this medication. Discuss the foods you eat and the vitamins you take with your care team. Check with your care team if you have severe diarrhea, nausea, and vomiting, or if you sweat a lot. The loss of too much body fluid may make it dangerous for you to take this medication. You may need bloodwork while taking this medication. Talk to your care team if you wish to become pregnant or think you might be pregnant. This medication can cause serious birth defects. What side effects may I notice from receiving this medication? Side effects that you should report to your care team as soon as possible: Allergic reactions--skin rash, itching, hives, swelling of the face, lips, tongue, or throat Kidney injury--decrease in the amount of urine, swelling of the ankles, hands, or feet Low calcium level--muscle pain or cramps, confusion, tingling, or numbness in the hands or feet Osteonecrosis of the jaw--pain, swelling, or redness in the mouth, numbness of the jaw, poor healing  after dental work, unusual discharge from the mouth, visible bones in the mouth Severe bone, joint, or muscle pain Side effects that usually do not require medical attention (report to your care team if they continue or are bothersome): Constipation Fatigue Fever Loss of appetite Nausea Stomach pain This list may not describe all possible side effects. Call your doctor for medical advice about side effects. You may report side effects to FDA at 1-800-FDA-1088. Where should I keep my medication? This medication is given in a hospital or clinic. It will not be stored at home. NOTE: This sheet is a summary. It may  not cover all possible information. If you have questions about this medicine, talk to your doctor, pharmacist, or health care provider.  2023 Elsevier/Gold Standard (2021-11-13 00:00:00)

## 2022-08-12 NOTE — Progress Notes (Signed)
Hematology and Oncology Follow Up Visit  Annette James [id number] Jul 22, 1961 61 y.o. 08/12/2022   Principle Diagnosis:  Metastatic breast cancer-bone only metastasis-ER positive/PR positive/HER2 LOW ///  PIK3CA (+)   Current Therapy:        Faslodex 500 mg IM monthly-start on 11/06/2020 Ibrance 125 MG PO Q DAY (21 days on/7 days off) - d/c on 08/12/2022 XRT to the RIGHT hip  --completed on 07/24/2021 Zometa 4 mg IV q. 3 months -- next dose on 08/2022 XRT to T12 -- completed on 07/14/2022    Interim History:  Annette James is here today for follow-up.  Unfortunately, I think that we were having more trouble now.  She is more fatigued and weak.  Her hemoglobin is clearly lower.  Her hemoglobin is now 8.4.  We checked anemia studies more last saw her.  Surprising, everything looked okay.  Vitamin B-12 was over 1500.  She really was not iron deficient.  Her iron studies showed a ferritin of 816 with an iron saturation 36%.  I gave her some oral iron which she could not tolerate.  She began to have vomiting after taking 1 dose.  Her CA 27.29 has been going up.  It is 121.  This was back in early October.  I think regarding clearly have to make a change in her protocol.  I am unsure if the marked anemia is from the [place].  I have a looked at her blood smear.  I really was not that impressed.  I do not see any obvious changes that would suggest bone marrow invasion by the breast cancer.  I think that we probably should consider getting her a PET scan to see exactly what is going on.  I think we will going to have to switch out the [place] and consider her for [place].  I think this would be the next option for [place].  We are going to have to transfuse her blood today.  I talked her about the blood transfusion.  She has never had 1 before.  I told her that it was relatively safe.  We do not worry about HIV or hepatitis.  The blood is checked.  Again, I just do not sure as to why she has  this progressive anemia.  I do believe that her CA 27-29 will be higher.  Of note, I did do a rectal exam on her today.  I really do not find any blood in her stool.  Regardless, I did give her some stool cards to take at home.  I know that she completed radiation therapy to T12 back in early October.  I would not think that this would be the source of the anemia.  Her appetite is okay.  She did have a nice birthday on 07/17/2020.  She has had no nausea or vomiting.  She has had a decent appetite.  She has had no cough.  She is still working.  She is quite busy at work.  I really want her to have a good Thanksgiving.  She is spends Thanksgiving with one of her daughters.  Overall, I would say performance status is probably ECOG 1.   Medications:  Allergies as of 08/12/2022   No Known Allergies      Medication List        Accurate as of August 12, 2022  8:01 AM. If you have any questions, ask your nurse or doctor.          ascorbic  acid 500 MG tablet Commonly known as: VITAMIN C Take 500 mg by mouth daily.   cyclobenzaprine 5 MG tablet Commonly known as: FLEXERIL TAKE 1 TABLET BY MOUTH THREE TIMES A DAY AS NEEDED FOR MUSCLE SPASMS   fentaNYL 50 MCG/HR Commonly known as: [place] 1 patch onto the skin every other day.   Fusion Plus Caps Take 1 capsule by mouth daily after breakfast.   Ibrance 125 MG tablet Generic drug: palbociclib TAKE 1 TABLET ONCE DAILY WITH OR WITHOUT FOOD FOR 21 DAYS ON, FOLLOWED BY 7 DAYS OFF, REPEATED EVERY 28 DAYS   ondansetron 8 MG tablet Commonly known as: Zofran Take 1 tablet (8 mg total) by mouth every 8 (eight) hours as needed for nausea or vomiting.   One-A-Day Womens 50 Plus Tabs Take 1 tablet by mouth daily.   oxyCODONE 5 MG immediate release tablet Commonly known as: Oxy IR/ROXICODONE Take 1-2 tablets (5-10 mg total) by mouth every 4 (four) hours as needed.   prochlorperazine 10 MG tablet Commonly known as:  COMPAZINE Take 1 tablet (10 mg total) by mouth every 6 (six) hours as needed for nausea or vomiting.        Allergies: No Known Allergies  Past Medical History, Surgical history, Social history, and Family History were reviewed and updated.  Review of Systems: Review of Systems  Constitutional: Negative.   HENT: Negative.    Eyes: Negative.   Respiratory: Negative.    Cardiovascular: Negative.   Gastrointestinal: Negative.   Genitourinary: Negative.   Musculoskeletal:  Positive for joint pain and neck pain.  Skin: Negative.   Neurological: Negative.   Endo/Heme/Allergies: Negative.   Psychiatric/Behavioral: Negative.        Physical Exam:  height is _0  (1.727 m) and weight is 171 lb 1.9 oz (77.6 kg). Her oral temperature is 98.1 F (36.7 C). Her blood pressure is 130/91 (abnormal) and her pulse is 104 (abnormal). Her respiration is 20 and oxygen saturation is 100%.   Wt Readings from Last 3 Encounters:  08/12/22 171 lb 1.9 oz (77.6 kg)  07/15/22 179 lb (81.2 kg)  06/11/22 187 lb 6.4 oz (85 kg)    Physical Exam Vitals reviewed.  HENT:     Head: Normocephalic and atraumatic.  Eyes:     Pupils: Pupils are equal, round, and reactive to light.  Cardiovascular:     Rate and Rhythm: Normal rate and regular rhythm.     Heart sounds: Normal heart sounds.  Pulmonary:     Effort: Pulmonary effort is normal.     Breath sounds: Normal breath sounds.  Abdominal:     General: Bowel sounds are normal.     Palpations: Abdomen is soft.  Musculoskeletal:        General: No tenderness or deformity. Normal range of motion.     Cervical back: Normal range of motion.  Lymphadenopathy:     Cervical: No cervical adenopathy.  Skin:    General: Skin is warm and dry.     Findings: No erythema or rash.  Neurological:     Mental Status: She is alert and oriented to person, place, and time.  Psychiatric:        Behavior: Behavior normal.        Thought Content: Thought content  normal.        Judgment: Judgment normal.     Lab Results  Component Value Date   WBC 1.7 (L) 07/15/2022   HGB 9.7 (L) 07/15/2022   HCT  29.0 (L) 07/15/2022   MCV 100.3 (H) 07/15/2022   PLT 139 (L) 07/15/2022   Lab Results  Component Value Date   FERRITIN 480 (H) 07/15/2022   IRON 69 07/15/2022   TIBC 288 07/15/2022   UIBC 219 07/15/2022   IRONPCTSAT 24 07/15/2022   Lab Results  Component Value Date   RETICCTPCT 2.8 08/12/2022   RBC 2.49 (L) 08/12/2022   Lab Results  Component Value Date   KAPLAMBRATIO 4.99 10/20/2020   Lab Results  Component Value Date   IGGSERUM 714 10/19/2020   IGA 153 10/19/2020   IGMSERUM 76 10/19/2020   Lab Results  Component Value Date   TOTALPROTELP 6.1 10/19/2020     Chemistry      Component Value Date/Time   NA 138 07/15/2022 0939   K 4.0 07/15/2022 0939   CL 101 07/15/2022 0939   CO2 29 07/15/2022 0939   BUN 14 07/15/2022 0939   CREATININE 0.66 07/15/2022 0939      Component Value Date/Time   CALCIUM 9.6 07/15/2022 0939   ALKPHOS 157 (H) 07/15/2022 0939   AST 40 07/15/2022 0939   ALT 10 07/15/2022 0939   BILITOT 0.4 07/15/2022 0939       Impression and Plan: Ms. Bellissimo is a very pleasant 61 yo caucasian female with metastatic breast cancer confined at this time to her bones.  She was diagnosed back in December 2021.  So far, she is done incredibly well on the Faslodex and Ibrance.  Again, I think that we are losing the effectiveness of the Ibrance.  We will switch her over to [facility].  We will do Piqray along with Faslodex.  I did send her home with some stool cards.  I think this will be also very important to see if there is any bleeding that might be causing the anemia.  We will have to see about a PET scan for her.  The last PET scan was done back in August.  We will get have to follow her closely.  I did talk to her about the side effects of the [place].  I told about the issues with high blood sugars.  I told the  possibility of diarrhea.  I would not think that her blood counts would be affected all that much.  We will go ahead to get her back in a few weeks.  This is quite complicated.  I just hate that she has this anemia.     Volanda Napoleon, MD 11/1/20238:01 AM

## 2022-08-12 NOTE — Telephone Encounter (Signed)
Last refilled 07/14/22 #60. Please advise, thanks.

## 2022-08-13 ENCOUNTER — Encounter: Payer: Self-pay | Admitting: Hematology & Oncology

## 2022-08-13 ENCOUNTER — Other Ambulatory Visit (HOSPITAL_COMMUNITY): Payer: Self-pay

## 2022-08-13 ENCOUNTER — Telehealth: Payer: Self-pay

## 2022-08-13 ENCOUNTER — Telehealth: Payer: Self-pay | Admitting: Pharmacist

## 2022-08-13 DIAGNOSIS — C50919 Malignant neoplasm of unspecified site of unspecified female breast: Secondary | ICD-10-CM

## 2022-08-13 LAB — TYPE AND SCREEN
ABO/RH(D): O POS
Antibody Screen: NEGATIVE
Unit division: 0
Unit division: 0

## 2022-08-13 LAB — BPAM RBC
Blood Product Expiration Date: 202312012359
Blood Product Expiration Date: 202312022359
ISSUE DATE / TIME: 202311011030
ISSUE DATE / TIME: 202311011030
Unit Type and Rh: 5100
Unit Type and Rh: 5100

## 2022-08-13 LAB — CANCER ANTIGEN 27.29: CA 27.29: 146.7 U/mL — ABNORMAL HIGH (ref 0.0–38.6)

## 2022-08-13 MED ORDER — PIQRAY (300 MG DAILY DOSE) 2 X 150 MG PO TBPK: 300.0000 mg | ORAL_TABLET | Freq: Every day | ORAL | 0 refills | Status: DC

## 2022-08-13 NOTE — Telephone Encounter (Signed)
Oral Oncology Patient Advocate Encounter   Received notification that prior authorization for Piqray is required.   PA submitted on 11.02.23  Key [id number]  Status is pending     [name], [place] Patient [place]  [phone] (phone) [phone] (fax) 08/13/2022 8:46 AM

## 2022-08-13 NOTE — Telephone Encounter (Addendum)
Oral Oncology Pharmacist Encounter  Received new prescription for Piqray (alpelisib) for the treatment of metastatic HR-positive, HER-2 negative, PIK3CA mutated breast cancer in conjunction with Faslodex, planned duration until disease progression or unacceptable drug toxicity.  CMP and CBC w/ DIff from 08/12/22 assessed, pt with hgb of 8.4 g/dL - pt transfused with 2 units PRBC on 08/12/22. WBC 1.4 K/uL (ANC 1100/uL). Patient with baseline glucose of 110 mg/dL. Prescription dose and frequency assessed for appropriateness. Patient will need baseline HbA1c prior to starting [place] and then every 3 months thereafter.      Current medication list in Epic reviewed, no relevant/significant DDIs with Piqray identified.  Evaluated chart and no patient barriers to medication adherence noted.   Patient's insurance requires that [place] be filled through [facility] - prescription redirected for dispensing.   Oral Oncology Clinic will continue to follow for insurance authorization, copayment issues, initial counseling and start date.  Leron Croak, PharmD, BCPS, [facility] Hematology/Oncology Clinical Pharmacist [facility] and [place] [phone] 08/13/2022 8:35 AM

## 2022-08-13 NOTE — Telephone Encounter (Signed)
Oral Oncology Patient Advocate Encounter  Prior Authorization for Annette James has been approved.    PA# [id number]  Effective dates: 10.03.23 through 11.01.24  Patient must fill through [facility].  Rx sent to [facility].   Patient to call [employer] at [phone] to sign up for co-pay card as required per insurance rep.   [name], [facility] Patient [place]  [phone] (phone) [phone] (fax) 08/13/2022 9:57 AM

## 2022-08-14 ENCOUNTER — Encounter: Payer: Self-pay | Admitting: Hematology & Oncology

## 2022-08-14 ENCOUNTER — Telehealth: Payer: Self-pay | Admitting: *Deleted

## 2022-08-14 ENCOUNTER — Other Ambulatory Visit: Payer: Self-pay | Admitting: *Deleted

## 2022-08-14 NOTE — Telephone Encounter (Signed)
RETURNED PATIENT'S PHONE CALL, SPOKE WITH PATIENT. ?

## 2022-08-17 ENCOUNTER — Encounter: Payer: Self-pay | Admitting: Hematology & Oncology

## 2022-08-17 ENCOUNTER — Other Ambulatory Visit: Payer: Self-pay | Admitting: *Deleted

## 2022-08-17 ENCOUNTER — Telehealth: Payer: Self-pay | Admitting: Pharmacist

## 2022-08-17 ENCOUNTER — Ambulatory Visit: Payer: BC Managed Care – PPO | Admitting: Radiation Oncology

## 2022-08-17 DIAGNOSIS — C50919 Malignant neoplasm of unspecified site of unspecified female breast: Secondary | ICD-10-CM

## 2022-08-17 NOTE — Progress Notes (Signed)
Oncology Nurse Navigator Documentation     08/12/2022    8:45 AM  Oncology Nurse Navigator Flowsheets  Navigator Follow Up Date: 09/11/2022  Navigator Follow Up Reason: Scan Review  Navigator Location [facility]  Navigator Encounter Type Treatment;Appt/Treatment Plan Review  Patient Visit Type MedOnc  Treatment Phase Active Tx  Barriers/Navigation Needs No Barriers At This Time  Interventions None Required  Acuity Level 1-No Barriers  Support Groups/Services Friends and Family  Time Spent with Patient 15

## 2022-08-19 NOTE — Telephone Encounter (Signed)
Oral Chemotherapy Pharmacist Encounter   Called [facility] to check on status of [place]. They confirmed that patient had enrolled in [place] per insurance requirements as of 08/18/22. Per representative Levada Dy, it will be 24-48 hours before they have finalized shipment to patient.   HbA1c orders have been entered - patient will need lab appointment scheduled to obtain baseline HbA1c prior to starting [place].   Oral oncology clinic will continue to follow.  Leron Croak, PharmD, BCPS, [facility] Hematology/Oncology Clinical Pharmacist [facility] and [place] [phone] 08/19/2022 10:24 AM

## 2022-08-20 ENCOUNTER — Other Ambulatory Visit: Payer: Self-pay | Admitting: Hematology & Oncology

## 2022-08-20 ENCOUNTER — Inpatient Hospital Stay: Payer: BC Managed Care – PPO

## 2022-08-20 DIAGNOSIS — C7951 Secondary malignant neoplasm of bone: Secondary | ICD-10-CM | POA: Diagnosis not present

## 2022-08-20 DIAGNOSIS — Z923 Personal history of irradiation: Secondary | ICD-10-CM | POA: Diagnosis not present

## 2022-08-20 DIAGNOSIS — C50919 Malignant neoplasm of unspecified site of unspecified female breast: Secondary | ICD-10-CM

## 2022-08-20 DIAGNOSIS — D649 Anemia, unspecified: Secondary | ICD-10-CM | POA: Diagnosis not present

## 2022-08-20 DIAGNOSIS — Z170 Estrogen receptor positive status [ER+]: Secondary | ICD-10-CM | POA: Diagnosis not present

## 2022-08-20 DIAGNOSIS — Z5111 Encounter for antineoplastic chemotherapy: Secondary | ICD-10-CM | POA: Diagnosis not present

## 2022-08-20 LAB — HEMOGLOBIN A1C
Hgb A1c MFr Bld: 5.1 % (ref 4.8–5.6)
Mean Plasma Glucose: 99.67 mg/dL

## 2022-08-20 NOTE — Telephone Encounter (Signed)
Oral Chemotherapy Pharmacist Encounter  Called and spoke with patient regarding Piqray. She states it is scheduled to be delivered to her home from [place] on Tuesday, 08/25/22. Patient requests a call back between 10AM and 1PM to discuss the medication in further detail.   Leron Croak, PharmD, BCPS, [facility] Hematology/Oncology Clinical Pharmacist [facility] and [place] [phone] 08/20/2022 12:21 PM

## 2022-08-21 MED ORDER — FENTANYL 50 MCG/HR TD PT72: 1.0000 | MEDICATED_PATCH | TRANSDERMAL | 0 refills | Status: DC

## 2022-08-27 NOTE — Telephone Encounter (Signed)
Oral Chemotherapy Pharmacist Encounter   3rd attempt made to reach patient to provide update and offer for initial counseling on oral medication: Piqray (alpelisib).   Will close encounter at this time. Per [place] was to be delivered to patient's home on 08/25/22.  Leron Croak, PharmD, BCPS, BCOP Hematology/Oncology Clinical Pharmacist [facility] and [place] [phone] 08/27/2022 2:18 PM

## 2022-08-30 NOTE — Progress Notes (Signed)
Radiation Oncology         [phone] ________________________________  Name: Annette James MRN: [id number]  Date: 08/31/2022  DOB: 03/20/1961  Follow-Up Visit Note  CC: [name], MD  Volanda Napoleon, MD  No diagnosis found.  Diagnosis:   The encounter diagnosis was Breast cancer metastasized to bone, unspecified laterality ([place]).   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  The patient tolerated radiation therapy relatively well other than some intermittent nausea and fatigue. She denied any other side effects.             Since completing radiation, the patient followed up with Dr. Marin Olp on 07/15/22. During which time, labs reviewed showed that her blood count was bit lower. She was otherwise noted to be tolerating ibrance and faslodex well. For her anemia, Dr. Marin Olp advised her to take iron supplements.   Although she tolerated ibrance well, Dr. Marin Olp switched her over to [facility] earlier this month given concerns of ongoing/persistent anemia. She was also arranged for a restaging PET scan for next month to assess treatment response.    ***  Allergies:  has No Known Allergies.  Meds: Current Outpatient Medications  Medication Sig Dispense Refill   alpelisib 300 mg daily dose, 2 X 150  mg oral tablets, (PIQRAY, 300 MG DAILY DOSE,) tablet Take 2 tablets (300 mg total) by mouth daily. Take with food daily. 56 tablet 0   ascorbic acid (VITAMIN C) 500 MG tablet Take 500 mg by mouth daily.     cyclobenzaprine (FLEXERIL) 5 MG tablet TAKE 1 TABLET BY MOUTH THREE TIMES A DAY AS NEEDED FOR MUSCLE SPASMS 30 tablet 2   fentaNYL (DURAGESIC) 50 MCG/HR Place 1 patch onto the skin every other day. 15 patch 0   Iron-FA-B Cmp-C-Biot-Probiotic (FUSION PLUS) CAPS Take 1 capsule by mouth daily after breakfast. (Patient not taking: Reported on 08/12/2022) 30 capsule 4   Multiple Vitamins-Minerals (ONE-A-DAY WOMENS 50 PLUS) TABS Take 1 tablet by mouth daily. (Patient not taking: Reported on 08/12/2022)     ondansetron (ZOFRAN) 8 MG tablet Take 1 tablet (8 mg total) by mouth every 8 (eight) hours as needed for nausea or vomiting. (Patient not taking: Reported on 08/12/2022) 90 tablet 1   oxyCODONE (OXY IR/ROXICODONE) 5 MG immediate release tablet Take 1-2 tablets (5-10 mg total) by mouth every 4 (four) hours as needed. 60 tablet 0   prochlorperazine (COMPAZINE) 10 MG tablet Take 1 tablet (10 mg total) by mouth every 6 (six) hours as needed for nausea or vomiting. (Patient not taking: Reported on 08/12/2022) 30 tablet 0   No current facility-administered medications for this encounter.    Physical Findings: The patient is in no acute distress. Patient is alert and oriented.  vitals were not taken for this visit. Marland Kitchen  No significant changes. Lungs are clear to auscultation bilaterally. Heart has regular rate and rhythm. No palpable cervical, supraclavicular, or axillary adenopathy. Abdomen soft, non-tender, normal bowel sounds.   Lab Findings: Lab Results  Component Value Date   WBC 1.4 (L) 08/12/2022   HGB 8.4 (L) 08/12/2022   HCT 25.4 (L) 08/12/2022   MCV 100.8 (H) 08/12/2022   PLT 111 (L) 08/12/2022    Radiographic Findings: No results found.  Impression: The encounter diagnosis was Breast  cancer metastasized to bone, unspecified laterality ([place]).   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  ***  Plan:  ***   *** minutes of total time was spent for this patient encounter, including preparation, face-to-face counseling with the patient and coordination of care, physical exam, and documentation of the encounter. ____________________________________  Blair Promise, PhD, MD  This document serves as a record of services personally performed by Gery Pray, MD. It was created on his behalf by Roney Mans, a trained medical scribe. The creation of this record is based on the scribe's personal observations and the provider's statements to them. This document has been checked and approved by the attending provider.

## 2022-08-30 NOTE — Progress Notes (Incomplete)
  Radiation Oncology         [phone] ________________________________  Patient Name: Annette James MRN: [id number] DOB: 07/07/1961 Referring Physician: Burney Gauze (Profile Not Attached) Date of Service: 07/14/2022  [facility] [place]                                                        End Of Treatment Note  Diagnoses: C79.51-Secondary malignant neoplasm of bone  Cancer Staging: The encounter diagnosis was Breast cancer metastasized to bone, unspecified laterality ([place]).   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 She denied any other side effects.   Plan: The patient will follow-up with radiation oncology in one month .  ________________________________________________ -----------------------------------  Blair Promise, PhD, MD  This document serves as a record of services personally performed by Gery Pray, MD. It was created on his behalf by Roney Mans, a trained medical scribe. The creation of this record is based on the scribe's personal observations and the provider's statements to them. This document has been checked and approved by the attending provider.

## 2022-08-31 ENCOUNTER — Ambulatory Visit
Admission: RE | Admit: 2022-08-31 | Discharge: 2022-08-31 | Disposition: A | Discharge status: Home / Self Care | Payer: BC Managed Care – PPO | Source: Ambulatory Visit | Attending: Radiation Oncology | Admitting: Radiation Oncology

## 2022-08-31 ENCOUNTER — Encounter: Payer: Self-pay | Admitting: Radiation Oncology

## 2022-08-31 DIAGNOSIS — Z923 Personal history of irradiation: Secondary | ICD-10-CM | POA: Diagnosis not present

## 2022-08-31 DIAGNOSIS — Z79899 Other long term (current) drug therapy: Secondary | ICD-10-CM | POA: Diagnosis not present

## 2022-08-31 DIAGNOSIS — Z170 Estrogen receptor positive status [ER+]: Secondary | ICD-10-CM | POA: Insufficient documentation

## 2022-08-31 DIAGNOSIS — C50411 Malignant neoplasm of upper-outer quadrant of right female breast: Secondary | ICD-10-CM | POA: Diagnosis not present

## 2022-08-31 DIAGNOSIS — C50919 Malignant neoplasm of unspecified site of unspecified female breast: Secondary | ICD-10-CM

## 2022-08-31 DIAGNOSIS — C7951 Secondary malignant neoplasm of bone: Secondary | ICD-10-CM | POA: Diagnosis not present

## 2022-08-31 NOTE — Progress Notes (Signed)
Rina Adney is here today for follow up post radiation to the T-spine.   They completed their radiation on: 07/14/20   Does the patient complain of any of the following:  Pain:No Fatigue: Yes  Diarrhea/Constipation: No Nausea/Vomiting: Nausea, patient states she started taking Piqray last week, which could be causing nausea. Nausea relieved with Zofran and compazine.  Urinary Issues (dysuria/incomplete emptying/ incontinence/ increased frequency/urgency): No Post radiation skin changes: No    Additional comments if applicable:    BP (!) 349/61   Pulse (!) 108   Temp 97.7 F (36.5 C)   Resp 17   Wt 166 lb 6.4 oz (75.5 kg)   SpO2 99%   BMI 25.30 kg/m

## 2022-09-04 ENCOUNTER — Other Ambulatory Visit: Payer: Self-pay | Admitting: Hematology & Oncology

## 2022-09-04 DIAGNOSIS — C50919 Malignant neoplasm of unspecified site of unspecified female breast: Secondary | ICD-10-CM

## 2022-09-07 ENCOUNTER — Other Ambulatory Visit: Payer: Self-pay | Admitting: Hematology & Oncology

## 2022-09-07 ENCOUNTER — Encounter: Payer: Self-pay | Admitting: *Deleted

## 2022-09-07 DIAGNOSIS — C50919 Malignant neoplasm of unspecified site of unspecified female breast: Secondary | ICD-10-CM

## 2022-09-07 DIAGNOSIS — C7951 Secondary malignant neoplasm of bone: Secondary | ICD-10-CM

## 2022-09-07 MED ORDER — OXYCODONE HCL 5 MG PO TABS: 5.0000 mg | ORAL_TABLET | ORAL | 0 refills | Status: DC | PRN

## 2022-09-11 ENCOUNTER — Ambulatory Visit (HOSPITAL_COMMUNITY)
Admission: RE | Admit: 2022-09-11 | Discharge: 2022-09-11 | Disposition: A | Discharge status: Home / Self Care | Payer: BC Managed Care – PPO | Source: Ambulatory Visit | Attending: Hematology & Oncology | Admitting: Hematology & Oncology

## 2022-09-11 DIAGNOSIS — C50919 Malignant neoplasm of unspecified site of unspecified female breast: Secondary | ICD-10-CM | POA: Insufficient documentation

## 2022-09-11 DIAGNOSIS — C7951 Secondary malignant neoplasm of bone: Secondary | ICD-10-CM | POA: Insufficient documentation

## 2022-09-11 MED ORDER — FLUOROESTRADIOL F 18 4-100 MCI/ML IV SOLN
6.0000 | Freq: Once | INTRAVENOUS | Status: AC
  Administered 2022-09-11: 6 via INTRAVENOUS

## 2022-09-14 ENCOUNTER — Other Ambulatory Visit (HOSPITAL_BASED_OUTPATIENT_CLINIC_OR_DEPARTMENT_OTHER): Payer: Self-pay

## 2022-09-14 ENCOUNTER — Encounter: Payer: Self-pay | Admitting: Hematology & Oncology

## 2022-09-14 ENCOUNTER — Inpatient Hospital Stay: Payer: BC Managed Care – PPO

## 2022-09-14 ENCOUNTER — Inpatient Hospital Stay: Payer: BC Managed Care – PPO | Attending: Hematology & Oncology

## 2022-09-14 ENCOUNTER — Other Ambulatory Visit: Payer: Self-pay

## 2022-09-14 ENCOUNTER — Encounter: Payer: Self-pay | Admitting: *Deleted

## 2022-09-14 ENCOUNTER — Inpatient Hospital Stay (HOSPITAL_BASED_OUTPATIENT_CLINIC_OR_DEPARTMENT_OTHER): Payer: BC Managed Care – PPO | Admitting: Hematology & Oncology

## 2022-09-14 VITALS — BP 156/69 | HR 99 | Temp 98.5°F | Resp 17 | Ht 68.0 in | Wt 168.0 lb

## 2022-09-14 DIAGNOSIS — Z170 Estrogen receptor positive status [ER+]: Secondary | ICD-10-CM | POA: Diagnosis not present

## 2022-09-14 DIAGNOSIS — D649 Anemia, unspecified: Secondary | ICD-10-CM | POA: Insufficient documentation

## 2022-09-14 DIAGNOSIS — R978 Other abnormal tumor markers: Secondary | ICD-10-CM | POA: Insufficient documentation

## 2022-09-14 DIAGNOSIS — Z79899 Other long term (current) drug therapy: Secondary | ICD-10-CM | POA: Insufficient documentation

## 2022-09-14 DIAGNOSIS — Z5111 Encounter for antineoplastic chemotherapy: Secondary | ICD-10-CM | POA: Diagnosis not present

## 2022-09-14 DIAGNOSIS — C7951 Secondary malignant neoplasm of bone: Secondary | ICD-10-CM | POA: Diagnosis not present

## 2022-09-14 DIAGNOSIS — D500 Iron deficiency anemia secondary to blood loss (chronic): Secondary | ICD-10-CM | POA: Diagnosis not present

## 2022-09-14 DIAGNOSIS — Z923 Personal history of irradiation: Secondary | ICD-10-CM | POA: Diagnosis not present

## 2022-09-14 DIAGNOSIS — C50919 Malignant neoplasm of unspecified site of unspecified female breast: Secondary | ICD-10-CM | POA: Insufficient documentation

## 2022-09-14 LAB — CMP (CANCER CENTER ONLY)
ALT: 30 U/L (ref 0–44)
AST: 30 U/L (ref 15–41)
Albumin: 4.1 g/dL (ref 3.5–5.0)
Alkaline Phosphatase: 199 U/L — ABNORMAL HIGH (ref 38–126)
Anion gap: 9 (ref 5–15)
BUN: 11 mg/dL (ref 6–20)
CO2: 29 mmol/L (ref 22–32)
Calcium: 9.1 mg/dL (ref 8.9–10.3)
Chloride: 102 mmol/L (ref 98–111)
Creatinine: 0.76 mg/dL (ref 0.44–1.00)
GFR, Estimated: >60 mL/min (ref >60)
Glucose, Bld: 113 mg/dL — ABNORMAL HIGH (ref 70–99)
Potassium: 3.3 mmol/L — ABNORMAL LOW (ref 3.5–5.1)
Sodium: 140 mmol/L (ref 135–145)
Total Bilirubin: 0.6 mg/dL (ref 0.3–1.2)
Total Protein: 6.7 g/dL (ref 6.5–8.1)

## 2022-09-14 LAB — CBC WITH DIFFERENTIAL (CANCER CENTER ONLY)
Abs Immature Granulocytes: 0.72 10*3/uL — ABNORMAL HIGH (ref 0.00–0.07)
Basophils Absolute: 0.1 10*3/uL (ref 0.0–0.1)
Basophils Relative: 1 %
Eosinophils Absolute: 0.3 10*3/uL (ref 0.0–0.5)
Eosinophils Relative: 4 %
HCT: 34.1 % — ABNORMAL LOW (ref 36.0–46.0)
Hemoglobin: 10.9 g/dL — ABNORMAL LOW (ref 12.0–15.0)
Immature Granulocytes: 9 %
Lymphocytes Relative: 11 %
Lymphs Abs: 0.9 10*3/uL (ref 0.7–4.0)
MCH: 30.8 pg (ref 26.0–34.0)
MCHC: 32 g/dL (ref 30.0–36.0)
MCV: 96.3 fL (ref 80.0–100.0)
Monocytes Absolute: 0.6 10*3/uL (ref 0.1–1.0)
Monocytes Relative: 7 %
Neutro Abs: 5.6 10*3/uL (ref 1.7–7.7)
Neutrophils Relative %: 68 %
Platelet Count: 94 10*3/uL — ABNORMAL LOW (ref 150–400)
RBC: 3.54 MIL/uL — ABNORMAL LOW (ref 3.87–5.11)
RDW: 17.7 % — ABNORMAL HIGH (ref 11.5–15.5)
WBC Count: 8.3 10*3/uL (ref 4.0–10.5)
nRBC: 7 % — ABNORMAL HIGH (ref 0.0–0.2)

## 2022-09-14 LAB — IRON AND IRON BINDING CAPACITY (CC-WL,HP ONLY)
Iron: 121 ug/dL (ref 28–170)
Saturation Ratios: 35 % — ABNORMAL HIGH (ref 10.4–31.8)
TIBC: 342 ug/dL (ref 250–450)
UIBC: 221 ug/dL (ref 148–442)

## 2022-09-14 LAB — RETICULOCYTES
Immature Retic Fract: 36.7 % — ABNORMAL HIGH (ref 2.3–15.9)
RBC.: 3.53 MIL/uL — ABNORMAL LOW (ref 3.87–5.11)
Retic Count, Absolute: 179.3 10*3/uL (ref 19.0–186.0)
Retic Ct Pct: 5.1 % — ABNORMAL HIGH (ref 0.4–3.1)

## 2022-09-14 LAB — SAVE SMEAR(SSMR), FOR PROVIDER SLIDE REVIEW

## 2022-09-14 LAB — FERRITIN: Ferritin: 676 ng/mL — ABNORMAL HIGH (ref 11–307)

## 2022-09-14 LAB — LACTATE DEHYDROGENASE: LDH: 380 U/L — ABNORMAL HIGH (ref 98–192)

## 2022-09-14 MED ORDER — FULVESTRANT 250 MG/5ML IM SOSY
500.0000 mg | PREFILLED_SYRINGE | Freq: Once | INTRAMUSCULAR | Status: AC
  Administered 2022-09-14: 500 mg via INTRAMUSCULAR
  Filled 2022-09-14: qty 10

## 2022-09-14 NOTE — Progress Notes (Signed)
Hematology and Oncology Follow Up Visit  Annette James [id number] 07/28/1961 61 y.o. 09/14/2022   Principle Diagnosis:  Metastatic breast cancer-bone only metastasis-ER positive/PR positive/HER2 LOW ///  PIK3CA (+)   Current Therapy:        Faslodex 500 mg IM monthly-start on 11/06/2020 Ibrance 125 MG PO Q DAY (21 days on/7 days off) - d/c on 08/12/2022 XRT to the RIGHT hip  --completed on 07/24/2021 Zometa 4 mg IV q. 3 months -- next dose on 11/2022 XRT to T12 -- completed on 07/14/2022 Piqray 300 mg po q day -- start on 08/24/2022    Interim History:  Annette James is here today for follow-up.  She is doing fairly well.  She now is on Piqray.  She has had no problems with the [place].  She says she had little bit of nausea when everything first started.  However, this seems to have improved.  She is not having diarrhea.  Her blood sugars actually are not that high.  Her last CA 27.29 was up to 146.  We did do a PET scan on her.  This was a special PET scan that that looks at estrogen uptake.  The PET scan really was not that helpful for [place].  There was activity that was noted in the left humerus, T4.  She does have a little bit discomfort up in the right shoulder area.  She is still working.  She is quite busy at work.  Thankfully, she does not have to travel for work.  She has had a good Thanksgiving.  She was with one of her daughters.  She has had no issues with bleeding.  She brought 3 stool cards and they are all negative.  She has been anemic.  Thankfully, her anemia is better right now.  This might be an indicator that there was cancer in the bone marrow that is being addressed by the [place].  Of note, her last erythropoietin level was 276.  We did check iron studies on her.  This was back in November.  Her ferritin was 816 with iron saturation of 36%.  Currently, I would have said that her performance status is ECOG 1.   Medications:  Allergies as of 09/14/2022    No Known Allergies      Medication List        Accurate as of September 14, 2022 11:00 AM. If you have any questions, ask your nurse or doctor.          ascorbic acid 500 MG tablet Commonly known as: VITAMIN C Take 500 mg by mouth daily.   cyclobenzaprine 5 MG tablet Commonly known as: FLEXERIL TAKE 1 TABLET BY MOUTH THREE TIMES A DAY AS NEEDED FOR MUSCLE SPASM   fentaNYL 50 MCG/HR Commonly known as: [place] 1 patch onto the skin every other day.   ondansetron 8 MG tablet Commonly known as: Zofran Take 1 tablet (8 mg total) by mouth every 8 (eight) hours as needed for nausea or vomiting.   One-A-Day Womens 50 Plus Tabs Take 1 tablet by mouth daily.   oxyCODONE 5 MG immediate release tablet Commonly known as: Oxy IR/ROXICODONE Take 1-2 tablets (5-10 mg total) by mouth every 4 (four) hours as needed.   Piqray (300 MG Daily Dose) tablet Generic drug: alpelisib 300 mg daily dose (2 X 150 mg oral tablets) TAKE 2 TABLETS (300 MG) DAILY WITH FOOD (SWITCHING FROM IBRANCE TO PIQRAYN)   prochlorperazine 10 MG tablet Commonly known as:  COMPAZINE Take 1 tablet (10 mg total) by mouth every 6 (six) hours as needed for nausea or vomiting.        Allergies: No Known Allergies  Past Medical History, Surgical history, Social history, and Family History were reviewed and updated.  Review of Systems: Review of Systems  Constitutional: Negative.   HENT: Negative.    Eyes: Negative.   Respiratory: Negative.    Cardiovascular: Negative.   Gastrointestinal: Negative.   Genitourinary: Negative.   Musculoskeletal:  Positive for joint pain and neck pain.  Skin: Negative.   Neurological: Negative.   Endo/Heme/Allergies: Negative.   Psychiatric/Behavioral: Negative.        Physical Exam:  height is _0  (1.727 m) and weight is 168 lb (76.2 kg). Her oral temperature is 98.5 F (36.9 C). Her blood pressure is 156/69 (abnormal) and her pulse is 99. Her respiration is  17 and oxygen saturation is 100%.   Wt Readings from Last 3 Encounters:  09/14/22 168 lb (76.2 kg)  08/31/22 166 lb 6.4 oz (75.5 kg)  08/12/22 171 lb 1.9 oz (77.6 kg)    Physical Exam Vitals reviewed.  HENT:     Head: Normocephalic and atraumatic.  Eyes:     Pupils: Pupils are equal, round, and reactive to light.  Cardiovascular:     Rate and Rhythm: Normal rate and regular rhythm.     Heart sounds: Normal heart sounds.  Pulmonary:     Effort: Pulmonary effort is normal.     Breath sounds: Normal breath sounds.  Abdominal:     General: Bowel sounds are normal.     Palpations: Abdomen is soft.  Musculoskeletal:        General: No tenderness or deformity. Normal range of motion.     Cervical back: Normal range of motion.  Lymphadenopathy:     Cervical: No cervical adenopathy.  Skin:    General: Skin is warm and dry.     Findings: No erythema or rash.  Neurological:     Mental Status: She is alert and oriented to person, place, and time.  Psychiatric:        Behavior: Behavior normal.        Thought Content: Thought content normal.        Judgment: Judgment normal.     Lab Results  Component Value Date   WBC 8.3 09/14/2022   HGB 10.9 (L) 09/14/2022   HCT 34.1 (L) 09/14/2022   MCV 96.3 09/14/2022   PLT 94 (L) 09/14/2022   Lab Results  Component Value Date   FERRITIN 816 (H) 08/12/2022   IRON 110 08/12/2022   TIBC 304 08/12/2022   UIBC 194 08/12/2022   IRONPCTSAT 36 (H) 08/12/2022   Lab Results  Component Value Date   RETICCTPCT 5.1 (H) 09/14/2022   RBC 3.53 (L) 09/14/2022   Lab Results  Component Value Date   KAPLAMBRATIO 4.99 10/20/2020   Lab Results  Component Value Date   IGGSERUM 714 10/19/2020   IGA 153 10/19/2020   IGMSERUM 76 10/19/2020   Lab Results  Component Value Date   TOTALPROTELP 6.1 10/19/2020     Chemistry      Component Value Date/Time   NA 140 09/14/2022 1001   K 3.3 (L) 09/14/2022 1001   CL 102 09/14/2022 1001   CO2 29  09/14/2022 1001   BUN 11 09/14/2022 1001   CREATININE 0.76 09/14/2022 1001      Component Value Date/Time   CALCIUM 9.1 09/14/2022 1001  ALKPHOS 199 (H) 09/14/2022 1001   AST 30 09/14/2022 1001   ALT 30 09/14/2022 1001   BILITOT 0.6 09/14/2022 1001       Impression and Plan: Ms. Wojtaszek is a very pleasant 61 yo caucasian female with metastatic breast cancer confined at this time to her bones.  She was diagnosed back in December 2021.  So far, she has done incredibly well on the Faslodex and Ibrance.  However, because the CA 27.29 was going up gradually, we felt that we needed to make a change.  Again she Piqray.  I think it might be limited to early for [place] to really know how well is going to work.  We will see what her CA 27.29 is.  Providers want her quality of life to be good.  Is now is been almost 2 years since she was diagnosed with the metastatic breast cancer.  She got some "mileage" out of the Faslodex/Ibrance.  I am glad that her anemia is better.  This will certainly help her quality of life.  I would like to see her back in another few weeks for follow-up.  I wanted to have a wonderful Christmas.  I know she will with her family.    Volanda Napoleon, MD 12/4/202311:00 AM

## 2022-09-14 NOTE — Progress Notes (Unsigned)
Reviewed PET scan results. Her treatment was recently changed to [facility] which she has good tolerance. Will continue current treatment with monthly tumor markers.   Oncology Nurse Navigator Documentation     09/14/2022   10:30 AM  Oncology Nurse Navigator Flowsheets  Navigator Follow Up Date: 10/14/2022  Navigator Follow Up Reason: Follow-up Appointment  Navigator Location [facility]  Navigator Encounter Type Follow-up Appt;Scan Review  Patient Visit Type MedOnc  Treatment Phase Active Tx  Barriers/Navigation Needs No Barriers At This Time  Interventions None Required  Acuity Level 1-No Barriers  Support Groups/Services Friends and Family  Time Spent with Patient 15

## 2022-09-14 NOTE — Patient Instructions (Signed)

## 2022-09-15 ENCOUNTER — Encounter: Payer: Self-pay | Admitting: Hematology & Oncology

## 2022-09-15 ENCOUNTER — Encounter: Payer: Self-pay | Admitting: *Deleted

## 2022-09-15 LAB — CANCER ANTIGEN 27.29: CA 27.29: 129.7 U/mL — ABNORMAL HIGH (ref 0.0–38.6)

## 2022-09-18 ENCOUNTER — Other Ambulatory Visit: Payer: Self-pay | Admitting: Hematology & Oncology

## 2022-09-18 DIAGNOSIS — C50919 Malignant neoplasm of unspecified site of unspecified female breast: Secondary | ICD-10-CM

## 2022-09-18 DIAGNOSIS — C7951 Secondary malignant neoplasm of bone: Secondary | ICD-10-CM

## 2022-09-21 MED ORDER — FENTANYL 50 MCG/HR TD PT72: 1.0000 | MEDICATED_PATCH | TRANSDERMAL | 0 refills | Status: DC

## 2022-09-21 NOTE — Telephone Encounter (Signed)
Last refilled 08/21/22 #15 with 0 refills

## 2022-09-25 ENCOUNTER — Other Ambulatory Visit: Payer: Self-pay | Admitting: Hematology & Oncology

## 2022-09-25 DIAGNOSIS — C50919 Malignant neoplasm of unspecified site of unspecified female breast: Secondary | ICD-10-CM

## 2022-09-28 ENCOUNTER — Other Ambulatory Visit: Payer: Self-pay | Admitting: Hematology & Oncology

## 2022-09-28 ENCOUNTER — Encounter: Payer: Self-pay | Admitting: Hematology & Oncology

## 2022-09-28 DIAGNOSIS — C50919 Malignant neoplasm of unspecified site of unspecified female breast: Secondary | ICD-10-CM

## 2022-09-28 NOTE — Telephone Encounter (Signed)
Too soon for refill. Last refilled 09/07/22 #60.

## 2022-10-07 ENCOUNTER — Other Ambulatory Visit: Payer: Self-pay | Admitting: *Deleted

## 2022-10-07 ENCOUNTER — Encounter: Payer: Self-pay | Admitting: *Deleted

## 2022-10-07 DIAGNOSIS — C50919 Malignant neoplasm of unspecified site of unspecified female breast: Secondary | ICD-10-CM

## 2022-10-07 MED ORDER — OXYCODONE HCL 5 MG PO TABS: 5.0000 mg | ORAL_TABLET | ORAL | 0 refills | Status: DC | PRN

## 2022-10-14 ENCOUNTER — Other Ambulatory Visit (HOSPITAL_BASED_OUTPATIENT_CLINIC_OR_DEPARTMENT_OTHER): Payer: Self-pay

## 2022-10-14 ENCOUNTER — Encounter: Payer: Self-pay | Admitting: Hematology & Oncology

## 2022-10-14 ENCOUNTER — Inpatient Hospital Stay: Payer: BC Managed Care – PPO | Attending: Hematology & Oncology

## 2022-10-14 ENCOUNTER — Encounter: Payer: Self-pay | Admitting: *Deleted

## 2022-10-14 ENCOUNTER — Inpatient Hospital Stay: Payer: BC Managed Care – PPO

## 2022-10-14 ENCOUNTER — Inpatient Hospital Stay: Payer: BC Managed Care – PPO | Admitting: Hematology & Oncology

## 2022-10-14 ENCOUNTER — Other Ambulatory Visit: Payer: Self-pay

## 2022-10-14 VITALS — BP 149/82 | HR 95 | Temp 97.8°F | Resp 18 | Ht 68.0 in | Wt 159.4 lb

## 2022-10-14 DIAGNOSIS — Z170 Estrogen receptor positive status [ER+]: Secondary | ICD-10-CM | POA: Insufficient documentation

## 2022-10-14 DIAGNOSIS — C7951 Secondary malignant neoplasm of bone: Secondary | ICD-10-CM

## 2022-10-14 DIAGNOSIS — Z5111 Encounter for antineoplastic chemotherapy: Secondary | ICD-10-CM | POA: Diagnosis not present

## 2022-10-14 DIAGNOSIS — D500 Iron deficiency anemia secondary to blood loss (chronic): Secondary | ICD-10-CM

## 2022-10-14 DIAGNOSIS — R978 Other abnormal tumor markers: Secondary | ICD-10-CM | POA: Insufficient documentation

## 2022-10-14 DIAGNOSIS — D539 Nutritional anemia, unspecified: Secondary | ICD-10-CM | POA: Diagnosis not present

## 2022-10-14 DIAGNOSIS — C50919 Malignant neoplasm of unspecified site of unspecified female breast: Secondary | ICD-10-CM

## 2022-10-14 DIAGNOSIS — E611 Iron deficiency: Secondary | ICD-10-CM | POA: Diagnosis not present

## 2022-10-14 LAB — CBC WITH DIFFERENTIAL (CANCER CENTER ONLY)
Abs Immature Granulocytes: 0.53 10*3/uL — ABNORMAL HIGH (ref 0.00–0.07)
Basophils Absolute: 0.1 10*3/uL (ref 0.0–0.1)
Basophils Relative: 1 %
Eosinophils Absolute: 0.1 10*3/uL (ref 0.0–0.5)
Eosinophils Relative: 1 %
HCT: 35.2 % — ABNORMAL LOW (ref 36.0–46.0)
Hemoglobin: 11.7 g/dL — ABNORMAL LOW (ref 12.0–15.0)
Immature Granulocytes: 5 %
Lymphocytes Relative: 5 %
Lymphs Abs: 0.5 10*3/uL — ABNORMAL LOW (ref 0.7–4.0)
MCH: 30.2 pg (ref 26.0–34.0)
MCHC: 33.2 g/dL (ref 30.0–36.0)
MCV: 91 fL (ref 80.0–100.0)
Monocytes Absolute: 0.5 10*3/uL (ref 0.1–1.0)
Monocytes Relative: 5 %
Neutro Abs: 8.3 10*3/uL — ABNORMAL HIGH (ref 1.7–7.7)
Neutrophils Relative %: 83 %
Platelet Count: 207 10*3/uL (ref 150–400)
RBC: 3.87 MIL/uL (ref 3.87–5.11)
RDW: 15.9 % — ABNORMAL HIGH (ref 11.5–15.5)
WBC Count: 10 10*3/uL (ref 4.0–10.5)
nRBC: 0.6 % — ABNORMAL HIGH (ref 0.0–0.2)

## 2022-10-14 LAB — IRON AND IRON BINDING CAPACITY (CC-WL,HP ONLY)
Iron: 56 ug/dL (ref 28–170)
Saturation Ratios: 22 % (ref 10.4–31.8)
TIBC: 258 ug/dL (ref 250–450)
UIBC: 202 ug/dL (ref 148–442)

## 2022-10-14 LAB — CMP (CANCER CENTER ONLY)
ALT: 11 U/L (ref 0–44)
AST: 18 U/L (ref 15–41)
Albumin: 4.2 g/dL (ref 3.5–5.0)
Alkaline Phosphatase: 155 U/L — ABNORMAL HIGH (ref 38–126)
Anion gap: 11 (ref 5–15)
BUN: 13 mg/dL (ref 6–20)
CO2: 29 mmol/L (ref 22–32)
Calcium: 9.3 mg/dL (ref 8.9–10.3)
Chloride: 96 mmol/L — ABNORMAL LOW (ref 98–111)
Creatinine: 0.67 mg/dL (ref 0.44–1.00)
GFR, Estimated: >60 mL/min (ref >60)
Glucose, Bld: 175 mg/dL — ABNORMAL HIGH (ref 70–99)
Potassium: 3 mmol/L — ABNORMAL LOW (ref 3.5–5.1)
Sodium: 136 mmol/L (ref 135–145)
Total Bilirubin: 1 mg/dL (ref 0.3–1.2)
Total Protein: 7.3 g/dL (ref 6.5–8.1)

## 2022-10-14 LAB — FERRITIN: Ferritin: 1446 ng/mL — ABNORMAL HIGH (ref 11–307)

## 2022-10-14 LAB — RETICULOCYTES
Immature Retic Fract: 28.9 % — ABNORMAL HIGH (ref 2.3–15.9)
RBC.: 3.93 MIL/uL (ref 3.87–5.11)
Retic Count, Absolute: 95.5 10*3/uL (ref 19.0–186.0)
Retic Ct Pct: 2.4 % (ref 0.4–3.1)

## 2022-10-14 LAB — LACTATE DEHYDROGENASE: LDH: 382 U/L — ABNORMAL HIGH (ref 98–192)

## 2022-10-14 MED ORDER — FULVESTRANT 250 MG/5ML IM SOSY
500.0000 mg | PREFILLED_SYRINGE | Freq: Once | INTRAMUSCULAR | Status: AC
  Administered 2022-10-14: 500 mg via INTRAMUSCULAR
  Filled 2022-10-14: qty 10

## 2022-10-14 MED ORDER — GLIMEPIRIDE 2 MG PO TABS
2.0000 mg | ORAL_TABLET | Freq: Every day | ORAL | 4 refills | Status: DC
  Filled 2022-10-14: qty 30, 30d supply, fill #0
  Filled 2022-11-10: qty 30, 30d supply, fill #1
  Filled 2022-12-08: qty 30, 30d supply, fill #2

## 2022-10-14 NOTE — Progress Notes (Signed)
Patient is doing well on her new treatment, however she will need to start an oral antidiabetic due to her [place].  Oncology Nurse Navigator Documentation     10/14/2022    3:00 PM  Oncology Nurse Navigator Flowsheets  Navigator Follow Up Date: 11/11/2022  Navigator Follow Up Reason: Follow-up Appointment  Navigator Location [facility]  Navigator Encounter Type Appt/Treatment Plan Review  Patient Visit Type MedOnc  Treatment Phase Active Tx  Barriers/Navigation Needs No Barriers At This Time  Interventions None Required  Acuity Level 1-No Barriers  Support Groups/Services Friends and Family  Time Spent with Patient 15

## 2022-10-14 NOTE — Patient Instructions (Signed)

## 2022-10-14 NOTE — Progress Notes (Signed)
Hematology and Oncology Follow Up Visit  Annette James [id number] 07/22/1961 62 y.o. 10/14/2022   Principle Diagnosis:  Metastatic breast cancer-bone only metastasis-ER positive/PR positive/HER2 LOW ///  PIK3CA (+)   Current Therapy:        Faslodex 500 mg IM monthly-start on 11/06/2020 Ibrance 125 MG PO Q DAY (21 days on/7 days off) - d/c on 08/12/2022 XRT to the RIGHT hip  --completed on 07/24/2021 Zometa 4 mg IV q. 3 months -- next dose on 11/2022 XRT to T12 -- completed on 07/14/2022 Piqray 300 mg po q day -- start on 08/24/2022    Interim History:  Annette James is here today for follow-up.  Annette James is doing fairly well.  Annette James is on Piqray.  Annette James blood sugars are on the high side.  I probably will have Annette James start taking Amaryl (2 mg p.o. daily) to see if this may help get Annette James blood sugars down a little bit.  Annette James last CA 27.29 was improving at 130.  Annette James did have a very nice holiday season.  Annette James was with Annette James family.  Annette James is quite busy at work.  Annette James is having some achiness in Annette James joints.  I told Annette James to try some over-the-counter nonsteroidal.  I think Motrin or Aleve would be reasonable.  I told Annette James to make sure Annette James takes it with food.  Annette James has had no change in bowel or bladder habits.  Annette James has had no nausea or vomiting.  There has been no problems with cough or shortness of breath.  Is now been over 2 years that we saw Annette James in treated Annette James.  Annette James really has done nicely.  Thankfully, Annette James anemia is improving.  I am not sure as to why this is improving.  Annette James does not have any iron deficiency.  When we last checked Annette James iron studies, Annette James ferritin was 676 with an iron saturation of 35%.  Currently, I would say that Annette James performance status is probably ECOG 1.      Medications:  Allergies as of 10/14/2022   No Known Allergies      Medication List        Accurate as of October 14, 2022 10:25 AM. If you have any questions, ask your nurse or doctor.          ascorbic acid 500 MG  tablet Commonly known as: VITAMIN C Take 500 mg by mouth daily.   cyclobenzaprine 5 MG tablet Commonly known as: FLEXERIL TAKE 1 TABLET BY MOUTH THREE TIMES A DAY AS NEEDED FOR MUSCLE SPASM   fentaNYL 50 MCG/HR Commonly known as: [place] 1 patch onto the skin every other day.   ondansetron 8 MG tablet Commonly known as: Zofran Take 1 tablet (8 mg total) by mouth every 8 (eight) hours as needed for nausea or vomiting.   One-A-Day Womens 50 Plus Tabs Take 1 tablet by mouth daily.   oxyCODONE 5 MG immediate release tablet Commonly known as: Oxy IR/ROXICODONE Take 1-2 tablets (5-10 mg total) by mouth every 4 (four) hours as needed.   Piqray (300 MG Daily Dose) tablet Generic drug: alpelisib 300 mg daily dose (2 X 150 mg oral tablets) TAKE 2 TABLETS (300 MG) DAILY WITH FOOD   prochlorperazine 10 MG tablet Commonly known as: COMPAZINE Take 1 tablet (10 mg total) by mouth every 6 (six) hours as needed for nausea or vomiting.        Allergies: No Known Allergies  Past Medical History, Surgical history, Social history,  and Family History were reviewed and updated.  Review of Systems: Review of Systems  Constitutional: Negative.   HENT: Negative.    Eyes: Negative.   Respiratory: Negative.    Cardiovascular: Negative.   Gastrointestinal: Negative.   Genitourinary: Negative.   Musculoskeletal:  Positive for joint pain and neck pain.  Skin: Negative.   Neurological: Negative.   Endo/Heme/Allergies: Negative.   Psychiatric/Behavioral: Negative.        Physical Exam:  height is _0  (1.727 m) and weight is 159 lb 6.4 oz (72.3 kg). Annette James oral temperature is 97.8 F (36.6 C). Annette James blood pressure is 149/82 (abnormal) and Annette James pulse is 95. Annette James respiration is 18 and oxygen saturation is 100%.   Wt Readings from Last 3 Encounters:  10/14/22 159 lb 6.4 oz (72.3 kg)  09/14/22 168 lb (76.2 kg)  08/31/22 166 lb 6.4 oz (75.5 kg)    Physical Exam Vitals reviewed.  HENT:      Head: Normocephalic and atraumatic.  Eyes:     Pupils: Pupils are equal, round, and reactive to light.  Cardiovascular:     Rate and Rhythm: Normal rate and regular rhythm.     Heart sounds: Normal heart sounds.  Pulmonary:     Effort: Pulmonary effort is normal.     Breath sounds: Normal breath sounds.  Abdominal:     General: Bowel sounds are normal.     Palpations: Abdomen is soft.  Musculoskeletal:        General: No tenderness or deformity. Normal range of motion.     Cervical back: Normal range of motion.  Lymphadenopathy:     Cervical: No cervical adenopathy.  Skin:    General: Skin is warm and dry.     Findings: No erythema or rash.  Neurological:     Mental Status: Annette James is alert and oriented to person, place, and time.  Psychiatric:        Behavior: Behavior normal.        Thought Content: Thought content normal.        Judgment: Judgment normal.     Lab Results  Component Value Date   WBC 10.0 10/14/2022   HGB 11.7 (L) 10/14/2022   HCT 35.2 (L) 10/14/2022   MCV 91.0 10/14/2022   PLT 207 10/14/2022   Lab Results  Component Value Date   FERRITIN 676 (H) 09/14/2022   IRON 121 09/14/2022   TIBC 342 09/14/2022   UIBC 221 09/14/2022   IRONPCTSAT 35 (H) 09/14/2022   Lab Results  Component Value Date   RETICCTPCT 2.4 10/14/2022   RBC 3.93 10/14/2022   Lab Results  Component Value Date   KAPLAMBRATIO 4.99 10/20/2020   Lab Results  Component Value Date   IGGSERUM 714 10/19/2020   IGA 153 10/19/2020   IGMSERUM 76 10/19/2020   Lab Results  Component Value Date   TOTALPROTELP 6.1 10/19/2020     Chemistry      Component Value Date/Time   NA 136 10/14/2022 0852   K 3.0 (L) 10/14/2022 0852   CL 96 (L) 10/14/2022 0852   CO2 29 10/14/2022 0852   BUN 13 10/14/2022 0852   CREATININE 0.67 10/14/2022 0852      Component Value Date/Time   CALCIUM 9.3 10/14/2022 0852   ALKPHOS 155 (H) 10/14/2022 0852   AST 18 10/14/2022 0852   ALT 11 10/14/2022 0852    BILITOT 1.0 10/14/2022 0852       Impression and Plan: Annette James is a  very pleasant 62 yo caucasian female with metastatic breast cancer confined at this time to Annette James bones.  Annette James was diagnosed back in December 2021.   Hopefully, Annette James will continue to respond to the [facility].  I would like to try to keep Annette James on antiestrogen therapy as long as possible.  We will go ahead with Annette James Faslodex today.  I will plan to get Annette James back to see me in another month.   Volanda Napoleon, MD 1/3/202410:25 AM

## 2022-10-15 ENCOUNTER — Telehealth: Payer: Self-pay

## 2022-10-15 LAB — CANCER ANTIGEN 27.29: CA 27.29: 112.2 U/mL — ABNORMAL HIGH (ref 0.0–38.6)

## 2022-10-15 NOTE — Telephone Encounter (Signed)
-----   Message from Volanda Napoleon, MD sent at 10/15/2022 12:35 PM EST ----- Call and let her know that the tumor marker keeps coming down.  It is now down to 112.  Excellent work.  Thanks.  Laurey Arrow

## 2022-10-26 ENCOUNTER — Other Ambulatory Visit: Payer: Self-pay | Admitting: Hematology & Oncology

## 2022-10-26 DIAGNOSIS — C50919 Malignant neoplasm of unspecified site of unspecified female breast: Secondary | ICD-10-CM

## 2022-10-30 ENCOUNTER — Other Ambulatory Visit: Payer: Self-pay | Admitting: Hematology & Oncology

## 2022-10-30 DIAGNOSIS — C7951 Secondary malignant neoplasm of bone: Secondary | ICD-10-CM

## 2022-10-30 DIAGNOSIS — C50919 Malignant neoplasm of unspecified site of unspecified female breast: Secondary | ICD-10-CM

## 2022-11-02 ENCOUNTER — Other Ambulatory Visit: Payer: Self-pay

## 2022-11-02 DIAGNOSIS — C7951 Secondary malignant neoplasm of bone: Secondary | ICD-10-CM

## 2022-11-02 DIAGNOSIS — C50919 Malignant neoplasm of unspecified site of unspecified female breast: Secondary | ICD-10-CM

## 2022-11-02 MED ORDER — FENTANYL 50 MCG/HR TD PT72: 1.0000 | MEDICATED_PATCH | TRANSDERMAL | 0 refills | Status: DC

## 2022-11-02 MED ORDER — OXYCODONE HCL 5 MG PO TABS: 5.0000 mg | ORAL_TABLET | ORAL | 0 refills | Status: DC | PRN

## 2022-11-06 ENCOUNTER — Other Ambulatory Visit: Payer: Self-pay | Admitting: Hematology & Oncology

## 2022-11-11 ENCOUNTER — Encounter: Payer: Self-pay | Admitting: *Deleted

## 2022-11-11 ENCOUNTER — Inpatient Hospital Stay: Payer: BC Managed Care – PPO

## 2022-11-11 ENCOUNTER — Encounter: Payer: Self-pay | Admitting: Hematology & Oncology

## 2022-11-11 ENCOUNTER — Inpatient Hospital Stay (HOSPITAL_BASED_OUTPATIENT_CLINIC_OR_DEPARTMENT_OTHER): Payer: BC Managed Care – PPO | Admitting: Hematology & Oncology

## 2022-11-11 VITALS — BP 142/83 | HR 90 | Resp 17

## 2022-11-11 VITALS — BP 161/74 | HR 88 | Temp 97.8°F | Resp 20 | Ht 68.0 in | Wt 162.1 lb

## 2022-11-11 DIAGNOSIS — C50919 Malignant neoplasm of unspecified site of unspecified female breast: Secondary | ICD-10-CM

## 2022-11-11 DIAGNOSIS — C7951 Secondary malignant neoplasm of bone: Secondary | ICD-10-CM | POA: Diagnosis not present

## 2022-11-11 DIAGNOSIS — Z5111 Encounter for antineoplastic chemotherapy: Secondary | ICD-10-CM | POA: Diagnosis not present

## 2022-11-11 DIAGNOSIS — E611 Iron deficiency: Secondary | ICD-10-CM | POA: Diagnosis not present

## 2022-11-11 DIAGNOSIS — Z170 Estrogen receptor positive status [ER+]: Secondary | ICD-10-CM | POA: Diagnosis not present

## 2022-11-11 DIAGNOSIS — D539 Nutritional anemia, unspecified: Secondary | ICD-10-CM

## 2022-11-11 DIAGNOSIS — R978 Other abnormal tumor markers: Secondary | ICD-10-CM | POA: Diagnosis not present

## 2022-11-11 LAB — RETICULOCYTES
Immature Retic Fract: 30 % — ABNORMAL HIGH (ref 2.3–15.9)
RBC.: 3.85 MIL/uL — ABNORMAL LOW (ref 3.87–5.11)
Retic Count, Absolute: 92.8 10*3/uL (ref 19.0–186.0)
Retic Ct Pct: 2.4 % (ref 0.4–3.1)

## 2022-11-11 LAB — CMP (CANCER CENTER ONLY)
ALT: 10 U/L (ref 0–44)
AST: 19 U/L (ref 15–41)
Albumin: 4.4 g/dL (ref 3.5–5.0)
Alkaline Phosphatase: 113 U/L (ref 38–126)
Anion gap: 12 (ref 5–15)
BUN: 14 mg/dL (ref 6–20)
CO2: 26 mmol/L (ref 22–32)
Calcium: 9.9 mg/dL (ref 8.9–10.3)
Chloride: 101 mmol/L (ref 98–111)
Creatinine: 0.75 mg/dL (ref 0.44–1.00)
GFR, Estimated: >60 mL/min (ref >60)
Glucose, Bld: 167 mg/dL — ABNORMAL HIGH (ref 70–99)
Potassium: 3.7 mmol/L (ref 3.5–5.1)
Sodium: 139 mmol/L (ref 135–145)
Total Bilirubin: 0.4 mg/dL (ref 0.3–1.2)
Total Protein: 7 g/dL (ref 6.5–8.1)

## 2022-11-11 LAB — CBC WITH DIFFERENTIAL (CANCER CENTER ONLY)
Abs Immature Granulocytes: 0.58 10*3/uL — ABNORMAL HIGH (ref 0.00–0.07)
Basophils Absolute: 0.1 10*3/uL (ref 0.0–0.1)
Basophils Relative: 1 %
Eosinophils Absolute: 1.3 10*3/uL — ABNORMAL HIGH (ref 0.0–0.5)
Eosinophils Relative: 14 %
HCT: 36.7 % (ref 36.0–46.0)
Hemoglobin: 11.6 g/dL — ABNORMAL LOW (ref 12.0–15.0)
Immature Granulocytes: 7 %
Lymphocytes Relative: 8 %
Lymphs Abs: 0.7 10*3/uL (ref 0.7–4.0)
MCH: 29.6 pg (ref 26.0–34.0)
MCHC: 31.6 g/dL (ref 30.0–36.0)
MCV: 93.6 fL (ref 80.0–100.0)
Monocytes Absolute: 0.4 10*3/uL (ref 0.1–1.0)
Monocytes Relative: 5 %
Neutro Abs: 5.8 10*3/uL (ref 1.7–7.7)
Neutrophils Relative %: 65 %
Platelet Count: 170 10*3/uL (ref 150–400)
RBC: 3.92 MIL/uL (ref 3.87–5.11)
RDW: 16 % — ABNORMAL HIGH (ref 11.5–15.5)
Smear Review: NORMAL
WBC Count: 8.8 10*3/uL (ref 4.0–10.5)
nRBC: 0.6 % — ABNORMAL HIGH (ref 0.0–0.2)

## 2022-11-11 LAB — IRON AND IRON BINDING CAPACITY (CC-WL,HP ONLY)
Iron: 56 ug/dL (ref 28–170)
Saturation Ratios: 17 % (ref 10.4–31.8)
TIBC: 330 ug/dL (ref 250–450)
UIBC: 274 ug/dL (ref 148–442)

## 2022-11-11 LAB — FERRITIN: Ferritin: 425 ng/mL — ABNORMAL HIGH (ref 11–307)

## 2022-11-11 MED ORDER — FULVESTRANT 250 MG/5ML IM SOSY
500.0000 mg | PREFILLED_SYRINGE | Freq: Once | INTRAMUSCULAR | Status: AC
  Administered 2022-11-11: 500 mg via INTRAMUSCULAR
  Filled 2022-11-11: qty 10

## 2022-11-11 MED ORDER — SODIUM CHLORIDE 0.9 % IV SOLN: Freq: Once | INTRAVENOUS | Status: AC

## 2022-11-11 MED ORDER — ZOLEDRONIC ACID 4 MG/100ML IV SOLN
4.0000 mg | Freq: Once | INTRAVENOUS | Status: AC
  Administered 2022-11-11: 4 mg via INTRAVENOUS
  Filled 2022-11-11: qty 100

## 2022-11-11 NOTE — Progress Notes (Signed)
Patient is on maintenance therapy and hasn't had navigational needs for some time. Will discontinue active navigation at this time. Will be available to the patient in the future as needed.   Oncology Nurse Navigator Documentation     11/11/2022    8:30 AM  Oncology Nurse Navigator Flowsheets  Navigation Complete Date: 11/11/2022  Post Navigation: Continue to Follow Patient? No  Reason Not Navigating Patient: Patient On Maintenance Chemotherapy  Navigator Location [facility]  Navigator Encounter Type Treatment;Appt/Treatment Plan Review  Patient Visit Type MedOnc  Treatment Phase Active Tx  Barriers/Navigation Needs No Barriers At This Time  Interventions None Required  Acuity Level 1-No Barriers  Support Groups/Services Friends and Family  Time Spent with Patient 15

## 2022-11-11 NOTE — Progress Notes (Signed)
Hematology and Oncology Follow Up Visit  Annette James [id number] [birth year] 62 y.o. 11/11/2022   Principle Diagnosis:  Metastatic breast cancer-bone only metastasis-ER positive/PR positive/HER2 LOW ///  PIK3CA (+)   Current Therapy:        Faslodex 500 mg IM monthly-start on 11/06/2020 Ibrance 125 MG PO Q DAY (21 days on/7 days off) - d/c on 08/12/2022 XRT to the RIGHT hip  --completed on 07/24/2021 Zometa 4 mg IV q. 3 months -- next dose on 02/2023 XRT to T12 -- completed on 07/14/2022 Piqray 300 mg po q day -- start on 08/24/2022    Interim History:  Annette James is here today for follow-up.  She looks quite good.  Very impressed with how well she is doing.  She really has had no problems with the [place].  Her blood sugars have been on the higher side but they seem to be under decent control.  Her last CA 27.29 was down to 112.  She has not complained of any pain.  She has had no problems with nausea or vomiting.  There is no cough or shortness of breath.  She is still working.  She has had no problems with fatigue or weakness.  She has had no rashes.  There is been no bleeding.  I am glad that her anemia is doing better.  I am still not sure as to what the cause of this was.  Overall, I would say that her performance status is ECOG 0.    Medications:  Allergies as of 11/11/2022   No Known Allergies      Medication List        Accurate as of November 11, 2022  8:23 AM. If you have any questions, ask your nurse or doctor.          ascorbic acid 500 MG tablet Commonly known as: VITAMIN C Take 500 mg by mouth daily.   cyclobenzaprine 5 MG tablet Commonly known as: FLEXERIL TAKE 1 TABLET BY MOUTH THREE TIMES A DAY AS NEEDED FOR MUSCLE SPASM   fentaNYL 50 MCG/HR Commonly known as: [place] 1 patch onto the skin every other day.   glimepiride 2 MG tablet Commonly known as: Amaryl Take 1 tablet (2 mg total) by mouth daily with breakfast.    ondansetron 8 MG tablet Commonly known as: Zofran Take 1 tablet (8 mg total) by mouth every 8 (eight) hours as needed for nausea or vomiting.   One-A-Day Womens 50 Plus Tabs Take 1 tablet by mouth daily.   oxyCODONE 5 MG immediate release tablet Commonly known as: Oxy IR/ROXICODONE Take 1-2 tablets (5-10 mg total) by mouth every 4 (four) hours as needed.   Piqray (300 MG Daily Dose) tablet Generic drug: alpelisib 300 mg daily dose (2 X 150 mg oral tablets) TAKE 2 TABLETS (300 MG) DAILY WITH FOOD   prochlorperazine 10 MG tablet Commonly known as: COMPAZINE Take 1 tablet (10 mg total) by mouth every 6 (six) hours as needed for nausea or vomiting.        Allergies: No Known Allergies  Past Medical History, Surgical history, Social history, and Family History were reviewed and updated.  Review of Systems: Review of Systems  Constitutional: Negative.   HENT: Negative.    Eyes: Negative.   Respiratory: Negative.    Cardiovascular: Negative.   Gastrointestinal: Negative.   Genitourinary: Negative.   Musculoskeletal:  Positive for joint pain and neck pain.  Skin: Negative.   Neurological: Negative.  Endo/Heme/Allergies: Negative.   Psychiatric/Behavioral: Negative.        Physical Exam:  height is '5\' 8"'$  (1.727 m) and weight is 162 lb 1.9 oz (73.5 kg). Her oral temperature is 97.8 F (36.6 C). Her blood pressure is 161/74 (abnormal) and her pulse is 88. Her respiration is 20 and oxygen saturation is 99%.   Wt Readings from Last 3 Encounters:  11/11/22 162 lb 1.9 oz (73.5 kg)  10/14/22 159 lb 6.4 oz (72.3 kg)  09/14/22 168 lb (76.2 kg)    Physical Exam Vitals reviewed.  HENT:     Head: Normocephalic and atraumatic.  Eyes:     Pupils: Pupils are equal, round, and reactive to light.  Cardiovascular:     Rate and Rhythm: Normal rate and regular rhythm.     Heart sounds: Normal heart sounds.  Pulmonary:     Effort: Pulmonary effort is normal.     Breath sounds:  Normal breath sounds.  Abdominal:     General: Bowel sounds are normal.     Palpations: Abdomen is soft.  Musculoskeletal:        General: No tenderness or deformity. Normal range of motion.     Cervical back: Normal range of motion.  Lymphadenopathy:     Cervical: No cervical adenopathy.  Skin:    General: Skin is warm and dry.     Findings: No erythema or rash.  Neurological:     Mental Status: She is alert and oriented to person, place, and time.  Psychiatric:        Behavior: Behavior normal.        Thought Content: Thought content normal.        Judgment: Judgment normal.      Lab Results  Component Value Date   WBC 8.8 11/11/2022   HGB 11.6 (L) 11/11/2022   HCT 36.7 11/11/2022   MCV 93.6 11/11/2022   PLT 170 11/11/2022   Lab Results  Component Value Date   FERRITIN 1,446 (H) 10/14/2022   IRON 56 10/14/2022   TIBC 258 10/14/2022   UIBC 202 10/14/2022   IRONPCTSAT 22 10/14/2022   Lab Results  Component Value Date   RETICCTPCT 2.4 11/11/2022   RBC 3.92 11/11/2022   RBC 3.85 (L) 11/11/2022   Lab Results  Component Value Date   KAPLAMBRATIO 4.99 10/20/2020   Lab Results  Component Value Date   IGGSERUM 714 10/19/2020   IGA 153 10/19/2020   IGMSERUM 76 10/19/2020   Lab Results  Component Value Date   TOTALPROTELP 6.1 10/19/2020     Chemistry      Component Value Date/Time   NA 136 10/14/2022 0852   K 3.0 (L) 10/14/2022 0852   CL 96 (L) 10/14/2022 0852   CO2 29 10/14/2022 0852   BUN 13 10/14/2022 0852   CREATININE 0.67 10/14/2022 0852      Component Value Date/Time   CALCIUM 9.3 10/14/2022 0852   ALKPHOS 155 (H) 10/14/2022 0852   AST 18 10/14/2022 0852   ALT 11 10/14/2022 0852   BILITOT 1.0 10/14/2022 0852       Impression and Plan: Annette James is a very pleasant 62 yo caucasian female with metastatic breast cancer confined at this time to her bones.  She was diagnosed back in December 2021.   Hopefully, she will continue to respond to  the [facility].  We will see what her CA 27.29 is.  I am just happy that her quality of life is doing so  well.  We will go ahead with her Zometa today.  I will plan to get her back in another month.   Volanda Napoleon, MD 1/31/20248:23 AM

## 2022-11-11 NOTE — Patient Instructions (Signed)
Zoledronic Acid Injection (Cancer) What is this medication? ZOLEDRONIC ACID (ZOE le dron ik AS id) treats high calcium levels in the blood caused by cancer. It may also be used with chemotherapy to treat weakened bones caused by cancer. It works by slowing down the release of calcium from bones. This lowers calcium levels in your blood. It also makes your bones stronger and less likely to break (fracture). It belongs to a group of medications called bisphosphonates. This medicine may be used for other purposes; ask your health care provider or pharmacist if you have questions. COMMON BRAND NAME(S): Zometa, Zometa Powder What should I tell my care team before I take this medication? They need to know if you have any of these conditions: Dehydration Dental disease Kidney disease Liver disease Low levels of calcium in the blood Lung or breathing disease, such as asthma Receiving steroids, such as dexamethasone or prednisone An unusual or allergic reaction to zoledronic acid, other medications, foods, dyes, or preservatives Pregnant or trying to get pregnant Breast-feeding How should I use this medication? This medication is injected into a vein. It is given by your care team in a hospital or clinic setting. Talk to your care team about the use of this medication in children. Special care may be needed. Overdosage: If you think you have taken too much of this medicine contact a poison control center or emergency room at once. NOTE: This medicine is only for you. Do not share this medicine with others. What if I miss a dose? Keep appointments for follow-up doses. It is important not to miss your dose. Call your care team if you are unable to keep an appointment. What may interact with this medication? Certain antibiotics given by injection Diuretics, such as bumetanide, furosemide NSAIDs, medications for pain and inflammation, such as ibuprofen or naproxen Teriparatide Thalidomide This list  may not describe all possible interactions. Give your health care provider a list of all the medicines, herbs, non-prescription drugs, or dietary supplements you use. Also tell them if you smoke, drink alcohol, or use illegal drugs. Some items may interact with your medicine. What should I watch for while using this medication? Visit your care team for regular checks on your progress. It may be some time before you see the benefit from this medication. Some people who take this medication have severe bone, joint, or muscle pain. This medication may also increase your risk for jaw problems or a broken thigh bone. Tell your care team right away if you have severe pain in your jaw, bones, joints, or muscles. Tell you care team if you have any pain that does not go away or that gets worse. Tell your dentist and dental surgeon that you are taking this medication. You should not have major dental surgery while on this medication. See your dentist to have a dental exam and fix any dental problems before starting this medication. Take good care of your teeth while on this medication. Make sure you see your dentist for regular follow-up appointments. You should make sure you get enough calcium and vitamin D while you are taking this medication. Discuss the foods you eat and the vitamins you take with your care team. Check with your care team if you have severe diarrhea, nausea, and vomiting, or if you sweat a lot. The loss of too much body fluid may make it dangerous for you to take this medication. You may need bloodwork while taking this medication. Talk to your care team if   you wish to become pregnant or think you might be pregnant. This medication can cause serious birth defects. What side effects may I notice from receiving this medication? Side effects that you should report to your care team as soon as possible: Allergic reactions--skin rash, itching, hives, swelling of the face, lips, tongue, or  throat Kidney injury--decrease in the amount of urine, swelling of the ankles, hands, or feet Low calcium level--muscle pain or cramps, confusion, tingling, or numbness in the hands or feet Osteonecrosis of the jaw--pain, swelling, or redness in the mouth, numbness of the jaw, poor healing after dental work, unusual discharge from the mouth, visible bones in the mouth Severe bone, joint, or muscle pain Side effects that usually do not require medical attention (report to your care team if they continue or are bothersome): Constipation Fatigue Fever Loss of appetite Nausea Stomach pain This list may not describe all possible side effects. Call your doctor for medical advice about side effects. You may report side effects to FDA at 1-800-FDA-1088. Where should I keep my medication? This medication is given in a hospital or clinic. It will not be stored at home. NOTE: This sheet is a summary. It may not cover all possible information. If you have questions about this medicine, talk to your doctor, pharmacist, or health care provider.  2023 Elsevier/Gold Standard (2021-11-13 00:00:00) Fulvestrant Injection What is this medication? FULVESTRANT (ful VES trant) treats breast cancer. It works by blocking the hormone estrogen in breast tissue, which prevents breast cancer cells from spreading or growing. This medicine may be used for other purposes; ask your health care provider or pharmacist if you have questions. COMMON BRAND NAME(S): FASLODEX What should I tell my care team before I take this medication? They need to know if you have any of these conditions: Bleeding disorder Liver disease Low blood cell levels, such as low white cells, red cells, and platelets An unusual or allergic reaction to fulvestrant, other medications, foods, dyes, or preservatives Pregnant or trying to get pregnant Breast-feeding How should I use this medication? This medication is injected into a muscle. It is  given by your care team in a hospital or clinic setting. Talk to your care team about the use of this medication in children. Special care may be needed. Overdosage: If you think you have taken too much of this medicine contact a poison control center or emergency room at once. NOTE: This medicine is only for you. Do not share this medicine with others. What if I miss a dose? Keep appointments for follow-up doses. It is important not to miss your dose. Call your care team if you are unable to keep an appointment. What may interact with this medication? Certain medications that prevent or treat blood clots, such as warfarin, enoxaparin, dalteparin, apixaban, dabigatran, rivaroxaban This list may not describe all possible interactions. Give your health care provider a list of all the medicines, herbs, non-prescription drugs, or dietary supplements you use. Also tell them if you smoke, drink alcohol, or use illegal drugs. Some items may interact with your medicine. What should I watch for while using this medication? Your condition will be monitored carefully while you are receiving this medication. You may need blood work while taking this medication. Talk to your care team if you may be pregnant. Serious birth defects can occur if you take this medication during pregnancy and for 1 year after the last dose. You will need a negative pregnancy test before starting this medication. Contraception  is recommended while taking this medication and for 1 year after the last dose. Your care team can help you find the option that works for you. Do not breastfeed while taking this medication and for 1 year after the last dose. This medication may cause infertility. Talk to your care team if you are concerned about your fertility. What side effects may I notice from receiving this medication? Side effects that you should report to your care team as soon as possible: Allergic reactions or angioedema--skin rash,  itching or hives, swelling of the face, eyes, lips, tongue, arms, or legs, trouble swallowing or breathing Pain, tingling, or numbness in the hands or feet Side effects that usually do not require medical attention (report to your care team if they continue or are bothersome): Bone, joint, or muscle pain Constipation Headache Hot flashes Nausea Pain, redness, or irritation at injection site Unusual weakness or fatigue This list may not describe all possible side effects. Call your doctor for medical advice about side effects. You may report side effects to FDA at 1-800-FDA-1088. Where should I keep my medication? This medication is given in a hospital or clinic. It will not be stored at home. NOTE: This sheet is a summary. It may not cover all possible information. If you have questions about this medicine, talk to your doctor, pharmacist, or health care provider.  2023 Elsevier/Gold Standard (2022-02-09 00:00:00)

## 2022-11-12 LAB — CANCER ANTIGEN 27.29: CA 27.29: 121.4 U/mL — ABNORMAL HIGH (ref 0.0–38.6)

## 2022-11-20 ENCOUNTER — Other Ambulatory Visit: Payer: Self-pay | Admitting: Hematology & Oncology

## 2022-11-20 DIAGNOSIS — C50919 Malignant neoplasm of unspecified site of unspecified female breast: Secondary | ICD-10-CM

## 2022-11-23 ENCOUNTER — Other Ambulatory Visit: Payer: Self-pay | Admitting: Hematology & Oncology

## 2022-11-23 DIAGNOSIS — C7951 Secondary malignant neoplasm of bone: Secondary | ICD-10-CM

## 2022-11-23 DIAGNOSIS — C50919 Malignant neoplasm of unspecified site of unspecified female breast: Secondary | ICD-10-CM

## 2022-11-24 MED ORDER — OXYCODONE HCL 5 MG PO TABS: 5.0000 mg | ORAL_TABLET | ORAL | 0 refills | Status: DC | PRN

## 2022-11-24 NOTE — Telephone Encounter (Signed)
Last refilled 11/02/21 #180. Please advise for refills.

## 2022-12-08 ENCOUNTER — Other Ambulatory Visit: Payer: Self-pay | Admitting: Hematology & Oncology

## 2022-12-08 DIAGNOSIS — C7951 Secondary malignant neoplasm of bone: Secondary | ICD-10-CM

## 2022-12-08 DIAGNOSIS — C50919 Malignant neoplasm of unspecified site of unspecified female breast: Secondary | ICD-10-CM

## 2022-12-08 MED ORDER — FENTANYL 50 MCG/HR TD PT72: 1.0000 | MEDICATED_PATCH | TRANSDERMAL | 0 refills | Status: DC

## 2022-12-08 MED ORDER — OXYCODONE HCL 5 MG PO TABS: 5.0000 mg | ORAL_TABLET | ORAL | 0 refills | Status: DC | PRN

## 2022-12-09 ENCOUNTER — Inpatient Hospital Stay: Payer: BC Managed Care – PPO

## 2022-12-09 ENCOUNTER — Inpatient Hospital Stay: Payer: BC Managed Care – PPO | Attending: Hematology & Oncology

## 2022-12-09 ENCOUNTER — Inpatient Hospital Stay (HOSPITAL_BASED_OUTPATIENT_CLINIC_OR_DEPARTMENT_OTHER): Payer: BC Managed Care – PPO | Admitting: Family

## 2022-12-09 ENCOUNTER — Other Ambulatory Visit: Payer: Self-pay

## 2022-12-09 ENCOUNTER — Encounter: Payer: Self-pay | Admitting: Hematology & Oncology

## 2022-12-09 VITALS — BP 158/76 | HR 103 | Temp 98.5°F | Resp 19 | Ht 68.0 in | Wt 159.0 lb

## 2022-12-09 DIAGNOSIS — C7951 Secondary malignant neoplasm of bone: Secondary | ICD-10-CM | POA: Insufficient documentation

## 2022-12-09 DIAGNOSIS — Z170 Estrogen receptor positive status [ER+]: Secondary | ICD-10-CM | POA: Insufficient documentation

## 2022-12-09 DIAGNOSIS — C50919 Malignant neoplasm of unspecified site of unspecified female breast: Secondary | ICD-10-CM | POA: Diagnosis not present

## 2022-12-09 DIAGNOSIS — Z5111 Encounter for antineoplastic chemotherapy: Secondary | ICD-10-CM | POA: Insufficient documentation

## 2022-12-09 DIAGNOSIS — D500 Iron deficiency anemia secondary to blood loss (chronic): Secondary | ICD-10-CM

## 2022-12-09 LAB — CBC WITH DIFFERENTIAL (CANCER CENTER ONLY)
Abs Immature Granulocytes: 1.02 10*3/uL — ABNORMAL HIGH (ref 0.00–0.07)
Basophils Absolute: 0.1 10*3/uL (ref 0.0–0.1)
Basophils Relative: 1 %
Eosinophils Absolute: 0.6 10*3/uL — ABNORMAL HIGH (ref 0.0–0.5)
Eosinophils Relative: 6 %
HCT: 34.3 % — ABNORMAL LOW (ref 36.0–46.0)
Hemoglobin: 10.7 g/dL — ABNORMAL LOW (ref 12.0–15.0)
Immature Granulocytes: 10 %
Lymphocytes Relative: 8 %
Lymphs Abs: 0.8 10*3/uL (ref 0.7–4.0)
MCH: 28.5 pg (ref 26.0–34.0)
MCHC: 31.2 g/dL (ref 30.0–36.0)
MCV: 91.2 fL (ref 80.0–100.0)
Monocytes Absolute: 0.6 10*3/uL (ref 0.1–1.0)
Monocytes Relative: 5 %
Neutro Abs: 7.3 10*3/uL (ref 1.7–7.7)
Neutrophils Relative %: 70 %
Platelet Count: 231 10*3/uL (ref 150–400)
RBC: 3.76 MIL/uL — ABNORMAL LOW (ref 3.87–5.11)
RDW: 16.1 % — ABNORMAL HIGH (ref 11.5–15.5)
Smear Review: NORMAL
WBC Count: 10.4 10*3/uL (ref 4.0–10.5)
nRBC: 0.5 % — ABNORMAL HIGH (ref 0.0–0.2)

## 2022-12-09 LAB — CMP (CANCER CENTER ONLY)
ALT: 12 U/L (ref 0–44)
AST: 20 U/L (ref 15–41)
Albumin: 4.1 g/dL (ref 3.5–5.0)
Alkaline Phosphatase: 134 U/L — ABNORMAL HIGH (ref 38–126)
Anion gap: 12 (ref 5–15)
BUN: 16 mg/dL (ref 6–20)
CO2: 29 mmol/L (ref 22–32)
Calcium: 10 mg/dL (ref 8.9–10.3)
Chloride: 101 mmol/L (ref 98–111)
Creatinine: 0.79 mg/dL (ref 0.44–1.00)
GFR, Estimated: >60 mL/min (ref >60)
Glucose, Bld: 213 mg/dL — ABNORMAL HIGH (ref 70–99)
Potassium: 4.4 mmol/L (ref 3.5–5.1)
Sodium: 142 mmol/L (ref 135–145)
Total Bilirubin: 0.3 mg/dL (ref 0.3–1.2)
Total Protein: 7 g/dL (ref 6.5–8.1)

## 2022-12-09 LAB — LACTATE DEHYDROGENASE: LDH: 244 U/L — ABNORMAL HIGH (ref 98–192)

## 2022-12-09 MED ORDER — FULVESTRANT 250 MG/5ML IM SOSY
500.0000 mg | PREFILLED_SYRINGE | Freq: Once | INTRAMUSCULAR | Status: AC
  Administered 2022-12-09: 500 mg via INTRAMUSCULAR
  Filled 2022-12-09: qty 10

## 2022-12-09 NOTE — Progress Notes (Signed)
Hematology and Oncology Follow Up Visit  Annette James [id number] 25-Nov-1960 62 y.o. 12/09/2022   Principle Diagnosis:  Metastatic breast cancer-bone only metastasis-ER positive/PR positive/HER2 LOW ///  PIK3CA (+)   Past Therapy:        Ibrance 125 MG PO Q DAY (21 days on/7 days off) - d/c on 08/12/2022 XRT to the RIGHT hip  --completed on 07/24/2021 XRT to T12 -- completed on 07/14/2022  Current Therapy: Faslodex 500 mg IM monthly-start on 11/06/2020 Piqray 300 mg po q day -- start on 08/24/2022 Zometa 4 mg IV Q3 months -- next dose on 02/2023   Interim History:  Annette James is here today for follow-up. She is doing quite well and has no complaints at this time.  She has been eating better and states that she drinks a protein drink every morning with her breakfast. She is staying well hydrated with water and Gatorade zero.  Weight is stable at 159 lbs.  No adenopathy or lymphedema noted on exam.  CA 27.29 last month was 121.  No fever, chills, n/v, cough, rash, dizziness, SOB, chest pain, palpitations, abdominal pain or changes in bowel or bladder habits. She has not had any issues with diarrhea.  No falls or syncope reported.  No swelling numbness or tingling in her extremities. She has generalized joint aches and pains.  No blood loss, abnormal bruising or petechiae noted.  She has been getting out and walking for exercise.   ECOG Performance Status: 1 - Symptomatic but completely ambulatory  Medications:  Allergies as of 12/09/2022   No Known Allergies      Medication List        Accurate as of December 09, 2022  9:19 AM. If you have any questions, ask your nurse or doctor.          ascorbic acid 500 MG tablet Commonly known as: VITAMIN C Take 500 mg by mouth daily.   cyclobenzaprine 5 MG tablet Commonly known as: FLEXERIL TAKE 1 TABLET BY MOUTH THREE TIMES A DAY AS NEEDED FOR MUSCLE SPASM   fentaNYL 50 MCG/HR Commonly known as: [place] 1  patch onto the skin every other day.   glimepiride 2 MG tablet Commonly known as: Amaryl Take 1 tablet (2 mg total) by mouth daily with breakfast.   ondansetron 8 MG tablet Commonly known as: Zofran Take 1 tablet (8 mg total) by mouth every 8 (eight) hours as needed for nausea or vomiting.   One-A-Day Womens 50 Plus Tabs Take 1 tablet by mouth daily.   oxyCODONE 5 MG immediate release tablet Commonly known as: Oxy IR/ROXICODONE Take 1-2 tablets (5-10 mg total) by mouth every 4 (four) hours as needed.   Piqray (300 MG Daily Dose) tablet Generic drug: alpelisib 300 mg daily dose (2 X 150 mg oral tablets) TAKE 2 TABLETS (300 MG) DAILY WITH FOOD   prochlorperazine 10 MG tablet Commonly known as: COMPAZINE Take 1 tablet (10 mg total) by mouth every 6 (six) hours as needed for nausea or vomiting.        Allergies: No Known Allergies  Past Medical History, Surgical history, Social history, and Family History were reviewed and updated.  Review of Systems: All other 10 point review of systems is negative.   Physical Exam:  height is '5\' 8"'$  (1.727 m) and weight is 159 lb (72.1 kg). Her oral temperature is 98.5 F (36.9 C). Her blood pressure is 158/76 (abnormal) and her pulse is 103 (abnormal). Her respiration is  19 and oxygen saturation is 99%.   Wt Readings from Last 3 Encounters:  12/09/22 159 lb (72.1 kg)  11/11/22 162 lb 1.9 oz (73.5 kg)  10/14/22 159 lb 6.4 oz (72.3 kg)    Ocular: Sclerae unicteric, pupils equal, round and reactive to light Ear-nose-throat: Oropharynx clear, dentition fair Lymphatic: No cervical, supraclavicular or axillary adenopathy Lungs no rales or rhonchi, good excursion bilaterally Heart regular rate and rhythm, no murmur appreciated Abd soft, nontender, positive bowel sounds MSK no focal spinal tenderness, no joint edema Neuro: non-focal, well-oriented, appropriate affect Breasts: No changes per patient.   Lab Results  Component Value Date    WBC 10.4 12/09/2022   HGB 10.7 (L) 12/09/2022   HCT 34.3 (L) 12/09/2022   MCV 91.2 12/09/2022   PLT 231 12/09/2022   Lab Results  Component Value Date   FERRITIN 425 (H) 11/11/2022   IRON 56 11/11/2022   TIBC 330 11/11/2022   UIBC 274 11/11/2022   IRONPCTSAT 17 11/11/2022   Lab Results  Component Value Date   RETICCTPCT 2.4 11/11/2022   RBC 3.76 (L) 12/09/2022   Lab Results  Component Value Date   KAPLAMBRATIO 4.99 10/20/2020   Lab Results  Component Value Date   IGGSERUM 714 10/19/2020   IGA 153 10/19/2020   IGMSERUM 76 10/19/2020   Lab Results  Component Value Date   TOTALPROTELP 6.1 10/19/2020     Chemistry      Component Value Date/Time   NA 142 12/09/2022 0829   K 4.4 12/09/2022 0829   CL 101 12/09/2022 0829   CO2 29 12/09/2022 0829   BUN 16 12/09/2022 0829   CREATININE 0.79 12/09/2022 0829      Component Value Date/Time   CALCIUM 10.0 12/09/2022 0829   ALKPHOS 134 (H) 12/09/2022 0829   AST 20 12/09/2022 0829   ALT 12 12/09/2022 0829   BILITOT 0.3 12/09/2022 0829       Impression and Plan: Annette James is a very pleasant 62 yo caucasian female with metastatic breast cancer confined at this time to her bones diagnosed in December 2021.  We will proceed with Faslodex injectio today as planned per MD.  CA 27.29 pending.  She will continue her same regimen with Piqray.  Follow-up in 1 month.   Lottie Dawson, NP 2/28/20249:19 AM

## 2022-12-09 NOTE — Patient Instructions (Signed)

## 2022-12-10 LAB — CANCER ANTIGEN 27.29: CA 27.29: 141.6 U/mL — ABNORMAL HIGH (ref 0.0–38.6)

## 2022-12-14 ENCOUNTER — Other Ambulatory Visit: Payer: Self-pay | Admitting: Hematology & Oncology

## 2022-12-14 DIAGNOSIS — C50919 Malignant neoplasm of unspecified site of unspecified female breast: Secondary | ICD-10-CM

## 2022-12-28 ENCOUNTER — Other Ambulatory Visit: Payer: Self-pay | Admitting: Hematology & Oncology

## 2022-12-28 DIAGNOSIS — C50919 Malignant neoplasm of unspecified site of unspecified female breast: Secondary | ICD-10-CM

## 2022-12-28 MED ORDER — OXYCODONE HCL 5 MG PO TABS: 5.0000 mg | ORAL_TABLET | ORAL | 0 refills | Status: DC | PRN

## 2022-12-30 ENCOUNTER — Encounter: Payer: Self-pay | Admitting: *Deleted

## 2022-12-30 MED ORDER — METHYLPREDNISOLONE 4 MG PO TBPK: ORAL_TABLET | ORAL | 0 refills | Status: DC

## 2022-12-30 NOTE — Telephone Encounter (Signed)
Called pt - per MD, Medrol dose pk called into pharmacy for joint aches and pains. If pain continues, pt may need a daily low dose of prednisone. MD will discuss with pt at next office visit.

## 2023-01-03 ENCOUNTER — Inpatient Hospital Stay (HOSPITAL_COMMUNITY): Payer: BC Managed Care – PPO

## 2023-01-03 ENCOUNTER — Inpatient Hospital Stay (HOSPITAL_COMMUNITY)
Admission: EM | Admit: 2023-01-03 | Discharge: 2023-01-08 | DRG: 940 | Disposition: A | Discharge status: Home / Self Care | Payer: BC Managed Care – PPO | Attending: Internal Medicine | Admitting: Internal Medicine

## 2023-01-03 ENCOUNTER — Other Ambulatory Visit: Payer: Self-pay

## 2023-01-03 DIAGNOSIS — C7951 Secondary malignant neoplasm of bone: Secondary | ICD-10-CM | POA: Diagnosis not present

## 2023-01-03 DIAGNOSIS — E876 Hypokalemia: Secondary | ICD-10-CM | POA: Diagnosis present

## 2023-01-03 DIAGNOSIS — E871 Hypo-osmolality and hyponatremia: Secondary | ICD-10-CM | POA: Diagnosis not present

## 2023-01-03 DIAGNOSIS — Z79899 Other long term (current) drug therapy: Secondary | ICD-10-CM

## 2023-01-03 DIAGNOSIS — Z7984 Long term (current) use of oral hypoglycemic drugs: Secondary | ICD-10-CM

## 2023-01-03 DIAGNOSIS — N83202 Unspecified ovarian cyst, left side: Secondary | ICD-10-CM | POA: Diagnosis present

## 2023-01-03 DIAGNOSIS — C419 Malignant neoplasm of bone and articular cartilage, unspecified: Secondary | ICD-10-CM | POA: Diagnosis not present

## 2023-01-03 DIAGNOSIS — Z853 Personal history of malignant neoplasm of breast: Secondary | ICD-10-CM | POA: Diagnosis not present

## 2023-01-03 DIAGNOSIS — Z923 Personal history of irradiation: Secondary | ICD-10-CM | POA: Diagnosis not present

## 2023-01-03 DIAGNOSIS — Z803 Family history of malignant neoplasm of breast: Secondary | ICD-10-CM

## 2023-01-03 DIAGNOSIS — G893 Neoplasm related pain (acute) (chronic): Principal | ICD-10-CM | POA: Diagnosis present

## 2023-01-03 DIAGNOSIS — E669 Obesity, unspecified: Secondary | ICD-10-CM | POA: Diagnosis present

## 2023-01-03 DIAGNOSIS — K769 Liver disease, unspecified: Secondary | ICD-10-CM | POA: Diagnosis not present

## 2023-01-03 DIAGNOSIS — T380X5A Adverse effect of glucocorticoids and synthetic analogues, initial encounter: Secondary | ICD-10-CM | POA: Diagnosis present

## 2023-01-03 DIAGNOSIS — Z9049 Acquired absence of other specified parts of digestive tract: Secondary | ICD-10-CM

## 2023-01-03 DIAGNOSIS — R000 Tachycardia, unspecified: Secondary | ICD-10-CM | POA: Diagnosis not present

## 2023-01-03 DIAGNOSIS — D630 Anemia in neoplastic disease: Secondary | ICD-10-CM | POA: Diagnosis present

## 2023-01-03 DIAGNOSIS — R748 Abnormal levels of other serum enzymes: Secondary | ICD-10-CM | POA: Diagnosis not present

## 2023-01-03 DIAGNOSIS — D72829 Elevated white blood cell count, unspecified: Secondary | ICD-10-CM | POA: Diagnosis not present

## 2023-01-03 DIAGNOSIS — C229 Malignant neoplasm of liver, not specified as primary or secondary: Secondary | ICD-10-CM | POA: Diagnosis not present

## 2023-01-03 DIAGNOSIS — C50919 Malignant neoplasm of unspecified site of unspecified female breast: Secondary | ICD-10-CM

## 2023-01-03 DIAGNOSIS — D500 Iron deficiency anemia secondary to blood loss (chronic): Secondary | ICD-10-CM

## 2023-01-03 DIAGNOSIS — J90 Pleural effusion, not elsewhere classified: Secondary | ICD-10-CM | POA: Diagnosis present

## 2023-01-03 DIAGNOSIS — E861 Hypovolemia: Secondary | ICD-10-CM | POA: Diagnosis not present

## 2023-01-03 DIAGNOSIS — C787 Secondary malignant neoplasm of liver and intrahepatic bile duct: Secondary | ICD-10-CM | POA: Diagnosis not present

## 2023-01-03 DIAGNOSIS — E1165 Type 2 diabetes mellitus with hyperglycemia: Secondary | ICD-10-CM | POA: Diagnosis not present

## 2023-01-03 DIAGNOSIS — R978 Other abnormal tumor markers: Secondary | ICD-10-CM | POA: Diagnosis not present

## 2023-01-03 DIAGNOSIS — R739 Hyperglycemia, unspecified: Secondary | ICD-10-CM | POA: Diagnosis not present

## 2023-01-03 DIAGNOSIS — D649 Anemia, unspecified: Secondary | ICD-10-CM | POA: Diagnosis not present

## 2023-01-03 DIAGNOSIS — Z0189 Encounter for other specified special examinations: Secondary | ICD-10-CM | POA: Diagnosis not present

## 2023-01-03 DIAGNOSIS — Z515 Encounter for palliative care: Secondary | ICD-10-CM

## 2023-01-03 DIAGNOSIS — Z452 Encounter for adjustment and management of vascular access device: Secondary | ICD-10-CM | POA: Diagnosis not present

## 2023-01-03 LAB — COMPREHENSIVE METABOLIC PANEL
ALT: 45 U/L — ABNORMAL HIGH (ref 0–44)
AST: 166 U/L — ABNORMAL HIGH (ref 15–41)
Albumin: 2.7 g/dL — ABNORMAL LOW (ref 3.5–5.0)
Alkaline Phosphatase: 249 U/L — ABNORMAL HIGH (ref 38–126)
Anion gap: 13 (ref 5–15)
BUN: 17 mg/dL (ref 6–20)
CO2: 21 mmol/L — ABNORMAL LOW (ref 22–32)
Calcium: 8.6 mg/dL — ABNORMAL LOW (ref 8.9–10.3)
Chloride: 95 mmol/L — ABNORMAL LOW (ref 98–111)
Creatinine, Ser: 0.71 mg/dL (ref 0.44–1.00)
GFR, Estimated: >60 mL/min (ref >60)
Glucose, Bld: 391 mg/dL — ABNORMAL HIGH (ref 70–99)
Potassium: 3.9 mmol/L (ref 3.5–5.1)
Sodium: 129 mmol/L — ABNORMAL LOW (ref 135–145)
Total Bilirubin: 1 mg/dL (ref 0.3–1.2)
Total Protein: 6.6 g/dL (ref 6.5–8.1)

## 2023-01-03 LAB — CBC WITH DIFFERENTIAL/PLATELET
Abs Immature Granulocytes: 1.3 10*3/uL — ABNORMAL HIGH (ref 0.00–0.07)
Basophils Absolute: 0.1 10*3/uL (ref 0.0–0.1)
Basophils Relative: 1 %
Eosinophils Absolute: 0 10*3/uL (ref 0.0–0.5)
Eosinophils Relative: 0 %
HCT: 34.3 % — ABNORMAL LOW (ref 36.0–46.0)
Hemoglobin: 10.7 g/dL — ABNORMAL LOW (ref 12.0–15.0)
Immature Granulocytes: 9 %
Lymphocytes Relative: 2 %
Lymphs Abs: 0.4 10*3/uL — ABNORMAL LOW (ref 0.7–4.0)
MCH: 26.9 pg (ref 26.0–34.0)
MCHC: 31.2 g/dL (ref 30.0–36.0)
MCV: 86.2 fL (ref 80.0–100.0)
Monocytes Absolute: 1.2 10*3/uL — ABNORMAL HIGH (ref 0.1–1.0)
Monocytes Relative: 8 %
Neutro Abs: 11.8 10*3/uL — ABNORMAL HIGH (ref 1.7–7.7)
Neutrophils Relative %: 80 %
Platelets: 251 10*3/uL (ref 150–400)
RBC: 3.98 MIL/uL (ref 3.87–5.11)
RDW: 17.4 % — ABNORMAL HIGH (ref 11.5–15.5)
WBC: 14.8 10*3/uL — ABNORMAL HIGH (ref 4.0–10.5)
nRBC: 1.2 % — ABNORMAL HIGH (ref 0.0–0.2)

## 2023-01-03 LAB — CBC
HCT: 29.9 % — ABNORMAL LOW (ref 36.0–46.0)
Hemoglobin: 9.6 g/dL — ABNORMAL LOW (ref 12.0–15.0)
MCH: 27.3 pg (ref 26.0–34.0)
MCHC: 32.1 g/dL (ref 30.0–36.0)
MCV: 84.9 fL (ref 80.0–100.0)
Platelets: 225 10*3/uL (ref 150–400)
RBC: 3.52 MIL/uL — ABNORMAL LOW (ref 3.87–5.11)
RDW: 17.5 % — ABNORMAL HIGH (ref 11.5–15.5)
WBC: 14.6 10*3/uL — ABNORMAL HIGH (ref 4.0–10.5)
nRBC: 1.2 % — ABNORMAL HIGH (ref 0.0–0.2)

## 2023-01-03 LAB — GLUCOSE, CAPILLARY
Glucose-Capillary: 246 mg/dL — ABNORMAL HIGH (ref 70–99)
Glucose-Capillary: 289 mg/dL — ABNORMAL HIGH (ref 70–99)

## 2023-01-03 LAB — CREATININE, SERUM
Creatinine, Ser: 0.68 mg/dL (ref 0.44–1.00)
GFR, Estimated: >60 mL/min (ref >60)

## 2023-01-03 MED ORDER — HYDROMORPHONE HCL 1 MG/ML IJ SOLN
1.0000 mg | Freq: Once | INTRAMUSCULAR | Status: AC
  Administered 2023-01-03: 1 mg via INTRAVENOUS
  Filled 2023-01-03: qty 1

## 2023-01-03 MED ORDER — ONDANSETRON HCL 4 MG/2ML IJ SOLN
4.0000 mg | Freq: Four times a day (QID) | INTRAMUSCULAR | Status: DC | PRN
  Administered 2023-01-04 (×2): 4 mg via INTRAVENOUS
  Filled 2023-01-03 (×2): qty 2

## 2023-01-03 MED ORDER — SODIUM CHLORIDE 0.9 % IV BOLUS
1000.0000 mL | Freq: Once | INTRAVENOUS | Status: AC
  Administered 2023-01-03: 1000 mL via INTRAVENOUS

## 2023-01-03 MED ORDER — POLYETHYLENE GLYCOL 3350 17 G PO PACK
17.0000 g | PACK | Freq: Every day | ORAL | Status: DC
  Administered 2023-01-04: 17 g via ORAL
  Filled 2023-01-03: qty 1

## 2023-01-03 MED ORDER — ONDANSETRON HCL 4 MG/2ML IJ SOLN
4.0000 mg | Freq: Once | INTRAMUSCULAR | Status: AC
  Administered 2023-01-03: 4 mg via INTRAVENOUS
  Filled 2023-01-03: qty 2

## 2023-01-03 MED ORDER — OXYCODONE HCL 5 MG PO TABS
5.0000 mg | ORAL_TABLET | ORAL | Status: DC | PRN
  Administered 2023-01-04: 5 mg via ORAL
  Filled 2023-01-03: qty 1

## 2023-01-03 MED ORDER — ACETAMINOPHEN 650 MG RE SUPP: 650.0000 mg | Freq: Four times a day (QID) | RECTAL | Status: DC | PRN

## 2023-01-03 MED ORDER — MORPHINE SULFATE (PF) 2 MG/ML IV SOLN
2.0000 mg | INTRAVENOUS | Status: DC | PRN
  Administered 2023-01-03 – 2023-01-07 (×10): 2 mg via INTRAVENOUS
  Filled 2023-01-03 (×10): qty 1

## 2023-01-03 MED ORDER — SENNA 8.6 MG PO TABS
1.0000 | ORAL_TABLET | Freq: Two times a day (BID) | ORAL | Status: DC
  Administered 2023-01-03 – 2023-01-08 (×8): 8.6 mg via ORAL
  Filled 2023-01-03 (×8): qty 1

## 2023-01-03 MED ORDER — IOHEXOL 350 MG/ML SOLN
75.0000 mL | Freq: Once | INTRAVENOUS | Status: AC | PRN
  Administered 2023-01-03: 75 mL via INTRAVENOUS

## 2023-01-03 MED ORDER — ENOXAPARIN SODIUM 40 MG/0.4ML IJ SOSY
40.0000 mg | PREFILLED_SYRINGE | INTRAMUSCULAR | Status: DC
  Administered 2023-01-03 – 2023-01-07 (×4): 40 mg via SUBCUTANEOUS
  Filled 2023-01-03 (×4): qty 0.4

## 2023-01-03 MED ORDER — ACETAMINOPHEN 325 MG PO TABS
650.0000 mg | ORAL_TABLET | Freq: Four times a day (QID) | ORAL | Status: DC | PRN
  Administered 2023-01-03: 650 mg via ORAL
  Filled 2023-01-03: qty 2

## 2023-01-03 MED ORDER — ALPELISIB (300 MG DAILY DOSE) 2 X 150 MG PO TBPK: 300.0000 mg | ORAL_TABLET | Freq: Every day | ORAL | Status: DC

## 2023-01-03 MED ORDER — INSULIN ASPART 100 UNIT/ML IJ SOLN
0.0000 [IU] | INTRAMUSCULAR | Status: DC
  Administered 2023-01-03: 12 [IU] via SUBCUTANEOUS
  Administered 2023-01-04: 8 [IU] via SUBCUTANEOUS
  Administered 2023-01-04: 4 [IU] via SUBCUTANEOUS

## 2023-01-03 MED ORDER — ONDANSETRON HCL 4 MG PO TABS: 4.0000 mg | ORAL_TABLET | Freq: Four times a day (QID) | ORAL | Status: DC | PRN

## 2023-01-03 NOTE — H&P (Signed)
History and Physical    Annette James [id number] DOB: 1961/09/14 DOA: 01/03/2023  PCP: [name], MD   Chief Complaint: pain  HPI: Annette James is a 62 y.o. female with medical history significant of metastatic breast cancer who presented to the department due to uncontrolled pain.  Patient states that she has been having uncontrolled pain in her joints upper back and shoulders.  She saw her oncologist who gave her steroid pack without improvement.  She takes fentanyl and oxycodone as an outpatient.  Due to uncontrolled pain she presented to the ER.  On arrival she was found to be tachycardic in the 120s and hemodynamically stable.  Labs were obtained which revealed sodium 129, bicarb 21, glucose 391, AST 166, ALT 45, alkaline phosphatase 249, WBC 14.8, hemoglobin 10.7, CT chest abdomen pelvis was obtained which demonstrated diffuse skeletal metastasis including new lesions in the sternum, and new pleural effusion and new liver mets.  Patient was given IV analgesia with persistent pain so was admitted for pain management.  On admission she was resting comfortably.  She endorsed persistent pain in her back and requests new pain meds.  She denied any infectious complaints and she did not have any shortness of breath or chest pain.   Review of Systems: Review of Systems  All other systems reviewed and are negative.    As per HPI otherwise 10 point review of systems negative.   No Known Allergies  Past Medical History:  Diagnosis Date   Breast cancer metastasized to bone, unspecified laterality ([place]) 11/01/2020   radiation txt to spine 11/13/2020-11/29/2020  Dr Sondra Come   Chicken pox    Class 1 obesity 12/16/2020   Colitis 12/2020   Goals of care, counseling/discussion 10/19/2020   History of radiation therapy 11/13/2020-11/29/2020   left hip   Dr Gery Pray   History of radiation therapy    right hip 07/07/2021-07/24/2021  Dr Gery Pray   History of radiation therapy     Right Pelvis(Hip) 11/10/21-11/21/21- Dr. Gery Pray   History of radiation therapy    Thoracic Spine 07/01/22-07/14/22- Dr. Gery Pray   Metastatic cancer to spine [facility]) 10/19/2020    Past Surgical History:  Procedure Laterality Date   APPENDECTOMY     CESAREAN SECTION     x 4   CHOLECYSTECTOMY       reports that she has never smoked. She has never used smokeless tobacco. She reports that she does not currently use alcohol. She reports that she does not currently use drugs.  Family History  Problem Relation Age of Onset   Breast cancer Mother    Hypertension Mother    Colon cancer Father    Hypertension Father    Alcoholism Brother    Alcoholism Brother     Prior to Admission medications   Medication Sig Start Date End Date Taking? Authorizing Provider  ascorbic acid (VITAMIN C) 500 MG tablet Take 500 mg by mouth daily.    [provider]  cyclobenzaprine (FLEXERIL) 5 MG tablet TAKE 1 TABLET BY MOUTH THREE TIMES A DAY AS NEEDED FOR MUSCLE SPASM 11/23/22   Volanda Napoleon, MD  fentaNYL (DURAGESIC) 50 MCG/HR Place 1 patch onto the skin every other day. 12/08/22 06/06/23  Volanda Napoleon, MD  glimepiride (AMARYL) 2 MG tablet Take 1 tablet (2 mg total) by mouth daily with breakfast. 10/14/22   Volanda Napoleon, MD  methylPREDNISolone (MEDROL) 4 MG TBPK tablet Take as directed 12/30/22   Ennever,  Rudell Cobb, MD  Multiple Vitamins-Minerals (ONE-A-DAY WOMENS 50 PLUS) TABS Take 1 tablet by mouth daily.    [provider]  ondansetron (ZOFRAN) 8 MG tablet Take 1 tablet (8 mg total) by mouth every 8 (eight) hours as needed for nausea or vomiting. 12/18/21   Volanda Napoleon, MD  oxyCODONE (OXY IR/ROXICODONE) 5 MG immediate release tablet Take 1-2 tablets (5-10 mg total) by mouth every 4 (four) hours as needed. 12/28/22 06/26/23  Volanda Napoleon, MD  PIQRAY, 300 MG DAILY DOSE, tablet TAKE 2 TABLETS (300 MG) DAILY WITH FOOD 12/15/22   Volanda Napoleon, MD  prochlorperazine  (COMPAZINE) 10 MG tablet Take 1 tablet (10 mg total) by mouth every 6 (six) hours as needed for nausea or vomiting. 07/20/22   Maryanna Shape, NP    Physical Exam: Vitals:   01/03/23 1600 01/03/23 1700 01/03/23 1800 01/03/23 2028  BP: (!) 165/83 (!) 162/89 (!) 150/85 (!) 158/86  Pulse: 100 (!) 107 (!) 110 (!) 104  Resp:   20   Temp:    (!) 102 F (38.9 C)  TempSrc:    Oral  SpO2: 98% 97% 97% 98%  Weight:      Height:       Physical Exam Vitals reviewed.  Constitutional:      Appearance: She is obese.  HENT:     Nose: Nose normal.     Mouth/Throat:     Mouth: Mucous membranes are moist.     Pharynx: Oropharynx is clear.  Eyes:     Pupils: Pupils are equal, round, and reactive to light.  Cardiovascular:     Rate and Rhythm: Regular rhythm. Tachycardia present.  Pulmonary:     Effort: Pulmonary effort is normal.  Abdominal:     General: Abdomen is flat. Bowel sounds are normal.  Musculoskeletal:        General: Normal range of motion.     Cervical back: Normal range of motion.  Skin:    General: Skin is warm.     Capillary Refill: Capillary refill takes less than 2 seconds.  Neurological:     General: No focal deficit present.     Mental Status: She is alert.  Psychiatric:        Mood and Affect: Mood normal.       Labs on Admission: I have personally reviewed the patients's labs and imaging studies.  Assessment/Plan Principal Problem:   Cancer-related breakthrough pain -Diffuse metastatic breast cancer uncontrolled on fentanyl patch and p.o. oxycodone - Required numerous doses of IV analgesia in the emergency department - CT scan with new mets to sternum likely contributing to her pain  Plan: IV morphine as needed every 2 hours As needed Tylenol PO oxy  Hyponatremia-likely due to hypovolemia, status post IV fluids  Hyperglycemia-placed on corrective insulin  Leukocytosis-trend CBC  Chronic anemia-trend CBC  Admission status: Inpatient  Med-Surg  Certification: The appropriate patient status for this patient is INPATIENT. Inpatient status is judged to be reasonable and necessary in order to provide the required intensity of service to ensure the patient's safety. The patient's presenting symptoms, physical exam findings, and initial radiographic and laboratory data in the context of their chronic comorbidities is felt to place them at high risk for further clinical deterioration. Furthermore, it is not anticipated that the patient will be medically stable for discharge from the hospital within 2 midnights of admission.   * I certify that at the point of admission it is my  clinical judgment that the patient will require inpatient hospital care spanning beyond 2 midnights from the point of admission due to high intensity of service, high risk for further deterioration and high frequency of surveillance required.Emilee Hero MD [facility] If 7PM-7AM, please contact night-coverage [email or web address]  01/03/2023, 8:29 PM

## 2023-01-03 NOTE — ED Notes (Signed)
Provider at bedside

## 2023-01-03 NOTE — ED Notes (Signed)
Family informed they will need to bring Golva from home.

## 2023-01-03 NOTE — ED Notes (Signed)
ED TO INPATIENT HANDOFF REPORT  ED Nurse Name and Phone #: Duanne Guess RN [phone]  S Name/Age/Gender Annette James 62 y.o. female Room/Bed: 029C/029C  Code Status   Code Status: Full Code  Home/SNF/Other Home Patient oriented to: self, place, time, and situation Is this baseline? Yes   Triage Complete: Triage complete  Chief Complaint Cancer-related breakthrough pain [G89.3]  Triage Note Pain in neck, shoulders, knees, hips for a few weeks-- with nausea/vomiting/weakness/--   Hx-- Cancer with bone mets-- has an appt with Dr. Marin Olp on Tuesday (Oncology) receiving chemo once a month Took Zofran Friday--    Allergies No Known Allergies  Level of Care/Admitting Diagnosis ED Disposition     ED Disposition  [place]: [place] [[id number]]  Level of Care: Med-Surg [16]  May admit patient to [facility] or [facility] if equivalent level of care is available:: Yes  Covid Evaluation: Asymptomatic - no recent exposure (last 10 days) testing not required  Diagnosis: Cancer-related breakthrough pain [id number]  Admitting Physician: Emilee Hero [id number]  Attending Physician: Emilee Hero [id number]  Certification:: I certify this patient will need inpatient services for at least 2 midnights  Estimated Length of Stay: 2          B Medical/Surgery History Past Medical History:  Diagnosis Date   Breast cancer metastasized to bone, unspecified laterality ([place]) 11/01/2020   radiation txt to spine 11/13/2020-11/29/2020  Dr Sondra Come   Chicken pox    Class 1 obesity 12/16/2020   Colitis 12/2020   Goals of care, counseling/discussion 10/19/2020   History of radiation therapy 11/13/2020-11/29/2020   left hip   Dr Gery Pray   History of radiation therapy    right hip 07/07/2021-07/24/2021  Dr Gery Pray   History of radiation therapy    Right Pelvis(Hip) 11/10/21-11/21/21- Dr. Gery Pray   History of  radiation therapy    Thoracic Spine 07/01/22-07/14/22- Dr. Gery Pray   Metastatic cancer to spine [facility]) 10/19/2020   Past Surgical History:  Procedure Laterality Date   APPENDECTOMY     CESAREAN SECTION     x 4   CHOLECYSTECTOMY       A IV Location/Drains/Wounds Patient Lines/Drains/Airways Status     Active Line/Drains/Airways     Name Placement date Placement time Site Days   Peripheral IV 01/03/23 22 G Posterior;Right Hand 01/03/23  1546  Hand  less than 1            Intake/Output Last 24 hours  Intake/Output Summary (Last 24 hours) at 01/03/2023 1852 Last data filed at 01/03/2023 1757 Gross per 24 hour  Intake 1000 ml  Output --  Net 1000 ml    Labs/Imaging Results for orders placed or performed during the hospital encounter of 01/03/23 (from the past 48 hour(s))  Comprehensive metabolic panel     Status: Abnormal   Collection Time: 01/03/23  3:50 PM  Result Value Ref Range   Sodium 129 (L) 135 - 145 mmol/L   Potassium 3.9 3.5 - 5.1 mmol/L   Chloride 95 (L) 98 - 111 mmol/L   CO2 21 (L) 22 - 32 mmol/L   Glucose, Bld 391 (H) 70 - 99 mg/dL    Comment: Glucose reference range applies only to samples taken after fasting for at least 8 hours.   BUN 17 6 - 20 mg/dL   Creatinine, Ser 0.71 0.44 - 1.00 mg/dL   Calcium 8.6 (L) 8.9 -  10.3 mg/dL   Total Protein 6.6 6.5 - 8.1 g/dL   Albumin 2.7 (L) 3.5 - 5.0 g/dL   AST 166 (H) 15 - 41 U/L   ALT 45 (H) 0 - 44 U/L   Alkaline Phosphatase 249 (H) 38 - 126 U/L   Total Bilirubin 1.0 0.3 - 1.2 mg/dL   GFR, Estimated >60 >60 mL/min    Comment: (NOTE) Calculated using the CKD-EPI Creatinine Equation (2021)    Anion gap 13 5 - 15    Comment: Performed at [facility] [address] [place], [place] [address]  CBC with Differential     Status: Abnormal   Collection Time: 01/03/23  3:50 PM  Result Value Ref Range   WBC 14.8 (H) 4.0 - 10.5 K/uL   RBC 3.98 3.87 - 5.11 MIL/uL   Hemoglobin 10.7 (L) 12.0 - 15.0 g/dL    HCT 34.3 (L) 36.0 - 46.0 %   MCV 86.2 80.0 - 100.0 fL   MCH 26.9 26.0 - 34.0 pg   MCHC 31.2 30.0 - 36.0 g/dL   RDW 17.4 (H) 11.5 - 15.5 %   Platelets 251 150 - 400 K/uL   nRBC 1.2 (H) 0.0 - 0.2 %   Neutrophils Relative % 80 %   Neutro Abs 11.8 (H) 1.7 - 7.7 K/uL   Lymphocytes Relative 2 %   Lymphs Abs 0.4 (L) 0.7 - 4.0 K/uL   Monocytes Relative 8 %   Monocytes Absolute 1.2 (H) 0.1 - 1.0 K/uL   Eosinophils Relative 0 %   Eosinophils Absolute 0.0 0.0 - 0.5 K/uL   Basophils Relative 1 %   Basophils Absolute 0.1 0.0 - 0.1 K/uL   WBC Morphology MILD LEFT SHIFT (1-5% METAS, OCC MYELO, OCC BANDS)    Immature Granulocytes 9 %   Abs Immature Granulocytes 1.30 (H) 0.00 - 0.07 K/uL    Comment: Performed at [facility], [place] [address], [place], [place] [address]   No results found.  Pending Labs Unresulted Labs (From admission, onward)     Start     Ordered   01/10/23 0500  Creatinine, serum  (enoxaparin (LOVENOX)    CrCl >/= 30 ml/min)  Weekly,   R     Comments: while on enoxaparin therapy    01/03/23 1825   01/04/23 0500  Comprehensive metabolic panel  Tomorrow morning,   R        01/03/23 1825   01/03/23 1824  CBC  (enoxaparin (LOVENOX)    CrCl >/= 30 ml/min)  Once,   R       Comments: Baseline for enoxaparin therapy IF NOT ALREADY DRAWN.  Notify MD if PLT < 100 K.    01/03/23 1825   01/03/23 1824  Creatinine, serum  (enoxaparin (LOVENOX)    CrCl >/= 30 ml/min)  Once,   R       Comments: Baseline for enoxaparin therapy IF NOT ALREADY DRAWN.    01/03/23 1825            Vitals/Pain Today's Vitals   01/03/23 1455 01/03/23 1600 01/03/23 1700 01/03/23 1800  BP:  (!) 165/83 (!) 162/89 (!) 150/85  Pulse:  100 (!) 107 (!) 110  Resp:    20  Temp:      SpO2:  98% 97% 97%  Weight: 72.1 kg     Height: 5\' 8"  (1.727 m)     PainSc:   6      Isolation Precautions No active  isolations  Medications Medications  alpelisib 300 mg daily dose (2 X 150 mg oral tablets)  (PIQRAY) tablet 300 mg (has no administration in time range)  enoxaparin (LOVENOX) injection 40 mg (has no administration in time range)  acetaminophen (TYLENOL) tablet 650 mg (has no administration in time range)    Or  acetaminophen (TYLENOL) suppository 650 mg (has no administration in time range)  oxyCODONE (Oxy IR/ROXICODONE) immediate release tablet 5 mg (has no administration in time range)  morphine (PF) 2 MG/ML injection 2 mg (has no administration in time range)  senna (SENOKOT) tablet 8.6 mg (has no administration in time range)  polyethylene glycol (MIRALAX / GLYCOLAX) packet 17 g (has no administration in time range)  ondansetron (ZOFRAN) tablet 4 mg (has no administration in time range)    Or  ondansetron (ZOFRAN) injection 4 mg (has no administration in time range)  insulin aspart (novoLOG) injection 0-24 Units (has no administration in time range)  HYDROmorphone (DILAUDID) injection 1 mg (1 mg Intravenous Given 01/03/23 1552)  sodium chloride 0.9 % bolus 1,000 mL (0 mLs Intravenous Stopped 01/03/23 1757)  ondansetron (ZOFRAN) injection 4 mg (4 mg Intravenous Given 01/03/23 1552)  HYDROmorphone (DILAUDID) injection 1 mg (1 mg Intravenous Given 01/03/23 1626)  sodium chloride 0.9 % bolus 1,000 mL (1,000 mLs Intravenous New Bag/Given 01/03/23 1815)    Mobility non-ambulatory     Focused Assessments Failure to thrive/ pain   R Recommendations: See Admitting Provider Note  Report given to:   Additional Notes: Pt must take her own medication of [place] (from home) d/t it not being stocked in hospital. Pt and family is aware and will bring in the AM 01/04/2023.

## 2023-01-03 NOTE — ED Triage Notes (Signed)
Pain in neck, shoulders, knees, hips for a few weeks-- with nausea/vomiting/weakness/--   Hx-- Cancer with bone mets-- has an appt with Dr. Marin Olp on Tuesday (Oncology) receiving chemo once a month Took Zofran Friday--

## 2023-01-03 NOTE — ED Provider Notes (Signed)
[facility] Provider Note   CSN: [id number] Arrival date & time: 01/03/23  1428     History  Chief Complaint  Patient presents with   Emesis   knee/back pain    Annette James is a 62 y.o. female.   Emesis Patient presents with pain.  In her joints and her upper back and shoulders.  History of chronic pain due to metastatic breast cancer to the bone.  Has seen her oncologist and was given a Medrol Dosepak.  Continued pain uncontrolled.  Is on fentanyl patch and oxycodone as an outpatient.  Now developed some nausea and vomiting.  Thinks the vomiting is secondary to the pain.  No diarrhea.  No real abdominal pain.  No numbness or weakness.    Past Medical History:  Diagnosis Date   Breast cancer metastasized to bone, unspecified laterality ([place]) 11/01/2020   radiation txt to spine 11/13/2020-11/29/2020  Dr Sondra Come   Chicken pox    Class 1 obesity 12/16/2020   Colitis 12/2020   Goals of care, counseling/discussion 10/19/2020   History of radiation therapy 11/13/2020-11/29/2020   left hip   Dr Gery Pray   History of radiation therapy    right hip 07/07/2021-07/24/2021  Dr Gery Pray   History of radiation therapy    Right Pelvis(Hip) 11/10/21-11/21/21- Dr. Gery Pray   History of radiation therapy    Thoracic Spine 07/01/22-07/14/22- Dr. Gery Pray   Metastatic cancer to spine [facility]) 10/19/2020    Home Medications Prior to Admission medications   Medication Sig Start Date End Date Taking? Authorizing Provider  ascorbic acid (VITAMIN C) 500 MG tablet Take 500 mg by mouth daily.    [provider]  cyclobenzaprine (FLEXERIL) 5 MG tablet TAKE 1 TABLET BY MOUTH THREE TIMES A DAY AS NEEDED FOR MUSCLE SPASM 11/23/22   Volanda Napoleon, MD  fentaNYL (DURAGESIC) 50 MCG/HR Place 1 patch onto the skin every other day. 12/08/22 06/06/23  Volanda Napoleon, MD  glimepiride (AMARYL) 2 MG tablet Take 1 tablet (2 mg total) by mouth  daily with breakfast. 10/14/22   Volanda Napoleon, MD  methylPREDNISolone (MEDROL) 4 MG TBPK tablet Take as directed 12/30/22   Volanda Napoleon, MD  Multiple Vitamins-Minerals (ONE-A-DAY WOMENS 50 PLUS) TABS Take 1 tablet by mouth daily.    [provider]  ondansetron (ZOFRAN) 8 MG tablet Take 1 tablet (8 mg total) by mouth every 8 (eight) hours as needed for nausea or vomiting. 12/18/21   Volanda Napoleon, MD  oxyCODONE (OXY IR/ROXICODONE) 5 MG immediate release tablet Take 1-2 tablets (5-10 mg total) by mouth every 4 (four) hours as needed. 12/28/22 06/26/23  Volanda Napoleon, MD  PIQRAY, 300 MG DAILY DOSE, tablet TAKE 2 TABLETS (300 MG) DAILY WITH FOOD 12/15/22   Volanda Napoleon, MD  prochlorperazine (COMPAZINE) 10 MG tablet Take 1 tablet (10 mg total) by mouth every 6 (six) hours as needed for nausea or vomiting. 07/20/22   Maryanna Shape, NP      Allergies    Patient has no known allergies.    Review of Systems   Review of Systems  Gastrointestinal:  Positive for vomiting.    Physical Exam Updated Vital Signs BP (!) 156/90   Pulse (!) 120   Temp 98.3 F (36.8 C)   Resp (!) 22   Ht 5\' 8"  (1.727 m)   Wt 72.1 kg   SpO2 97%   BMI 24.18  kg/m  Physical Exam Vitals and nursing note reviewed.  HENT:     Head: Normocephalic.  Eyes:     Extraocular Movements: Extraocular movements intact.  Cardiovascular:     Rate and Rhythm: Tachycardia present.  Abdominal:     Tenderness: There is no abdominal tenderness.  Musculoskeletal:     Cervical back: Neck supple.     Comments: Some tenderness over proximal humerus is bilaterally.  Good range of motion.  Mild lower cervical upper thoracic spine tenderness.  Skin:    General: Skin is warm.  Neurological:     Mental Status: She is alert and oriented to person, place, and time.     ED Results / Procedures / Treatments   Labs (all labs ordered are listed, but only abnormal results are displayed) Labs Reviewed  COMPREHENSIVE  METABOLIC PANEL - Abnormal; Notable for the following components:      Result Value   Sodium 129 (*)    Chloride 95 (*)    CO2 21 (*)    Glucose, Bld 391 (*)    Calcium 8.6 (*)    Albumin 2.7 (*)    AST 166 (*)    ALT 45 (*)    Alkaline Phosphatase 249 (*)    All other components within normal limits  CBC WITH DIFFERENTIAL/PLATELET - Abnormal; Notable for the following components:   WBC 14.8 (*)    Hemoglobin 10.7 (*)    HCT 34.3 (*)    RDW 17.4 (*)    nRBC 1.2 (*)    Neutro Abs 11.8 (*)    Lymphs Abs 0.4 (*)    Monocytes Absolute 1.2 (*)    Abs Immature Granulocytes 1.30 (*)    All other components within normal limits    EKG EKG Interpretation  Date/Time:  Sunday January 03 2023 14:51:14 EDT Ventricular Rate:  123 PR Interval:  130 QRS Duration: 70 QT Interval:  300 QTC Calculation: 429 R Axis:   78 Text Interpretation: Sinus tachycardia Anteroseptal infarct , age undetermined Abnormal ECG When compared with ECG of 16-Dec-2020 19:02, rate increased Confirmed by Davonna Belling [phone]) on 01/03/2023 3:19:51 PM  Radiology No results found.  Procedures Procedures    Medications Ordered in ED Medications  HYDROmorphone (DILAUDID) injection 1 mg (1 mg Intravenous Given 01/03/23 1552)  sodium chloride 0.9 % bolus 1,000 mL (1,000 mLs Intravenous New Bag/Given 01/03/23 1551)  ondansetron (ZOFRAN) injection 4 mg (4 mg Intravenous Given 01/03/23 1552)  HYDROmorphone (DILAUDID) injection 1 mg (1 mg Intravenous Given 01/03/23 1626)    ED Course/ Medical Decision Making/ A&P                             Medical Decision Making Amount and/or Complexity of Data Reviewed Labs: ordered.  Risk Prescription drug management.   Patient with nausea vomiting and pain.  Pain likely secondary to cancer.  Has known metastatic disease.  Reviewed recent PET scan from about 3 months ago.  Has a hyperglycemia.  Is on steroids but not in DKA.  Initial tachycardia improved with fluids and  some pain medicine.  However still some nausea not controlled with oral medicines.  I think she would benefit from mission to the hospital for pain control and further evaluation of the worsening of her pain.  Doubt severe me  CRITICAL CARE Performed by: Davonna Belling Total critical care time: 30 minutes Critical care time was exclusive of separately billable procedures and treating  other patients. Critical care was necessary to treat or prevent imminent or life-threatening deterioration. Critical care was time spent personally by me on the following activities: development of treatment plan with patient and/or surrogate as well as nursing, discussions with consultants, evaluation of patient's response to treatment, examination of patient, obtaining history from patient or surrogate, ordering and performing treatments and interventions, ordering and review of laboratory studies, ordering and review of radiographic studies, pulse oximetry and re-evaluation of patient's condition. tastatic disease at this time that would need urgent intervention.        Final Clinical Impression(s) / ED Diagnoses Final diagnoses:  Cancer associated pain  Hyperglycemia    Rx / DC Orders ED Discharge Orders     None         Davonna Belling, MD 01/03/23 1750

## 2023-01-04 DIAGNOSIS — C50919 Malignant neoplasm of unspecified site of unspecified female breast: Secondary | ICD-10-CM

## 2023-01-04 DIAGNOSIS — C7951 Secondary malignant neoplasm of bone: Secondary | ICD-10-CM | POA: Diagnosis not present

## 2023-01-04 DIAGNOSIS — R978 Other abnormal tumor markers: Secondary | ICD-10-CM

## 2023-01-04 DIAGNOSIS — Z515 Encounter for palliative care: Secondary | ICD-10-CM | POA: Diagnosis not present

## 2023-01-04 DIAGNOSIS — G893 Neoplasm related pain (acute) (chronic): Secondary | ICD-10-CM | POA: Diagnosis not present

## 2023-01-04 LAB — COMPREHENSIVE METABOLIC PANEL
ALT: 35 U/L (ref 0–44)
AST: 141 U/L — ABNORMAL HIGH (ref 15–41)
Albumin: 2.3 g/dL — ABNORMAL LOW (ref 3.5–5.0)
Alkaline Phosphatase: 217 U/L — ABNORMAL HIGH (ref 38–126)
Anion gap: 10 (ref 5–15)
BUN: 14 mg/dL (ref 6–20)
CO2: 23 mmol/L (ref 22–32)
Calcium: 7.9 mg/dL — ABNORMAL LOW (ref 8.9–10.3)
Chloride: 99 mmol/L (ref 98–111)
Creatinine, Ser: 0.66 mg/dL (ref 0.44–1.00)
GFR, Estimated: >60 mL/min (ref >60)
Glucose, Bld: 240 mg/dL — ABNORMAL HIGH (ref 70–99)
Potassium: 3.5 mmol/L (ref 3.5–5.1)
Sodium: 132 mmol/L — ABNORMAL LOW (ref 135–145)
Total Bilirubin: 0.9 mg/dL (ref 0.3–1.2)
Total Protein: 5.7 g/dL — ABNORMAL LOW (ref 6.5–8.1)

## 2023-01-04 LAB — GLUCOSE, CAPILLARY
Glucose-Capillary: 203 mg/dL — ABNORMAL HIGH (ref 70–99)
Glucose-Capillary: 179 mg/dL — ABNORMAL HIGH (ref 70–99)
Glucose-Capillary: 184 mg/dL — ABNORMAL HIGH (ref 70–99)
Glucose-Capillary: 178 mg/dL — ABNORMAL HIGH (ref 70–99)
Glucose-Capillary: 326 mg/dL — ABNORMAL HIGH (ref 70–99)

## 2023-01-04 MED ORDER — PROCHLORPERAZINE EDISYLATE 10 MG/2ML IJ SOLN
5.0000 mg | Freq: Four times a day (QID) | INTRAMUSCULAR | Status: DC | PRN
  Administered 2023-01-06: 5 mg via INTRAVENOUS
  Filled 2023-01-04: qty 2

## 2023-01-04 MED ORDER — OXYCODONE HCL 5 MG PO TABS
10.0000 mg | ORAL_TABLET | ORAL | Status: AC
  Administered 2023-01-04: 10 mg via ORAL
  Filled 2023-01-04: qty 2

## 2023-01-04 MED ORDER — KETOROLAC TROMETHAMINE 30 MG/ML IJ SOLN
30.0000 mg | Freq: Three times a day (TID) | INTRAMUSCULAR | Status: AC
  Administered 2023-01-04 – 2023-01-06 (×5): 30 mg via INTRAVENOUS
  Filled 2023-01-04 (×5): qty 1

## 2023-01-04 MED ORDER — INSULIN ASPART 100 UNIT/ML IJ SOLN
0.0000 [IU] | Freq: Three times a day (TID) | INTRAMUSCULAR | Status: DC
  Administered 2023-01-04: 3 [IU] via SUBCUTANEOUS
  Administered 2023-01-05 (×2): 15 [IU] via SUBCUTANEOUS

## 2023-01-04 MED ORDER — POLYETHYLENE GLYCOL 3350 17 G PO PACK
34.0000 g | PACK | Freq: Every day | ORAL | Status: DC
  Administered 2023-01-05 – 2023-01-07 (×2): 34 g via ORAL
  Filled 2023-01-04 (×3): qty 2

## 2023-01-04 MED ORDER — FENTANYL 50 MCG/HR TD PT72
1.0000 | MEDICATED_PATCH | TRANSDERMAL | Status: DC
  Administered 2023-01-05 – 2023-01-08 (×2): 1 via TRANSDERMAL
  Filled 2023-01-04 (×2): qty 1

## 2023-01-04 MED ORDER — ONDANSETRON HCL 4 MG/2ML IJ SOLN
4.0000 mg | Freq: Four times a day (QID) | INTRAMUSCULAR | Status: DC
  Administered 2023-01-04 – 2023-01-05 (×3): 4 mg via INTRAVENOUS
  Filled 2023-01-04 (×3): qty 2

## 2023-01-04 MED ORDER — OXYCODONE HCL 5 MG PO TABS
15.0000 mg | ORAL_TABLET | ORAL | Status: DC | PRN
  Administered 2023-01-04: 15 mg via ORAL
  Filled 2023-01-04: qty 3

## 2023-01-04 MED ORDER — DEXAMETHASONE SODIUM PHOSPHATE 10 MG/ML IJ SOLN
8.0000 mg | Freq: Once | INTRAMUSCULAR | Status: AC
  Administered 2023-01-04: 8 mg via INTRAVENOUS
  Filled 2023-01-04: qty 0.8

## 2023-01-04 NOTE — Consult Note (Signed)
Consultation Note Date: 01/04/2023   Patient Name: Annette James  DOB: [birth year]  MRN: [id number]  Age / Sex: 62 y.o., female  PCP: [name], MD Referring Physician: Gwynne Edinger, MD  Reason for Consultation: Pain control  HPI/Patient Profile: 62 y.o. 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 Marin Olp; s/p radiation with Dr. Sondra Come) admitted on 01/03/2023 with uncontrolled pain related to cancer. CT chest , abd/pelvis with concerns for diffuse skeletal mets with new lesions in sternum, new pleural effusion, and new liver mets.   Clinical Assessment and Goals of Care: Consult received and chart review completed. I met today with Annette James and her sister at bedside. Annette James is lying in bed on her side. She is visibly uncomfortable. Pain level 7/10 at this time with 3-4/10 tolerable. Pain begins throbbing in bilateral shoulders going down her sides into thighs and legs. She has had limited help from steroid pack, tylenol, Aleve at home. She has long standing fentanyl patch 50 mcg/hr and Oxy 5-10 mg PRN with limited relief. Pain began to worsen ~2 weeks ago.   We discussed pain plan moving forward with higher dose IV steroid trial, increased OxyIR, PRN IV morphine for now, and consider increasing fentanyl patch in the morning. We discussed plan for up titration of fentanyl + oxycodone but if this continues to provide limited relief we can consider opioid rotation (perhaps even methadone could be good for Annette James). May consider earlier dose of Zometa. May consider gabapentin and/or muscle relaxer adjunctives. Will avoid Tylenol with increased LFTs and avoid NSAID with current steroid use. Last bowel movement 01/03/23 - continue senna and miralax. I have also scheduled Zofran with PRN compazine for breakthrough nausea at [facility] request. Hopeful for improvement in  nausea when pain better controlled.   Annette James is one of 5 children and moved from [place] in 2021 to be closer with her sister. She has 4 daughters of her own who all live locally as well and she enjoys time with her grandchildren. Kalon has maintained very good functional status and quality of life until now with increased pain.   I will follow up tomorrow. All questions/concerns addressed. Emotional support provided. Updated Dr. Si Raider.   Primary Decision Maker PATIENT    SUMMARY OF RECOMMENDATIONS   - Hopeful for improved pain  Code Status/Advance Care Planning: Full code   Symptom Management:  Pain: Fentanyl 50 mcg/hr patch (plan to likely increase dose 3/26). Increased OxyIR 15 mg q4h PRN.  Morphine 2 mg IV q2h PRN.  Decadron IV 8 mg x 1.  Nausea: Zofran 4 mg IV q6h scheduled. QTc 0.429.  Compazine 5 mg IV q6h PRN breakthrough nausea.  Bowel Regimen: Senna 1 tab BID. Miralax daily.   Palliative Prophylaxis:  Bowel Regimen, Delirium Protocol, and Frequent Pain Assessment  Prognosis:  Overall long term prognosis poor with advanced cancer.   Discharge Planning: To Be Determined      Primary Diagnoses: Present on Admission:  Cancer-related breakthrough  pain  Breast cancer metastasized to bone, unspecified laterality ([place])  Class 1 obesity   I have reviewed the medical record, interviewed the patient and family, and examined the patient. The following aspects are pertinent.  Past Medical History:  Diagnosis Date   Breast cancer metastasized to bone, unspecified laterality ([place]) 11/01/2020   radiation txt to spine 11/13/2020-11/29/2020  Dr Sondra Come   Chicken pox    Class 1 obesity 12/16/2020   Colitis 12/2020   Goals of care, counseling/discussion 10/19/2020   History of radiation therapy 11/13/2020-11/29/2020   left hip   Dr Gery Pray   History of radiation therapy    right hip 07/07/2021-07/24/2021  Dr Gery Pray   History of radiation therapy    Right Pelvis(Hip)  11/10/21-11/21/21- Dr. Gery Pray   History of radiation therapy    Thoracic Spine 07/01/22-07/14/22- Dr. Gery Pray   Metastatic cancer to spine [facility]) 10/19/2020   Social History   Socioeconomic History   Marital status: Widowed    Spouse name: Not on file   Number of children: Not on file   Years of education: Not on file   Highest education level: Not on file  Occupational History   Not on file  Tobacco Use   Smoking status: Never   Smokeless tobacco: Never  Vaping Use   Vaping Use: Never used  Substance and Sexual Activity   Alcohol use: Not Currently   Drug use: Not Currently   Sexual activity: Not Currently  Other Topics Concern   Not on file  Social History Narrative   Not on file   Social Determinants of Health   Financial Resource Strain: Not on file  Food Insecurity: Not on file  Transportation Needs: Not on file  Physical Activity: Not on file  Stress: Not on file  Social Connections: Not on file   Family History  Problem Relation Age of Onset   Breast cancer Mother    Hypertension Mother    Colon cancer Father    Hypertension Father    Alcoholism Brother    Alcoholism Brother    Scheduled Meds:  alpelisib 300 mg daily dose (2 X 150 mg oral tablets)  300 mg Oral Daily   enoxaparin (LOVENOX) injection  40 mg Subcutaneous Q24H   [START ON 01/05/2023] fentaNYL  1 patch Transdermal Q72H   insulin aspart  0-15 Units Subcutaneous TID WC   [START ON 01/05/2023] polyethylene glycol  34 g Oral Daily   senna  1 tablet Oral BID   Continuous Infusions: PRN Meds:.acetaminophen **OR** acetaminophen, morphine injection, ondansetron **OR** ondansetron (ZOFRAN) IV, oxyCODONE No Known Allergies Review of Systems  Constitutional:  Positive for appetite change.  Gastrointestinal:  Positive for nausea. Negative for constipation and diarrhea.  Musculoskeletal:        Pain in bilateral shoulders/arms down into legs    Physical Exam Vitals and nursing note  reviewed.  Constitutional:      General: She is not in acute distress.    Appearance: She is ill-appearing.  Cardiovascular:     Rate and Rhythm: Normal rate.  Pulmonary:     Effort: No tachypnea, accessory muscle usage or respiratory distress.  Abdominal:     General: Abdomen is flat.  Neurological:     Mental Status: She is alert and oriented to person, place, and time.     Vital Signs: BP (!) 147/71 (BP Location: Left Arm)   Pulse 80   Temp 99.1 F (37.3 C) (Oral)   Resp  18   Ht 5\' 8"  (1.727 m)   Wt 72.1 kg   SpO2 97%   BMI 24.18 kg/m  Pain Scale: 0-10   Pain Score: 3    SpO2: SpO2: 97 % O2 Device:SpO2: 97 % O2 Flow Rate: .   IO: Intake/output summary:  Intake/Output Summary (Last 24 hours) at 01/04/2023 1456 Last data filed at 01/03/2023 1757 Gross per 24 hour  Intake 1000 ml  Output --  Net 1000 ml    LBM: Last BM Date : 01/03/23 Baseline Weight: Weight: 72.1 kg Most recent weight: Weight: 72.1 kg     Palliative Assessment/Data:     Time In: 1455  Time Total: 60 min  Greater than 50%  of this time was spent counseling and coordinating care related to the above assessment and plan.  Signed by: Vinie Sill, NP Palliative Medicine Team Pager # [phone] (M-F 8a-5p) Team Phone # [phone] (Nights/Weekends)

## 2023-01-04 NOTE — Consult Note (Addendum)
Referral MD  Reason for Referral: Metastatic breast cancer-worsening pain  Chief Complaint  Patient presents with   Emesis   knee/back pain  : I just could not take the joint aches and pains.  HPI: Annette James is a very charming 62 year old white female.  She is postmenopausal.  We first saw her back in December 2022 I think.  At that time, she presented with metastatic breast cancer.  We had to have treated her with antiestrogen therapy.  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  She has been getting Zometa.  She is due for Zometa in September.  Initially had her on Faslodex and Ibrance.  She has had radiation therapy before.  She has had radiation to her right hip and to T12.  She actually called the office recently.  She began to have some arthralgias.  This was in her joints.  This mostly in her arms and shoulders and neck.  We can put her on some prednisone.  This helped transiently.  She subsequently was admitted on 01/03/2023 with a significant pain that she cannot tolerate.  She had a hard time getting around because of the pain.  Her labs show a sodium 132.  Potassium 3.5.  BUN 14 creatinine 0.66.  Blood sugar is 240 which is likely from the [place].  Her calcium 7.9 with an albumin of 2.3.  SGPT 35 SGOT 141.  Her blood counts show a white cell count 14.6.  Hemoglobin 9.6.  Platelet count 225.  She did have problems with anemia.  So far would not be able to uncover the source of the anemia.  I suspect that she may have marrow involvement by her blood smear.  Her tumor marker-CA 27.20 is also going up.  We last saw her back in late February the CA 27.29 was 142.  She did have a CT scan of the body when she came in.  This was done on 01/03/2023.  Unfortunate, this did show that she had progressive disease.  She had diffuse skeletal mets which we knew she had before.  There appeared to be a new soft tissue lesion anterior to the sternum.  There is  also small Loeffler effusion.  She had low-attenuation lesions in the liver.  She had an ovarian cyst on the left.  Clinically, I suspect that she probably is progressing.  I will be told that there might be a liver lesions.  She was seen by Palliative Care.  Lucy [place] recommendations for [place] for her pain.  She is still in great health.  She still works.  She travels for her job.  Her daughter was with this.  She has had no problems going to the bathroom.  There is been no issues with cough or increased shortness of breath.  She has had no nausea or vomiting.  I think she is eating fairly well.  She has had no rashes.  There is been no bleeding.  She has had no fever.  I think COVID has not been a problem for her.  Overall, I would say performance status is probably ECOG 1.  Past Medical History:  Diagnosis Date   Breast cancer metastasized to bone, unspecified laterality ([place]) 11/01/2020   radiation txt to spine 11/13/2020-11/29/2020  Dr Sondra Come   Chicken pox    Class 1 obesity 12/16/2020   Colitis 12/2020   Goals of care, counseling/discussion 10/19/2020   History of radiation therapy 11/13/2020-11/29/2020  left hip   Dr Gery Pray   History of radiation therapy    right hip 07/07/2021-07/24/2021  Dr Gery Pray   History of radiation therapy    Right Pelvis(Hip) 11/10/21-11/21/21- Dr. Gery Pray   History of radiation therapy    Thoracic Spine 07/01/22-07/14/22- Dr. Gery Pray   Metastatic cancer to spine [facility]) 10/19/2020  :   Past Surgical History:  Procedure Laterality Date   APPENDECTOMY     CESAREAN SECTION     x 4   CHOLECYSTECTOMY    :   Current Facility-Administered Medications:    acetaminophen (TYLENOL) tablet 650 mg, 650 mg, Oral, Q6H PRN, 650 mg at 01/03/23 2039 **OR** acetaminophen (TYLENOL) suppository 650 mg, 650 mg, Rectal, Q6H PRN, Emilee Hero, MD   alpelisib 300 mg daily dose (2 X 150 mg oral tablets) (PIQRAY) tablet 300 mg, 300 mg, Oral,  Daily, Dorrell, Robert, MD   enoxaparin (LOVENOX) injection 40 mg, 40 mg, Subcutaneous, Q24H, Dorrell, Robert, MD, 40 mg at 01/03/23 2238   [START ON 01/05/2023] fentaNYL (DURAGESIC) 50 MCG/HR 1 patch, 1 patch, Transdermal, Q72H, Wouk, Ailene Rud, MD   insulin aspart (novoLOG) injection 0-15 Units, 0-15 Units, Subcutaneous, TID WC, Wouk, Ailene Rud, MD   morphine (PF) 2 MG/ML injection 2 mg, 2 mg, Intravenous, Q2H PRN, Emilee Hero, MD, 2 mg at 01/04/23 1612   ondansetron (ZOFRAN) injection 4 mg, 4 mg, Intravenous, [id number], Pershing Proud, NP   oxyCODONE (Oxy IR/ROXICODONE) immediate release tablet 15 mg, 15 mg, Oral, [id number] PRN, Pershing Proud, NP   [START ON 01/05/2023] polyethylene glycol (MIRALAX / GLYCOLAX) packet 34 g, 34 g, Oral, Daily, Wouk, Ailene Rud, MD   prochlorperazine (COMPAZINE) injection 5 mg, 5 mg, Intravenous, [id number] PRN, Pershing Proud, NP   senna (SENOKOT) tablet 8.6 mg, 1 tablet, Oral, BID, Dorrell, Robert, MD, 8.6 mg at 01/04/23 0901:   alpelisib 300 mg daily dose (2 X 150 mg oral tablets)  300 mg Oral Daily   enoxaparin (LOVENOX) injection  40 mg Subcutaneous Q24H   [START ON 01/05/2023] fentaNYL  1 patch Transdermal Q72H   insulin aspart  0-15 Units Subcutaneous TID WC   ondansetron (ZOFRAN) IV  4 mg Intravenous Q6H   [START ON 01/05/2023] polyethylene glycol  34 g Oral Daily   senna  1 tablet Oral BID  :  No Known Allergies:   Family History  Problem Relation Age of Onset   Breast cancer Mother    Hypertension Mother    Colon cancer Father    Hypertension Father    Alcoholism Brother    Alcoholism Brother   :   Social History   Socioeconomic History   Marital status: Widowed    Spouse name: Not on file   Number of children: Not on file   Years of education: Not on file   Highest education level: Not on file  Occupational History   Not on file  Tobacco Use   Smoking status: Never   Smokeless tobacco: Never  Vaping Use   Vaping Use: Never used   Substance and Sexual Activity   Alcohol use: Not Currently   Drug use: Not Currently   Sexual activity: Not Currently  Other Topics Concern   Not on file  Social History Narrative   Not on file   Social Determinants of Health   Financial Resource Strain: Not on file  Food Insecurity: Not on file  Transportation Needs: Not on file  Physical Activity: Not on  file  Stress: Not on file  Social Connections: Not on file  Intimate Partner Violence: Not At Risk (06/23/2021)   Humiliation, Afraid, Rape, and Kick questionnaire    Fear of Current or Ex-Partner: No    Emotionally Abused: No    Physically Abused: No    Sexually Abused: No  :  Review of Systems  Constitutional: Negative.   HENT: Negative.    Eyes: Negative.   Respiratory: Negative.    Cardiovascular: Negative.   Gastrointestinal: Negative.   Genitourinary: Negative.   Musculoskeletal:  Positive for joint pain, myalgias and neck pain.  Skin: Negative.   Neurological: Negative.   Endo/Heme/Allergies: Negative.   Psychiatric/Behavioral: Negative.       Exam: Patient Vitals for the past 24 hrs:  BP Temp Temp src Pulse Resp SpO2  01/04/23 1557 (!) 160/67 99.1 F (37.3 C) Oral (!) 103 16 98 %  01/04/23 0827 (!) 147/71 99.1 F (37.3 C) Oral 80 18 97 %  01/04/23 0404 133/72 98.5 F (36.9 C) Oral 98 -- 98 %  01/03/23 2354 (!) 139/90 98.2 F (36.8 C) Oral (!) 101 -- 96 %  01/03/23 2244 (!) 146/78 98 F (36.7 C) Oral (!) 106 -- 95 %  01/03/23 2238 (!) 146/78 98 F (36.7 C) -- (!) 106 18 --  01/03/23 2028 (!) 158/86 (!) 102 F (38.9 C) Oral (!) 104 -- 98 %   Physical Exam Vitals reviewed.  HENT:     Head: Normocephalic and atraumatic.  Eyes:     Pupils: Pupils are equal, round, and reactive to light.  Cardiovascular:     Rate and Rhythm: Normal rate and regular rhythm.     Heart sounds: Normal heart sounds.  Pulmonary:     Effort: Pulmonary effort is normal.     Breath sounds: Normal breath sounds.   Abdominal:     General: Bowel sounds are normal.     Palpations: Abdomen is soft.  Musculoskeletal:        General: No tenderness or deformity. Normal range of motion.     Cervical back: Normal range of motion.  Lymphadenopathy:     Cervical: No cervical adenopathy.  Skin:    General: Skin is warm and dry.     Findings: No erythema or rash.  Neurological:     Mental Status: She is alert and oriented to person, place, and time.  Psychiatric:        Behavior: Behavior normal.        Thought Content: Thought content normal.        Judgment: Judgment normal.     Recent Labs    01/03/23 1550 01/03/23 2022  WBC 14.8* 14.6*  HGB 10.7* 9.6*  HCT 34.3* 29.9*  PLT 251 225    Recent Labs    01/03/23 1550 01/03/23 2022 01/04/23 0308  NA 129*  --  132*  K 3.9  --  3.5  CL 95*  --  99  CO2 21*  --  23  GLUCOSE 391*  --  240*  BUN 17  --  14  CREATININE 0.71 0.68 0.66  CALCIUM 8.6*  --  7.9*    Blood smear review: None  Pathology: None    Assessment and Plan: Ms. Rezek is a very charming 62 year old postmenopausal female.  She has metastatic breast cancer.  She presented with metastatic breast cancer over 2 years ago.  So far, we have been able to treat her with antiestrogen therapy.  I suspect  replug and have to consider chemotherapy for her now.  We have a lot of testing that we have to do.  I think she needs an MRI of the liver to see if there is a liver metastasis.  We do need to get a biopsy.  We really need to get some fresh tissue so that we can run our molecular studies to see if we can use any type of targeted therapy.  I also suspect she needs a bone marrow biopsy.  She is anemic.  We have done testing which has been negative.  I need to make sure there is no marrow involvement by her cancer.  I do worry that she does have marrow involvement by cancer that could be causing this pain in her joints.  Again, she has gotten bisphosphonate therapy.  She is not  due for 1 until May.  I would like to get a bone scan on her.  Ultimately, it would be nice to get a PET scan but we cannot do these as an inpatient.  I had a long talk with she and her daughter.  We all agreed to my recommendations.  Ultimately, I suspect she is going need to have a Port-A-Cath.  We may need to get an echocardiogram on her.  I think the biopsy will be critical as I really need to see what her HER2 status is.  She is still in great shape so we do we have an opportunity to be aggressive with therapy.  I know that she will get incredible care from the fantastic and compassionate staff upon [address]    Annette Haw, MD  [place] 21:4-5

## 2023-01-04 NOTE — Progress Notes (Signed)
PROGRESS NOTE    Annette James  [id number] DOB: [birth year] DOA: 01/03/2023 PCP: [name], MD  Outpatient Specialists: oncology    Brief Narrative:   From admission h and p  Annette James is a 62 y.o. female with medical history significant of metastatic breast cancer who presented to the department due to uncontrolled pain.  Patient states that she has been having uncontrolled pain in her joints upper back and shoulders.  She saw her oncologist who gave her steroid pack without improvement.  She takes fentanyl and oxycodone as an outpatient.  Due to uncontrolled pain she presented to the ER.  On arrival she was found to be tachycardic in the 120s and hemodynamically stable.  Labs were obtained which revealed sodium 129, bicarb 21, glucose 391, AST 166, ALT 45, alkaline phosphatase 249, WBC 14.8, hemoglobin 10.7, CT chest abdomen pelvis was obtained which demonstrated diffuse skeletal metastasis including new lesions in the sternum, and new pleural effusion and new liver mets.  Patient was given IV analgesia with persistent pain so was admitted for pain management.  On admission she was resting comfortably.  She endorsed persistent pain in her back and requests new pain meds.  She denied any infectious complaints and she did not have any shortness of breath or chest pain   Assessment & Plan:   Principal Problem:   Cancer-related breakthrough pain Active Problems:   Breast cancer metastasized to bone, unspecified laterality (HCC)   Class 1 obesity  # Cancer-related skeletal pain CT of c/a/p shows diffuse skeletal metastasis. Patient with diffuse pain shoulders, arms, left leg. Chronic problem significantly worse past couple of weeks. Failed oral steroid.  - bowel regimen - home fentanyl - prn morphine/oxy - oncology consult, consider osteoclast inhibitor - palliative consult as well  # Metastatic breast cancer - continue piqray  # Hyperglycemia No dx of dm,  did get recent steroids - SSI, holding home glimepiride   DVT prophylaxis: lovenox Code Status: full Family Communication: none @ bedside  Level of care: Med-Surg Status is: Inpatient Remains inpatient appropriate because: severity of illness    Consultants:  Oncology, palliative  Procedures: none  Antimicrobials:  none    Subjective: Ongoing pain shoulders arms and left thigh  Objective: Vitals:   01/03/23 2244 01/03/23 2354 01/04/23 0404 01/04/23 0827  BP: (!) 146/78 (!) 139/90 133/72 (!) 147/71  Pulse: (!) 106 (!) 101 98 80  Resp:    18  Temp: 98 F (36.7 C) 98.2 F (36.8 C) 98.5 F (36.9 C) 99.1 F (37.3 C)  TempSrc: Oral Oral Oral Oral  SpO2: 95% 96% 98% 97%  Weight:      Height:        Intake/Output Summary (Last 24 hours) at 01/04/2023 1325 Last data filed at 01/03/2023 1757 Gross per 24 hour  Intake 1000 ml  Output --  Net 1000 ml   Filed Weights   01/03/23 1455  Weight: 72.1 kg    Examination:  General exam: Appears in pain Respiratory system: Clear to auscultation. Respiratory effort normal. Cardiovascular system: S1 & S2 heard, RRR. No JVD, murmurs, rubs, gallops or clicks. No pedal edema. Gastrointestinal system: Abdomen is nondistended, soft and nontender. No organomegaly or masses felt. Normal bowel sounds heard. Central nervous system: Alert and oriented. No focal neurological deficits. Extremities: Symmetric 5 x 5 power. Skin: No rashes, lesions or ulcers Psychiatry: Judgement and insight appear normal. Mood & affect appropriate.     Data Reviewed: I have  personally reviewed following labs and imaging studies  CBC: Recent Labs  Lab 01/03/23 1550 01/03/23 2022  WBC 14.8* 14.6*  NEUTROABS 11.8*  --   HGB 10.7* 9.6*  HCT 34.3* 29.9*  MCV 86.2 84.9  PLT 251 [id number]   Basic Metabolic Panel: Recent Labs  Lab 01/03/23 1550 01/03/23 2022 01/04/23 0308  NA 129*  --  132*  K 3.9  --  3.5  CL 95*  --  99  CO2 21*  --  23   GLUCOSE 391*  --  240*  BUN 17  --  14  CREATININE 0.71 0.68 0.66  CALCIUM 8.6*  --  7.9*   GFR: Estimated Creatinine Clearance: 76.4 mL/min (by C-G formula based on SCr of 0.66 mg/dL). Liver Function Tests: Recent Labs  Lab 01/03/23 1550 01/04/23 0308  AST 166* 141*  ALT 45* 35  ALKPHOS 249* 217*  BILITOT 1.0 0.9  PROT 6.6 5.7*  ALBUMIN 2.7* 2.3*   No results for input(s): "LIPASE", "AMYLASE" in the last 168 hours. No results for input(s): "AMMONIA" in the last 168 hours. Coagulation Profile: No results for input(s): "INR", "PROTIME" in the last 168 hours. Cardiac Enzymes: No results for input(s): "CKTOTAL", "CKMB", "CKMBINDEX", "TROPONINI" in the last 168 hours. BNP (last 3 results) No results for input(s): "PROBNP" in the last 8760 hours. HbA1C: No results for input(s): "HGBA1C" in the last 72 hours. CBG: Recent Labs  Lab 01/03/23 2107 01/03/23 2353 01/04/23 0402 01/04/23 0829 01/04/23 1241  GLUCAP 289* 246* 203* 179* 184*   Lipid Profile: No results for input(s): "CHOL", "HDL", "LDLCALC", "TRIG", "CHOLHDL", "LDLDIRECT" in the last 72 hours. Thyroid Function Tests: No results for input(s): "TSH", "T4TOTAL", "FREET4", "T3FREE", "THYROIDAB" in the last 72 hours. Anemia Panel: No results for input(s): "VITAMINB12", "FOLATE", "FERRITIN", "TIBC", "IRON", "RETICCTPCT" in the last 72 hours. Urine analysis:    Component Value Date/Time   COLORURINE AMBER (A) 10/18/2020 0819   APPEARANCEUR CLOUDY (A) 10/18/2020 0819   LABSPEC 1.028 10/18/2020 0819   PHURINE 5.0 10/18/2020 0819   GLUCOSEU NEGATIVE 10/18/2020 0819   HGBUR NEGATIVE 10/18/2020 0819   BILIRUBINUR NEGATIVE 10/18/2020 0819   KETONESUR 5 (A) 10/18/2020 0819   PROTEINUR 100 (A) 10/18/2020 0819   NITRITE NEGATIVE 10/18/2020 0819   LEUKOCYTESUR NEGATIVE 10/18/2020 0819   Sepsis Labs: @LABRCNTIP (procalcitonin:4,lacticidven:4)  )No results found for this or any previous visit (from the past 240 hour(s)).        Radiology Studies: CT CHEST ABDOMEN PELVIS W CONTRAST  Result Date: 01/03/2023 CLINICAL DATA:  Cancer-related pain. Currently on chemotherapy for metastatic breast cancer. EXAM: CT CHEST, ABDOMEN, AND PELVIS WITH CONTRAST TECHNIQUE: Multidetector CT imaging of the chest, abdomen and pelvis was performed following the standard protocol during bolus administration of intravenous contrast. RADIATION DOSE REDUCTION: This exam was performed according to the departmental dose-optimization program which includes automated exposure control, adjustment of the mA and/or kV according to patient size and/or use of iterative reconstruction technique. CONTRAST:  6mL OMNIPAQUE IOHEXOL 350 MG/ML SOLN COMPARISON:  PET-CT 09/11/2022. CT chest abdomen and pelvis 12/16/2020 FINDINGS: CT CHEST FINDINGS Cardiovascular: Normal heart size. No pericardial effusions. Normal caliber thoracic aorta. No aortic dissection. Great vessel origins are patent. Mediastinum/Nodes: Thyroid gland is unremarkable. Esophagus is decompressed. No significant lymphadenopathy in the chest. Lungs/Pleura: Lungs are clear. No pulmonary nodules, consolidation, or airspace disease. Small left pleural effusion. The effusion is new since the prior PET-CT. Musculoskeletal: Diffuse skeletal metastasis throughout the visualized bones with mixed lucency and sclerosis.  Old rib fractures. New since the prior study, there is soft tissue lesion with low-attenuation anterior to the mid sternum. This could represent soft tissue tumor with tumor necrosis or an abscess. This is new since the prior PET-CT. CT ABDOMEN PELVIS FINDINGS Hepatobiliary: Several vague low-attenuation lesions demonstrated in the liver. Most prominent is in the anterior segment of the right lobe of the liver, measuring 1.7 cm diameter. This lesion was not seen on prior studies and no uptake was shown on the PET-CT. This may represent developing hepatic metastasis. Gallbladder is surgically  absent. No bile duct dilatation. Pancreas: Unremarkable. No pancreatic ductal dilatation or surrounding inflammatory changes. Spleen: Normal in size without focal abnormality. Adrenals/Urinary Tract: No adrenal gland nodules. Kidneys are symmetrical with homogeneous nephrograms. No hydronephrosis or hydroureter. Bladder is unremarkable. Stomach/Bowel: Stomach, small bowel, and colon are not abnormally distended. No wall thickening or inflammatory changes are appreciated. Appendix is surgically absent. Vascular/Lymphatic: No significant vascular findings are present. No enlarged abdominal or pelvic lymph nodes. Reproductive: Uterus is not enlarged. Cyst in the left ovary measuring 4.6 x 3.5 cm. This is unchanged since prior study. Other: Small amount of free fluid in the upper abdomen. No free air. Abdominal wall musculature appears intact. Soft tissue edema in the subcutaneous fat. Musculoskeletal: Diffuse skeletal metastasis throughout the visualized bones with mixed lucent and sclerotic change. IMPRESSION: 1. Diffuse skeletal metastasis. 2. New since prior study, there is low-attenuation soft tissue lesion anterior to the sternum possibly representing soft tissue tumor or abscess. 3. New since the prior study are a small left pleural effusion and small amount of free fluid in the upper abdomen. 4. New since the prior study are vague low-attenuation lesions in the liver possibly representing developing metastasis. 5. Unchanged appearance of 4.6 cm left ovarian cyst. Due to the stability of this lesion, no additional imaging follow-up is recommended. Note: This recommendation does not apply to premenarchal patients and to those with increased risk (genetic, family history, elevated tumor markers or other high-risk factors) of ovarian cancer. Reference: JACR 2020 Feb; 17(2):248-254 Electronically Signed   By: Lucienne Capers M.D.   On: 01/03/2023 20:15        Scheduled Meds:  alpelisib 300 mg daily dose (2 X  150 mg oral tablets)  300 mg Oral Daily   enoxaparin (LOVENOX) injection  40 mg Subcutaneous Q24H   [START ON 01/05/2023] fentaNYL  1 patch Transdermal Q72H   insulin aspart  0-24 Units Subcutaneous Q4H   polyethylene glycol  17 g Oral Daily   senna  1 tablet Oral BID   Continuous Infusions:   LOS: 1 day     Desma Maxim, MD [facility]   If 7PM-7AM, please contact night-coverage [email or web address] Password [email or web address] 01/04/2023, 1:25 PM

## 2023-01-05 ENCOUNTER — Inpatient Hospital Stay (HOSPITAL_COMMUNITY): Payer: BC Managed Care – PPO

## 2023-01-05 DIAGNOSIS — Z515 Encounter for palliative care: Secondary | ICD-10-CM | POA: Diagnosis not present

## 2023-01-05 DIAGNOSIS — G893 Neoplasm related pain (acute) (chronic): Secondary | ICD-10-CM | POA: Diagnosis not present

## 2023-01-05 LAB — CBC WITH DIFFERENTIAL/PLATELET
Abs Immature Granulocytes: 0 10*3/uL (ref 0.00–0.07)
Basophils Absolute: 0 10*3/uL (ref 0.0–0.1)
Basophils Relative: 0 %
Eosinophils Absolute: 0.1 10*3/uL (ref 0.0–0.5)
Eosinophils Relative: 1 %
HCT: 30.9 % — ABNORMAL LOW (ref 36.0–46.0)
Hemoglobin: 9.9 g/dL — ABNORMAL LOW (ref 12.0–15.0)
Lymphocytes Relative: 2 %
Lymphs Abs: 0.3 10*3/uL — ABNORMAL LOW (ref 0.7–4.0)
MCH: 27.3 pg (ref 26.0–34.0)
MCHC: 32 g/dL (ref 30.0–36.0)
MCV: 85.1 fL (ref 80.0–100.0)
Monocytes Absolute: 0.4 10*3/uL (ref 0.1–1.0)
Monocytes Relative: 3 %
Neutro Abs: 12.4 10*3/uL — ABNORMAL HIGH (ref 1.7–7.7)
Neutrophils Relative %: 94 %
Platelets: 227 10*3/uL (ref 150–400)
RBC: 3.63 MIL/uL — ABNORMAL LOW (ref 3.87–5.11)
RDW: 17.5 % — ABNORMAL HIGH (ref 11.5–15.5)
WBC: 13.2 10*3/uL — ABNORMAL HIGH (ref 4.0–10.5)
nRBC: 0 /100 WBC
nRBC: 0.2 % (ref 0.0–0.2)

## 2023-01-05 LAB — COMPREHENSIVE METABOLIC PANEL
ALT: 60 U/L — ABNORMAL HIGH (ref 0–44)
AST: 77 U/L — ABNORMAL HIGH (ref 15–41)
Albumin: 2.1 g/dL — ABNORMAL LOW (ref 3.5–5.0)
Alkaline Phosphatase: 242 U/L — ABNORMAL HIGH (ref 38–126)
Anion gap: 11 (ref 5–15)
BUN: 19 mg/dL (ref 6–20)
CO2: 25 mmol/L (ref 22–32)
Calcium: 8.2 mg/dL — ABNORMAL LOW (ref 8.9–10.3)
Chloride: 96 mmol/L — ABNORMAL LOW (ref 98–111)
Creatinine, Ser: 0.78 mg/dL (ref 0.44–1.00)
GFR, Estimated: >60 mL/min (ref >60)
Glucose, Bld: 407 mg/dL — ABNORMAL HIGH (ref 70–99)
Potassium: 4 mmol/L (ref 3.5–5.1)
Sodium: 132 mmol/L — ABNORMAL LOW (ref 135–145)
Total Bilirubin: 0.8 mg/dL (ref 0.3–1.2)
Total Protein: 5.6 g/dL — ABNORMAL LOW (ref 6.5–8.1)

## 2023-01-05 LAB — GLUCOSE, CAPILLARY
Glucose-Capillary: 390 mg/dL — ABNORMAL HIGH (ref 70–99)
Glucose-Capillary: 352 mg/dL — ABNORMAL HIGH (ref 70–99)
Glucose-Capillary: 408 mg/dL — ABNORMAL HIGH (ref 70–99)
Glucose-Capillary: 410 mg/dL — ABNORMAL HIGH (ref 70–99)
Glucose-Capillary: 286 mg/dL — ABNORMAL HIGH (ref 70–99)

## 2023-01-05 LAB — IRON AND TIBC
Iron: 34 ug/dL (ref 28–170)
Saturation Ratios: 20 % (ref 10.4–31.8)
TIBC: 174 ug/dL — ABNORMAL LOW (ref 250–450)
UIBC: 140 ug/dL

## 2023-01-05 LAB — RETICULOCYTES
Immature Retic Fract: 33.7 % — ABNORMAL HIGH (ref 2.3–15.9)
RBC.: 3.65 MIL/uL — ABNORMAL LOW (ref 3.87–5.11)
Retic Count, Absolute: 72.3 10*3/uL (ref 19.0–186.0)
Retic Ct Pct: 2 % (ref 0.4–3.1)

## 2023-01-05 LAB — LACTATE DEHYDROGENASE: LDH: 441 U/L — ABNORMAL HIGH (ref 98–192)

## 2023-01-05 MED ORDER — SODIUM CHLORIDE 0.9 % IV SOLN
8.0000 mg | Freq: Three times a day (TID) | INTRAVENOUS | Status: DC | PRN
  Filled 2023-01-05: qty 4

## 2023-01-05 MED ORDER — OXYCODONE HCL 5 MG PO TABS
10.0000 mg | ORAL_TABLET | ORAL | Status: DC | PRN
  Administered 2023-01-05 – 2023-01-06 (×4): 10 mg via ORAL
  Administered 2023-01-07: 15 mg via ORAL
  Administered 2023-01-07: 10 mg via ORAL
  Administered 2023-01-08 (×3): 15 mg via ORAL
  Filled 2023-01-05 (×4): qty 2
  Filled 2023-01-05 (×2): qty 3
  Filled 2023-01-05: qty 2
  Filled 2023-01-05 (×2): qty 3

## 2023-01-05 MED ORDER — INSULIN GLARGINE-YFGN 100 UNIT/ML ~~LOC~~ SOLN
10.0000 [IU] | Freq: Once | SUBCUTANEOUS | Status: AC
  Administered 2023-01-05: 10 [IU] via SUBCUTANEOUS
  Filled 2023-01-05: qty 0.1

## 2023-01-05 MED ORDER — INSULIN ASPART 100 UNIT/ML IJ SOLN
0.0000 [IU] | Freq: Every day | INTRAMUSCULAR | Status: DC
  Administered 2023-01-05: 3 [IU] via SUBCUTANEOUS
  Administered 2023-01-06 – 2023-01-07 (×2): 4 [IU] via SUBCUTANEOUS

## 2023-01-05 MED ORDER — INSULIN ASPART 100 UNIT/ML IJ SOLN
3.0000 [IU] | Freq: Three times a day (TID) | INTRAMUSCULAR | Status: DC
  Administered 2023-01-05 – 2023-01-08 (×7): 3 [IU] via SUBCUTANEOUS

## 2023-01-05 MED ORDER — FULVESTRANT 250 MG/5ML IM SOSY
500.0000 mg | PREFILLED_SYRINGE | Freq: Once | INTRAMUSCULAR | Status: AC
  Administered 2023-01-05: 500 mg via INTRAMUSCULAR
  Filled 2023-01-05: qty 10

## 2023-01-05 MED ORDER — GADOBUTROL 1 MMOL/ML IV SOLN
7.0000 mL | Freq: Once | INTRAVENOUS | Status: AC | PRN
  Administered 2023-01-05: 7 mL via INTRAVENOUS

## 2023-01-05 MED ORDER — DEXAMETHASONE SODIUM PHOSPHATE 10 MG/ML IJ SOLN
6.0000 mg | INTRAMUSCULAR | Status: DC
  Filled 2023-01-05: qty 0.6

## 2023-01-05 MED ORDER — TECHNETIUM TC 99M MEDRONATE IV KIT
20.0000 | PACK | Freq: Once | INTRAVENOUS | Status: AC | PRN
  Administered 2023-01-05: 21.9 via INTRAVENOUS

## 2023-01-05 MED ORDER — INSULIN ASPART 100 UNIT/ML IJ SOLN
0.0000 [IU] | Freq: Three times a day (TID) | INTRAMUSCULAR | Status: DC
  Administered 2023-01-05: 15 [IU] via SUBCUTANEOUS
  Administered 2023-01-05: 20 [IU] via SUBCUTANEOUS
  Administered 2023-01-06: 7 [IU] via SUBCUTANEOUS
  Administered 2023-01-06: 11 [IU] via SUBCUTANEOUS
  Administered 2023-01-06: 7 [IU] via SUBCUTANEOUS
  Administered 2023-01-07: 3 [IU] via SUBCUTANEOUS
  Administered 2023-01-07: 7 [IU] via SUBCUTANEOUS
  Administered 2023-01-08: 4 [IU] via SUBCUTANEOUS

## 2023-01-05 MED ORDER — ONDANSETRON HCL 4 MG PO TABS
8.0000 mg | ORAL_TABLET | Freq: Three times a day (TID) | ORAL | Status: DC | PRN
  Administered 2023-01-07: 8 mg via ORAL
  Filled 2023-01-05: qty 2

## 2023-01-05 NOTE — Consult Note (Signed)
Chief Complaint: Metastatic breast cancer  Referring Physician(s): Ennever  Supervising Physician: Sandi Mariscal  Patient Status: [facility] - In-pt  History of Present Illness: Annette James is a 62 y.o. female with metastatic breast cancer.  She presented with uncontrolled pain.   CT chest abdomen pelvis was obtained which demonstrated diffuse skeletal metastasis including new lesions in the sternum, and new pleural effusion and new liver mets.   We are asked to perform a bone marrow biopsy.  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 Gery Pray   History of radiation therapy    Thoracic Spine 07/01/22-07/14/22- Dr. Gery Pray   Metastatic cancer to spine [facility]) 10/19/2020    Past Surgical History:  Procedure Laterality Date   APPENDECTOMY     CESAREAN SECTION     x 4   CHOLECYSTECTOMY      Allergies: Patient has no known allergies.  Medications: Prior to Admission medications   Medication Sig Start Date End Date Taking? Authorizing Provider  ascorbic acid (VITAMIN C) 500 MG tablet Take 500 mg by mouth daily.   Yes [provider]  cyclobenzaprine (FLEXERIL) 5 MG tablet TAKE 1 TABLET BY MOUTH THREE TIMES A DAY AS NEEDED FOR MUSCLE SPASM Patient taking differently: Take 5 mg by mouth 3 (three) times daily as needed for muscle spasms. 11/23/22  Yes Volanda Napoleon, MD  fentaNYL (DURAGESIC) 50 MCG/HR Place 1 patch onto the skin every other day. 12/08/22 06/06/23 Yes Ennever, Rudell Cobb, MD  glimepiride (AMARYL) 2 MG tablet Take 1 tablet (2 mg total) by mouth daily with breakfast. 10/14/22  Yes Ennever, Rudell Cobb, MD  methylPREDNISolone (MEDROL) 4 MG TBPK tablet Take as directed Patient taking differently: Take 4 mg by mouth See admin instructions. Day 1 take six pills by mouth, day 2 take 5 pills by mouth, day 3 take 4 pills by mouth, day 4 take 3 pills by mouth, day 5 take 2 pills by mouth, Day 6 take 1 pill by mouth per patient 12/30/22  Yes Ennever, Rudell Cobb, MD  Multiple Vitamins-Minerals (ONE-A-DAY WOMENS 50 PLUS) TABS Take 1 tablet by mouth daily.   Yes [provider]  ondansetron (ZOFRAN) 8 MG tablet Take 1 tablet (8 mg total) by mouth every 8 (eight) hours as needed for nausea or vomiting. 12/18/21  Yes Ennever, Rudell Cobb, MD  oxyCODONE (OXY IR/ROXICODONE) 5 MG immediate release tablet Take 1-2 tablets (5-10 mg total) by mouth every 4 (four) hours as needed. Patient taking differently: Take 5-10 mg by mouth every 4 (four) hours as needed for moderate pain. 12/28/22 06/26/23 Yes Ennever, Rudell Cobb, MD  PIQRAY, 300 MG DAILY DOSE, tablet TAKE 2 TABLETS (300 MG) DAILY WITH FOOD Patient taking differently: Take 600 mg by mouth daily. 12/15/22  Yes Volanda Napoleon, MD  prochlorperazine (COMPAZINE) 10 MG tablet Take 1 tablet (10 mg total) by mouth every 6 (six) hours as needed for nausea or vomiting. 07/20/22  Yes Mikey Bussing  R, NP     Family History  Problem Relation Age of Onset   Breast cancer Mother    Hypertension Mother    Colon cancer Father    Hypertension Father    Alcoholism Brother    Alcoholism Brother     Social History   Socioeconomic History   Marital status: Widowed    Spouse name: Not on file   Number of children: Not on file   Years of education: Not on file   Highest education level: Not on file  Occupational History   Not on file  Tobacco Use   Smoking status: Never   Smokeless tobacco: Never  Vaping Use   Vaping Use: Never used  Substance  and Sexual Activity   Alcohol use: Not Currently   Drug use: Not Currently   Sexual activity: Not Currently  Other Topics Concern   Not on file  Social History Narrative   Not on file   Social Determinants of Health   Financial Resource Strain: Not on file  Food Insecurity: Not on file  Transportation Needs: Not on file  Physical Activity: Not on file  Stress: Not on file  Social Connections: Not on file     Review of Systems: A 12 point ROS discussed and pertinent positives are indicated in the HPI above.  All other systems are negative.  Review of Systems  Vital Signs: BP 139/85 (BP Location: Left Arm)   Pulse 79   Temp (!) 97.5 F (36.4 C) (Oral)   Resp 18   Ht 5\' 8"  (1.727 m)   Wt 159 lb (72.1 kg)   SpO2 99%   BMI 24.18 kg/m   Physical Exam Vitals reviewed.  Constitutional:      Appearance: Normal appearance.  HENT:     Head: Normocephalic and atraumatic.  Eyes:     Extraocular Movements: Extraocular movements intact.  Cardiovascular:     Rate and Rhythm: Normal rate and regular rhythm.  Pulmonary:     Effort: Pulmonary effort is normal. No respiratory distress.     Breath sounds: Normal breath sounds.  Abdominal:     Palpations: Abdomen is soft.  Musculoskeletal:        General: Normal range of motion.     Cervical back: Normal range of motion.  Skin:    General: Skin is warm and dry.  Neurological:     General: No focal deficit present.     Mental Status: She is alert and oriented to person, place, and time.  Psychiatric:        Mood and Affect: Mood normal.        Behavior: Behavior normal.        Thought Content: Thought content normal.        Judgment: Judgment normal.     Imaging: MR LIVER W WO CONTRAST  Result Date: 01/05/2023 CLINICAL DATA:  History of breast cancer. Evaluate for liver metastases. Elevated LFTs. EXAM: MRI ABDOMEN WITHOUT AND WITH CONTRAST TECHNIQUE: Multiplanar multisequence MR imaging of the abdomen was performed both  before and after the administration of intravenous contrast. CONTRAST:  71mL GADAVIST GADOBUTROL 1 MMOL/ML IV SOLN COMPARISON:  CT chest, abdomen and pelvis from 01/03/2023 and PET-CT from 09/11/2022 FINDINGS: Lower chest: Small left pleural effusion identified. Hepatobiliary: Numerous T2 hyperintense and T1 hypointense lesions are identified throughout the liver. These exhibit typical rim enhancement seen with liver metastases. These lesions appear new when compared with previous imaging prior to 09/11/2022. Index lesions include: -  lesion in segment 4 B measures 1.4 x 1.2 cm, image 67/1002 -Index lesion within segment 7 measures 1.4 x 0.9 cm, image 39/1002. -lesion in segment 6 measures 0.7 x 0.7 cm, image 95/1002 The gallbladder appears surgically absent. There is increase caliber of the common bile duct which measures up to 9 mm. No choledocholithiasis identified. Pancreas: No signs of pancreatic inflammation or mass. Mild increase caliber of the main pancreatic duct measures upper limits of normal at 3 mm, image 23/3. Spleen:  Within normal limits in size and appearance. Adrenals/Urinary Tract: Normal adrenal glands. No nephrolithiasis, hydronephrosis or suspicious mass. Stomach/Bowel: Stomach appears within normal limits. No dilated bowel loops identified. No bowel wall thickening or inflammation noted. Vascular/Lymphatic: Normal caliber of the abdominal aorta. No abdominal adenopathy. Other:  No ascites or focal fluid collections. Musculoskeletal: Mild diffuse body wall edema suggesting anasarca. The bone marrow is diffusely abnormal compatible with diffuse osseous metastasis. IMPRESSION: 1. Numerous liver metastases are identified which appear new when compared with previous imaging prior to 09/11/2022 2. Diffuse osseous metastasis. 3. Mild increase caliber of the common bile duct and main pancreatic duct. No choledocholithiasis or obstructing mass identified. 4. Small left pleural effusion. 5. Mild diffuse  body wall edema suggesting anasarca. Electronically Signed   By: Kerby Moors M.D.   On: 01/05/2023 09:41   CT CHEST ABDOMEN PELVIS W CONTRAST  Result Date: 01/03/2023 CLINICAL DATA:  Cancer-related pain. Currently on chemotherapy for metastatic breast cancer. EXAM: CT CHEST, ABDOMEN, AND PELVIS WITH CONTRAST TECHNIQUE: Multidetector CT imaging of the chest, abdomen and pelvis was performed following the standard protocol during bolus administration of intravenous contrast. RADIATION DOSE REDUCTION: This exam was performed according to the departmental dose-optimization program which includes automated exposure control, adjustment of the mA and/or kV according to patient size and/or use of iterative reconstruction technique. CONTRAST:  17mL OMNIPAQUE IOHEXOL 350 MG/ML SOLN COMPARISON:  PET-CT 09/11/2022. CT chest abdomen and pelvis 12/16/2020 FINDINGS: CT CHEST FINDINGS Cardiovascular: Normal heart size. No pericardial effusions. Normal caliber thoracic aorta. No aortic dissection. Great vessel origins are patent. Mediastinum/Nodes: Thyroid gland is unremarkable. Esophagus is decompressed. No significant lymphadenopathy in the chest. Lungs/Pleura: Lungs are clear. No pulmonary nodules, consolidation, or airspace disease. Small left pleural effusion. The effusion is new since the prior PET-CT. Musculoskeletal: Diffuse skeletal metastasis throughout the visualized bones with mixed lucency and sclerosis. Old rib fractures. New since the prior study, there is soft tissue lesion with low-attenuation anterior to the mid sternum. This could represent soft tissue tumor with tumor necrosis or an abscess. This is new since the prior PET-CT. CT ABDOMEN PELVIS FINDINGS Hepatobiliary: Several vague low-attenuation lesions demonstrated in the liver. Most prominent is in the anterior segment of the right lobe of the liver, measuring 1.7 cm diameter. This lesion was not seen on prior studies and no uptake was shown on the  PET-CT. This may represent developing hepatic metastasis. Gallbladder is surgically absent. No bile duct dilatation. Pancreas: Unremarkable. No pancreatic ductal dilatation or surrounding inflammatory changes. Spleen: Normal in size without focal abnormality. Adrenals/Urinary Tract: No adrenal gland nodules. Kidneys are symmetrical with homogeneous nephrograms. No hydronephrosis or hydroureter. Bladder is unremarkable. Stomach/Bowel: Stomach, small bowel, and colon are not abnormally distended. No wall thickening or inflammatory changes are appreciated. Appendix is surgically absent. Vascular/Lymphatic: No significant vascular findings are present. No enlarged abdominal or pelvic lymph nodes. Reproductive: Uterus is not enlarged. Cyst in the left ovary measuring 4.6 x 3.5 cm. This is unchanged since prior  study. Other: Small amount of free fluid in the upper abdomen. No free air. Abdominal wall musculature appears intact. Soft tissue edema in the subcutaneous fat. Musculoskeletal: Diffuse skeletal metastasis throughout the visualized bones with mixed lucent and sclerotic change. IMPRESSION: 1. Diffuse skeletal metastasis. 2. New since prior study, there is low-attenuation soft tissue lesion anterior to the sternum possibly representing soft tissue tumor or abscess. 3. New since the prior study are a small left pleural effusion and small amount of free fluid in the upper abdomen. 4. New since the prior study are vague low-attenuation lesions in the liver possibly representing developing metastasis. 5. Unchanged appearance of 4.6 cm left ovarian cyst. Due to the stability of this lesion, no additional imaging follow-up is recommended. Note: This recommendation does not apply to premenarchal patients and to those with increased risk (genetic, family history, elevated tumor markers or other high-risk factors) of ovarian cancer. Reference: JACR 2020 Feb; 17(2):248-254 Electronically Signed   By: Lucienne Capers M.D.    On: 01/03/2023 20:15    Labs:  CBC: Recent Labs    12/09/22 0829 01/03/23 1550 01/03/23 2022 01/05/23 0109  WBC 10.4 14.8* 14.6* 13.2*  HGB 10.7* 10.7* 9.6* 9.9*  HCT 34.3* 34.3* 29.9* 30.9*  PLT 231 251 225 227    COAGS: No results for input(s): "INR", "APTT" in the last 8760 hours.  BMP: Recent Labs    12/09/22 0829 01/03/23 1550 01/03/23 2022 01/04/23 0308 01/05/23 0109  NA 142 129*  --  132* 132*  K 4.4 3.9  --  3.5 4.0  CL 101 95*  --  99 96*  CO2 29 21*  --  23 25  GLUCOSE 213* 391*  --  240* 407*  BUN 16 17  --  14 19  CALCIUM 10.0 8.6*  --  7.9* 8.2*  CREATININE 0.79 0.71 0.68 0.66 0.78  GFRNONAA >60 >60 >60 >60 >60    LIVER FUNCTION TESTS: Recent Labs    12/09/22 0829 01/03/23 1550 01/04/23 0308 01/05/23 0109  BILITOT 0.3 1.0 0.9 0.8  AST 20 166* 141* 77*  ALT 12 45* 35 60*  ALKPHOS 134* 249* 217* 242*  PROT 7.0 6.6 5.7* 5.6*  ALBUMIN 4.1 2.7* 2.3* 2.1*    TUMOR MARKERS: No results for input(s): "AFPTM", "CEA", "CA199", "CHROMGRNA" in the last 8760 hours.  Assessment and Plan:  Metastatic breast cancer = CT chest abdomen pelvis was obtained which demonstrated diffuse skeletal metastasis including new lesions in the sternum, and new pleural effusion and new liver mets.   Will proceed with image guided bone marrow biopsy tomorrow as IR schedule allows.  Risks and benefits of bone marrow biopsy was discussed with the patient and/or patient's family including, but not limited to bleeding, infection, damage to adjacent structures or low yield requiring additional tests.  All of the questions were answered and there is agreement to proceed.  Consent signed and in IR.  Thank you for allowing our service to participate in Annette James 's care.  Electronically Signed: Murrell Redden, PA-C   01/05/2023, 11:29 AM      I spent a total of 20 Minutes  in face to face in clinical consultation, greater than 50% of which was  counseling/coordinating care for bone marrow biopsy.

## 2023-01-05 NOTE — Progress Notes (Signed)
Palliative:  HPI: 62 y.o. 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 Marin Olp; s/p radiation with Dr. Sondra Come) admitted on 01/03/2023 with uncontrolled pain related to cancer. CT chest , abd/pelvis with concerns for diffuse skeletal mets with new lesions in sternum, new pleural effusion, and new liver mets.   I met today at Annette James's bedside. She is noticeably feeling like a new person today. She is smiling and in good spirits. She reports significant improvement in her pain yesterday evening and has been much improved since then. We agree to continue current regimen but will provide OxyIR 10-15 mg so she can take her regular dose but allow for more if not working as well. Will continue steroids and toradol. She agrees that there is no need to increase fentanyl patch at this time. She is very appreciative to her medical team and pleased she is feeling so much better. Dr. Marin Olp has recommended further testing and work up while hospitalized. When done with testing she is prepared to return home. We did discuss outpatient palliative availability and she would like to follow up with my colleague, Lexine Baton (Corlis Hove) Dalton NP, at [facility]. Referral made at patient request to follow up and assist with symptom management and GOC over time as needed.   Exam: Alert, oriented. No distress. Good spirits. Breathing regular, unlabored. Abd soft. Moves all extremities and moves with ease around in bed without pain.  Plan: Pain: Fentanyl 50 mcg/hr patch. OxyIR 10-15 mg q4h PRN mod pain.  Morphine 2 mg IV q2h PRN severe pain.  Decadron IV 6 mg daily. PO at d/c.  Toradol 30 mg q8h x 5 doses per Dr. Marin Olp.  Zometa q3 months with Dr. Marin Olp.  Nausea: Zofran 8 mg q8 PRN.  Compazine 5 mg IV q6h PRN breakthrough nausea.  Bowel Regimen: Senna 1 tab BID. Miralax daily.  Referral to [facility] outpatient palliative [facility] Lexine Baton) [place], NP.   35 min  Vinie Sill, NP Palliative Medicine Team Pager [phone] (Please see [email or web address] for schedule) Team Phone [phone]    Greater than 50%  of this time was spent counseling and coordinating care related to the above assessment and plan

## 2023-01-05 NOTE — Inpatient Diabetes Management (Addendum)
Inpatient Diabetes Program Recommendations  AACE/ADA: New Consensus Statement on Inpatient Glycemic Control (2015)  Target Ranges:  Prepandial:   less than 140 mg/dL      Peak postprandial:   less than 180 mg/dL (1-2 hours)      Critically ill patients:  140 - 180 mg/dL   Lab Results  Component Value Date   GLUCAP 390 (H) 01/05/2023   HGBA1C 5.1 08/20/2022    Latest Reference Range & Units 01/04/23 08:29 01/04/23 12:41 01/04/23 15:59 01/04/23 20:21 01/05/23 07:55  Glucose-Capillary 70 - 99 mg/dL 179 (H) 184 (H) 178 (H) 326 (H) 390 (H)  (H): Data is abnormally high  Diabetes history: DM2 Outpatient Diabetes medications: Amaryl 2 mg QD with breakfast  Current orders for Inpatient glycemic control: Novolog 0-15 units tid, Decadron 6 mg qd  Inpatient Diabetes Program Recommendations:   Please consider while on Decadron: -Increase Novolog correction to 0-20 units tid, 0-5 units hs -Add Novolog 3 units tid meal coverage if eating 50% meals  Thank you, Annette Roys E. Amonda Brillhart, RN, MSN, CDE  Diabetes Coordinator Inpatient Glycemic Control Team Team Pager [phone] (8am-5pm) 01/05/2023 10:51 AM

## 2023-01-05 NOTE — Plan of Care (Signed)
  Problem: Pain Managment: Goal: General experience of comfort will improve Outcome: Progressing   Problem: Nutritional: Goal: Maintenance of adequate nutrition will improve Outcome: Progressing

## 2023-01-05 NOTE — Progress Notes (Addendum)
PROGRESS NOTE    Annette James  [id number] DOB: August 26, 1961 DOA: 01/03/2023 PCP: [name], MD  Outpatient Specialists: oncology    Brief Narrative:   From admission h and p  Annette James is a 62 y.o. female with medical history significant of metastatic breast cancer who presented to the department due to uncontrolled pain.  Patient states that she has been having uncontrolled pain in her joints upper back and shoulders.  She saw her oncologist who gave her steroid pack without improvement.  She takes fentanyl and oxycodone as an outpatient.  Due to uncontrolled pain she presented to the ER.  On arrival she was found to be tachycardic in the 120s and hemodynamically stable.  Labs were obtained which revealed sodium 129, bicarb 21, glucose 391, AST 166, ALT 45, alkaline phosphatase 249, WBC 14.8, hemoglobin 10.7, CT chest abdomen pelvis was obtained which demonstrated diffuse skeletal metastasis including new lesions in the sternum, and new pleural effusion and new liver mets.  Patient was given IV analgesia with persistent pain so was admitted for pain management.  On admission she was resting comfortably.  She endorsed persistent pain in her back and requests new pain meds.  She denied any infectious complaints and she did not have any shortness of breath or chest pain   Assessment & Plan:   Principal Problem:   Cancer-related breakthrough pain Active Problems:   Breast cancer metastasized to bone, unspecified laterality (HCC)   Class 1 obesity  # Cancer-related skeletal pain CT of c/a/p shows diffuse skeletal metastasis. Patient with diffuse pain shoulders, arms, left leg. Chronic problem significantly worse past couple of weeks. Failed oral steroid. Palliative following and pain is better controlled today - continue home fentanyl 50 mcg - have added oxy IR 10-15 q4 prn, morphine 2 IV for severe pain, toradol. Has been receiving zometa q 3 months as outpatient.   - will d/c decadron given severe hyperglycemia - zofran/compazine for nausea - senna and miralax - will need outpatient palliative referral  # Metastatic breast cancer With what appear to be new meds to liver - bone scan today - bone marrow biopsy tomorrow - have messaged dr. Marin Olp to see what else needs to be accomplished inpatient  # Hyperglycemia 2/2 steroids. Worsened today - increase sliding scale, add mealtime, add semglee, d/c decadron   DVT prophylaxis: lovenox Code Status: full Family Communication: none @ bedside  Level of care: Med-Surg Status is: Inpatient Remains inpatient appropriate because: need for inpatient workup    Consultants:  Oncology, palliative  Procedures: none  Antimicrobials:  none    Subjective: Pain improved from yesterday, no other complaints  Objective: Vitals:   01/04/23 1557 01/04/23 2019 01/05/23 0557 01/05/23 0758  BP: (!) 160/67 123/79 (!) 145/77 139/85  Pulse: (!) 103  73 79  Resp: 16 17  18   Temp: 99.1 F (37.3 C) 98.3 F (36.8 C) (!) 97.4 F (36.3 C) (!) 97.5 F (36.4 C)  TempSrc: Oral Oral Oral Oral  SpO2: 98% 97% 100% 99%  Weight:      Height:       No intake or output data in the 24 hours ending 01/05/23 1426  Filed Weights   01/03/23 1455  Weight: 72.1 kg    Examination:  General exam: NAD Respiratory system: Clear to auscultation. Respiratory effort normal. Cardiovascular system: S1 & S2 heard, RRR. No JVD, murmurs, rubs, gallops or clicks. No pedal edema. Gastrointestinal system: Abdomen is nondistended, soft and nontender.  No organomegaly or masses felt. Normal bowel sounds heard. Central nervous system: Alert and oriented. No focal neurological deficits. Extremities: Symmetric 5 x 5 power. Skin: No rashes, lesions or ulcers Psychiatry: Judgement and insight appear normal. Mood & affect appropriate.     Data Reviewed: I have personally reviewed following labs and imaging  studies  CBC: Recent Labs  Lab 01/03/23 1550 01/03/23 2022 01/05/23 0109  WBC 14.8* 14.6* 13.2*  NEUTROABS 11.8*  --  12.4*  HGB 10.7* 9.6* 9.9*  HCT 34.3* 29.9* 30.9*  MCV 86.2 84.9 85.1  PLT 251 225 [id number]   Basic Metabolic Panel: Recent Labs  Lab 01/03/23 1550 01/03/23 2022 01/04/23 0308 01/05/23 0109  NA 129*  --  132* 132*  K 3.9  --  3.5 4.0  CL 95*  --  99 96*  CO2 21*  --  23 25  GLUCOSE 391*  --  240* 407*  James 17  --  14 19  CREATININE 0.71 0.68 0.66 0.78  CALCIUM 8.6*  --  7.9* 8.2*   GFR: Estimated Creatinine Clearance: 76.4 mL/min (by C-G formula based on SCr of 0.78 mg/dL). Liver Function Tests: Recent Labs  Lab 01/03/23 1550 01/04/23 0308 01/05/23 0109  AST 166* 141* 77*  ALT 45* 35 60*  ALKPHOS 249* 217* 242*  BILITOT 1.0 0.9 0.8  PROT 6.6 5.7* 5.6*  ALBUMIN 2.7* 2.3* 2.1*   No results for input(s): "LIPASE", "AMYLASE" in the last 168 hours. No results for input(s): "AMMONIA" in the last 168 hours. Coagulation Profile: No results for input(s): "INR", "PROTIME" in the last 168 hours. Cardiac Enzymes: No results for input(s): "CKTOTAL", "CKMB", "CKMBINDEX", "TROPONINI" in the last 168 hours. BNP (last 3 results) No results for input(s): "PROBNP" in the last 8760 hours. HbA1C: No results for input(s): "HGBA1C" in the last 72 hours. CBG: Recent Labs  Lab 01/04/23 1241 01/04/23 1559 01/04/23 2021 01/05/23 0755 01/05/23 1210  GLUCAP 184* 178* 326* 390* 352*   Lipid Profile: No results for input(s): "CHOL", "HDL", "LDLCALC", "TRIG", "CHOLHDL", "LDLDIRECT" in the last 72 hours. Thyroid Function Tests: No results for input(s): "TSH", "T4TOTAL", "FREET4", "T3FREE", "THYROIDAB" in the last 72 hours. Anemia Panel: Recent Labs    01/05/23 0109  TIBC 174*  IRON 34  RETICCTPCT 2.0   Urine analysis:    Component Value Date/Time   COLORURINE AMBER (A) 10/18/2020 0819   APPEARANCEUR CLOUDY (A) 10/18/2020 0819   LABSPEC 1.028 10/18/2020  0819   PHURINE 5.0 10/18/2020 0819   GLUCOSEU NEGATIVE 10/18/2020 0819   HGBUR NEGATIVE 10/18/2020 0819   BILIRUBINUR NEGATIVE 10/18/2020 0819   KETONESUR 5 (A) 10/18/2020 0819   PROTEINUR 100 (A) 10/18/2020 0819   NITRITE NEGATIVE 10/18/2020 0819   LEUKOCYTESUR NEGATIVE 10/18/2020 0819   Sepsis Labs: @LABRCNTIP (procalcitonin:4,lacticidven:4)  )No results found for this or any previous visit (from the past 240 hour(s)).       Radiology Studies: MR LIVER W WO CONTRAST  Result Date: 01/05/2023 CLINICAL DATA:  History of breast cancer. Evaluate for liver metastases. Elevated LFTs. EXAM: MRI ABDOMEN WITHOUT AND WITH CONTRAST TECHNIQUE: Multiplanar multisequence MR imaging of the abdomen was performed both before and after the administration of intravenous contrast. CONTRAST:  28mL GADAVIST GADOBUTROL 1 MMOL/ML IV SOLN COMPARISON:  CT chest, abdomen and pelvis from 01/03/2023 and PET-CT from 09/11/2022 FINDINGS: Lower chest: Small left pleural effusion identified. Hepatobiliary: Numerous T2 hyperintense and T1 hypointense lesions are identified throughout the liver. These exhibit typical rim enhancement seen with liver  metastases. These lesions appear new when compared with previous imaging prior to 09/11/2022. Index lesions include: -lesion in segment 4 B measures 1.4 x 1.2 cm, image 67/1002 -Index lesion within segment 7 measures 1.4 x 0.9 cm, image 39/1002. -lesion in segment 6 measures 0.7 x 0.7 cm, image 95/1002 The gallbladder appears surgically absent. There is increase caliber of the common bile duct which measures up to 9 mm. No choledocholithiasis identified. Pancreas: No signs of pancreatic inflammation or mass. Mild increase caliber of the main pancreatic duct measures upper limits of normal at 3 mm, image 23/3. Spleen:  Within normal limits in size and appearance. Adrenals/Urinary Tract: Normal adrenal glands. No nephrolithiasis, hydronephrosis or suspicious mass. Stomach/Bowel: Stomach  appears within normal limits. No dilated bowel loops identified. No bowel wall thickening or inflammation noted. Vascular/Lymphatic: Normal caliber of the abdominal aorta. No abdominal adenopathy. Other:  No ascites or focal fluid collections. Musculoskeletal: Mild diffuse body wall edema suggesting anasarca. The bone marrow is diffusely abnormal compatible with diffuse osseous metastasis. IMPRESSION: 1. Numerous liver metastases are identified which appear new when compared with previous imaging prior to 09/11/2022 2. Diffuse osseous metastasis. 3. Mild increase caliber of the common bile duct and main pancreatic duct. No choledocholithiasis or obstructing mass identified. 4. Small left pleural effusion. 5. Mild diffuse body wall edema suggesting anasarca. Electronically Signed   By: Kerby Moors M.D.   On: 01/05/2023 09:41   CT CHEST ABDOMEN PELVIS W CONTRAST  Result Date: 01/03/2023 CLINICAL DATA:  Cancer-related pain. Currently on chemotherapy for metastatic breast cancer. EXAM: CT CHEST, ABDOMEN, AND PELVIS WITH CONTRAST TECHNIQUE: Multidetector CT imaging of the chest, abdomen and pelvis was performed following the standard protocol during bolus administration of intravenous contrast. RADIATION DOSE REDUCTION: This exam was performed according to the departmental dose-optimization program which includes automated exposure control, adjustment of the mA and/or kV according to patient size and/or use of iterative reconstruction technique. CONTRAST:  43mL OMNIPAQUE IOHEXOL 350 MG/ML SOLN COMPARISON:  PET-CT 09/11/2022. CT chest abdomen and pelvis 12/16/2020 FINDINGS: CT CHEST FINDINGS Cardiovascular: Normal heart size. No pericardial effusions. Normal caliber thoracic aorta. No aortic dissection. Great vessel origins are patent. Mediastinum/Nodes: Thyroid gland is unremarkable. Esophagus is decompressed. No significant lymphadenopathy in the chest. Lungs/Pleura: Lungs are clear. No pulmonary nodules,  consolidation, or airspace disease. Small left pleural effusion. The effusion is new since the prior PET-CT. Musculoskeletal: Diffuse skeletal metastasis throughout the visualized bones with mixed lucency and sclerosis. Old rib fractures. New since the prior study, there is soft tissue lesion with low-attenuation anterior to the mid sternum. This could represent soft tissue tumor with tumor necrosis or an abscess. This is new since the prior PET-CT. CT ABDOMEN PELVIS FINDINGS Hepatobiliary: Several vague low-attenuation lesions demonstrated in the liver. Most prominent is in the anterior segment of the right lobe of the liver, measuring 1.7 cm diameter. This lesion was not seen on prior studies and no uptake was shown on the PET-CT. This may represent developing hepatic metastasis. Gallbladder is surgically absent. No bile duct dilatation. Pancreas: Unremarkable. No pancreatic ductal dilatation or surrounding inflammatory changes. Spleen: Normal in size without focal abnormality. Adrenals/Urinary Tract: No adrenal gland nodules. Kidneys are symmetrical with homogeneous nephrograms. No hydronephrosis or hydroureter. Bladder is unremarkable. Stomach/Bowel: Stomach, small bowel, and colon are not abnormally distended. No wall thickening or inflammatory changes are appreciated. Appendix is surgically absent. Vascular/Lymphatic: No significant vascular findings are present. No enlarged abdominal or pelvic lymph nodes. Reproductive: Uterus is not  enlarged. Cyst in the left ovary measuring 4.6 x 3.5 cm. This is unchanged since prior study. Other: Small amount of free fluid in the upper abdomen. No free air. Abdominal wall musculature appears intact. Soft tissue edema in the subcutaneous fat. Musculoskeletal: Diffuse skeletal metastasis throughout the visualized bones with mixed lucent and sclerotic change. IMPRESSION: 1. Diffuse skeletal metastasis. 2. New since prior study, there is low-attenuation soft tissue lesion  anterior to the sternum possibly representing soft tissue tumor or abscess. 3. New since the prior study are a small left pleural effusion and small amount of free fluid in the upper abdomen. 4. New since the prior study are vague low-attenuation lesions in the liver possibly representing developing metastasis. 5. Unchanged appearance of 4.6 cm left ovarian cyst. Due to the stability of this lesion, no additional imaging follow-up is recommended. Note: This recommendation does not apply to premenarchal patients and to those with increased risk (genetic, family history, elevated tumor markers or other high-risk factors) of ovarian cancer. Reference: JACR 2020 Feb; 17(2):248-254 Electronically Signed   By: Lucienne Capers M.D.   On: 01/03/2023 20:15        Scheduled Meds:  dexamethasone (DECADRON) injection  6 mg Intravenous Q24H   enoxaparin (LOVENOX) injection  40 mg Subcutaneous Q24H   fentaNYL  1 patch Transdermal Q72H   fulvestrant  500 mg Intramuscular Once   insulin aspart  0-20 Units Subcutaneous TID WC   insulin aspart  0-5 Units Subcutaneous QHS   insulin aspart  3 Units Subcutaneous TID WC   ketorolac  30 mg Intravenous Q8H   polyethylene glycol  34 g Oral Daily   senna  1 tablet Oral BID   Continuous Infusions:  ondansetron (ZOFRAN) IV       LOS: 2 days     Desma Maxim, MD [facility]   If 7PM-7AM, please contact night-coverage [email or web address] Password [email or web address] 01/05/2023, 2:26 PM

## 2023-01-05 NOTE — Progress Notes (Signed)
Annette James feels a whole lot better.  She is not complaining of the joint aches and pains.  The adjustment with her pain medicine that were suggested by Palliative Care really have helped.  Blood sugar still is on the high side.  We really need to get her blood sugar under better control.  Her LFTs also are a little bit on the higher side.  There was suspect that she probably has little bit of iron deficiency.  I will give her some IV iron but we will wait until after she has the MRI of the liver.  I still think that we have to do a liver biopsy.  We need to biopsy something that will give [place] a specimen of cancer so we can send off for molecular testing.  She should be having the MRI today.  I also put in for a bone scan today.  The bone marrow test probably will be tomorrow.  I would like to try to get as much done as possible while she is in the hospital.  Again her blood sugars are quite high.  We really need to get these under better control.  Her CBC today shows white count 13.2.  Hemoglobin 9.9.  Platelet count 227,000.  She has had no fever.  There has been no bleeding.  Her vital signs are temperature 97.4.  Pulse 73.  Blood pressure 145/77.  Her lungs sound clear bilaterally.  Cardiac exam regular rate and rhythm.  Abdomen is soft.  Bowel sounds are present.  There is no fluid wave.  There is no guarding or rebound tenderness.  There is no palpable liver or spleen tip.  Extremity shows no clubbing, cyanosis or edema.  Neurological exam is nonfocal.  Ms. Abboud came in with pain that was out of control.  She is doing much better now.  Again I think Palliative Care for helping out.  We now have to restage her cancer.  Again we probably will need to get a biopsy of something so that we get tumor specimen we can send off for molecular studies.  I suspect she probably will need to have a Port-A-Cath placed while she is in the hospital.  This will be in anticipation of chemotherapy which  we really need to use at this point.  I do appreciate the great care that she is getting from all the staff upon 6 [address].   Lattie Haw, MD  [place] 15:15

## 2023-01-06 ENCOUNTER — Inpatient Hospital Stay (HOSPITAL_COMMUNITY): Payer: BC Managed Care – PPO

## 2023-01-06 ENCOUNTER — Inpatient Hospital Stay: Payer: BC Managed Care – PPO | Admitting: Family

## 2023-01-06 ENCOUNTER — Inpatient Hospital Stay: Payer: BC Managed Care – PPO

## 2023-01-06 ENCOUNTER — Encounter: Payer: Self-pay | Admitting: *Deleted

## 2023-01-06 ENCOUNTER — Encounter (HOSPITAL_COMMUNITY): Payer: Self-pay | Admitting: Internal Medicine

## 2023-01-06 DIAGNOSIS — G893 Neoplasm related pain (acute) (chronic): Secondary | ICD-10-CM | POA: Diagnosis not present

## 2023-01-06 HISTORY — PX: IR US GUIDE BX ASP/DRAIN: IMG2392

## 2023-01-06 HISTORY — PX: IR IMAGING GUIDED PORT INSERTION: IMG5740

## 2023-01-06 LAB — CBC WITH DIFFERENTIAL/PLATELET
Abs Immature Granulocytes: 1.06 10*3/uL — ABNORMAL HIGH (ref 0.00–0.07)
Basophils Absolute: 0 10*3/uL (ref 0.0–0.1)
Basophils Relative: 0 %
Eosinophils Absolute: 0 10*3/uL (ref 0.0–0.5)
Eosinophils Relative: 0 %
HCT: 28.4 % — ABNORMAL LOW (ref 36.0–46.0)
Hemoglobin: 8.9 g/dL — ABNORMAL LOW (ref 12.0–15.0)
Immature Granulocytes: 9 %
Lymphocytes Relative: 4 %
Lymphs Abs: 0.5 10*3/uL — ABNORMAL LOW (ref 0.7–4.0)
MCH: 26.7 pg (ref 26.0–34.0)
MCHC: 31.3 g/dL (ref 30.0–36.0)
MCV: 85.3 fL (ref 80.0–100.0)
Monocytes Absolute: 0.9 10*3/uL (ref 0.1–1.0)
Monocytes Relative: 7 %
Neutro Abs: 9.4 10*3/uL — ABNORMAL HIGH (ref 1.7–7.7)
Neutrophils Relative %: 80 %
Platelets: 263 10*3/uL (ref 150–400)
RBC: 3.33 MIL/uL — ABNORMAL LOW (ref 3.87–5.11)
RDW: 17.5 % — ABNORMAL HIGH (ref 11.5–15.5)
WBC: 11.8 10*3/uL — ABNORMAL HIGH (ref 4.0–10.5)
nRBC: 0.3 % — ABNORMAL HIGH (ref 0.0–0.2)

## 2023-01-06 LAB — GLUCOSE, CAPILLARY
Glucose-Capillary: 301 mg/dL — ABNORMAL HIGH (ref 70–99)
Glucose-Capillary: 277 mg/dL — ABNORMAL HIGH (ref 70–99)
Glucose-Capillary: 223 mg/dL — ABNORMAL HIGH (ref 70–99)
Glucose-Capillary: 215 mg/dL — ABNORMAL HIGH (ref 70–99)
Glucose-Capillary: 179 mg/dL — ABNORMAL HIGH (ref 70–99)

## 2023-01-06 LAB — CANCER ANTIGEN 27.29: CA 27.29: 144.9 U/mL — ABNORMAL HIGH (ref 0.0–38.6)

## 2023-01-06 LAB — PROTIME-INR
INR: 1.1 (ref 0.8–1.2)
Prothrombin Time: 14.4 seconds (ref 11.4–15.2)

## 2023-01-06 MED ORDER — HEPARIN SOD (PORK) LOCK FLUSH 100 UNIT/ML IV SOLN
INTRAVENOUS | Status: AC
  Filled 2023-01-06: qty 5

## 2023-01-06 MED ORDER — MIDAZOLAM HCL 2 MG/2ML IJ SOLN
INTRAMUSCULAR | Status: AC
  Filled 2023-01-06: qty 2

## 2023-01-06 MED ORDER — FENTANYL CITRATE (PF) 100 MCG/2ML IJ SOLN
INTRAMUSCULAR | Status: AC
  Filled 2023-01-06: qty 2

## 2023-01-06 MED ORDER — LIDOCAINE-EPINEPHRINE 1 %-1:100000 IJ SOLN
INTRAMUSCULAR | Status: AC
  Administered 2023-01-06: 20 mL
  Filled 2023-01-06: qty 1

## 2023-01-06 MED ORDER — FENTANYL CITRATE (PF) 100 MCG/2ML IJ SOLN
INTRAMUSCULAR | Status: AC | PRN
  Administered 2023-01-06 (×2): 25 ug via INTRAVENOUS
  Administered 2023-01-06: 50 ug via INTRAVENOUS
  Administered 2023-01-06: 25 ug via INTRAVENOUS

## 2023-01-06 MED ORDER — MIDAZOLAM HCL 2 MG/2ML IJ SOLN
INTRAMUSCULAR | Status: AC | PRN
  Administered 2023-01-06: 1 mg via INTRAVENOUS
  Administered 2023-01-06: .5 mg via INTRAVENOUS

## 2023-01-06 MED ORDER — MIDAZOLAM HCL 2 MG/2ML IJ SOLN
INTRAMUSCULAR | Status: AC | PRN
  Administered 2023-01-06: 1 mg via INTRAVENOUS
  Administered 2023-01-06 (×3): .5 mg via INTRAVENOUS

## 2023-01-06 MED ORDER — CHLORHEXIDINE GLUCONATE CLOTH 2 % EX PADS
6.0000 | MEDICATED_PAD | Freq: Every day | CUTANEOUS | Status: DC
  Administered 2023-01-07 – 2023-01-08 (×2): 6 via TOPICAL

## 2023-01-06 MED ORDER — LIDOCAINE HCL 1 % IJ SOLN
10.0000 mL | Freq: Once | INTRAMUSCULAR | Status: DC
  Filled 2023-01-06: qty 10

## 2023-01-06 MED ORDER — LIDOCAINE HCL 1 % IJ SOLN
INTRAMUSCULAR | Status: AC
  Filled 2023-01-06: qty 20

## 2023-01-06 MED ORDER — FENTANYL CITRATE (PF) 100 MCG/2ML IJ SOLN
INTRAMUSCULAR | Status: AC | PRN
  Administered 2023-01-06: 50 ug via INTRAVENOUS
  Administered 2023-01-06: 25 ug via INTRAVENOUS

## 2023-01-06 NOTE — Progress Notes (Signed)
Referring Physician(s): Dr. Marin Olp  Supervising Physician: Markus Daft  Patient Status:  Annette James - In-pt  Chief Complaint: Metastatic breast cancer  Subjective: Resting comfortably in bed.   Discussed additional procedures being considered today- soft tissue vs. Liver lesion biopsy as well as Port-A-Cath placement.  States Dr. Marin Olp has been by this AM and discussed all with her as well.  She would like to proceed with any all procedure being offered to her today to diagnose and treat her cancer.    Allergies: Patient has no known allergies.  Medications: Prior to Admission medications   Medication Sig Start Date End Date Taking? Authorizing Provider  ascorbic acid (VITAMIN C) 500 MG tablet Take 500 mg by mouth daily.   Yes [provider]  cyclobenzaprine (FLEXERIL) 5 MG tablet TAKE 1 TABLET BY MOUTH THREE TIMES A DAY AS NEEDED FOR MUSCLE SPASM Patient taking differently: Take 5 mg by mouth 3 (three) times daily as needed for muscle spasms. 11/23/22  Yes Volanda Napoleon, MD  fentaNYL (DURAGESIC) 50 MCG/HR Place 1 patch onto the skin every other day. 12/08/22 06/06/23 Yes Ennever, Rudell Cobb, MD  glimepiride (AMARYL) 2 MG tablet Take 1 tablet (2 mg total) by mouth daily with breakfast. 10/14/22  Yes Ennever, Rudell Cobb, MD  methylPREDNISolone (MEDROL) 4 MG TBPK tablet Take as directed Patient taking differently: Take 4 mg by mouth See admin instructions. Day 1 take six pills by mouth, day 2 take 5 pills by mouth, day 3 take 4 pills by mouth, day 4 take 3 pills by mouth, day 5 take 2 pills by mouth, Day 6 take 1 pill by mouth per patient 12/30/22  Yes Ennever, Rudell Cobb, MD  Multiple Vitamins-Minerals (ONE-A-DAY WOMENS 50 PLUS) TABS Take 1 tablet by mouth daily.   Yes [provider]  ondansetron (ZOFRAN) 8 MG tablet Take 1 tablet (8 mg total) by mouth every 8 (eight) hours as needed for nausea or vomiting. 12/18/21  Yes Ennever, Rudell Cobb, MD  oxyCODONE (OXY IR/ROXICODONE) 5 MG  immediate release tablet Take 1-2 tablets (5-10 mg total) by mouth every 4 (four) hours as needed. Patient taking differently: Take 5-10 mg by mouth every 4 (four) hours as needed for moderate pain. 12/28/22 06/26/23 Yes Ennever, Rudell Cobb, MD  PIQRAY, 300 MG DAILY DOSE, tablet TAKE 2 TABLETS (300 MG) DAILY WITH FOOD Patient taking differently: Take 600 mg by mouth daily. 12/15/22  Yes Volanda Napoleon, MD  prochlorperazine (COMPAZINE) 10 MG tablet Take 1 tablet (10 mg total) by mouth every 6 (six) hours as needed for nausea or vomiting. 07/20/22  Yes Curcio, Roselie Awkward, NP     Vital Signs: BP 132/74 (BP Location: Left Arm)   Pulse 92   Temp 97.6 F (36.4 C) (Oral)   Resp 17   Ht 5\' 8"  (1.727 m)   Wt 159 lb (72.1 kg)   SpO2 99%   BMI 24.18 kg/m   Physical Exam Vitals and nursing note reviewed.  Constitutional:      General: She is not in acute distress.    Appearance: Normal appearance. She is not ill-appearing.  HENT:     Mouth/Throat:     Mouth: Mucous membranes are moist.     Pharynx: Oropharynx is clear.  Cardiovascular:     Rate and Rhythm: Normal rate and regular rhythm.  Pulmonary:     Effort: Pulmonary effort is normal.     Breath sounds: Normal breath sounds.  Abdominal:  General: Abdomen is flat.     Palpations: Abdomen is soft.  Musculoskeletal:     Cervical back: Normal range of motion and neck supple.  Neurological:     General: No focal deficit present.     Mental Status: She is alert and oriented to person, place, and time. Mental status is at baseline.  Psychiatric:        Mood and Affect: Mood normal.        Behavior: Behavior normal.        Thought Content: Thought content normal.        Judgment: Judgment normal.     Imaging: NM Bone Scan Whole Body  Result Date: 01/05/2023 CLINICAL DATA:  Metastatic breast cancer. Assess treatment response. EXAM: NUCLEAR MEDICINE WHOLE BODY BONE SCAN TECHNIQUE: Whole body anterior and posterior images were obtained  approximately 3 hours after intravenous injection of radiopharmaceutical. RADIOPHARMACEUTICALS:  21.8. mCi Technetium-62m MDP IV COMPARISON:  CT chest, abdomen and pelvis from 01/03/2023 and PET-CT from 06/08/2022. FINDINGS: Multifocal abnormal areas of increased radiotracer uptake are identified throughout the axial and appendicular skeleton compatible with diffuse osseous metastasis. Increased radiotracer uptake is identified within the bilateral frontal bones, bilateral posterior ribs, sternum, and midthoracic spine. Increased uptake within the L4-5 vertebra also noted. There is also increased radiotracer uptake within both sides of the sacrum, and bilateral anterior iliac bones. Tracer uptake within the humeral heads is also noted bilaterally. Compared with the FDG PET from 06/08/2022 the extent, and distribution of bone metastasis appears similar. Normal physiologic tracer uptake seen within the kidneys an urinary bladder. IMPRESSION: 1. Compared with the FDG PET from 06/08/2022 there is a similar appearance of widespread osseous metastasis involving the axial and appendicular skeleton. Electronically Signed   By: Kerby Moors M.D.   On: 01/05/2023 15:26   MR LIVER W WO CONTRAST  Result Date: 01/05/2023 CLINICAL DATA:  History of breast cancer. Evaluate for liver metastases. Elevated LFTs. EXAM: MRI ABDOMEN WITHOUT AND WITH CONTRAST TECHNIQUE: Multiplanar multisequence MR imaging of the abdomen was performed both before and after the administration of intravenous contrast. CONTRAST:  73mL GADAVIST GADOBUTROL 1 MMOL/ML IV SOLN COMPARISON:  CT chest, abdomen and pelvis from 01/03/2023 and PET-CT from 09/11/2022 FINDINGS: Lower chest: Small left pleural effusion identified. Hepatobiliary: Numerous T2 hyperintense and T1 hypointense lesions are identified throughout the liver. These exhibit typical rim enhancement seen with liver metastases. These lesions appear new when compared with previous imaging prior to  09/11/2022. Index lesions include: -lesion in segment 4 B measures 1.4 x 1.2 cm, image 67/1002 -Index lesion within segment 7 measures 1.4 x 0.9 cm, image 39/1002. -lesion in segment 6 measures 0.7 x 0.7 cm, image 95/1002 The gallbladder appears surgically absent. There is increase caliber of the common bile duct which measures up to 9 mm. No choledocholithiasis identified. Pancreas: No signs of pancreatic inflammation or mass. Mild increase caliber of the main pancreatic duct measures upper limits of normal at 3 mm, image 23/3. Spleen:  Within normal limits in size and appearance. Adrenals/Urinary Tract: Normal adrenal glands. No nephrolithiasis, hydronephrosis or suspicious mass. Stomach/Bowel: Stomach appears within normal limits. No dilated bowel loops identified. No bowel wall thickening or inflammation noted. Vascular/Lymphatic: Normal caliber of the abdominal aorta. No abdominal adenopathy. Other:  No ascites or focal fluid collections. Musculoskeletal: Mild diffuse body wall edema suggesting anasarca. The bone marrow is diffusely abnormal compatible with diffuse osseous metastasis. IMPRESSION: 1. Numerous liver metastases are identified which appear new when compared  with previous imaging prior to 09/11/2022 2. Diffuse osseous metastasis. 3. Mild increase caliber of the common bile duct and main pancreatic duct. No choledocholithiasis or obstructing mass identified. 4. Small left pleural effusion. 5. Mild diffuse body wall edema suggesting anasarca. Electronically Signed   By: Kerby Moors M.D.   On: 01/05/2023 09:41   CT CHEST ABDOMEN PELVIS W CONTRAST  Result Date: 01/03/2023 CLINICAL DATA:  Cancer-related pain. Currently on chemotherapy for metastatic breast cancer. EXAM: CT CHEST, ABDOMEN, AND PELVIS WITH CONTRAST TECHNIQUE: Multidetector CT imaging of the chest, abdomen and pelvis was performed following the standard protocol during bolus administration of intravenous contrast. RADIATION DOSE  REDUCTION: This exam was performed according to the departmental dose-optimization program which includes automated exposure control, adjustment of the mA and/or kV according to patient size and/or use of iterative reconstruction technique. CONTRAST:  19mL OMNIPAQUE IOHEXOL 350 MG/ML SOLN COMPARISON:  PET-CT 09/11/2022. CT chest abdomen and pelvis 12/16/2020 FINDINGS: CT CHEST FINDINGS Cardiovascular: Normal heart size. No pericardial effusions. Normal caliber thoracic aorta. No aortic dissection. Great vessel origins are patent. Mediastinum/Nodes: Thyroid gland is unremarkable. Esophagus is decompressed. No significant lymphadenopathy in the chest. Lungs/Pleura: Lungs are clear. No pulmonary nodules, consolidation, or airspace disease. Small left pleural effusion. The effusion is new since the prior PET-CT. Musculoskeletal: Diffuse skeletal metastasis throughout the visualized bones with mixed lucency and sclerosis. Old rib fractures. New since the prior study, there is soft tissue lesion with low-attenuation anterior to the mid sternum. This could represent soft tissue tumor with tumor necrosis or an abscess. This is new since the prior PET-CT. CT ABDOMEN PELVIS FINDINGS Hepatobiliary: Several vague low-attenuation lesions demonstrated in the liver. Most prominent is in the anterior segment of the right lobe of the liver, measuring 1.7 cm diameter. This lesion was not seen on prior studies and no uptake was shown on the PET-CT. This may represent developing hepatic metastasis. Gallbladder is surgically absent. No bile duct dilatation. Pancreas: Unremarkable. No pancreatic ductal dilatation or surrounding inflammatory changes. Spleen: Normal in size without focal abnormality. Adrenals/Urinary Tract: No adrenal gland nodules. Kidneys are symmetrical with homogeneous nephrograms. No hydronephrosis or hydroureter. Bladder is unremarkable. Stomach/Bowel: Stomach, small bowel, and colon are not abnormally distended. No  wall thickening or inflammatory changes are appreciated. Appendix is surgically absent. Vascular/Lymphatic: No significant vascular findings are present. No enlarged abdominal or pelvic lymph nodes. Reproductive: Uterus is not enlarged. Cyst in the left ovary measuring 4.6 x 3.5 cm. This is unchanged since prior study. Other: Small amount of free fluid in the upper abdomen. No free air. Abdominal wall musculature appears intact. Soft tissue edema in the subcutaneous fat. Musculoskeletal: Diffuse skeletal metastasis throughout the visualized bones with mixed lucent and sclerotic change. IMPRESSION: 1. Diffuse skeletal metastasis. 2. New since prior study, there is low-attenuation soft tissue lesion anterior to the sternum possibly representing soft tissue tumor or abscess. 3. New since the prior study are a small left pleural effusion and small amount of free fluid in the upper abdomen. 4. New since the prior study are vague low-attenuation lesions in the liver possibly representing developing metastasis. 5. Unchanged appearance of 4.6 cm left ovarian cyst. Due to the stability of this lesion, no additional imaging follow-up is recommended. Note: This recommendation does not apply to premenarchal patients and to those with increased risk (genetic, family history, elevated tumor markers or other high-risk factors) of ovarian cancer. Reference: JACR 2020 Feb; 17(2):248-254 Electronically Signed   By: Oren Beckmann.D.  On: 01/03/2023 20:15    Labs:  CBC: Recent Labs    01/03/23 1550 01/03/23 2022 01/05/23 0109 01/06/23 0120  WBC 14.8* 14.6* 13.2* 11.8*  HGB 10.7* 9.6* 9.9* 8.9*  HCT 34.3* 29.9* 30.9* 28.4*  PLT 251 225 227 263    COAGS: Recent Labs    01/06/23 0120  INR 1.1    BMP: Recent Labs    12/09/22 0829 01/03/23 1550 01/03/23 2022 01/04/23 0308 01/05/23 0109  NA 142 129*  --  132* 132*  K 4.4 3.9  --  3.5 4.0  CL 101 95*  --  99 96*  CO2 29 21*  --  23 25  GLUCOSE 213*  391*  --  240* 407*  BUN 16 17  --  14 19  CALCIUM 10.0 8.6*  --  7.9* 8.2*  CREATININE 0.79 0.71 0.68 0.66 0.78  GFRNONAA >60 >60 >60 >60 >60    LIVER FUNCTION TESTS: Recent Labs    12/09/22 0829 01/03/23 1550 01/04/23 0308 01/05/23 0109  BILITOT 0.3 1.0 0.9 0.8  AST 20 166* 141* 77*  ALT 12 45* 35 60*  ALKPHOS 134* 249* 217* 242*  PROT 7.0 6.6 5.7* 5.6*  ALBUMIN 4.1 2.7* 2.3* 2.1*    Assessment and Plan: Metastatic breast cancer Anemia Plan made to proceed with bone marrow biopsy today for which patient has been set up.  Additional discussion today between Dr. Marin Olp and Dr. Anselm Pancoast for additional soft tissue component which is more likely to yield diagnostic results, as well as Port-A-Cath placement for initiation of treatment.  Dr. Marin Olp has discussed with the patient as well. She is in agreement to proceed with Port-A-Cath placement, soft tissue mass biopsy, as well as bone marrow biopsy.  She has been NPO.   Risks and benefits of image guided port-a-catheter placement was discussed with the patient including, but not limited to bleeding, infection, pneumothorax, or fibrin sheath development and need for additional procedures.  Risks and benefits of biopsy was discussed with the patient and/or patient's family including, but not limited to bleeding, infection, damage to adjacent structures or low yield requiring additional tests.  All of the questions were answered and there is agreement to proceed.  Consent signed and in chart.    Electronically Signed: Docia Barrier, PA 01/06/2023, 8:25 AM   I spent a total of 25 Minutes at the the patient's bedside AND on the patient's hospital floor or unit, greater than 50% of which was counseling/coordinating care for metastatic cancer.

## 2023-01-06 NOTE — Sedation Documentation (Signed)
Bone marrow biopsy completed in CT with Dr. Annamaria Boots. Transitioning patient to IR for [facility] and US biopsy with Dr. Anselm Pancoast

## 2023-01-06 NOTE — Progress Notes (Signed)
PROGRESS NOTE    Annette James  [id number] DOB: 03-Jan-1961 DOA: 01/03/2023 PCP: [name], MD  Outpatient Specialists: oncology    Brief Narrative:  Annette James is a 62 y.o. female with medical history significant of metastatic breast cancer who presented to the department due to uncontrolled pain in her joints upper back and shoulders.  She saw her oncologist who gave her steroid pack without improvement.  She takes fentanyl and oxycodone as an outpatient.  Due to uncontrolled pain she presented to the ER.  On arrival she was found to be tachycardic in the 120s and hemodynamically stable.  Labs were obtained which revealed sodium 129, bicarb 21, glucose 391, AST 166, ALT 45, alkaline phosphatase 249, WBC 14.8, hemoglobin 10.7, CT chest abdomen pelvis was obtained which demonstrated diffuse skeletal metastasis including new lesions in the sternum, and new pleural effusion and new liver mets.  Patient admitted for further management of intractable generalized pain.    Assessment & Plan:   Principal Problem:   Cancer-related breakthrough pain Active Problems:   Breast cancer metastasized to bone, unspecified laterality (HCC)   Class 1 obesity   Intractable malignancy-related skeletal pain CT of c/a/p shows diffuse skeletal metastasis MRI does show liver metastasis Patient with diffuse pain shoulders, arms, left leg Oncology consulted, recommend bone marrow biopsy, biopsy of sternal soft tissue lesion and port placement Status post bone marrow biopsy, biopsy of sternal soft tissue lesion and port placement on 3/27 Pain management Echo pending  Metastatic breast cancer Continue piqray Oncology on board Palliative consulted for further goals of care discussions  Elevated liver enzymes Likely 2/2 mets to the bone in addition to liver mets Daily CMP  Hyperglycemia Last A1c done on 08/2022 showed 5.1 No dx of dm, did get recent steroids SSI, holding home  glimepiride  Leukocytosis Likely 2/2 reactive, in addition to recent steroid use   DVT prophylaxis: lovenox Code Status: full Family Communication: none @ bedside  Level of care: Med-Surg Status is: Inpatient Remains inpatient appropriate because: severity of illness    Consultants:  Oncology, palliative IR  Procedures: Bone marrow, soft tissue biopsy, port placement on 3/27  Antimicrobials:  none    Subjective: Ongoing shoulder pain bilaterally, currently controlled on pain meds    Objective: Vitals:   01/06/23 1030 01/06/23 1045 01/06/23 1102 01/06/23 1656  BP: (!) 151/79 139/77 (!) 143/73 (!) 142/77  Pulse: (!) 104 (!) 101 100 98  Resp: 17 16 18 17   Temp:   (!) 97.5 F (36.4 C) 98.2 F (36.8 C)  TempSrc:   Oral Oral  SpO2: 99% 97% 98% 97%  Weight:      Height:        Intake/Output Summary (Last 24 hours) at 01/06/2023 2007 Last data filed at 01/06/2023 1900 Gross per 24 hour  Intake 240 ml  Output --  Net 240 ml   Filed Weights   01/03/23 1455  Weight: 72.1 kg    Examination: General: NAD  Cardiovascular: S1, S2 present Respiratory: CTAB Abdomen: Soft, nontender, nondistended, bowel sounds present Musculoskeletal: No bilateral pedal edema noted Skin: Normal Psychiatry: Normal mood     Data Reviewed: I have personally reviewed following labs and imaging studies  CBC: Recent Labs  Lab 01/03/23 1550 01/03/23 2022 01/05/23 0109 01/06/23 0120  WBC 14.8* 14.6* 13.2* 11.8*  NEUTROABS 11.8*  --  12.4* 9.4*  HGB 10.7* 9.6* 9.9* 8.9*  HCT 34.3* 29.9* 30.9* 28.4*  MCV 86.2 84.9 85.1 85.3  PLT 251 225 227 [id number]   Basic Metabolic Panel: Recent Labs  Lab 01/03/23 1550 01/03/23 2022 01/04/23 0308 01/05/23 0109  NA 129*  --  132* 132*  K 3.9  --  3.5 4.0  CL 95*  --  99 96*  CO2 21*  --  23 25  GLUCOSE 391*  --  240* 407*  BUN 17  --  14 19  CREATININE 0.71 0.68 0.66 0.78  CALCIUM 8.6*  --  7.9* 8.2*   GFR: Estimated Creatinine  Clearance: 76.4 mL/min (by C-G formula based on SCr of 0.78 mg/dL). Liver Function Tests: Recent Labs  Lab 01/03/23 1550 01/04/23 0308 01/05/23 0109  AST 166* 141* 77*  ALT 45* 35 60*  ALKPHOS 249* 217* 242*  BILITOT 1.0 0.9 0.8  PROT 6.6 5.7* 5.6*  ALBUMIN 2.7* 2.3* 2.1*   No results for input(s): "LIPASE", "AMYLASE" in the last 168 hours. No results for input(s): "AMMONIA" in the last 168 hours. Coagulation Profile: Recent Labs  Lab 01/06/23 0120  INR 1.1   Cardiac Enzymes: No results for input(s): "CKTOTAL", "CKMB", "CKMBINDEX", "TROPONINI" in the last 168 hours. BNP (last 3 results) No results for input(s): "PROBNP" in the last 8760 hours. HbA1C: No results for input(s): "HGBA1C" in the last 72 hours. CBG: Recent Labs  Lab 01/05/23 1815 01/05/23 2024 01/06/23 0735 01/06/23 1221 01/06/23 1657  GLUCAP 301* 286* 277* 223* 215*   Lipid Profile: No results for input(s): "CHOL", "HDL", "LDLCALC", "TRIG", "CHOLHDL", "LDLDIRECT" in the last 72 hours. Thyroid Function Tests: No results for input(s): "TSH", "T4TOTAL", "FREET4", "T3FREE", "THYROIDAB" in the last 72 hours. Anemia Panel: Recent Labs    01/05/23 0109  TIBC 174*  IRON 34  RETICCTPCT 2.0   Urine analysis:    Component Value Date/Time   COLORURINE AMBER (A) 10/18/2020 0819   APPEARANCEUR CLOUDY (A) 10/18/2020 0819   LABSPEC 1.028 10/18/2020 0819   PHURINE 5.0 10/18/2020 0819   GLUCOSEU NEGATIVE 10/18/2020 0819   HGBUR NEGATIVE 10/18/2020 0819   BILIRUBINUR NEGATIVE 10/18/2020 0819   KETONESUR 5 (A) 10/18/2020 0819   PROTEINUR 100 (A) 10/18/2020 0819   NITRITE NEGATIVE 10/18/2020 0819   LEUKOCYTESUR NEGATIVE 10/18/2020 0819   Sepsis Labs: @LABRCNTIP (procalcitonin:4,lacticidven:4)  )No results found for this or any previous visit (from the past 240 hour(s)).       Radiology Studies: IR IMAGING GUIDED PORT INSERTION  Result Date: 01/06/2023 INDICATION: 62 year old with history of stage IV  breast cancer with concern for recurrent or progressive disease. Patient needs tissue sampling and Port-A-Cath. EXAM: FLUOROSCOPIC AND ULTRASOUND GUIDED PLACEMENT OF A SUBCUTANEOUS PORT ULTRASOUND-GUIDED CORE BIOPSY OF STERNAL SOFT TISSUE LESION COMPARISON:  None Available. MEDICATIONS: Moderate sedation ANESTHESIA/SEDATION: Moderate (conscious) sedation was employed during this procedure. A total of Versed 2.5 mg and fentanyl 125 mcg was administered intravenously at the order of the provider performing the procedure. Total intra-service moderate sedation time: 41 minutes. Patient's level of consciousness and vital signs were monitored continuously by radiology nurse throughout the procedure under the supervision of the provider performing the procedure. FLUOROSCOPY TIME:  Radiation Exposure Index (as provided by the fluoroscopic device): 2 mGy Kerma COMPLICATIONS: None immediate. PROCEDURE: The procedure, risks, benefits, and alternatives were explained to the patient. Questions regarding the procedure were encouraged and answered. The patient understands and consents to the procedure. Patient was placed supine on the interventional table. Ultrasound confirmed a patent right internal jugular vein. Ultrasound image was saved for documentation. The chest and neck were cleaned with  a skin antiseptic and a sterile drape was placed. Maximal barrier sterile technique was utilized including caps, mask, sterile gowns, sterile gloves, sterile drape, hand hygiene and skin antiseptic. Ultrasound demonstrated the soft tissue lesion surrounding the sternum. The skin was anesthetized with 1% lidocaine. A small incision was made. Using ultrasound guidance, an 18 gauge core biopsy device was directed into the lesion just anterior to the sternum. Multiple core biopsies were obtained and specimens were placed in formalin. Needle was removed and a bandage was placed over the puncture site. Gloves, needles and ultrasound probe cover  that were used for the biopsy were discarded and replaced for the port placement. The right neck was anesthetized with 1% lidocaine. Small incision was made in the right neck with a blade. Micropuncture set was placed in the right internal jugular vein with ultrasound guidance. The micropuncture wire was used for measurement purposes. The right chest was anesthetized with 1% lidocaine with epinephrine. #15 blade was used to make an incision and a subcutaneous port pocket was formed. [place] was assembled. Subcutaneous tunnel was formed with a stiff tunneling device. The port catheter was brought through the subcutaneous tunnel. The port was placed in the subcutaneous pocket. The micropuncture set was exchanged for a peel-away sheath. The catheter was placed through the peel-away sheath and the tip was positioned at the superior cavoatrial junction. Catheter placement was confirmed with fluoroscopy. The port was accessed and flushed with saline. The port pocket was closed using two layers of absorbable sutures and Dermabond. The vein skin site was closed using a single layer of absorbable suture and Dermabond. Sterile dressings were applied. Patient tolerated the procedure well without an immediate complication. Ultrasound and fluoroscopic images were taken and saved for this procedure. FINDINGS: Hypoechoic soft tissue surrounding the anterior aspect of the sternum. Core biopsy needle was confirmed within the lesion. Multiple core samples were obtained and placed in formalin. Port-A-Cath tip is at the superior cavoatrial junction. IMPRESSION: Placement of a subcutaneous power-injectable port device. Catheter tip at the superior cavoatrial junction. Port is accessed and ready to be used. Ultrasound-guided core biopsy of the sternal soft tissue lesion. Electronically Signed   By: Markus Daft M.D.   On: 01/06/2023 12:42   IR US Guide Bx Asp/Drain  Result Date: 01/06/2023 INDICATION: 62 year old with  history of stage IV breast cancer with concern for recurrent or progressive disease. Patient needs tissue sampling and Port-A-Cath. EXAM: FLUOROSCOPIC AND ULTRASOUND GUIDED PLACEMENT OF A SUBCUTANEOUS PORT ULTRASOUND-GUIDED CORE BIOPSY OF STERNAL SOFT TISSUE LESION COMPARISON:  None Available. MEDICATIONS: Moderate sedation ANESTHESIA/SEDATION: Moderate (conscious) sedation was employed during this procedure. A total of Versed 2.5 mg and fentanyl 125 mcg was administered intravenously at the order of the provider performing the procedure. Total intra-service moderate sedation time: 41 minutes. Patient's level of consciousness and vital signs were monitored continuously by radiology nurse throughout the procedure under the supervision of the provider performing the procedure. FLUOROSCOPY TIME:  Radiation Exposure Index (as provided by the fluoroscopic device): 2 mGy Kerma COMPLICATIONS: None immediate. PROCEDURE: The procedure, risks, benefits, and alternatives were explained to the patient. Questions regarding the procedure were encouraged and answered. The patient understands and consents to the procedure. Patient was placed supine on the interventional table. Ultrasound confirmed a patent right internal jugular vein. Ultrasound image was saved for documentation. The chest and neck were cleaned with a skin antiseptic and a sterile drape was placed. Maximal barrier sterile technique was utilized including caps, mask,  sterile gowns, sterile gloves, sterile drape, hand hygiene and skin antiseptic. Ultrasound demonstrated the soft tissue lesion surrounding the sternum. The skin was anesthetized with 1% lidocaine. A small incision was made. Using ultrasound guidance, an 18 gauge core biopsy device was directed into the lesion just anterior to the sternum. Multiple core biopsies were obtained and specimens were placed in formalin. Needle was removed and a bandage was placed over the puncture site. Gloves, needles and  ultrasound probe cover that were used for the biopsy were discarded and replaced for the port placement. The right neck was anesthetized with 1% lidocaine. Small incision was made in the right neck with a blade. Micropuncture set was placed in the right internal jugular vein with ultrasound guidance. The micropuncture wire was used for measurement purposes. The right chest was anesthetized with 1% lidocaine with epinephrine. #15 blade was used to make an incision and a subcutaneous port pocket was formed. [place] was assembled. Subcutaneous tunnel was formed with a stiff tunneling device. The port catheter was brought through the subcutaneous tunnel. The port was placed in the subcutaneous pocket. The micropuncture set was exchanged for a peel-away sheath. The catheter was placed through the peel-away sheath and the tip was positioned at the superior cavoatrial junction. Catheter placement was confirmed with fluoroscopy. The port was accessed and flushed with saline. The port pocket was closed using two layers of absorbable sutures and Dermabond. The vein skin site was closed using a single layer of absorbable suture and Dermabond. Sterile dressings were applied. Patient tolerated the procedure well without an immediate complication. Ultrasound and fluoroscopic images were taken and saved for this procedure. FINDINGS: Hypoechoic soft tissue surrounding the anterior aspect of the sternum. Core biopsy needle was confirmed within the lesion. Multiple core samples were obtained and placed in formalin. Port-A-Cath tip is at the superior cavoatrial junction. IMPRESSION: Placement of a subcutaneous power-injectable port device. Catheter tip at the superior cavoatrial junction. Port is accessed and ready to be used. Ultrasound-guided core biopsy of the sternal soft tissue lesion. Electronically Signed   By: Markus Daft M.D.   On: 01/06/2023 12:42   CT BONE MARROW BIOPSY & ASPIRATION  Result Date:  01/06/2023 INDICATION: Sclerotic metastatic bone disease EXAM: CT GUIDED RIGHT ILIAC BONE MARROW ASPIRATION AND CORE BIOPSY Date:  01/06/2023 01/06/2023 9:32 am Radiologist:  M. Daryll Brod, MD Guidance:  CT FLUOROSCOPY: Fluoroscopy Time: None. MEDICATIONS: 1% lidocaine local ANESTHESIA/SEDATION: 1.5 mg IV Versed; 75 mcg IV Fentanyl Moderate Sedation Time:  13 minute The patient was continuously monitored during the procedure by the interventional radiology nurse under my direct supervision. CONTRAST:  None. COMPLICATIONS: None PROCEDURE: Informed consent was obtained from the patient following explanation of the procedure, risks, benefits and alternatives. The patient understands, agrees and consents for the procedure. All questions were addressed. A time out was performed. The patient was positioned prone and non-contrast localization CT was performed of the pelvis to demonstrate the iliac marrow spaces. Maximal barrier sterile technique utilized including caps, mask, sterile gowns, sterile gloves, large sterile drape, hand hygiene, and Betadine prep. Under sterile conditions and local anesthesia, an 11 gauge coaxial bone biopsy needle was advanced into the right iliac marrow space. Needle position was confirmed with CT imaging. Initially, bone marrow aspiration was attempted but yielded a dry tap, as expected. Next, the 11 gauge outer cannula was utilized to obtain 2 right iliac bone marrow core biopsy. Needle was removed. Hemostasis was obtained with compression. The patient tolerated  the procedure well. Samples were prepared with the cytotechnologist. No immediate complications. IMPRESSION: CT guided right iliac bone marrow core biopsy. Electronically Signed   By: Jerilynn Mages.  Shick M.D.   On: 01/06/2023 10:02   NM Bone Scan Whole Body  Result Date: 01/05/2023 CLINICAL DATA:  Metastatic breast cancer. Assess treatment response. EXAM: NUCLEAR MEDICINE WHOLE BODY BONE SCAN TECHNIQUE: Whole body anterior and posterior  images were obtained approximately 3 hours after intravenous injection of radiopharmaceutical. RADIOPHARMACEUTICALS:  21.8. mCi Technetium-25m MDP IV COMPARISON:  CT chest, abdomen and pelvis from 01/03/2023 and PET-CT from 06/08/2022. FINDINGS: Multifocal abnormal areas of increased radiotracer uptake are identified throughout the axial and appendicular skeleton compatible with diffuse osseous metastasis. Increased radiotracer uptake is identified within the bilateral frontal bones, bilateral posterior ribs, sternum, and midthoracic spine. Increased uptake within the L4-5 vertebra also noted. There is also increased radiotracer uptake within both sides of the sacrum, and bilateral anterior iliac bones. Tracer uptake within the humeral heads is also noted bilaterally. Compared with the FDG PET from 06/08/2022 the extent, and distribution of bone metastasis appears similar. Normal physiologic tracer uptake seen within the kidneys an urinary bladder. IMPRESSION: 1. Compared with the FDG PET from 06/08/2022 there is a similar appearance of widespread osseous metastasis involving the axial and appendicular skeleton. Electronically Signed   By: Kerby Moors M.D.   On: 01/05/2023 15:26   MR LIVER W WO CONTRAST  Result Date: 01/05/2023 CLINICAL DATA:  History of breast cancer. Evaluate for liver metastases. Elevated LFTs. EXAM: MRI ABDOMEN WITHOUT AND WITH CONTRAST TECHNIQUE: Multiplanar multisequence MR imaging of the abdomen was performed both before and after the administration of intravenous contrast. CONTRAST:  51mL GADAVIST GADOBUTROL 1 MMOL/ML IV SOLN COMPARISON:  CT chest, abdomen and pelvis from 01/03/2023 and PET-CT from 09/11/2022 FINDINGS: Lower chest: Small left pleural effusion identified. Hepatobiliary: Numerous T2 hyperintense and T1 hypointense lesions are identified throughout the liver. These exhibit typical rim enhancement seen with liver metastases. These lesions appear new when compared with  previous imaging prior to 09/11/2022. Index lesions include: -lesion in segment 4 B measures 1.4 x 1.2 cm, image 67/1002 -Index lesion within segment 7 measures 1.4 x 0.9 cm, image 39/1002. -lesion in segment 6 measures 0.7 x 0.7 cm, image 95/1002 The gallbladder appears surgically absent. There is increase caliber of the common bile duct which measures up to 9 mm. No choledocholithiasis identified. Pancreas: No signs of pancreatic inflammation or mass. Mild increase caliber of the main pancreatic duct measures upper limits of normal at 3 mm, image 23/3. Spleen:  Within normal limits in size and appearance. Adrenals/Urinary Tract: Normal adrenal glands. No nephrolithiasis, hydronephrosis or suspicious mass. Stomach/Bowel: Stomach appears within normal limits. No dilated bowel loops identified. No bowel wall thickening or inflammation noted. Vascular/Lymphatic: Normal caliber of the abdominal aorta. No abdominal adenopathy. Other:  No ascites or focal fluid collections. Musculoskeletal: Mild diffuse body wall edema suggesting anasarca. The bone marrow is diffusely abnormal compatible with diffuse osseous metastasis. IMPRESSION: 1. Numerous liver metastases are identified which appear new when compared with previous imaging prior to 09/11/2022 2. Diffuse osseous metastasis. 3. Mild increase caliber of the common bile duct and main pancreatic duct. No choledocholithiasis or obstructing mass identified. 4. Small left pleural effusion. 5. Mild diffuse body wall edema suggesting anasarca. Electronically Signed   By: Kerby Moors M.D.   On: 01/05/2023 09:41        Scheduled Meds:  enoxaparin (LOVENOX) injection  40 mg Subcutaneous  Q24H   fentaNYL  1 patch Transdermal Q72H   heparin lock flush       insulin aspart  0-20 Units Subcutaneous TID WC   insulin aspart  0-5 Units Subcutaneous QHS   insulin aspart  3 Units Subcutaneous TID WC   lidocaine  10 mL Intradermal Once   lidocaine       polyethylene glycol   34 g Oral Daily   senna  1 tablet Oral BID   Continuous Infusions:  ondansetron (ZOFRAN) IV       LOS: 3 days     Alma Friendly, MD [facility]   If 7PM-7AM, please contact night-coverage [email or web address] 01/06/2023, 8:07 PM

## 2023-01-06 NOTE — Progress Notes (Signed)
Ms. Ottenbacher still has little bit of pain.  This is in her shoulders.  She did have the MRI yesterday.  This did show that she does have liver metastasis.  We really need to get a biopsy of 1 of these lesions.  I really need to see what her HER2 status is.  We also need to do molecular analysis.  In anticipation of chemotherapy, she will need to have a Port-A-Cath placed.  She also needs to have an echocardiogram done.  She, surprisingly, had stability of her CA 27.29.  When we checked it a day or so ago it was 144.  She is eating okay.  She does not have any problems with bowels or bladder.  I thank Palliative Care for seeing her.  Her labs show white cell count of 11.8.  Hemoglobin 8.9.  Blood count 2 and 63,000.  There is no metabolic panel back yet.  I would suspect that her blood sugar is up on the higher side.  Her vital signs are temperature 97.7.  Pulse 87.  Blood pressure 126/75.  Overall, there is no change in physical exam.  Her lungs are clear.  She has good air movement bilaterally.  Cardiac exam regular rate and rhythm she has occasional extra beat.  Abdomen is soft.  She has good bowel sounds.  There is no fluid wave.  Extremity shows no clubbing, cyanosis or edema.  Skin exam shows no rashes.  Again, the pain is little bit of a problem today.  I will have to see if she is still on Toradol.  I think this is helpful for the pain because it is a good anti-inflammatory.  She needs to have a biopsy.  She is to have the bone marrow biopsy done.  She does have a Port-A-Cath placed.  She needs to have an echocardiogram.  We really need to get all the stuff done while she is in the hospital.  I do appreciate the great care she is getting from the staff [facility] [address].  They are so compassionate!!!!   Lattie Haw, MD  [place] 7:7-8

## 2023-01-06 NOTE — Plan of Care (Signed)
  Problem: Education: Goal: Knowledge of General Education information will improve Description: Including pain rating scale, medication(s)/side effects and non-pharmacologic comfort measures Outcome: Progressing   Problem: Health Behavior/Discharge Planning: Goal: Ability to manage health-related needs will improve Outcome: Progressing   Problem: Clinical Measurements: Goal: Ability to maintain clinical measurements within normal limits will improve Outcome: Progressing Goal: Will remain free from infection Outcome: Progressing   Problem: Nutrition: Goal: Adequate nutrition will be maintained Outcome: Progressing   Problem: Elimination: Goal: Will not experience complications related to bowel motility Outcome: Progressing   Problem: Pain Managment: Goal: General experience of comfort will improve Outcome: Progressing   Problem: Safety: Goal: Ability to remain free from injury will improve Outcome: Progressing   Problem: Education: Goal: Ability to describe self-care measures that may prevent or decrease complications (Diabetes Survival Skills Education) will improve Outcome: Progressing

## 2023-01-06 NOTE — Procedures (Signed)
Interventional Radiology Procedure:   Indications: History of breast cancer with new lesions, including liver and sternum  Procedure: US guided core biopsy of sternal soft tissue lesion and port placement  Findings: 18 gauge core biopsies obtained from soft tissue lesion around sternum.  Right jugular power injectable port placed, tip at SVC/RA junction.   Complications: None     EBL: Minimal  Plan: Port is accessed and ready to use.   Annette James R. Anselm Pancoast, MD  Pager: [phone]

## 2023-01-06 NOTE — Procedures (Signed)
Interventional Radiology Procedure Note  Procedure: CT BM ASP AND CORE    Complications: None  Estimated Blood Loss:  MIN  Findings: 11 G CORE X 2 DRY TAP AS EXPECTED    Tamera Punt, MD

## 2023-01-07 ENCOUNTER — Inpatient Hospital Stay (HOSPITAL_COMMUNITY): Payer: BC Managed Care – PPO

## 2023-01-07 DIAGNOSIS — Z0189 Encounter for other specified special examinations: Secondary | ICD-10-CM

## 2023-01-07 DIAGNOSIS — G893 Neoplasm related pain (acute) (chronic): Secondary | ICD-10-CM | POA: Diagnosis not present

## 2023-01-07 LAB — COMPREHENSIVE METABOLIC PANEL
ALT: 34 U/L (ref 0–44)
AST: 58 U/L — ABNORMAL HIGH (ref 15–41)
Albumin: 2.2 g/dL — ABNORMAL LOW (ref 3.5–5.0)
Alkaline Phosphatase: 202 U/L — ABNORMAL HIGH (ref 38–126)
Anion gap: 10 (ref 5–15)
BUN: 23 mg/dL — ABNORMAL HIGH (ref 6–20)
CO2: 25 mmol/L (ref 22–32)
Calcium: 7.9 mg/dL — ABNORMAL LOW (ref 8.9–10.3)
Chloride: 99 mmol/L (ref 98–111)
Creatinine, Ser: 0.66 mg/dL (ref 0.44–1.00)
GFR, Estimated: >60 mL/min (ref >60)
Glucose, Bld: 152 mg/dL — ABNORMAL HIGH (ref 70–99)
Potassium: 3.2 mmol/L — ABNORMAL LOW (ref 3.5–5.1)
Sodium: 134 mmol/L — ABNORMAL LOW (ref 135–145)
Total Bilirubin: 1 mg/dL (ref 0.3–1.2)
Total Protein: 5.3 g/dL — ABNORMAL LOW (ref 6.5–8.1)

## 2023-01-07 LAB — PREPARE RBC (CROSSMATCH)

## 2023-01-07 LAB — CBC WITH DIFFERENTIAL/PLATELET
Abs Immature Granulocytes: 0.98 10*3/uL — ABNORMAL HIGH (ref 0.00–0.07)
Basophils Absolute: 0 10*3/uL (ref 0.0–0.1)
Basophils Relative: 0 %
Eosinophils Absolute: 0 10*3/uL (ref 0.0–0.5)
Eosinophils Relative: 0 %
HCT: 26.8 % — ABNORMAL LOW (ref 36.0–46.0)
Hemoglobin: 8.3 g/dL — ABNORMAL LOW (ref 12.0–15.0)
Immature Granulocytes: 9 %
Lymphocytes Relative: 3 %
Lymphs Abs: 0.3 10*3/uL — ABNORMAL LOW (ref 0.7–4.0)
MCH: 26.5 pg (ref 26.0–34.0)
MCHC: 31 g/dL (ref 30.0–36.0)
MCV: 85.6 fL (ref 80.0–100.0)
Monocytes Absolute: 1.1 10*3/uL — ABNORMAL HIGH (ref 0.1–1.0)
Monocytes Relative: 11 %
Neutro Abs: 8 10*3/uL — ABNORMAL HIGH (ref 1.7–7.7)
Neutrophils Relative %: 77 %
Platelets: 236 10*3/uL (ref 150–400)
RBC: 3.13 MIL/uL — ABNORMAL LOW (ref 3.87–5.11)
RDW: 18 % — ABNORMAL HIGH (ref 11.5–15.5)
WBC: 10.5 10*3/uL (ref 4.0–10.5)
nRBC: 2.2 % — ABNORMAL HIGH (ref 0.0–0.2)

## 2023-01-07 LAB — GLUCOSE, CAPILLARY
Glucose-Capillary: 246 mg/dL — ABNORMAL HIGH (ref 70–99)
Glucose-Capillary: 135 mg/dL — ABNORMAL HIGH (ref 70–99)
Glucose-Capillary: 115 mg/dL — ABNORMAL HIGH (ref 70–99)
Glucose-Capillary: 159 mg/dL — ABNORMAL HIGH (ref 70–99)

## 2023-01-07 LAB — ECHOCARDIOGRAM COMPLETE
Height: 68 in
MV VTI: 5.25 cm2
S' Lateral: 2.7 cm
Weight: 2544 oz

## 2023-01-07 LAB — HEMOGLOBIN AND HEMATOCRIT, BLOOD
HCT: 30.4 % — ABNORMAL LOW (ref 36.0–46.0)
Hemoglobin: 10.1 g/dL — ABNORMAL LOW (ref 12.0–15.0)

## 2023-01-07 MED ORDER — SODIUM CHLORIDE 0.9% IV SOLUTION: Freq: Once | INTRAVENOUS | Status: DC

## 2023-01-07 MED ORDER — KETOROLAC TROMETHAMINE 30 MG/ML IJ SOLN
30.0000 mg | Freq: Three times a day (TID) | INTRAMUSCULAR | Status: DC
  Administered 2023-01-07 – 2023-01-08 (×3): 30 mg via INTRAVENOUS
  Filled 2023-01-07 (×3): qty 1

## 2023-01-07 MED ORDER — INSULIN GLARGINE-YFGN 100 UNIT/ML ~~LOC~~ SOLN
5.0000 [IU] | Freq: Every day | SUBCUTANEOUS | Status: DC
  Administered 2023-01-07: 5 [IU] via SUBCUTANEOUS
  Filled 2023-01-07 (×2): qty 0.05

## 2023-01-07 MED ORDER — POTASSIUM CHLORIDE CRYS ER 20 MEQ PO TBCR
40.0000 meq | EXTENDED_RELEASE_TABLET | Freq: Once | ORAL | Status: AC
  Administered 2023-01-07: 40 meq via ORAL
  Filled 2023-01-07: qty 2

## 2023-01-07 NOTE — Progress Notes (Signed)
Prn nausea med given to pt per request

## 2023-01-07 NOTE — Progress Notes (Signed)
H&H order placed after blood transfusion completion.

## 2023-01-07 NOTE — Progress Notes (Signed)
   01/07/23 0838  Assess: MEWS Score  Temp 98.3 F (36.8 C)  BP (!) 154/76  MAP (mmHg) 98  Pulse Rate (!) 112  Resp 17  Level of Consciousness Alert  SpO2 98 %  O2 Device Room Air  Assess: MEWS Score  MEWS Temp 0  MEWS Systolic 0  MEWS Pulse 2  MEWS RR 0  MEWS LOC 0  MEWS Score 2  MEWS Score Color Yellow  Assess: if the MEWS score is Yellow or Red  Were vital signs taken at a resting state? Yes  Focused Assessment No change from prior assessment  Does the patient meet 2 or more of the SIRS criteria? Yes  Does the patient have a confirmed or suspected source of infection? No  Provider and Rapid Response Notified? Yes  MEWS guidelines implemented  Yes, yellow  Treat  MEWS Interventions Considered administering scheduled or prn medications/treatments as ordered  Take Vital Signs  Increase Vital Sign Frequency  Yellow: Q2hr x1, continue Q4hrs until patient remains green for 12hrs  Escalate  MEWS: Escalate Yellow: Discuss with charge nurse and consider notifying provider and/or RRT  Notify: Charge Nurse/RN  Name of Charge Nurse/RN Notified [place]  Provider Notification  Provider Name/Title Ezenduka,MD  Date Provider Notified 01/07/23  Time Provider Notified [phone]  Method of Notification Page  Notification Reason Other (Comment) (Yellow MEWS)  Provider response No new orders  Date of Provider Response 01/07/23  Assess: SIRS CRITERIA  SIRS Temperature  0  SIRS Pulse 1  SIRS Respirations  0  SIRS WBC 0  SIRS Score Sum  1

## 2023-01-07 NOTE — Inpatient Diabetes Management (Signed)
Inpatient Diabetes Program Recommendations  AACE/ADA: New Consensus Statement on Inpatient Glycemic Control (2015)  Target Ranges:  Prepandial:   less than 140 mg/dL      Peak postprandial:   less than 180 mg/dL (1-2 hours)      Critically ill patients:  140 - 180 mg/dL    Latest Reference Range & Units 01/06/23 07:35 01/06/23 12:21 01/06/23 16:57 01/06/23 20:48  Glucose-Capillary 70 - 99 mg/dL 277 (H)  11 units Novolog  223 (H)  7 units Novolog  215 (H)  10 units Novolog @1839  179 (H)  4 units Novolog   (H): Data is abnormally high  Latest Reference Range & Units 01/07/23 08:38  Glucose-Capillary 70 - 99 mg/dL 246 (H)  10 units Novolog   (H): Data is abnormally high     Home DM Meds: Amaryl 2 mg Daily with breakfast    Current Orders: Novolog Resistant Correction Scale/ SSI (0-20 units) TID AC + HS      Novolog 3 units TID with meals     MD- Note AM CBGs elevated.  Note that Novolog Meal Coverage started last PM with Dinner.  Please consider starting Basal Insulin:  Semglee 7 units QHS (0.1 units/kg)      --Will follow patient during hospitalization--  Wyn Quaker RN, MSN, [place] Diabetes Coordinator Inpatient Glycemic Control Team Team Pager: [phone] (8a-5p)

## 2023-01-07 NOTE — Progress Notes (Signed)
  Echocardiogram 2D Echocardiogram has been performed.  Annette James 01/07/2023, 2:06 PM

## 2023-01-07 NOTE — Progress Notes (Signed)
PROGRESS NOTE    Annette James  [id number] DOB: Jan 16, 1961 DOA: 01/03/2023 PCP: [name], MD  Outpatient Specialists: oncology    Brief Narrative:  Annette James is a 63 y.o. female with medical history significant of metastatic breast cancer who presented to the department due to uncontrolled pain in her joints upper back and shoulders.  She saw her oncologist who gave her steroid pack without improvement.  She takes fentanyl and oxycodone as an outpatient.  Due to uncontrolled pain she presented to the ER.  On arrival she was found to be tachycardic in the 120s and hemodynamically stable.  Labs were obtained which revealed sodium 129, bicarb 21, glucose 391, AST 166, ALT 45, alkaline phosphatase 249, WBC 14.8, hemoglobin 10.7, CT chest abdomen pelvis was obtained which demonstrated diffuse skeletal metastasis including new lesions in the sternum, and new pleural effusion and new liver mets.  Patient admitted for further management of intractable generalized pain.    Assessment & Plan:   Principal Problem:   Cancer-related breakthrough pain Active Problems:   Breast cancer metastasized to bone, unspecified laterality (HCC)   Class 1 obesity   Intractable malignancy-related skeletal pain Liver metastasis  CT of c/a/p shows diffuse skeletal metastasis MRI does show liver metastasis Patient with diffuse pain shoulders, arms, left leg Oncology con board Status post bone marrow biopsy, biopsy of sternal soft tissue lesion and port placement on 3/27 Pain management Echo pending  Metastatic breast cancer Oncology on board Palliative consulted for further goals of care discussions  Elevated liver enzymes Likely 2/2 mets to the bone in addition to liver mets Daily CMP  Hypokalemia Replace prn  Normocytic anemia Anemia of chronic disease S/p 1U of PRBC on 3/28 as hgb drifting dowards (also as per Dr Antonieta Pert request) Daily CBC  Hyperglycemia Last A1c  done on 08/2022 showed 5.1 No dx of dm, did get recent steroids SSI, holding home glimepiride, novolog TID, started semglee  Leukocytosis Likely 2/2 reactive, in addition to recent steroid use   DVT prophylaxis: lovenox Code Status: full Family Communication: none @ bedside  Level of care: Med-Surg Status is: Inpatient Remains inpatient appropriate because: severity of illness    Consultants:  Oncology, palliative IR  Procedures: Bone marrow, soft tissue biopsy, port placement on 3/27  Antimicrobials:  none    Subjective: C/O pain around site of recent port placement    Objective: Vitals:   01/07/23 0634 01/07/23 0838 01/07/23 1402 01/07/23 1417  BP: (!) 156/76 (!) 154/76 (!) 142/78 128/77  Pulse: (!) 111 (!) 112 (!) 105 100  Resp: 18 17 18 19   Temp: 98.5 F (36.9 C) 98.3 F (36.8 C) 99 F (37.2 C) 98.6 F (37 C)  TempSrc: Oral Oral Oral   SpO2: 97% 98% 100%   Weight:      Height:        Intake/Output Summary (Last 24 hours) at 01/07/2023 1506 Last data filed at 01/07/2023 1300 Gross per 24 hour  Intake 1440 ml  Output --  Net 1440 ml   Filed Weights   01/03/23 1455  Weight: 72.1 kg    Examination: General: NAD  Cardiovascular: S1, S2 present Respiratory: CTAB Abdomen: Soft, nontender, nondistended, bowel sounds present Musculoskeletal: No bilateral pedal edema noted Skin: Normal Psychiatry: Normal mood     Data Reviewed: I have personally reviewed following labs and imaging studies  CBC: Recent Labs  Lab 01/03/23 1550 01/03/23 2022 01/05/23 0109 01/06/23 0120 01/07/23 0303  WBC 14.8*  14.6* 13.2* 11.8* 10.5  NEUTROABS 11.8*  --  12.4* 9.4* 8.0*  HGB 10.7* 9.6* 9.9* 8.9* 8.3*  HCT 34.3* 29.9* 30.9* 28.4* 26.8*  MCV 86.2 84.9 85.1 85.3 85.6  PLT 251 225 227 263 [id number]   Basic Metabolic Panel: Recent Labs  Lab 01/03/23 1550 01/03/23 2022 01/04/23 0308 01/05/23 0109 01/07/23 0303  NA 129*  --  132* 132* 134*  K 3.9  --  3.5  4.0 3.2*  CL 95*  --  99 96* 99  CO2 21*  --  23 25 25   GLUCOSE 391*  --  240* 407* 152*  BUN 17  --  14 19 23*  CREATININE 0.71 0.68 0.66 0.78 0.66  CALCIUM 8.6*  --  7.9* 8.2* 7.9*   GFR: Estimated Creatinine Clearance: 76.4 mL/min (by C-G formula based on SCr of 0.66 mg/dL). Liver Function Tests: Recent Labs  Lab 01/03/23 1550 01/04/23 0308 01/05/23 0109 01/07/23 0303  AST 166* 141* 77* 58*  ALT 45* 35 60* 34  ALKPHOS 249* 217* 242* 202*  BILITOT 1.0 0.9 0.8 1.0  PROT 6.6 5.7* 5.6* 5.3*  ALBUMIN 2.7* 2.3* 2.1* 2.2*   No results for input(s): "LIPASE", "AMYLASE" in the last 168 hours. No results for input(s): "AMMONIA" in the last 168 hours. Coagulation Profile: Recent Labs  Lab 01/06/23 0120  INR 1.1   Cardiac Enzymes: No results for input(s): "CKTOTAL", "CKMB", "CKMBINDEX", "TROPONINI" in the last 168 hours. BNP (last 3 results) No results for input(s): "PROBNP" in the last 8760 hours. HbA1C: No results for input(s): "HGBA1C" in the last 72 hours. CBG: Recent Labs  Lab 01/06/23 1221 01/06/23 1657 01/06/23 2048 01/07/23 0838 01/07/23 1159  GLUCAP 223* 215* 179* 246* 135*   Lipid Profile: No results for input(s): "CHOL", "HDL", "LDLCALC", "TRIG", "CHOLHDL", "LDLDIRECT" in the last 72 hours. Thyroid Function Tests: No results for input(s): "TSH", "T4TOTAL", "FREET4", "T3FREE", "THYROIDAB" in the last 72 hours. Anemia Panel: Recent Labs    01/05/23 0109  TIBC 174*  IRON 34  RETICCTPCT 2.0   Urine analysis:    Component Value Date/Time   COLORURINE AMBER (A) 10/18/2020 0819   APPEARANCEUR CLOUDY (A) 10/18/2020 0819   LABSPEC 1.028 10/18/2020 0819   PHURINE 5.0 10/18/2020 0819   GLUCOSEU NEGATIVE 10/18/2020 0819   HGBUR NEGATIVE 10/18/2020 0819   BILIRUBINUR NEGATIVE 10/18/2020 0819   KETONESUR 5 (A) 10/18/2020 0819   PROTEINUR 100 (A) 10/18/2020 0819   NITRITE NEGATIVE 10/18/2020 0819   LEUKOCYTESUR NEGATIVE 10/18/2020 0819   Sepsis  Labs: @LABRCNTIP (procalcitonin:4,lacticidven:4)  )No results found for this or any previous visit (from the past 240 hour(s)).       Radiology Studies: IR IMAGING GUIDED PORT INSERTION  Result Date: 01/06/2023 INDICATION: 62 year old with history of stage IV breast cancer with concern for recurrent or progressive disease. Patient needs tissue sampling and Port-A-Cath. EXAM: FLUOROSCOPIC AND ULTRASOUND GUIDED PLACEMENT OF A SUBCUTANEOUS PORT ULTRASOUND-GUIDED CORE BIOPSY OF STERNAL SOFT TISSUE LESION COMPARISON:  None Available. MEDICATIONS: Moderate sedation ANESTHESIA/SEDATION: Moderate (conscious) sedation was employed during this procedure. A total of Versed 2.5 mg and fentanyl 125 mcg was administered intravenously at the order of the provider performing the procedure. Total intra-service moderate sedation time: 41 minutes. Patient's level of consciousness and vital signs were monitored continuously by radiology nurse throughout the procedure under the supervision of the provider performing the procedure. FLUOROSCOPY TIME:  Radiation Exposure Index (as provided by the fluoroscopic device): 2 mGy Kerma COMPLICATIONS: None immediate. PROCEDURE: The procedure,  risks, benefits, and alternatives were explained to the patient. Questions regarding the procedure were encouraged and answered. The patient understands and consents to the procedure. Patient was placed supine on the interventional table. Ultrasound confirmed a patent right internal jugular vein. Ultrasound image was saved for documentation. The chest and neck were cleaned with a skin antiseptic and a sterile drape was placed. Maximal barrier sterile technique was utilized including caps, mask, sterile gowns, sterile gloves, sterile drape, hand hygiene and skin antiseptic. Ultrasound demonstrated the soft tissue lesion surrounding the sternum. The skin was anesthetized with 1% lidocaine. A small incision was made. Using ultrasound guidance, an 18  gauge core biopsy device was directed into the lesion just anterior to the sternum. Multiple core biopsies were obtained and specimens were placed in formalin. Needle was removed and a bandage was placed over the puncture site. Gloves, needles and ultrasound probe cover that were used for the biopsy were discarded and replaced for the port placement. The right neck was anesthetized with 1% lidocaine. Small incision was made in the right neck with a blade. Micropuncture set was placed in the right internal jugular vein with ultrasound guidance. The micropuncture wire was used for measurement purposes. The right chest was anesthetized with 1% lidocaine with epinephrine. #15 blade was used to make an incision and a subcutaneous port pocket was formed. [place] was assembled. Subcutaneous tunnel was formed with a stiff tunneling device. The port catheter was brought through the subcutaneous tunnel. The port was placed in the subcutaneous pocket. The micropuncture set was exchanged for a peel-away sheath. The catheter was placed through the peel-away sheath and the tip was positioned at the superior cavoatrial junction. Catheter placement was confirmed with fluoroscopy. The port was accessed and flushed with saline. The port pocket was closed using two layers of absorbable sutures and Dermabond. The vein skin site was closed using a single layer of absorbable suture and Dermabond. Sterile dressings were applied. Patient tolerated the procedure well without an immediate complication. Ultrasound and fluoroscopic images were taken and saved for this procedure. FINDINGS: Hypoechoic soft tissue surrounding the anterior aspect of the sternum. Core biopsy needle was confirmed within the lesion. Multiple core samples were obtained and placed in formalin. Port-A-Cath tip is at the superior cavoatrial junction. IMPRESSION: Placement of a subcutaneous power-injectable port device. Catheter tip at the superior cavoatrial  junction. Port is accessed and ready to be used. Ultrasound-guided core biopsy of the sternal soft tissue lesion. Electronically Signed   By: Markus Daft M.D.   On: 01/06/2023 12:42   IR US Guide Bx Asp/Drain  Result Date: 01/06/2023 INDICATION: 62 year old with history of stage IV breast cancer with concern for recurrent or progressive disease. Patient needs tissue sampling and Port-A-Cath. EXAM: FLUOROSCOPIC AND ULTRASOUND GUIDED PLACEMENT OF A SUBCUTANEOUS PORT ULTRASOUND-GUIDED CORE BIOPSY OF STERNAL SOFT TISSUE LESION COMPARISON:  None Available. MEDICATIONS: Moderate sedation ANESTHESIA/SEDATION: Moderate (conscious) sedation was employed during this procedure. A total of Versed 2.5 mg and fentanyl 125 mcg was administered intravenously at the order of the provider performing the procedure. Total intra-service moderate sedation time: 41 minutes. Patient's level of consciousness and vital signs were monitored continuously by radiology nurse throughout the procedure under the supervision of the provider performing the procedure. FLUOROSCOPY TIME:  Radiation Exposure Index (as provided by the fluoroscopic device): 2 mGy Kerma COMPLICATIONS: None immediate. PROCEDURE: The procedure, risks, benefits, and alternatives were explained to the patient. Questions regarding the procedure were encouraged and answered. The  patient understands and consents to the procedure. Patient was placed supine on the interventional table. Ultrasound confirmed a patent right internal jugular vein. Ultrasound image was saved for documentation. The chest and neck were cleaned with a skin antiseptic and a sterile drape was placed. Maximal barrier sterile technique was utilized including caps, mask, sterile gowns, sterile gloves, sterile drape, hand hygiene and skin antiseptic. Ultrasound demonstrated the soft tissue lesion surrounding the sternum. The skin was anesthetized with 1% lidocaine. A small incision was made. Using ultrasound  guidance, an 18 gauge core biopsy device was directed into the lesion just anterior to the sternum. Multiple core biopsies were obtained and specimens were placed in formalin. Needle was removed and a bandage was placed over the puncture site. Gloves, needles and ultrasound probe cover that were used for the biopsy were discarded and replaced for the port placement. The right neck was anesthetized with 1% lidocaine. Small incision was made in the right neck with a blade. Micropuncture set was placed in the right internal jugular vein with ultrasound guidance. The micropuncture wire was used for measurement purposes. The right chest was anesthetized with 1% lidocaine with epinephrine. #15 blade was used to make an incision and a subcutaneous port pocket was formed. [place] was assembled. Subcutaneous tunnel was formed with a stiff tunneling device. The port catheter was brought through the subcutaneous tunnel. The port was placed in the subcutaneous pocket. The micropuncture set was exchanged for a peel-away sheath. The catheter was placed through the peel-away sheath and the tip was positioned at the superior cavoatrial junction. Catheter placement was confirmed with fluoroscopy. The port was accessed and flushed with saline. The port pocket was closed using two layers of absorbable sutures and Dermabond. The vein skin site was closed using a single layer of absorbable suture and Dermabond. Sterile dressings were applied. Patient tolerated the procedure well without an immediate complication. Ultrasound and fluoroscopic images were taken and saved for this procedure. FINDINGS: Hypoechoic soft tissue surrounding the anterior aspect of the sternum. Core biopsy needle was confirmed within the lesion. Multiple core samples were obtained and placed in formalin. Port-A-Cath tip is at the superior cavoatrial junction. IMPRESSION: Placement of a subcutaneous power-injectable port device. Catheter tip at the  superior cavoatrial junction. Port is accessed and ready to be used. Ultrasound-guided core biopsy of the sternal soft tissue lesion. Electronically Signed   By: Markus Daft M.D.   On: 01/06/2023 12:42   CT BONE MARROW BIOPSY & ASPIRATION  Result Date: 01/06/2023 INDICATION: Sclerotic metastatic bone disease EXAM: CT GUIDED RIGHT ILIAC BONE MARROW ASPIRATION AND CORE BIOPSY Date:  01/06/2023 01/06/2023 9:32 am Radiologist:  M. Daryll Brod, MD Guidance:  CT FLUOROSCOPY: Fluoroscopy Time: None. MEDICATIONS: 1% lidocaine local ANESTHESIA/SEDATION: 1.5 mg IV Versed; 75 mcg IV Fentanyl Moderate Sedation Time:  13 minute The patient was continuously monitored during the procedure by the interventional radiology nurse under my direct supervision. CONTRAST:  None. COMPLICATIONS: None PROCEDURE: Informed consent was obtained from the patient following explanation of the procedure, risks, benefits and alternatives. The patient understands, agrees and consents for the procedure. All questions were addressed. A time out was performed. The patient was positioned prone and non-contrast localization CT was performed of the pelvis to demonstrate the iliac marrow spaces. Maximal barrier sterile technique utilized including caps, mask, sterile gowns, sterile gloves, large sterile drape, hand hygiene, and Betadine prep. Under sterile conditions and local anesthesia, an 11 gauge coaxial bone biopsy needle was advanced  into the right iliac marrow space. Needle position was confirmed with CT imaging. Initially, bone marrow aspiration was attempted but yielded a dry tap, as expected. Next, the 11 gauge outer cannula was utilized to obtain 2 right iliac bone marrow core biopsy. Needle was removed. Hemostasis was obtained with compression. The patient tolerated the procedure well. Samples were prepared with the cytotechnologist. No immediate complications. IMPRESSION: CT guided right iliac bone marrow core biopsy. Electronically Signed    By: Jerilynn Mages.  Shick M.D.   On: 01/06/2023 10:02        Scheduled Meds:  sodium chloride   Intravenous Once   Chlorhexidine Gluconate Cloth  6 each Topical Daily   enoxaparin (LOVENOX) injection  40 mg Subcutaneous Q24H   fentaNYL  1 patch Transdermal Q72H   insulin aspart  0-20 Units Subcutaneous TID WC   insulin aspart  0-5 Units Subcutaneous QHS   insulin aspart  3 Units Subcutaneous TID WC   insulin glargine-yfgn  5 Units Subcutaneous QHS   ketorolac  30 mg Intravenous Q8H   lidocaine  10 mL Intradermal Once   polyethylene glycol  34 g Oral Daily   senna  1 tablet Oral BID   Continuous Infusions:  ondansetron (ZOFRAN) IV       LOS: 4 days     Alma Friendly, MD [facility]   If 7PM-7AM, please contact night-coverage [email or web address] 01/07/2023, 3:06 PM

## 2023-01-07 NOTE — Progress Notes (Signed)
   01/07/23 0500  Assess: MEWS Score  Temp 98.6 F (37 C)  BP (!) 149/73  MAP (mmHg) 95  Pulse Rate (!) 113  Resp 18  SpO2 96 %  O2 Device Room Air  Assess: MEWS Score  MEWS Temp 0  MEWS Systolic 0  MEWS Pulse 2  MEWS RR 0  MEWS LOC 0  MEWS Score 2  MEWS Score Color Yellow  Assess: if the MEWS score is Yellow or Red  Were vital signs taken at a resting state? Yes  Focused Assessment No change from prior assessment  Does the patient meet 2 or more of the SIRS criteria? Yes  Does the patient have a confirmed or suspected source of infection? No  Provider and Rapid Response Notified? Yes  MEWS guidelines implemented  Yes, yellow  Treat  MEWS Interventions Considered administering scheduled or prn medications/treatments as ordered  Take Vital Signs  Increase Vital Sign Frequency  Yellow: Q2hr x1, continue Q4hrs until patient remains green for 12hrs  Escalate  MEWS: Escalate Yellow: Discuss with charge nurse and consider notifying provider and/or RRT  Provider Notification  Provider Name/Title Tobey Bride  Date Provider Notified 01/07/23  Time Provider Notified 0530  Method of Notification Page  Notification Reason Other (Comment) (elevated heart rate, continuously elevated ( 110 to 117) even1 hr after pain meds. no c/o of dizziness, nausea, no chest pain symptoms related)  Provider response Other (Comment) (consider oxy prn pain meds, ( pain level of 6))  Date of Provider Response 01/07/23  Time of Provider Response 0545  Assess: SIRS CRITERIA  SIRS Temperature  0  SIRS Pulse 1  SIRS Respirations  0  SIRS WBC 0  SIRS Score Sum  1

## 2023-01-07 NOTE — Progress Notes (Signed)
Ms. Delmas had a busy day yesterday.  She had a Port-A-Cath placed.  She had a bone marrow biopsy done.  She had a sternal soft tissue mass biopsy done.  As always, I am very impressed with the efficiency of IR.  They really do a fantastic job.  He is still having a lot of discomfort of the shoulders.  So this might have to do with her being more anemic.  Her hemoglobin is only 8.3.  I think that she might be someone who we need to think about transfusing.  I think that a transfusion concerning help with respect to the pain.  Unfortunately, I have not been able to talk to her about a transfusion since she has never had one before.  She seems to be eating a little better.  She is going to the bathroom.  She is having no problems with cough or shortness of breath.  There is no nausea or vomiting.  Her electrolytes show sodium 134.  Potassium 3.2.  BUN 23 creatinine 0.66.  Calcium 7.9 with albumin of 2.2.  She did get Faslodex a couple days ago.  I am happy about that.  I told her to stop the [facility] as written can move ahead with systemic chemotherapy.  She does not have a echocardiogram to be done.  Hopefully this will be done today.  Her vital signs show temperature 98.5.  Pulse 111.  Blood pressure 156/76.  Her lungs sound clear bilaterally.  Cardiac exam is tachycardic but regular.  There are no murmurs.  Abdomen is soft.  Bowel sounds are present.  She has no fluid wave.  There is no palpable liver or spleen tip.  Extremity shows no clubbing, cyanosis or edema.  She does have some tenderness in the shoulders bilaterally.  Neurological exam is nonfocal.  Skin exam shows no rashes.   Ms. Sayler has progressive metastatic breast cancer.  Her disease had been confined to her bones.  We have been treating her for 2 and half years with antiestrogen therapy.  Now, we will have to use chemotherapy.  She has liver metastasis.  We really need to get the molecular studies on the sternal biopsy.  I really  need to see what her HER2 status is.  This will really dictate what our systemic therapy will be.  Again, I think that she probably would benefit from a blood transfusion.  Again have not been able to talk to her about this since the labs came back after I saw her this morning.  We will see what the echocardiogram shows.  I will have her on some Toradol.  Let us see if this may help with some of the discomfort.  I do appreciate the incredible care that she is getting from everybody up [address].  All the staff really are compassionate and do a fantastic job with all my patients.!!!!    Lattie Haw, MD  Lurena Joiner 22:19

## 2023-01-08 DIAGNOSIS — G893 Neoplasm related pain (acute) (chronic): Secondary | ICD-10-CM | POA: Diagnosis not present

## 2023-01-08 LAB — BASIC METABOLIC PANEL
Anion gap: 14 (ref 5–15)
BUN: 20 mg/dL (ref 6–20)
CO2: 26 mmol/L (ref 22–32)
Calcium: 8.2 mg/dL — ABNORMAL LOW (ref 8.9–10.3)
Chloride: 97 mmol/L — ABNORMAL LOW (ref 98–111)
Creatinine, Ser: 0.63 mg/dL (ref 0.44–1.00)
GFR, Estimated: >60 mL/min (ref >60)
Glucose, Bld: 135 mg/dL — ABNORMAL HIGH (ref 70–99)
Potassium: 3.4 mmol/L — ABNORMAL LOW (ref 3.5–5.1)
Sodium: 137 mmol/L (ref 135–145)

## 2023-01-08 LAB — CBC WITH DIFFERENTIAL/PLATELET
Abs Immature Granulocytes: 0.2 10*3/uL — ABNORMAL HIGH (ref 0.00–0.07)
Basophils Absolute: 0 10*3/uL (ref 0.0–0.1)
Basophils Relative: 0 %
Eosinophils Absolute: 0 10*3/uL (ref 0.0–0.5)
Eosinophils Relative: 0 %
HCT: 29.3 % — ABNORMAL LOW (ref 36.0–46.0)
Hemoglobin: 9.7 g/dL — ABNORMAL LOW (ref 12.0–15.0)
Lymphocytes Relative: 4 %
Lymphs Abs: 0.4 10*3/uL — ABNORMAL LOW (ref 0.7–4.0)
MCH: 28.3 pg (ref 26.0–34.0)
MCHC: 33.1 g/dL (ref 30.0–36.0)
MCV: 85.4 fL (ref 80.0–100.0)
Monocytes Absolute: 0.7 10*3/uL (ref 0.1–1.0)
Monocytes Relative: 7 %
Myelocytes: 2 %
Neutro Abs: 9.1 10*3/uL — ABNORMAL HIGH (ref 1.7–7.7)
Neutrophils Relative %: 87 %
Platelets: 206 10*3/uL (ref 150–400)
RBC: 3.43 MIL/uL — ABNORMAL LOW (ref 3.87–5.11)
RDW: 17.3 % — ABNORMAL HIGH (ref 11.5–15.5)
WBC: 10.5 10*3/uL (ref 4.0–10.5)
nRBC: 1.1 % — ABNORMAL HIGH (ref 0.0–0.2)
nRBC: 3 /100 WBC — ABNORMAL HIGH

## 2023-01-08 LAB — TYPE AND SCREEN
ABO/RH(D): O POS
Antibody Screen: NEGATIVE
Unit division: 0

## 2023-01-08 LAB — BPAM RBC
Blood Product Expiration Date: 202404232359
ISSUE DATE / TIME: 202403281355
Unit Type and Rh: 5100

## 2023-01-08 LAB — GLUCOSE, CAPILLARY
Glucose-Capillary: 153 mg/dL — ABNORMAL HIGH (ref 70–99)
Glucose-Capillary: 118 mg/dL — ABNORMAL HIGH (ref 70–99)
Glucose-Capillary: 170 mg/dL — ABNORMAL HIGH (ref 70–99)

## 2023-01-08 LAB — SURGICAL PATHOLOGY

## 2023-01-08 MED ORDER — HEPARIN SOD (PORK) LOCK FLUSH 100 UNIT/ML IV SOLN
500.0000 [IU] | INTRAVENOUS | Status: AC | PRN
  Administered 2023-01-08: 500 [IU]

## 2023-01-08 MED ORDER — OXYCODONE HCL 5 MG PO TABS: 5.0000 mg | ORAL_TABLET | ORAL | 0 refills | Status: DC | PRN

## 2023-01-08 MED ORDER — POLYETHYLENE GLYCOL 3350 17 G PO PACK: 17.0000 g | PACK | Freq: Every day | ORAL | 0 refills | Status: DC | PRN

## 2023-01-08 MED ORDER — SENNA 8.6 MG PO TABS: 1.0000 | ORAL_TABLET | Freq: Every day | ORAL | 0 refills | Status: AC | PRN

## 2023-01-08 MED ORDER — POTASSIUM CHLORIDE CRYS ER 20 MEQ PO TBCR
40.0000 meq | EXTENDED_RELEASE_TABLET | Freq: Once | ORAL | Status: AC
  Administered 2023-01-08: 40 meq via ORAL
  Filled 2023-01-08: qty 2

## 2023-01-08 MED ORDER — SODIUM CHLORIDE 0.9 % IV SOLN
510.0000 mg | Freq: Once | INTRAVENOUS | Status: AC
  Administered 2023-01-08: 510 mg via INTRAVENOUS
  Filled 2023-01-08: qty 17

## 2023-01-08 MED ORDER — FENTANYL 50 MCG/HR TD PT72: 1.0000 | MEDICATED_PATCH | TRANSDERMAL | 0 refills | Status: AC

## 2023-01-08 NOTE — Progress Notes (Signed)
Annette James is doing quite well.  She had her echocardiogram done yesterday.  She has a wonderful ejection fraction of 60-65%.  Patient did get a unit of blood yesterday.  Hemoglobin is 9.7 today.  Her iron saturation is 20%.  I do think would be worthwhile given her dose of IV iron.  Electrolytes show sodium 137.  Potassium 3.4.  BUN 20 creatinine 0.63.  Calcium 8.2.  Her white blood cells 10.5.  Hemoglobin 9.7.  Platelet count 206,000.  She says that her shoulder pain is doing better.  I suspect that the ketorolac is helped that.  I also suspect that the blood transfusion helped.  From my point of view, I think we can probably get her home today.  It would be wonderful if she could be home for Easter.  That would be nice for her to be able to enjoy her family on such a wonderful weekend.  She has a Port-A-Cath in.  Once I get the results back from her molecular markers regarding the HER2 status on her cancer, then we can see about systemic therapy for her breast cancer.  Her vital signs are stable.  Temperature 98.9.  Pulse 97.  Blood pressure 125/74.  Her lungs sound clear bilaterally.  She has good air movement bilaterally.  Cardiac exam regular rate and rhythm.  Abdomen is soft.  Bowel sounds are present.  There is no fluid wave.  There is no palpable liver or spleen tip.  Extremity shows no clubbing, cyanosis or edema.  Neurological exam is nonfocal.  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  She has been on systemic antiestrogen therapy for about 3 years.  She now is broken through.  We will have to treat her with chemotherapy.  Again, we really need to have the HER2 status to no how we can best treat her.  I will take care of this in the office.  I know that she has gotten incredible care from everybody up [address]  I do appreciate everybody's compassion.  Lattie Haw, MD  Oswaldo Milian 52:13

## 2023-01-08 NOTE — Plan of Care (Signed)

## 2023-01-09 NOTE — Discharge Summary (Signed)
Physician Discharge Summary   Patient: Annette James MRN: [id number] DOB: [birth year]  Admit date:     01/03/2023  Discharge date: 01/08/2023  Discharge Physician: Alma Friendly   PCP: [name], MD   Recommendations at discharge:   Follow-up with PCP in 1 week Follow-up with oncology as scheduled  Discharge Diagnoses: Principal Problem:   Cancer-related breakthrough pain Active Problems:   Breast cancer metastasized to bone, unspecified laterality ([place])   Class 1 obesity    Hospital Course: Annette James is a 62 y.o. female with medical history significant of metastatic breast cancer who presented to the department due to uncontrolled pain in her joints upper back and shoulders.  She saw her oncologist who gave her steroid pack without improvement.  She takes fentanyl and oxycodone as an outpatient.  Due to uncontrolled pain she presented to the ER.  On arrival she was found to be tachycardic in the 120s and hemodynamically stable.  Labs were obtained which revealed sodium 129, bicarb 21, glucose 391, AST 166, ALT 45, alkaline phosphatase 249, WBC 14.8, hemoglobin 10.7, CT chest abdomen pelvis was obtained which demonstrated diffuse skeletal metastasis including new lesions in the sternum, and new pleural effusion and new liver mets.  Patient admitted for further management of intractable generalized pain.    Today, pt denies any new complaints. Pain is controlled with pain meds. Stable for d/c    Assessment and Plan: Intractable malignancy-related skeletal pain Liver metastasis  CT of c/a/p shows diffuse skeletal metastasis MRI does show liver metastasis Status post bone marrow biopsy, biopsy of sternal soft tissue lesion and port placement on 3/27 Discharged on pain management   Metastatic breast cancer Palliative consulted for further goals of care discussions Outpt follow up   Elevated liver enzymes Likely 2/2 mets to the bone in addition to  liver mets Outpt follow up   Hypokalemia Replaced prn   Normocytic anemia Anemia of chronic disease S/p 1U of PRBC on 3/28 as hgb drifting dowards (also as per Dr Antonieta Pert request)   Hyperglycemia Last A1c done on 08/2022 showed 5.1 No dx of dm, did get recent steroids Home regimen   Leukocytosis Likely 2/2 reactive, in addition to recent steroid use     Consultants: Oncology, palliative, IR Procedures performed: Bone marrow, soft tissue biopsy, port placement on 3/27   Disposition: Home Diet recommendation:  Regular diet   DISCHARGE MEDICATION: Allergies as of 01/08/2023   No Known Allergies      Medication List     STOP taking these medications    methylPREDNISolone 4 MG Tbpk tablet Commonly known as: Medrol   Piqray (300 MG Daily Dose) tablet Generic drug: alpelisib 300 mg daily dose (2 X 150 mg oral tablets)       TAKE these medications    ascorbic acid 500 MG tablet Commonly known as: VITAMIN C Take 500 mg by mouth daily.   cyclobenzaprine 5 MG tablet Commonly known as: FLEXERIL TAKE 1 TABLET BY MOUTH THREE TIMES A DAY AS NEEDED FOR MUSCLE SPASM What changed: See the new instructions.   fentaNYL 50 MCG/HR Commonly known as: [place] 1 patch onto the skin every other day.   glimepiride 2 MG tablet Commonly known as: Amaryl Take 1 tablet (2 mg total) by mouth daily with breakfast.   ondansetron 8 MG tablet Commonly known as: Zofran Take 1 tablet (8 mg total) by mouth every 8 (eight) hours as needed for nausea or vomiting.  One-A-Day Womens 50 Plus Tabs Take 1 tablet by mouth daily.   oxyCODONE 5 MG immediate release tablet Commonly known as: Oxy IR/ROXICODONE Take 1-2 tablets (5-10 mg total) by mouth every 4 (four) hours as needed for up to 7 days. What changed: reasons to take this   polyethylene glycol 17 g packet Commonly known as: MiraLax Take 17 g by mouth daily as needed.   prochlorperazine 10 MG tablet Commonly  known as: COMPAZINE Take 1 tablet (10 mg total) by mouth every 6 (six) hours as needed for nausea or vomiting.   senna 8.6 MG Tabs tablet Commonly known as: SENOKOT Take 1 tablet (8.6 mg total) by mouth daily as needed for mild constipation.        Follow-up Information     [name], MD. Schedule an appointment as soon as possible for a visit in 1 week(s).   Specialty: Internal Medicine Contact information: [place] [address] [phone]                Discharge Exam: Danley Danker Weights   01/03/23 1455  Weight: 72.1 kg   General: NAD  Cardiovascular: S1, S2 present Respiratory: CTAB Abdomen: Soft, nontender, nondistended, bowel sounds present Musculoskeletal: No bilateral pedal edema noted Skin: Normal Psychiatry: Normal mood   Condition at discharge: stable  The results of significant diagnostics from this hospitalization (including imaging, microbiology, ancillary and laboratory) are listed below for reference.   Imaging Studies: ECHOCARDIOGRAM COMPLETE  Result Date: 01/07/2023    ECHOCARDIOGRAM REPORT   Patient Name:   Annette James [facility] Date of Exam: 01/07/2023 Medical Rec #:  [id number]            Height:       68.0 in Accession #:    [id number]           Weight:       159.0 lb Date of Birth:  04/08/1961            BSA:          1.854 m Patient Age:    49 years             BP:           142/78 mmHg Patient Gender: F                    HR:           105 bpm. Exam Location:  Inpatient Procedure: 2D Echo, 3D Echo, Cardiac Doppler, Color Doppler and Strain Analysis Indications:    Chemo  History:        Patient has no prior history of Echocardiogram examinations.                 Metastatic cancer, hx of radiation therapy.  Sonographer:    Eartha Inch Referring Phys: [place] Comments: Image acquisition challenging due to patient body habitus and Image acquisition challenging due to respiratory motion. Global  longitudinal strain was attempted. IMPRESSIONS  1. Left ventricular ejection fraction, by estimation, is 60 to 65%. The left ventricle has normal function. The left ventricle has no regional wall motion abnormalities. Left ventricular diastolic function could not be evaluated. The average left ventricular global longitudinal strain is -18.3 %. The global longitudinal strain is normal.  2. Right ventricular systolic function is normal. The right ventricular size is normal.  3. The mitral valve is normal in structure. No evidence of mitral valve  regurgitation. No evidence of mitral stenosis.  4. The aortic valve is normal in structure. Aortic valve regurgitation is not visualized. No aortic stenosis is present.  5. The inferior vena cava is normal in size with greater than 50% respiratory variability, suggesting right atrial pressure of 3 mmHg. FINDINGS  Left Ventricle: Left ventricular ejection fraction, by estimation, is 60 to 65%. The left ventricle has normal function. The left ventricle has no regional wall motion abnormalities. The average left ventricular global longitudinal strain is -18.3 %. The global longitudinal strain is normal. The left ventricular internal cavity size was normal in size. There is no left ventricular hypertrophy. Left ventricular diastolic function could not be evaluated. Right Ventricle: The right ventricular size is normal. No increase in right ventricular wall thickness. Right ventricular systolic function is normal. Left Atrium: Left atrial size was normal in size. Right Atrium: Right atrial size was normal in size. Pericardium: There is no evidence of pericardial effusion. Mitral Valve: The mitral valve is normal in structure. No evidence of mitral valve regurgitation. No evidence of mitral valve stenosis. MV peak gradient, 5.7 mmHg. The mean mitral valve gradient is 3.0 mmHg. Tricuspid Valve: The tricuspid valve is normal in structure. Tricuspid valve regurgitation is mild . No  evidence of tricuspid stenosis. Aortic Valve: The aortic valve is normal in structure. Aortic valve regurgitation is not visualized. No aortic stenosis is present. Pulmonic Valve: The pulmonic valve was normal in structure. Pulmonic valve regurgitation is not visualized. No evidence of pulmonic stenosis. Aorta: The aortic root is normal in size and structure. Venous: The inferior vena cava is normal in size with greater than 50% respiratory variability, suggesting right atrial pressure of 3 mmHg. IAS/Shunts: No atrial level shunt detected by color flow Doppler.  LEFT VENTRICLE PLAX 2D LVIDd:         4.50 cm   Diastology LVIDs:         2.70 cm   LV e' medial:  8.49 cm/s LV PW:         1.10 cm   LV e' lateral: 12.50 cm/s LV IVS:        0.90 cm LVOT diam:     2.40 cm   2D Longitudinal Strain LV SV:         118       2D Strain GLS (A2C):   -17.2 % LV SV Index:   63        2D Strain GLS (A3C):   -19.2 % LVOT Area:     4.52 cm  2D Strain GLS (A4C):   -18.6 %                          2D Strain GLS Avg:     -18.3 % RIGHT VENTRICLE            IVC RV S prime:     9.25 cm/s  IVC diam: 1.50 cm TAPSE (M-mode): 1.6 cm LEFT ATRIUM             Index        RIGHT ATRIUM           Index LA diam:        4.20 cm 2.27 cm/m   RA Area:     10.70 cm LA Vol (A2C):   60.6 ml 32.69 ml/m  RA Volume:   21.50 ml  11.60 ml/m LA Vol (A4C):   46.9 ml 25.30 ml/m LA  Biplane Vol: 56.0 ml 30.21 ml/m  AORTIC VALVE LVOT Vmax:   173.00 cm/s LVOT Vmean:  118.000 cm/s LVOT VTI:    0.260 m  AORTA Ao Root diam: 3.60 cm Ao Asc diam:  3.50 cm MITRAL VALVE             TRICUSPID VALVE MV Area VTI:  5.25 cm   TR Peak grad:   38.4 mmHg MV Peak grad: 5.7 mmHg   TR Mean grad:   29.0 mmHg MV Mean grad: 3.0 mmHg   TR Vmax:        310.00 cm/s MV Vmax:      1.19 m/s   TR Vmean:       258.0 cm/s MV Vmean:     85.4 cm/s                          SHUNTS                          Systemic VTI:  0.26 m                          Systemic Diam: 2.40 cm Fransico Him MD  Electronically signed by Fransico Him MD Signature Date/Time: 01/07/2023/3:22:57 PM    Final    IR IMAGING GUIDED PORT INSERTION  Result Date: 01/06/2023 INDICATION: 62 year old with history of stage IV breast cancer with concern for recurrent or progressive disease. Patient needs tissue sampling and Port-A-Cath. EXAM: FLUOROSCOPIC AND ULTRASOUND GUIDED PLACEMENT OF A SUBCUTANEOUS PORT ULTRASOUND-GUIDED CORE BIOPSY OF STERNAL SOFT TISSUE LESION COMPARISON:  None Available. MEDICATIONS: Moderate sedation ANESTHESIA/SEDATION: Moderate (conscious) sedation was employed during this procedure. A total of Versed 2.5 mg and fentanyl 125 mcg was administered intravenously at the order of the provider performing the procedure. Total intra-service moderate sedation time: 41 minutes. Patient's level of consciousness and vital signs were monitored continuously by radiology nurse throughout the procedure under the supervision of the provider performing the procedure. FLUOROSCOPY TIME:  Radiation Exposure Index (as provided by the fluoroscopic device): 2 mGy Kerma COMPLICATIONS: None immediate. PROCEDURE: The procedure, risks, benefits, and alternatives were explained to the patient. Questions regarding the procedure were encouraged and answered. The patient understands and consents to the procedure. Patient was placed supine on the interventional table. Ultrasound confirmed a patent right internal jugular vein. Ultrasound image was saved for documentation. The chest and neck were cleaned with a skin antiseptic and a sterile drape was placed. Maximal barrier sterile technique was utilized including caps, mask, sterile gowns, sterile gloves, sterile drape, hand hygiene and skin antiseptic. Ultrasound demonstrated the soft tissue lesion surrounding the sternum. The skin was anesthetized with 1% lidocaine. A small incision was made. Using ultrasound guidance, an 18 gauge core biopsy device was directed into the lesion just  anterior to the sternum. Multiple core biopsies were obtained and specimens were placed in formalin. Needle was removed and a bandage was placed over the puncture site. Gloves, needles and ultrasound probe cover that were used for the biopsy were discarded and replaced for the port placement. The right neck was anesthetized with 1% lidocaine. Small incision was made in the right neck with a blade. Micropuncture set was placed in the right internal jugular vein with ultrasound guidance. The micropuncture wire was used for measurement purposes. The right chest was anesthetized with 1% lidocaine with epinephrine. #15 blade was used to  make an incision and a subcutaneous port pocket was formed. [place] was assembled. Subcutaneous tunnel was formed with a stiff tunneling device. The port catheter was brought through the subcutaneous tunnel. The port was placed in the subcutaneous pocket. The micropuncture set was exchanged for a peel-away sheath. The catheter was placed through the peel-away sheath and the tip was positioned at the superior cavoatrial junction. Catheter placement was confirmed with fluoroscopy. The port was accessed and flushed with saline. The port pocket was closed using two layers of absorbable sutures and Dermabond. The vein skin site was closed using a single layer of absorbable suture and Dermabond. Sterile dressings were applied. Patient tolerated the procedure well without an immediate complication. Ultrasound and fluoroscopic images were taken and saved for this procedure. FINDINGS: Hypoechoic soft tissue surrounding the anterior aspect of the sternum. Core biopsy needle was confirmed within the lesion. Multiple core samples were obtained and placed in formalin. Port-A-Cath tip is at the superior cavoatrial junction. IMPRESSION: Placement of a subcutaneous power-injectable port device. Catheter tip at the superior cavoatrial junction. Port is accessed and ready to be used.  Ultrasound-guided core biopsy of the sternal soft tissue lesion. Electronically Signed   By: Markus Daft M.D.   On: 01/06/2023 12:42   IR US Guide Bx Asp/Drain  Result Date: 01/06/2023 INDICATION: 62 year old with history of stage IV breast cancer with concern for recurrent or progressive disease. Patient needs tissue sampling and Port-A-Cath. EXAM: FLUOROSCOPIC AND ULTRASOUND GUIDED PLACEMENT OF A SUBCUTANEOUS PORT ULTRASOUND-GUIDED CORE BIOPSY OF STERNAL SOFT TISSUE LESION COMPARISON:  None Available. MEDICATIONS: Moderate sedation ANESTHESIA/SEDATION: Moderate (conscious) sedation was employed during this procedure. A total of Versed 2.5 mg and fentanyl 125 mcg was administered intravenously at the order of the provider performing the procedure. Total intra-service moderate sedation time: 41 minutes. Patient's level of consciousness and vital signs were monitored continuously by radiology nurse throughout the procedure under the supervision of the provider performing the procedure. FLUOROSCOPY TIME:  Radiation Exposure Index (as provided by the fluoroscopic device): 2 mGy Kerma COMPLICATIONS: None immediate. PROCEDURE: The procedure, risks, benefits, and alternatives were explained to the patient. Questions regarding the procedure were encouraged and answered. The patient understands and consents to the procedure. Patient was placed supine on the interventional table. Ultrasound confirmed a patent right internal jugular vein. Ultrasound image was saved for documentation. The chest and neck were cleaned with a skin antiseptic and a sterile drape was placed. Maximal barrier sterile technique was utilized including caps, mask, sterile gowns, sterile gloves, sterile drape, hand hygiene and skin antiseptic. Ultrasound demonstrated the soft tissue lesion surrounding the sternum. The skin was anesthetized with 1% lidocaine. A small incision was made. Using ultrasound guidance, an 18 gauge core biopsy device was  directed into the lesion just anterior to the sternum. Multiple core biopsies were obtained and specimens were placed in formalin. Needle was removed and a bandage was placed over the puncture site. Gloves, needles and ultrasound probe cover that were used for the biopsy were discarded and replaced for the port placement. The right neck was anesthetized with 1% lidocaine. Small incision was made in the right neck with a blade. Micropuncture set was placed in the right internal jugular vein with ultrasound guidance. The micropuncture wire was used for measurement purposes. The right chest was anesthetized with 1% lidocaine with epinephrine. #15 blade was used to make an incision and a subcutaneous port pocket was formed. [place] was assembled. Subcutaneous tunnel  was formed with a stiff tunneling device. The port catheter was brought through the subcutaneous tunnel. The port was placed in the subcutaneous pocket. The micropuncture set was exchanged for a peel-away sheath. The catheter was placed through the peel-away sheath and the tip was positioned at the superior cavoatrial junction. Catheter placement was confirmed with fluoroscopy. The port was accessed and flushed with saline. The port pocket was closed using two layers of absorbable sutures and Dermabond. The vein skin site was closed using a single layer of absorbable suture and Dermabond. Sterile dressings were applied. Patient tolerated the procedure well without an immediate complication. Ultrasound and fluoroscopic images were taken and saved for this procedure. FINDINGS: Hypoechoic soft tissue surrounding the anterior aspect of the sternum. Core biopsy needle was confirmed within the lesion. Multiple core samples were obtained and placed in formalin. Port-A-Cath tip is at the superior cavoatrial junction. IMPRESSION: Placement of a subcutaneous power-injectable port device. Catheter tip at the superior cavoatrial junction. Port is accessed  and ready to be used. Ultrasound-guided core biopsy of the sternal soft tissue lesion. Electronically Signed   By: Markus Daft M.D.   On: 01/06/2023 12:42   CT BONE MARROW BIOPSY & ASPIRATION  Result Date: 01/06/2023 INDICATION: Sclerotic metastatic bone disease EXAM: CT GUIDED RIGHT ILIAC BONE MARROW ASPIRATION AND CORE BIOPSY Date:  01/06/2023 01/06/2023 9:32 am Radiologist:  M. Daryll Brod, MD Guidance:  CT FLUOROSCOPY: Fluoroscopy Time: None. MEDICATIONS: 1% lidocaine local ANESTHESIA/SEDATION: 1.5 mg IV Versed; 75 mcg IV Fentanyl Moderate Sedation Time:  13 minute The patient was continuously monitored during the procedure by the interventional radiology nurse under my direct supervision. CONTRAST:  None. COMPLICATIONS: None PROCEDURE: Informed consent was obtained from the patient following explanation of the procedure, risks, benefits and alternatives. The patient understands, agrees and consents for the procedure. All questions were addressed. A time out was performed. The patient was positioned prone and non-contrast localization CT was performed of the pelvis to demonstrate the iliac marrow spaces. Maximal barrier sterile technique utilized including caps, mask, sterile gowns, sterile gloves, large sterile drape, hand hygiene, and Betadine prep. Under sterile conditions and local anesthesia, an 11 gauge coaxial bone biopsy needle was advanced into the right iliac marrow space. Needle position was confirmed with CT imaging. Initially, bone marrow aspiration was attempted but yielded a dry tap, as expected. Next, the 11 gauge outer cannula was utilized to obtain 2 right iliac bone marrow core biopsy. Needle was removed. Hemostasis was obtained with compression. The patient tolerated the procedure well. Samples were prepared with the cytotechnologist. No immediate complications. IMPRESSION: CT guided right iliac bone marrow core biopsy. Electronically Signed   By: Jerilynn Mages.  Shick M.D.   On: 01/06/2023 10:02    NM Bone Scan Whole Body  Result Date: 01/05/2023 CLINICAL DATA:  Metastatic breast cancer. Assess treatment response. EXAM: NUCLEAR MEDICINE WHOLE BODY BONE SCAN TECHNIQUE: Whole body anterior and posterior images were obtained approximately 3 hours after intravenous injection of radiopharmaceutical. RADIOPHARMACEUTICALS:  21.8. mCi Technetium-31m MDP IV COMPARISON:  CT chest, abdomen and pelvis from 01/03/2023 and PET-CT from 06/08/2022. FINDINGS: Multifocal abnormal areas of increased radiotracer uptake are identified throughout the axial and appendicular skeleton compatible with diffuse osseous metastasis. Increased radiotracer uptake is identified within the bilateral frontal bones, bilateral posterior ribs, sternum, and midthoracic spine. Increased uptake within the L4-5 vertebra also noted. There is also increased radiotracer uptake within both sides of the sacrum, and bilateral anterior iliac bones. Tracer uptake within the humeral  heads is also noted bilaterally. Compared with the FDG PET from 06/08/2022 the extent, and distribution of bone metastasis appears similar. Normal physiologic tracer uptake seen within the kidneys an urinary bladder. IMPRESSION: 1. Compared with the FDG PET from 06/08/2022 there is a similar appearance of widespread osseous metastasis involving the axial and appendicular skeleton. Electronically Signed   By: Kerby Moors M.D.   On: 01/05/2023 15:26   MR LIVER W WO CONTRAST  Result Date: 01/05/2023 CLINICAL DATA:  History of breast cancer. Evaluate for liver metastases. Elevated LFTs. EXAM: MRI ABDOMEN WITHOUT AND WITH CONTRAST TECHNIQUE: Multiplanar multisequence MR imaging of the abdomen was performed both before and after the administration of intravenous contrast. CONTRAST:  40mL GADAVIST GADOBUTROL 1 MMOL/ML IV SOLN COMPARISON:  CT chest, abdomen and pelvis from 01/03/2023 and PET-CT from 09/11/2022 FINDINGS: Lower chest: Small left pleural effusion identified.  Hepatobiliary: Numerous T2 hyperintense and T1 hypointense lesions are identified throughout the liver. These exhibit typical rim enhancement seen with liver metastases. These lesions appear new when compared with previous imaging prior to 09/11/2022. Index lesions include: -lesion in segment 4 B measures 1.4 x 1.2 cm, image 67/1002 -Index lesion within segment 7 measures 1.4 x 0.9 cm, image 39/1002. -lesion in segment 6 measures 0.7 x 0.7 cm, image 95/1002 The gallbladder appears surgically absent. There is increase caliber of the common bile duct which measures up to 9 mm. No choledocholithiasis identified. Pancreas: No signs of pancreatic inflammation or mass. Mild increase caliber of the main pancreatic duct measures upper limits of normal at 3 mm, image 23/3. Spleen:  Within normal limits in size and appearance. Adrenals/Urinary Tract: Normal adrenal glands. No nephrolithiasis, hydronephrosis or suspicious mass. Stomach/Bowel: Stomach appears within normal limits. No dilated bowel loops identified. No bowel wall thickening or inflammation noted. Vascular/Lymphatic: Normal caliber of the abdominal aorta. No abdominal adenopathy. Other:  No ascites or focal fluid collections. Musculoskeletal: Mild diffuse body wall edema suggesting anasarca. The bone marrow is diffusely abnormal compatible with diffuse osseous metastasis. IMPRESSION: 1. Numerous liver metastases are identified which appear new when compared with previous imaging prior to 09/11/2022 2. Diffuse osseous metastasis. 3. Mild increase caliber of the common bile duct and main pancreatic duct. No choledocholithiasis or obstructing mass identified. 4. Small left pleural effusion. 5. Mild diffuse body wall edema suggesting anasarca. Electronically Signed   By: Kerby Moors M.D.   On: 01/05/2023 09:41   CT CHEST ABDOMEN PELVIS W CONTRAST  Result Date: 01/03/2023 CLINICAL DATA:  Cancer-related pain. Currently on chemotherapy for metastatic breast  cancer. EXAM: CT CHEST, ABDOMEN, AND PELVIS WITH CONTRAST TECHNIQUE: Multidetector CT imaging of the chest, abdomen and pelvis was performed following the standard protocol during bolus administration of intravenous contrast. RADIATION DOSE REDUCTION: This exam was performed according to the departmental dose-optimization program which includes automated exposure control, adjustment of the mA and/or kV according to patient size and/or use of iterative reconstruction technique. CONTRAST:  70mL OMNIPAQUE IOHEXOL 350 MG/ML SOLN COMPARISON:  PET-CT 09/11/2022. CT chest abdomen and pelvis 12/16/2020 FINDINGS: CT CHEST FINDINGS Cardiovascular: Normal heart size. No pericardial effusions. Normal caliber thoracic aorta. No aortic dissection. Great vessel origins are patent. Mediastinum/Nodes: Thyroid gland is unremarkable. Esophagus is decompressed. No significant lymphadenopathy in the chest. Lungs/Pleura: Lungs are clear. No pulmonary nodules, consolidation, or airspace disease. Small left pleural effusion. The effusion is new since the prior PET-CT. Musculoskeletal: Diffuse skeletal metastasis throughout the visualized bones with mixed lucency and sclerosis. Old rib fractures. New since  the prior study, there is soft tissue lesion with low-attenuation anterior to the mid sternum. This could represent soft tissue tumor with tumor necrosis or an abscess. This is new since the prior PET-CT. CT ABDOMEN PELVIS FINDINGS Hepatobiliary: Several vague low-attenuation lesions demonstrated in the liver. Most prominent is in the anterior segment of the right lobe of the liver, measuring 1.7 cm diameter. This lesion was not seen on prior studies and no uptake was shown on the PET-CT. This may represent developing hepatic metastasis. Gallbladder is surgically absent. No bile duct dilatation. Pancreas: Unremarkable. No pancreatic ductal dilatation or surrounding inflammatory changes. Spleen: Normal in size without focal abnormality.  Adrenals/Urinary Tract: No adrenal gland nodules. Kidneys are symmetrical with homogeneous nephrograms. No hydronephrosis or hydroureter. Bladder is unremarkable. Stomach/Bowel: Stomach, small bowel, and colon are not abnormally distended. No wall thickening or inflammatory changes are appreciated. Appendix is surgically absent. Vascular/Lymphatic: No significant vascular findings are present. No enlarged abdominal or pelvic lymph nodes. Reproductive: Uterus is not enlarged. Cyst in the left ovary measuring 4.6 x 3.5 cm. This is unchanged since prior study. Other: Small amount of free fluid in the upper abdomen. No free air. Abdominal wall musculature appears intact. Soft tissue edema in the subcutaneous fat. Musculoskeletal: Diffuse skeletal metastasis throughout the visualized bones with mixed lucent and sclerotic change. IMPRESSION: 1. Diffuse skeletal metastasis. 2. New since prior study, there is low-attenuation soft tissue lesion anterior to the sternum possibly representing soft tissue tumor or abscess. 3. New since the prior study are a small left pleural effusion and small amount of free fluid in the upper abdomen. 4. New since the prior study are vague low-attenuation lesions in the liver possibly representing developing metastasis. 5. Unchanged appearance of 4.6 cm left ovarian cyst. Due to the stability of this lesion, no additional imaging follow-up is recommended. Note: This recommendation does not apply to premenarchal patients and to those with increased risk (genetic, family history, elevated tumor markers or other high-risk factors) of ovarian cancer. Reference: JACR 2020 Feb; 17(2):248-254 Electronically Signed   By: Lucienne Capers M.D.   On: 01/03/2023 20:15    Microbiology: Results for orders placed or performed during the hospital encounter of 12/16/20  Resp Panel by RT-PCR (Flu A&B, Covid) Nasopharyngeal Swab     Status: None   Collection Time: 12/16/20 11:30 PM   Specimen:  Nasopharyngeal Swab; Nasopharyngeal(NP) swabs in vial transport medium  Result Value Ref Range Status   SARS Coronavirus 2 by RT PCR NEGATIVE NEGATIVE Final    Comment: (NOTE) SARS-CoV-2 target nucleic acids are NOT DETECTED.  The SARS-CoV-2 RNA is generally detectable in upper respiratory specimens during the acute phase of infection. The lowest concentration of SARS-CoV-2 viral copies this assay can detect is 138 copies/mL. A negative result does not preclude SARS-Cov-2 infection and should not be used as the sole basis for treatment or other patient management decisions. A negative result may occur with  improper specimen collection/handling, submission of specimen other than nasopharyngeal swab, presence of viral mutation(s) within the areas targeted by this assay, and inadequate number of viral copies(<138 copies/mL). A negative result must be combined with clinical observations, patient history, and epidemiological information. The expected result is Negative.  Fact Sheet for Patients:  EntrepreneurPulse.com.au  Fact Sheet for Healthcare Providers:  IncredibleEmployment.be  This test is no t yet approved or cleared by the [place] FDA and  has been authorized for detection and/or diagnosis of SARS-CoV-2 by FDA under an Emergency Use Authorization (  EUA). This EUA will remain  in effect (meaning this test can be used) for the duration of the COVID-19 declaration under Section 564(b)(1) of the Act, 21 U.S.C.section 360bbb-3(b)(1), unless the authorization is terminated  or revoked sooner.       Influenza A by PCR NEGATIVE NEGATIVE Final   Influenza B by PCR NEGATIVE NEGATIVE Final    Comment: (NOTE) The Xpert Xpress SARS-CoV-2/FLU/RSV plus assay is intended as an aid in the diagnosis of influenza from Nasopharyngeal swab specimens and should not be used as a sole basis for treatment. Nasal washings and aspirates are unacceptable for  Xpert Xpress SARS-CoV-2/FLU/RSV testing.  Fact Sheet for Patients: EntrepreneurPulse.com.au  Fact Sheet for Healthcare Providers: IncredibleEmployment.be  This test is not yet approved or cleared by the [place] FDA and has been authorized for detection and/or diagnosis of SARS-CoV-2 by FDA under an Emergency Use Authorization (EUA). This EUA will remain in effect (meaning this test can be used) for the duration of the COVID-19 declaration under Section 564(b)(1) of the Act, 21 U.S.C. section 360bbb-3(b)(1), unless the authorization is terminated or revoked.  Performed at [facility], [place] [address] [place], [place] [address]   C Difficile Quick Screen w PCR reflex     Status: Abnormal   Collection Time: 12/17/20  3:52 PM   Specimen: STOOL  Result Value Ref Range Status   C Diff antigen POSITIVE (A) NEGATIVE Final   C Diff toxin NEGATIVE NEGATIVE Final   C Diff interpretation Results are indeterminate. See PCR results.  Final    Comment: Performed at [facility], [place] [address], [place], [place] [address]  Gastrointestinal Panel by PCR , Stool     Status: None   Collection Time: 12/17/20  3:52 PM   Specimen: Stool  Result Value Ref Range Status   Campylobacter species NOT DETECTED NOT DETECTED Final   Plesimonas shigelloides NOT DETECTED NOT DETECTED Final   Salmonella species NOT DETECTED NOT DETECTED Final   Yersinia enterocolitica NOT DETECTED NOT DETECTED Final   Vibrio species NOT DETECTED NOT DETECTED Final   Vibrio cholerae NOT DETECTED NOT DETECTED Final   Enteroaggregative E coli (EAEC) NOT DETECTED NOT DETECTED Final   Enteropathogenic E coli (EPEC) NOT DETECTED NOT DETECTED Final   Enterotoxigenic E coli (ETEC) NOT DETECTED NOT DETECTED Final   Shiga like toxin producing E coli (STEC) NOT DETECTED NOT DETECTED Final   Shigella/Enteroinvasive E coli (EIEC) NOT DETECTED NOT DETECTED  Final   Cryptosporidium NOT DETECTED NOT DETECTED Final   Cyclospora cayetanensis NOT DETECTED NOT DETECTED Final   Entamoeba histolytica NOT DETECTED NOT DETECTED Final   Giardia lamblia NOT DETECTED NOT DETECTED Final   Adenovirus F40/41 NOT DETECTED NOT DETECTED Final   Astrovirus NOT DETECTED NOT DETECTED Final   Norovirus GI/GII NOT DETECTED NOT DETECTED Final   Rotavirus A NOT DETECTED NOT DETECTED Final   Sapovirus (I, II, IV, and V) NOT DETECTED NOT DETECTED Final    Comment: Performed at [facility], [address] [place], [place] [address]  C. Diff by PCR, Reflexed     Status: Abnormal   Collection Time: 12/17/20  3:52 PM  Result Value Ref Range Status   Toxigenic C. Difficile by PCR POSITIVE (A) NEGATIVE Final    Comment: Positive for toxigenic C. difficile with little to no toxin production. Only treat if clinical presentation suggests symptomatic illness. Performed at [facility], [place] [address], [place],  [address]  Labs: CBC: Recent Labs  Lab 01/03/23 1550 01/03/23 2022 01/05/23 0109 01/06/23 0120 01/07/23 0303 01/07/23 1833 01/08/23 0349  WBC 14.8* 14.6* 13.2* 11.8* 10.5  --  10.5  NEUTROABS 11.8*  --  12.4* 9.4* 8.0*  --  9.1*  HGB 10.7* 9.6* 9.9* 8.9* 8.3* 10.1* 9.7*  HCT 34.3* 29.9* 30.9* 28.4* 26.8* 30.4* 29.3*  MCV 86.2 84.9 85.1 85.3 85.6  --  85.4  PLT 251 225 227 263 236  --  [id number]   Basic Metabolic Panel: Recent Labs  Lab 01/03/23 1550 01/03/23 2022 01/04/23 0308 01/05/23 0109 01/07/23 0303 01/08/23 0349  NA 129*  --  132* 132* 134* 137  K 3.9  --  3.5 4.0 3.2* 3.4*  CL 95*  --  99 96* 99 97*  CO2 21*  --  23 25 25 26   GLUCOSE 391*  --  240* 407* 152* 135*  BUN 17  --  14 19 23* 20  CREATININE 0.71 0.68 0.66 0.78 0.66 0.63  CALCIUM 8.6*  --  7.9* 8.2* 7.9* 8.2*   Liver Function Tests: Recent Labs  Lab 01/03/23 1550 01/04/23 0308 01/05/23 0109 01/07/23 0303  AST 166* 141* 77* 58*  ALT 45* 35 60* 34   ALKPHOS 249* 217* 242* 202*  BILITOT 1.0 0.9 0.8 1.0  PROT 6.6 5.7* 5.6* 5.3*  ALBUMIN 2.7* 2.3* 2.1* 2.2*   CBG: Recent Labs  Lab 01/07/23 1646 01/07/23 1946 01/08/23 0754 01/08/23 1221 01/08/23 1623  GLUCAP 115* 159* 153* 118* 170*    Discharge time spent: greater than 30 minutes.  Signed: Alma Friendly, MD [facility] 01/09/2023

## 2023-01-11 ENCOUNTER — Encounter: Payer: Self-pay | Admitting: *Deleted

## 2023-01-11 ENCOUNTER — Telehealth: Payer: Self-pay | Admitting: *Deleted

## 2023-01-11 NOTE — Transitions of Care (Post Inpatient/ED Visit) (Signed)
   01/11/2023  Name: Annette James MRN: [id number] DOB: 13-Feb-1961  Today's TOC FU Call Status: Today's TOC FU Call Status:: Unsuccessul Call (1st Attempt) Unsuccessful Call (1st Attempt) Date: 01/11/23  Attempted to reach the patient regarding the most recent Inpatient visit; left HIPAA compliant voice message requesting call back  Follow Up Plan: Additional outreach attempts will be made to reach the patient to complete the Transitions of Care (Post Inpatient visit) call.   Oneta Rack, RN, BSN, CCRN Alumnus RN CM Care Coordination/ Transition of [place] Management [phone]: direct office

## 2023-01-12 ENCOUNTER — Other Ambulatory Visit: Payer: Self-pay | Admitting: Hematology & Oncology

## 2023-01-12 ENCOUNTER — Encounter: Payer: Self-pay | Admitting: *Deleted

## 2023-01-12 ENCOUNTER — Telehealth: Payer: Self-pay | Admitting: *Deleted

## 2023-01-12 DIAGNOSIS — C50919 Malignant neoplasm of unspecified site of unspecified female breast: Secondary | ICD-10-CM

## 2023-01-12 LAB — SURGICAL PATHOLOGY

## 2023-01-12 NOTE — Transitions of Care (Post Inpatient/ED Visit) (Signed)
   01/12/2023  Name: Annette James MRN: [id number] DOB: 07-19-1961  Today's TOC FU Call Status: Today's TOC FU Call Status:: Unsuccessful Call (2nd Attempt) Unsuccessful Call (2nd Attempt) Date: 01/12/23  Attempted to reach the patient regarding the most recent Inpatient visit; left HIPAA compliant voice message requesting call back  Follow Up Plan: Additional outreach attempts will be made to reach the patient to complete the Transitions of Care (Post Inpatient visit) call.   Oneta Rack, RN, BSN, CCRN Alumnus RN CM Care Coordination/ Transition of [place] Management [phone]: direct office

## 2023-01-12 NOTE — Progress Notes (Signed)
Per Dr Marin Olp, request to [facility] One testing sent on specimen [id number] DOS 01/06/2023  Oncology Nurse Navigator Documentation     01/12/2023    7:30 AM  Oncology Nurse Navigator Flowsheets  Navigator Location [facility]  Navigator Encounter Type Pathology Review  Barriers/Navigation Needs Coordination of Care  Interventions Coordination of Care  Coordination of Care Pathology  Time Spent with Patient 30

## 2023-01-13 ENCOUNTER — Telehealth: Payer: Self-pay | Admitting: *Deleted

## 2023-01-13 ENCOUNTER — Encounter: Payer: Self-pay | Admitting: *Deleted

## 2023-01-13 NOTE — Transitions of Care (Post Inpatient/ED Visit) (Signed)
   01/13/2023  Name: Annette James MRN: [id number] DOB: 20-Mar-1961  Today's TOC FU Call Status: Today's TOC FU Call Status:: Unsuccessful Call (3rd Attempt) Unsuccessful Call (3rd Attempt) Date: 01/13/23  Attempted to reach the patient regarding the most recent Inpatient visit; left HIPAA compliant voice message requesting call back  Follow Up Plan: No further outreach attempts will be made at this time. We have been unable to contact the patient.  Oneta Rack, RN, BSN, CCRN Alumnus RN CM Care Coordination/ Transition of [place] Management [phone]: direct office

## 2023-01-14 ENCOUNTER — Inpatient Hospital Stay: Payer: BC Managed Care – PPO | Attending: Hematology & Oncology

## 2023-01-14 ENCOUNTER — Inpatient Hospital Stay (HOSPITAL_BASED_OUTPATIENT_CLINIC_OR_DEPARTMENT_OTHER): Payer: BC Managed Care – PPO | Admitting: Hematology & Oncology

## 2023-01-14 ENCOUNTER — Encounter: Payer: Self-pay | Admitting: Hematology & Oncology

## 2023-01-14 DIAGNOSIS — D500 Iron deficiency anemia secondary to blood loss (chronic): Secondary | ICD-10-CM

## 2023-01-14 DIAGNOSIS — C50811 Malignant neoplasm of overlapping sites of right female breast: Secondary | ICD-10-CM | POA: Insufficient documentation

## 2023-01-14 DIAGNOSIS — Z170 Estrogen receptor positive status [ER+]: Secondary | ICD-10-CM | POA: Diagnosis not present

## 2023-01-14 DIAGNOSIS — Z5111 Encounter for antineoplastic chemotherapy: Secondary | ICD-10-CM | POA: Diagnosis not present

## 2023-01-14 DIAGNOSIS — C50919 Malignant neoplasm of unspecified site of unspecified female breast: Secondary | ICD-10-CM

## 2023-01-14 DIAGNOSIS — C7951 Secondary malignant neoplasm of bone: Secondary | ICD-10-CM

## 2023-01-14 DIAGNOSIS — D649 Anemia, unspecified: Secondary | ICD-10-CM | POA: Diagnosis not present

## 2023-01-14 LAB — FERRITIN: Ferritin: 2963 ng/mL — ABNORMAL HIGH (ref 11–307)

## 2023-01-14 LAB — CBC WITH DIFFERENTIAL (CANCER CENTER ONLY)
Abs Immature Granulocytes: 0.57 10*3/uL — ABNORMAL HIGH (ref 0.00–0.07)
Basophils Absolute: 0 10*3/uL (ref 0.0–0.1)
Basophils Relative: 0 %
Eosinophils Absolute: 0 10*3/uL (ref 0.0–0.5)
Eosinophils Relative: 0 %
HCT: 29.2 % — ABNORMAL LOW (ref 36.0–46.0)
Hemoglobin: 9.2 g/dL — ABNORMAL LOW (ref 12.0–15.0)
Immature Granulocytes: 5 %
Lymphocytes Relative: 4 %
Lymphs Abs: 0.5 10*3/uL — ABNORMAL LOW (ref 0.7–4.0)
MCH: 27.4 pg (ref 26.0–34.0)
MCHC: 31.5 g/dL (ref 30.0–36.0)
MCV: 86.9 fL (ref 80.0–100.0)
Monocytes Absolute: 0.7 10*3/uL (ref 0.1–1.0)
Monocytes Relative: 6 %
Neutro Abs: 10.4 10*3/uL — ABNORMAL HIGH (ref 1.7–7.7)
Neutrophils Relative %: 85 %
Platelet Count: 180 10*3/uL (ref 150–400)
RBC: 3.36 MIL/uL — ABNORMAL LOW (ref 3.87–5.11)
RDW: 17.8 % — ABNORMAL HIGH (ref 11.5–15.5)
WBC Count: 12.2 10*3/uL — ABNORMAL HIGH (ref 4.0–10.5)
nRBC: 1.1 % — ABNORMAL HIGH (ref 0.0–0.2)

## 2023-01-14 LAB — CMP (CANCER CENTER ONLY)
ALT: 91 U/L — ABNORMAL HIGH (ref 0–44)
AST: 88 U/L — ABNORMAL HIGH (ref 15–41)
Albumin: 2.1 g/dL — ABNORMAL LOW (ref 3.5–5.0)
Alkaline Phosphatase: 360 U/L — ABNORMAL HIGH (ref 38–126)
Anion gap: 14 (ref 5–15)
BUN: 17 mg/dL (ref 6–20)
CO2: 23 mmol/L (ref 22–32)
Calcium: 8.1 mg/dL — ABNORMAL LOW (ref 8.9–10.3)
Chloride: 94 mmol/L — ABNORMAL LOW (ref 98–111)
Creatinine: 0.48 mg/dL (ref 0.44–1.00)
GFR, Estimated: >60 mL/min (ref >60)
Glucose, Bld: 117 mg/dL — ABNORMAL HIGH (ref 70–99)
Potassium: 3.9 mmol/L (ref 3.5–5.1)
Sodium: 131 mmol/L — ABNORMAL LOW (ref 135–145)
Total Bilirubin: 0.5 mg/dL (ref 0.3–1.2)
Total Protein: 5.5 g/dL — ABNORMAL LOW (ref 6.5–8.1)

## 2023-01-14 LAB — LACTATE DEHYDROGENASE: LDH: 297 U/L — ABNORMAL HIGH (ref 98–192)

## 2023-01-14 MED ORDER — OXYCODONE HCL 5 MG PO TABS: 5.0000 mg | ORAL_TABLET | ORAL | 0 refills | Status: DC | PRN

## 2023-01-14 MED ORDER — DRONABINOL 5 MG PO CAPS: 5.0000 mg | ORAL_CAPSULE | Freq: Two times a day (BID) | ORAL | 0 refills | Status: DC

## 2023-01-14 NOTE — Progress Notes (Signed)
Hematology and Oncology Follow Up Visit  Noon Sipp [id number] [birth year] 62 y.o. 01/14/2023   Principle Diagnosis:  Metastatic breast cancer-bone only metastasis-ER positive/PR positive/HER2 LOW (1+)///  PIK3CA (+) -- progressive   Current Therapy:        Faslodex 500 mg IM monthly-start on 11/06/2020 Ibrance 125 MG PO Q DAY (21 days on/7 days off) - d/c on 08/12/2022 XRT to the RIGHT hip  --completed on 07/24/2021 Zometa 4 mg IV q. 3 months -- next dose on 02/2023 XRT to T12 -- completed on 07/14/2022 Piqray 300 mg po q day -- start on 08/24/2022 -- d/c on 01/03/2023 Adria/Cytoxan -- start cycle #1 on 01/19/2023    Interim History:  Ms. Mulvaney is here today for follow-up.  She comes in after being hospitalized.  She was hospitalized because of severe pain.  Unfortunately, it was obvious that her disease was progressing.  She had scans done which showed disease progression.  We have been following her CA 27.29.  There is also has been going up.  When we last checked on 01/05/2023 it was 145.  We did do biopsies.  She actually had multiple biopsies.  She initially had a bone marrow biopsy done.  This was done on 01/06/2023.  The pathology report ([id number]) showed extensive marrow involvement by metastatic carcinoma.  She also had a soft tissue mass biopsy from the anterior sternum.  This was also done on 01/06/2023.  The pathology report [id number]) showed metastatic breast cancer.  We are able to get some prognostic markers.  The tumor was HER2 negative (1+).  She was seen by Palliative Care.  They helped with some of her pain control.  Unfortunate, her pain seems to be little bit worse.  She has been trying to spread out her short-term oxycodone.  She has been scheduled to take this every 4 hours but she was running low.  Will have to replace this.  She is not eating much.  Some of this might be from her having pain.  I do think that Marinol might help.  I will call in  Marinol at 5 mg p.o. twice daily.  We clearly got have to get chemotherapy going.  We did go ahead and get a Port-A-Cath on her in the hospital.  She has an echocardiogram that was done in the hospital.  Echocardiogram was done on 01/07/2023.  This showed a left ventricular ejection fraction of 60-65%.  I think that a very good protocol for her would be Adriamycin/Cytoxan.  I think this would be effective.  She comes in with one of her daughters.  We had a long talk about the treatment.  I gave them information sheets about the protocol.  I went over side effects.  I just want her quality of life to be better.  Hopefully, if we get her cancer to progress, this will help her pain this will also improve her quality of life.  Currently, I would say that her performance status is probably ECOG 1.   Medications:  Allergies as of 01/14/2023   No Known Allergies      Medication List        Accurate as of January 14, 2023  4:41 PM. If you have any questions, ask your nurse or doctor.          ascorbic acid 500 MG tablet Commonly known as: VITAMIN C Take 500 mg by mouth daily.   cyclobenzaprine 5 MG tablet Commonly known as: FLEXERIL TAKE 1  TABLET BY MOUTH THREE TIMES A DAY AS NEEDED FOR MUSCLE SPASM What changed: See the new instructions.   dronabinol 5 MG capsule Commonly known as: MARINOL Take 1 capsule (5 mg total) by mouth 2 (two) times daily before a meal. Started by: Volanda Napoleon, MD   fentaNYL 50 MCG/HR Commonly known as: Angels 1 patch onto the skin every other day.   glimepiride 2 MG tablet Commonly known as: Amaryl Take 1 tablet (2 mg total) by mouth daily with breakfast.   ondansetron 8 MG tablet Commonly known as: Zofran Take 1 tablet (8 mg total) by mouth every 8 (eight) hours as needed for nausea or vomiting.   One-A-Day Womens 50 Plus Tabs Take 1 tablet by mouth daily.   oxyCODONE 5 MG immediate release tablet Commonly known as: Oxy  IR/ROXICODONE Take 1-2 tablets (5-10 mg total) by mouth every 4 (four) hours as needed.   polyethylene glycol 17 g packet Commonly known as: MiraLax Take 17 g by mouth daily as needed.   prochlorperazine 10 MG tablet Commonly known as: COMPAZINE Take 1 tablet (10 mg total) by mouth every 6 (six) hours as needed for nausea or vomiting.   senna 8.6 MG Tabs tablet Commonly known as: SENOKOT Take 1 tablet (8.6 mg total) by mouth daily as needed for mild constipation.        Allergies: No Known Allergies  Past Medical History, Surgical history, Social history, and Family History were reviewed and updated.  Review of Systems: Review of Systems  Constitutional: Negative.   HENT: Negative.    Eyes: Negative.   Respiratory: Negative.    Cardiovascular: Negative.   Gastrointestinal: Negative.   Genitourinary: Negative.   Musculoskeletal:  Positive for joint pain and neck pain.  Skin: Negative.   Neurological: Negative.   Endo/Heme/Allergies: Negative.   Psychiatric/Behavioral: Negative.        Physical Exam:  Her vital signs show a temperature of 98.9.  Pulse 115.  Blood pressure 126/81.  Weight was 150 pounds.  Wt Readings from Last 3 Encounters:  01/03/23 159 lb (72.1 kg)  12/09/22 159 lb (72.1 kg)  11/11/22 162 lb 1.9 oz (73.5 kg)    Physical Exam Vitals reviewed.  HENT:     Head: Normocephalic and atraumatic.  Eyes:     Pupils: Pupils are equal, round, and reactive to light.  Cardiovascular:     Rate and Rhythm: Normal rate and regular rhythm.     Heart sounds: Normal heart sounds.  Pulmonary:     Effort: Pulmonary effort is normal.     Breath sounds: Normal breath sounds.  Abdominal:     General: Bowel sounds are normal.     Palpations: Abdomen is soft.  Musculoskeletal:        General: No tenderness or deformity. Normal range of motion.     Cervical back: Normal range of motion.  Lymphadenopathy:     Cervical: No cervical adenopathy.  Skin:     General: Skin is warm and dry.     Findings: No erythema or rash.  Neurological:     Mental Status: She is alert and oriented to person, place, and time.  Psychiatric:        Behavior: Behavior normal.        Thought Content: Thought content normal.        Judgment: Judgment normal.      Lab Results  Component Value Date   WBC 12.2 (H) 01/14/2023   HGB 9.2 (  L) 01/14/2023   HCT 29.2 (L) 01/14/2023   MCV 86.9 01/14/2023   PLT 180 01/14/2023   Lab Results  Component Value Date   FERRITIN 425 (H) 11/11/2022   IRON 34 01/05/2023   TIBC 174 (L) 01/05/2023   UIBC 140 01/05/2023   IRONPCTSAT 20 01/05/2023   Lab Results  Component Value Date   RETICCTPCT 2.0 01/05/2023   RBC 3.36 (L) 01/14/2023   Lab Results  Component Value Date   KAPLAMBRATIO 4.99 10/20/2020   Lab Results  Component Value Date   IGGSERUM 714 10/19/2020   IGA 153 10/19/2020   IGMSERUM 76 10/19/2020   Lab Results  Component Value Date   TOTALPROTELP 6.1 10/19/2020     Chemistry      Component Value Date/Time   NA 131 (L) 01/14/2023 1500   K 3.9 01/14/2023 1500   CL 94 (L) 01/14/2023 1500   CO2 23 01/14/2023 1500   BUN 17 01/14/2023 1500   CREATININE 0.48 01/14/2023 1500      Component Value Date/Time   CALCIUM 8.1 (L) 01/14/2023 1500   ALKPHOS 360 (H) 01/14/2023 1500   AST 88 (H) 01/14/2023 1500   ALT 91 (H) 01/14/2023 1500   BILITOT 0.5 01/14/2023 1500       Impression and Plan: Ms. Enke is a very pleasant 62 yo caucasian female with metastatic breast cancer confined at this time to her bones.  She was diagnosed back in December 2021.   She really has done nicely with antiestrogen therapy.  However, we are at a point now where they are going to have to utilize chemotherapy.  We will try her on Adriamycin/Cytoxan.  I think this would be effective.  I would think that the chance of seeing tumor regression should be 75-80%.  I suspect that she probably will need to have Neulasta to  help with transient neutropenia.  Will see about getting started next week.  I really need to get started quickly so we can try to get a response to try to help with her pain with her quality of life.  I probably would give her 3 cycles of treatment and then repeat her scans.  We will have to watch out for her anemia.  Again I suspect that we may have to transfuse her.  We had a transfusion while she was in the hospital.  I am sure this is all secondary to her malignancy involving the bone marrow.  Volanda Napoleon, MD 4/4/20244:41 PM

## 2023-01-14 NOTE — Progress Notes (Signed)
START OFF PATHWAY REGIMEN - Breast   [id number] (Doxorubicin IV + Cyclophosphamide IV) q21 Days:   A cycle is every 21 days:     Doxorubicin      Cyclophosphamide   **Always confirm dose/schedule in your pharmacy ordering system**  Patient Characteristics: Distant Metastases or Locoregional Recurrent Disease - Unresected, M0 or Locally Advanced Unresectable Disease Progressing after Neoadjuvant and Local Therapies, M0, HER2 Low/Negative, ER Positive, Chemotherapy, HER2 Low, First Line Therapeutic Status: Distant Metastases HER2 Status: Low ER Status: Positive (+) PR Status: Positive (+) Therapy Approach Indicated: Standard Chemotherapy/Endocrine Therapy Line of Therapy: First Line Intent of Therapy: Non-Curative / Palliative Intent, Discussed with Patient

## 2023-01-15 ENCOUNTER — Other Ambulatory Visit: Payer: Self-pay

## 2023-01-15 LAB — IRON AND IRON BINDING CAPACITY (CC-WL,HP ONLY)
Iron: 61 ug/dL (ref 28–170)
Saturation Ratios: 35 % — ABNORMAL HIGH (ref 10.4–31.8)
TIBC: 175 ug/dL — ABNORMAL LOW (ref 250–450)
UIBC: 114 ug/dL — ABNORMAL LOW (ref 148–442)

## 2023-01-15 LAB — CANCER ANTIGEN 27.29: CA 27.29: 110.5 U/mL — ABNORMAL HIGH (ref 0.0–38.6)

## 2023-01-15 NOTE — Progress Notes (Unsigned)
Pharmacist Chemotherapy Monitoring - Initial Assessment    Anticipated start date: 01/21/23  The following has been reviewed per standard work regarding the patient's treatment regimen: The patient's diagnosis, treatment plan and drug doses, and organ/hematologic function Lab orders and baseline tests specific to treatment regimen  The treatment plan start date, drug sequencing, and pre-medications Prior authorization status  Patient's documented medication list, including drug-drug interaction screen and prescriptions for anti-emetics and supportive care specific to the treatment regimen The drug concentrations, fluid compatibility, administration routes, and timing of the medications to be used The patient's access for treatment and lifetime cumulative dose history, if applicable  The patient's medication allergies and previous infusion related reactions, if applicable   Changes made to treatment plan:  treatment plan date  Follow up needed:  Pending authorization for treatment    Selena Swaminathan, Nevin Bloodgood, [facility], 01/15/2023  11:39 AM

## 2023-01-16 ENCOUNTER — Other Ambulatory Visit: Payer: Self-pay

## 2023-01-18 ENCOUNTER — Encounter: Payer: Self-pay | Admitting: *Deleted

## 2023-01-18 DIAGNOSIS — C50919 Malignant neoplasm of unspecified site of unspecified female breast: Secondary | ICD-10-CM

## 2023-01-19 ENCOUNTER — Encounter: Payer: Self-pay | Admitting: Hematology & Oncology

## 2023-01-19 ENCOUNTER — Encounter: Payer: Self-pay | Admitting: *Deleted

## 2023-01-19 DIAGNOSIS — C50919 Malignant neoplasm of unspecified site of unspecified female breast: Secondary | ICD-10-CM | POA: Diagnosis not present

## 2023-01-19 MED ORDER — MEGESTROL ACETATE 625 MG/5ML PO SUSP
625.0000 mg | Freq: Two times a day (BID) | ORAL | 0 refills | Status: DC
Start: 2023-01-19 — End: 2023-02-11

## 2023-01-19 NOTE — Progress Notes (Signed)
Per plan, Marinol was denied, despite appeal containing letter of recommendation.  Dr. Myna Hidalgo made aware.

## 2023-01-20 MED FILL — Dexamethasone Sodium Phosphate Inj 100 MG/10ML: INTRAMUSCULAR | Qty: 1 | Status: AC

## 2023-01-20 MED FILL — Fosaprepitant Dimeglumine For IV Infusion 150 MG (Base Eq): INTRAVENOUS | Qty: 5 | Status: AC

## 2023-01-20 NOTE — Progress Notes (Signed)
Palliative Medicine [facility]  [phone]) [phone] [phone]) [phone]   Name: Annette James Date: 01/20/2023 MRN: [id number]  DOB: 1961/05/24  Patient Care Team: Etta Grandchild, MD as PCP - General (Internal Medicine) Etta Grandchild, MD as Consulting Physician (Internal Medicine) Myna Hidalgo Rose Phi, MD as Medical Oncologist (Oncology)    REASON FOR CONSULTATION: Annette James is a 62 y.o. 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  Palliative ask to see for symptom and pain management and goals of care.    SOCIAL HISTORY:     reports that she has never smoked. She has never used smokeless tobacco. She reports that she does not currently use alcohol. She reports that she does not currently use drugs.  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 Antony Blackbird   History of radiation therapy    Thoracic Spine 07/01/22-07/14/22- Dr. Antony Blackbird   Metastatic cancer to spine 10/19/2020    PAST SURGICAL HISTORY:  Past Surgical History:  Procedure Laterality Date   APPENDECTOMY     CESAREAN SECTION     x 4   CHOLECYSTECTOMY     IR IMAGING GUIDED PORT INSERTION  01/06/2023   IR US GUIDE BX ASP/DRAIN  01/06/2023    HEMATOLOGY/ONCOLOGY HISTORY:  Oncology History  Breast cancer metastasized to bone, unspecified laterality   11/01/2020 Initial Diagnosis   Breast cancer metastasized to bone, unspecified laterality (HCC)   11/01/2020 Cancer Staging   Staging form: Breast, AJCC 8th Edition - Clinical stage from 11/01/2020: Stage IV (cT2, cNX, pM1, G2, ER+, PR+, HER2-) - Signed by Josph Macho, MD on 11/01/2020   01/21/2023 -  Chemotherapy   Patient is on Treatment Plan : BREAST Adjuvant AC q21d       ALLERGIES:  has No Known Allergies.  MEDICATIONS:  Current Outpatient Medications  Medication Sig Dispense Refill   ascorbic acid (VITAMIN C) 500 MG tablet Take 500 mg by mouth daily.     cyclobenzaprine (FLEXERIL) 5 MG tablet TAKE 1 TABLET BY MOUTH THREE TIMES A DAY AS NEEDED FOR MUSCLE SPASM (Patient taking differently: Take 5 mg by mouth 3 (three) times daily as needed for muscle spasms.) 30 tablet 2   dronabinol (MARINOL) 5 MG capsule Take 1 capsule (5 mg total) by mouth 2 (two) times daily before a meal. 60 capsule 0   fentaNYL (DURAGESIC) 50 MCG/HR Place 1 patch onto the skin every other day. 15 patch 0   glimepiride (AMARYL) 2 MG tablet Take 1 tablet (2 mg total) by mouth daily with breakfast. 30 tablet 4   megestrol (MEGACE ES) 625 MG/5ML suspension Take 5 mLs (625 mg total) by mouth 2 (two) times daily. 150 mL 0   Multiple Vitamins-Minerals (ONE-A-DAY WOMENS 50 PLUS) TABS Take 1 tablet by mouth daily.  ondansetron (ZOFRAN) 8 MG tablet Take 1 tablet (8 mg total) by mouth every 8 (eight) hours as needed for nausea or vomiting. 90 tablet 1   oxyCODONE (OXY IR/ROXICODONE) 5 MG immediate release tablet Take 1-2 tablets (5-10 mg total) by mouth every 4 (four) hours as needed. 120 tablet 0   polyethylene glycol (MIRALAX) 17 g packet Take 17 g by mouth daily as needed. 14 each 0   prochlorperazine (COMPAZINE) 10 MG tablet Take 1 tablet (10 mg total) by mouth every 6 (six) hours as needed for nausea or vomiting. 30 tablet 0   senna (SENOKOT) 8.6 MG TABS tablet Take 1 tablet (8.6 mg total) by mouth daily as  needed for mild constipation. 30 tablet 0   No current facility-administered medications for this visit.    VITAL SIGNS: There were no vitals taken for this visit. There were no vitals filed for this visit.  Estimated body mass index is 24.18 kg/m as calculated from the following:   Height as of 01/03/23:  (1.727 m).   Weight as of 01/03/23: 159 lb (72.1 kg).  LABS: CBC:    Component Value Date/Time   WBC 12.2 (H) 01/14/2023 1500   WBC 10.5 01/08/2023 0349   HGB 9.2 (L) 01/14/2023 1500   HCT 29.2 (L) 01/14/2023 1500   PLT 180 01/14/2023 1500   MCV 86.9 01/14/2023 1500   NEUTROABS 10.4 (H) 01/14/2023 1500   LYMPHSABS 0.5 (L) 01/14/2023 1500   MONOABS 0.7 01/14/2023 1500   EOSABS 0.0 01/14/2023 1500   BASOSABS 0.0 01/14/2023 1500   Comprehensive Metabolic Panel:    Component Value Date/Time   NA 131 (L) 01/14/2023 1500   K 3.9 01/14/2023 1500   CL 94 (L) 01/14/2023 1500   CO2 23 01/14/2023 1500   BUN 17 01/14/2023 1500   CREATININE 0.48 01/14/2023 1500   GLUCOSE 117 (H) 01/14/2023 1500   CALCIUM 8.1 (L) 01/14/2023 1500   AST 88 (H) 01/14/2023 1500   ALT 91 (H) 01/14/2023 1500   ALKPHOS 360 (H) 01/14/2023 1500   BILITOT 0.5 01/14/2023 1500   PROT 5.5 (L) 01/14/2023 1500   ALBUMIN 2.1 (L) 01/14/2023 1500    RADIOGRAPHIC STUDIES: NM Bone Scan Whole Body  Result Date: 01/05/2023 CLINICAL DATA:  Metastatic breast cancer. Assess treatment response. EXAM: NUCLEAR MEDICINE WHOLE BODY BONE SCAN TECHNIQUE: Whole body anterior and posterior images were obtained approximately 3 hours after intravenous injection of radiopharmaceutical. RADIOPHARMACEUTICALS:  21.8. mCi Technetium-24m MDP IV COMPARISON:  CT chest, abdomen and pelvis from 01/03/2023 and PET-CT from 06/08/2022. FINDINGS: Multifocal abnormal areas of increased radiotracer uptake are identified throughout the axial and appendicular skeleton compatible with diffuse osseous metastasis. Increased radiotracer uptake is  identified within the bilateral frontal bones, bilateral posterior ribs, sternum, and midthoracic spine. Increased uptake within the L4-5 vertebra also noted. There is also increased radiotracer uptake within both sides of the sacrum, and bilateral anterior iliac bones. Tracer uptake within the humeral heads is also noted bilaterally. Compared with the FDG PET from 06/08/2022 the extent, and distribution of bone metastasis appears similar. Normal physiologic tracer uptake seen within the kidneys an urinary bladder. IMPRESSION: 1. Compared with the FDG PET from 06/08/2022 there is a similar appearance of widespread osseous metastasis involving the axial and appendicular skeleton. Electronically Signed   By: Signa Kell M.D.   On: 01/05/2023 15:26   MR LIVER W WO CONTRAST  Result Date: 01/05/2023 CLINICAL DATA:  History of breast cancer. Evaluate for liver  metastases. Elevated LFTs. EXAM: MRI ABDOMEN WITHOUT AND WITH CONTRAST TECHNIQUE: Multiplanar multisequence MR imaging of the abdomen was performed both before and after the administration of intravenous contrast. CONTRAST:  7mL GADAVIST GADOBUTROL 1 MMOL/ML IV SOLN COMPARISON:  CT chest, abdomen and pelvis from 01/03/2023 and PET-CT from 09/11/2022 FINDINGS: Lower chest: Small left pleural effusion identified. Hepatobiliary: Numerous T2 hyperintense and T1 hypointense lesions are identified throughout the liver. These exhibit typical rim enhancement seen with liver metastases. These lesions appear new when compared with previous imaging prior to 09/11/2022. Index lesions include: -lesion in segment 4 B measures 1.4 x 1.2 cm, image 67/1002 -Index lesion within segment 7 measures 1.4 x 0.9 cm, image 39/1002. -lesion in segment 6 measures 0.7 x 0.7 cm, image 95/1002 The gallbladder appears surgically absent. There is increase caliber of the common bile duct which measures up to 9 mm. No choledocholithiasis identified. Pancreas: No signs of pancreatic  inflammation or mass. Mild increase caliber of the main pancreatic duct measures upper limits of normal at 3 mm, image 23/3. Spleen:  Within normal limits in size and appearance. Adrenals/Urinary Tract: Normal adrenal glands. No nephrolithiasis, hydronephrosis or suspicious mass. Stomach/Bowel: Stomach appears within normal limits. No dilated bowel loops identified. No bowel wall thickening or inflammation noted. Vascular/Lymphatic: Normal caliber of the abdominal aorta. No abdominal adenopathy. Other:  No ascites or focal fluid collections. Musculoskeletal: Mild diffuse body wall edema suggesting anasarca. The bone marrow is diffusely abnormal compatible with diffuse osseous metastasis. IMPRESSION: 1. Numerous liver metastases are identified which appear new when compared with previous imaging prior to 09/11/2022 2. Diffuse osseous metastasis. 3. Mild increase caliber of the common bile duct and main pancreatic duct. No choledocholithiasis or obstructing mass identified. 4. Small left pleural effusion. 5. Mild diffuse body wall edema suggesting anasarca. Electronically Signed   By: Signa Kell M.D.   On: 01/05/2023 09:41   CT CHEST ABDOMEN PELVIS W CONTRAST  Result Date: 01/03/2023 CLINICAL DATA:  Cancer-related pain. Currently on chemotherapy for metastatic breast cancer. EXAM: CT CHEST, ABDOMEN, AND PELVIS WITH CONTRAST TECHNIQUE: Multidetector CT imaging of the chest, abdomen and pelvis was performed following the standard protocol during bolus administration of intravenous contrast. RADIATION DOSE REDUCTION: This exam was performed according to the departmental dose-optimization program which includes automated exposure control, adjustment of the mA and/or kV according to patient size and/or use of iterative reconstruction technique. CONTRAST:  75mL OMNIPAQUE IOHEXOL 350 MG/ML SOLN COMPARISON:  PET-CT 09/11/2022. CT chest abdomen and pelvis 12/16/2020 FINDINGS: CT CHEST FINDINGS Cardiovascular: Normal  heart size. No pericardial effusions. Normal caliber thoracic aorta. No aortic dissection. Great vessel origins are patent. Mediastinum/Nodes: Thyroid gland is unremarkable. Esophagus is decompressed. No significant lymphadenopathy in the chest. Lungs/Pleura: Lungs are clear. No pulmonary nodules, consolidation, or airspace disease. Small left pleural effusion. The effusion is new since the prior PET-CT. Musculoskeletal: Diffuse skeletal metastasis throughout the visualized bones with mixed lucency and sclerosis. Old rib fractures. New since the prior study, there is soft tissue lesion with low-attenuation anterior to the mid sternum. This could represent soft tissue tumor with tumor necrosis or an abscess. This is new since the prior PET-CT. CT ABDOMEN PELVIS FINDINGS Hepatobiliary: Several vague low-attenuation lesions demonstrated in the liver. Most prominent is in the anterior segment of the right lobe of the liver, measuring 1.7 cm diameter. This lesion was not seen on prior studies and no uptake was shown on the PET-CT. This may represent developing hepatic metastasis. Gallbladder is surgically absent.  No bile duct dilatation. Pancreas: Unremarkable. No pancreatic ductal dilatation or surrounding inflammatory changes. Spleen: Normal in size without focal abnormality. Adrenals/Urinary Tract: No adrenal gland nodules. Kidneys are symmetrical with homogeneous nephrograms. No hydronephrosis or hydroureter. Bladder is unremarkable. Stomach/Bowel: Stomach, small bowel, and colon are not abnormally distended. No wall thickening or inflammatory changes are appreciated. Appendix is surgically absent. Vascular/Lymphatic: No significant vascular findings are present. No enlarged abdominal or pelvic lymph nodes. Reproductive: Uterus is not enlarged. Cyst in the left ovary measuring 4.6 x 3.5 cm. This is unchanged since prior study. Other: Small amount of free fluid in the upper abdomen. No free air. Abdominal wall  musculature appears intact. Soft tissue edema in the subcutaneous fat. Musculoskeletal: Diffuse skeletal metastasis throughout the visualized bones with mixed lucent and sclerotic change. IMPRESSION: 1. Diffuse skeletal metastasis. 2. New since prior study, there is low-attenuation soft tissue lesion anterior to the sternum possibly representing soft tissue tumor or abscess. 3. New since the prior study are a small left pleural effusion and small amount of free fluid in the upper abdomen. 4. New since the prior study are vague low-attenuation lesions in the liver possibly representing developing metastasis. 5. Unchanged appearance of 4.6 cm left ovarian cyst. Due to the stability of this lesion, no additional imaging follow-up is recommended. Note: This recommendation does not apply to premenarchal patients and to those with increased risk (genetic, family history, elevated tumor markers or other high-risk factors) of ovarian cancer. Reference: JACR 2020 Feb; 17(2):248-254 Electronically Signed   By: Burman Nieves M.D.   On: 01/03/2023 20:15    PERFORMANCE STATUS (ECOG) : 1 - Symptomatic but completely ambulatory  Review of Systems  Constitutional:  Positive for appetite change and fatigue.  Musculoskeletal:  Positive for arthralgias.  Unless otherwise noted, a complete review of systems is negative.  Physical Exam General: NAD Cardiovascular: regular rate and rhythm Pulmonary:normal breathing pattern Abdomen: soft, nontender, + bowel sounds Extremities: no edema, no joint deformities Skin: no rashes Neurological:AAO x4  IMPRESSION: This is my initial visit at the [facility] with Ms. Lage. She was initially seen by our team during recent hospitalization. No acute distress noted. I saw her during her infusion. Daughter is present. Patient is alert and able to engage appropriately during discussions.   I introduced myself, Maygan RN, and Palliative's role in  collaboration with the oncology team. Concept of Palliative Care was introduced as specialized medical care for people and their families living with serious illness.  It focuses on providing relief from the symptoms and stress of a serious illness.  The goal is to improve quality of life for both the patient and the family. Values and goals of care important to patient and family were attempted to be elicited.   Ms. Tibbett lives at home. Good family support from her daughters and sister. Able to perform most ADLs independently. Does endorse some fatigue and discomfort causing her to take rest breaks or limit activity.   Neoplasm related pain Ms. Landgren reports her pain is currently controlled for the most part. Some days are better than others. It has been somewhat of a challenge since discharge. States pain seemed better controlled while hospitalized although she is aware that IV meds were available for the breakthrough. I assured her the medical team would continue to closely monitor and adjust medications accordingly with a focus of better relief and preventing hospitalization. She and daughter verbalized understanding and appreciation.   We discussed her  current regimen at length: Fentanyl 50 mcg patch, Oxycodone 5-10mg  as needed every 4 hours for breakthrough, and flexeril as needed for spasms. Since being home pain has reached 7-8 out of 10. Nothing specific to escalate pain it intensifies with no regard to activity or positioning. We discussed goals of getting pain to a more manageable state and around 4-5 for short term goal. She has not been taking her oxycodone around the clock. We discussed ways to determine if 1 or 2 tablets should be taken. Lupita LeashDonna would like to continue with current regimen for now and closely monitor.   She knows to contact office as needed.   Constipation Controlled with stool softeners. She has Senna on hand at home. Education provided on the importance of bowel  regimen in the setting of opioid use.   Goals of Care We discussed her current illness and what it means in the larger context of her on-going co-morbidities. Natural disease trajectory and expectations were discussed.  Ms. Iverson AlaminSharples and her daughter are realistic in their understanding. She is clear in expressed goals to continue to treat aggressively allowing her every opportunity to continue to thrive. She is hopeful for a response to newer treatment.   I discussed the importance of continued conversation with family and their medical providers regarding overall plan of care and treatment options, ensuring decisions are within the context of the patients values and GOCs.  PLAN: Established therapeutic relationship. Education provided on palliative's role in collaboration with their Oncology/Radiation team. Ongoing support Fentanyl 50mcg patch Oxycodone 5-10mg  every 4 hours as needed for breakthrough Flexeril as needed for spasms I will plan to see patient back in 2-3 weeks in collaboration to other oncology appointments.    Patient expressed understanding and was in agreement with this plan. She also understands that She can call the clinic at any time with any questions, concerns, or complaints.   Thank you for your referral and allowing Palliative to assist in Mrs. Thalia Partyonna Marie Silman's care.   Number and complexity of problems addressed: HIGH - 1 or more chronic illnesses with SEVERE exacerbation, progression, or side effects of treatment - advanced cancer, pain. Any controlled substances utilized were prescribed in the context of palliative care.   Visit consisted of counseling and education dealing with the complex and emotionally intense issues of symptom management and palliative care in the setting of serious and potentially life-threatening illness.Greater than 50%  of this time was spent counseling and coordinating care related to the above assessment and plan.  Signed  by: Willette AlmaNikki Pickenpack-Cousar, AGPCNP-BC Palliative Medicine Team/[facility]   *Please note that this is a verbal dictation therefore any spelling or grammatical errors are due to the "Dragon Medical One" system interpretation.

## 2023-01-21 ENCOUNTER — Inpatient Hospital Stay: Payer: BC Managed Care – PPO

## 2023-01-21 ENCOUNTER — Inpatient Hospital Stay (HOSPITAL_BASED_OUTPATIENT_CLINIC_OR_DEPARTMENT_OTHER): Payer: BC Managed Care – PPO | Admitting: Nurse Practitioner

## 2023-01-21 ENCOUNTER — Other Ambulatory Visit: Payer: Self-pay | Admitting: *Deleted

## 2023-01-21 VITALS — BP 112/72 | HR 103 | Temp 98.1°F | Resp 17

## 2023-01-21 DIAGNOSIS — C7951 Secondary malignant neoplasm of bone: Secondary | ICD-10-CM

## 2023-01-21 DIAGNOSIS — D539 Nutritional anemia, unspecified: Secondary | ICD-10-CM

## 2023-01-21 DIAGNOSIS — Z5111 Encounter for antineoplastic chemotherapy: Secondary | ICD-10-CM | POA: Diagnosis not present

## 2023-01-21 DIAGNOSIS — C50919 Malignant neoplasm of unspecified site of unspecified female breast: Secondary | ICD-10-CM

## 2023-01-21 DIAGNOSIS — Z7189 Other specified counseling: Secondary | ICD-10-CM | POA: Diagnosis not present

## 2023-01-21 DIAGNOSIS — G893 Neoplasm related pain (acute) (chronic): Secondary | ICD-10-CM | POA: Diagnosis not present

## 2023-01-21 DIAGNOSIS — K5903 Drug induced constipation: Secondary | ICD-10-CM

## 2023-01-21 DIAGNOSIS — D500 Iron deficiency anemia secondary to blood loss (chronic): Secondary | ICD-10-CM

## 2023-01-21 DIAGNOSIS — Z515 Encounter for palliative care: Secondary | ICD-10-CM

## 2023-01-21 DIAGNOSIS — R530 Neoplastic (malignant) related fatigue: Secondary | ICD-10-CM

## 2023-01-21 DIAGNOSIS — Z170 Estrogen receptor positive status [ER+]: Secondary | ICD-10-CM | POA: Diagnosis not present

## 2023-01-21 DIAGNOSIS — D649 Anemia, unspecified: Secondary | ICD-10-CM | POA: Diagnosis not present

## 2023-01-21 LAB — TYPE AND SCREEN
Antibody Screen: NEGATIVE
Unit division: 0

## 2023-01-21 LAB — CMP (CANCER CENTER ONLY)
ALT: 26 U/L (ref 0–44)
AST: 20 U/L (ref 15–41)
Albumin: 3.3 g/dL — ABNORMAL LOW (ref 3.5–5.0)
Alkaline Phosphatase: 380 U/L — ABNORMAL HIGH (ref 38–126)
Anion gap: 8 (ref 5–15)
BUN: 12 mg/dL (ref 6–20)
CO2: 27 mmol/L (ref 22–32)
Calcium: 8.7 mg/dL — ABNORMAL LOW (ref 8.9–10.3)
Chloride: 99 mmol/L (ref 98–111)
Creatinine: 0.44 mg/dL (ref 0.44–1.00)
GFR, Estimated: >60 mL/min (ref >60)
Glucose, Bld: 90 mg/dL (ref 70–99)
Potassium: 4.3 mmol/L (ref 3.5–5.1)
Sodium: 134 mmol/L — ABNORMAL LOW (ref 135–145)
Total Bilirubin: 0.3 mg/dL (ref 0.3–1.2)
Total Protein: 6.4 g/dL — ABNORMAL LOW (ref 6.5–8.1)

## 2023-01-21 LAB — CBC WITH DIFFERENTIAL (CANCER CENTER ONLY)
Abs Immature Granulocytes: 0.43 10*3/uL — ABNORMAL HIGH (ref 0.00–0.07)
Basophils Absolute: 0 10*3/uL (ref 0.0–0.1)
Basophils Relative: 0 %
Eosinophils Absolute: 0.2 10*3/uL (ref 0.0–0.5)
Eosinophils Relative: 3 %
HCT: 27.8 % — ABNORMAL LOW (ref 36.0–46.0)
Hemoglobin: 8.6 g/dL — ABNORMAL LOW (ref 12.0–15.0)
Immature Granulocytes: 6 %
Lymphocytes Relative: 11 %
Lymphs Abs: 0.7 10*3/uL (ref 0.7–4.0)
MCH: 27.7 pg (ref 26.0–34.0)
MCHC: 30.9 g/dL (ref 30.0–36.0)
MCV: 89.4 fL (ref 80.0–100.0)
Monocytes Absolute: 0.5 10*3/uL (ref 0.1–1.0)
Monocytes Relative: 8 %
Neutro Abs: 5 10*3/uL (ref 1.7–7.7)
Neutrophils Relative %: 72 %
Platelet Count: 375 10*3/uL (ref 150–400)
RBC: 3.11 MIL/uL — ABNORMAL LOW (ref 3.87–5.11)
RDW: 18.7 % — ABNORMAL HIGH (ref 11.5–15.5)
Smear Review: NORMAL
WBC Count: 6.9 10*3/uL (ref 4.0–10.5)
nRBC: 0.7 % — ABNORMAL HIGH (ref 0.0–0.2)

## 2023-01-21 LAB — PREPARE RBC (CROSSMATCH)

## 2023-01-21 MED ORDER — LIDOCAINE-PRILOCAINE 2.5-2.5 % EX CREA
TOPICAL_CREAM | CUTANEOUS | 3 refills | Status: DC
Start: 2023-01-21 — End: 2023-03-25

## 2023-01-21 MED ORDER — DEXAMETHASONE 4 MG PO TABS
ORAL_TABLET | ORAL | 1 refills | Status: DC
Start: 2023-01-21 — End: 2023-03-25

## 2023-01-21 MED ORDER — DOXORUBICIN HCL CHEMO IV INJECTION 2 MG/ML
60.0000 mg/m2 | Freq: Once | INTRAVENOUS | Status: AC
  Administered 2023-01-21: 112 mg via INTRAVENOUS
  Filled 2023-01-21: qty 56

## 2023-01-21 MED ORDER — SODIUM CHLORIDE 0.9 % IV SOLN
10.0000 mg | Freq: Once | INTRAVENOUS | Status: AC
  Administered 2023-01-21: 10 mg via INTRAVENOUS
  Filled 2023-01-21: qty 10

## 2023-01-21 MED ORDER — SODIUM CHLORIDE 0.9% FLUSH
10.0000 mL | INTRAVENOUS | Status: DC | PRN
  Administered 2023-01-21: 10 mL

## 2023-01-21 MED ORDER — SODIUM CHLORIDE 0.9 % IV SOLN
150.0000 mg | Freq: Once | INTRAVENOUS | Status: AC
  Administered 2023-01-21: 150 mg via INTRAVENOUS
  Filled 2023-01-21: qty 150

## 2023-01-21 MED ORDER — SODIUM CHLORIDE 0.9 % IV SOLN: Freq: Once | INTRAVENOUS | Status: AC

## 2023-01-21 MED ORDER — PALONOSETRON HCL INJECTION 0.25 MG/5ML
0.2500 mg | Freq: Once | INTRAVENOUS | Status: AC
  Administered 2023-01-21: 0.25 mg via INTRAVENOUS
  Filled 2023-01-21: qty 5

## 2023-01-21 MED ORDER — HEPARIN SOD (PORK) LOCK FLUSH 100 UNIT/ML IV SOLN
500.0000 [IU] | Freq: Once | INTRAVENOUS | Status: AC | PRN
  Administered 2023-01-21: 500 [IU]

## 2023-01-21 MED ORDER — SODIUM CHLORIDE 0.9% FLUSH
10.0000 mL | Freq: Once | INTRAVENOUS | Status: AC
  Administered 2023-01-21: 10 mL

## 2023-01-21 MED ORDER — SODIUM CHLORIDE 0.9 % IV SOLN
600.0000 mg/m2 | Freq: Once | INTRAVENOUS | Status: AC
  Administered 2023-01-21: 1120 mg via INTRAVENOUS
  Filled 2023-01-21: qty 56

## 2023-01-21 NOTE — Patient Instructions (Signed)
[facility]  Discharge Instructions: Thank you for choosing [facility] to provide your oncology and hematology care.   If you have a lab appointment with the Cancer Center, please go directly to the Cancer Center and check in at the registration area.  Wear comfortable clothing and clothing appropriate for easy access to any Portacath or PICC line.   We strive to give you quality time with your provider. You may need to reschedule your appointment if you arrive late (15 or more minutes).  Arriving late affects you and other patients whose appointments are after yours.  Also, if you miss three or more appointments without notifying the office, you may be dismissed from the clinic at the provider's discretion.      For prescription refill requests, have your pharmacy contact our office and allow 72 hours for refills to be completed.    Today you received the following chemotherapy and/or immunotherapy agents Adriamycin and Cytoxan      To help prevent nausea and vomiting after your treatment, we encourage you to take your nausea medication as directed.  BELOW ARE SYMPTOMS THAT SHOULD BE REPORTED IMMEDIATELY: *FEVER GREATER THAN 100.4 F (38 C) OR HIGHER *CHILLS OR SWEATING *NAUSEA AND VOMITING THAT IS NOT CONTROLLED WITH YOUR NAUSEA MEDICATION *UNUSUAL SHORTNESS OF BREATH *UNUSUAL BRUISING OR BLEEDING *URINARY PROBLEMS (pain or burning when urinating, or frequent urination) *BOWEL PROBLEMS (unusual diarrhea, constipation, pain near the anus) TENDERNESS IN MOUTH AND THROAT WITH OR WITHOUT PRESENCE OF ULCERS (sore throat, sores in mouth, or a toothache) UNUSUAL RASH, SWELLING OR PAIN  UNUSUAL VAGINAL DISCHARGE OR ITCHING   Items with * indicate a potential emergency and should be followed up as soon as possible or go to the Emergency Department if any problems should occur.  Please show the CHEMOTHERAPY ALERT CARD or IMMUNOTHERAPY ALERT  CARD at check-in to the Emergency Department and triage nurse. Should you have questions after your visit or need to cancel or reschedule your appointment, please contact [facility] [facility]  [phone] and follow the prompts.  Office hours are 8:00 a.m. to 4:30 p.m. Monday - Friday. Please note that voicemails left after 4:00 p.m. may not be returned until the following business day.  We are closed weekends and major holidays. You have access to a nurse at all times for urgent questions. Please call the main number to the clinic [phone] and follow the prompts.  For any non-urgent questions, you may also contact your provider using MyChart. We now offer e-Visits for anyone 62 and older to request care online for non-urgent symptoms. For details visit [email or web address].   Also download the MyChart app! Go to the app store, search "MyChart", open the app, select [place], and log in with your MyChart username and password.

## 2023-01-21 NOTE — Patient Instructions (Signed)

## 2023-01-22 ENCOUNTER — Other Ambulatory Visit: Payer: Self-pay | Admitting: Hematology & Oncology

## 2023-01-22 ENCOUNTER — Inpatient Hospital Stay: Payer: BC Managed Care – PPO

## 2023-01-22 ENCOUNTER — Other Ambulatory Visit (HOSPITAL_BASED_OUTPATIENT_CLINIC_OR_DEPARTMENT_OTHER): Payer: Self-pay

## 2023-01-22 ENCOUNTER — Encounter: Payer: Self-pay | Admitting: Hematology & Oncology

## 2023-01-22 DIAGNOSIS — C50919 Malignant neoplasm of unspecified site of unspecified female breast: Secondary | ICD-10-CM

## 2023-01-22 DIAGNOSIS — D500 Iron deficiency anemia secondary to blood loss (chronic): Secondary | ICD-10-CM

## 2023-01-22 DIAGNOSIS — Z170 Estrogen receptor positive status [ER+]: Secondary | ICD-10-CM | POA: Diagnosis not present

## 2023-01-22 DIAGNOSIS — R112 Nausea with vomiting, unspecified: Secondary | ICD-10-CM

## 2023-01-22 DIAGNOSIS — D649 Anemia, unspecified: Secondary | ICD-10-CM | POA: Diagnosis not present

## 2023-01-22 DIAGNOSIS — C7951 Secondary malignant neoplasm of bone: Secondary | ICD-10-CM | POA: Diagnosis not present

## 2023-01-22 DIAGNOSIS — D539 Nutritional anemia, unspecified: Secondary | ICD-10-CM

## 2023-01-22 DIAGNOSIS — Z5111 Encounter for antineoplastic chemotherapy: Secondary | ICD-10-CM | POA: Diagnosis not present

## 2023-01-22 LAB — TYPE AND SCREEN

## 2023-01-22 MED ORDER — DIPHENHYDRAMINE HCL 25 MG PO CAPS
25.0000 mg | ORAL_CAPSULE | Freq: Once | ORAL | Status: AC
  Administered 2023-01-22: 25 mg via ORAL
  Filled 2023-01-22: qty 1

## 2023-01-22 MED ORDER — SODIUM CHLORIDE 0.9% FLUSH
10.0000 mL | INTRAVENOUS | Status: AC | PRN
  Administered 2023-01-22: 10 mL

## 2023-01-22 MED ORDER — HEPARIN SOD (PORK) LOCK FLUSH 100 UNIT/ML IV SOLN
500.0000 [IU] | Freq: Every day | INTRAVENOUS | Status: AC | PRN
  Administered 2023-01-22: 500 [IU]

## 2023-01-22 MED ORDER — FUROSEMIDE 10 MG/ML IJ SOLN
20.0000 mg | Freq: Once | INTRAMUSCULAR | Status: AC
  Administered 2023-01-22: 20 mg via INTRAVENOUS
  Filled 2023-01-22: qty 4

## 2023-01-22 MED ORDER — ONDANSETRON HCL 8 MG PO TABS
8.0000 mg | ORAL_TABLET | Freq: Three times a day (TID) | ORAL | 1 refills | Status: DC | PRN
  Filled 2023-01-22: qty 9, 3d supply, fill #0
  Filled 2023-02-18 – 2023-02-19 (×2): qty 9, 3d supply, fill #1
  Filled 2023-03-31: qty 9, 3d supply, fill #2
  Filled 2023-05-06 (×2): qty 9, 3d supply, fill #3

## 2023-01-22 MED ORDER — SODIUM CHLORIDE 0.9% IV SOLUTION
250.0000 mL | Freq: Once | INTRAVENOUS | Status: AC
  Administered 2023-01-22: 250 mL via INTRAVENOUS

## 2023-01-22 MED ORDER — ACETAMINOPHEN 325 MG PO TABS
650.0000 mg | ORAL_TABLET | Freq: Once | ORAL | Status: AC
  Administered 2023-01-22: 650 mg via ORAL
  Filled 2023-01-22: qty 2

## 2023-01-23 LAB — TYPE AND SCREEN: ABO/RH(D): O POS

## 2023-01-23 LAB — BPAM RBC
Blood Product Expiration Date: 202405132359
ISSUE DATE / TIME: 202404120714
Unit Type and Rh: 5100

## 2023-01-29 ENCOUNTER — Telehealth: Payer: Self-pay | Admitting: Nurse Practitioner

## 2023-02-01 NOTE — Progress Notes (Signed)
[employer] [place] papers completed and faxed with confirmation received.   FMLA source papers completed and faxed with confirmation received.

## 2023-02-05 ENCOUNTER — Other Ambulatory Visit: Payer: Self-pay | Admitting: Hematology & Oncology

## 2023-02-05 DIAGNOSIS — C50919 Malignant neoplasm of unspecified site of unspecified female breast: Secondary | ICD-10-CM

## 2023-02-08 ENCOUNTER — Encounter: Payer: Self-pay | Admitting: Hematology & Oncology

## 2023-02-08 MED ORDER — OXYCODONE HCL 5 MG PO TABS
5.0000 mg | ORAL_TABLET | ORAL | 0 refills | Status: DC | PRN
Start: 2023-02-08 — End: 2023-02-22

## 2023-02-08 NOTE — Telephone Encounter (Signed)
Last refilled 01/14/23 #120 to take q4h PRN. Please advise for refill, thanks!

## 2023-02-09 ENCOUNTER — Inpatient Hospital Stay: Payer: BC Managed Care – PPO

## 2023-02-09 ENCOUNTER — Encounter: Payer: Self-pay | Admitting: Hematology & Oncology

## 2023-02-09 ENCOUNTER — Inpatient Hospital Stay (HOSPITAL_BASED_OUTPATIENT_CLINIC_OR_DEPARTMENT_OTHER): Payer: BC Managed Care – PPO | Admitting: Hematology & Oncology

## 2023-02-09 ENCOUNTER — Other Ambulatory Visit: Payer: Self-pay

## 2023-02-09 VITALS — BP 134/82 | HR 108 | Temp 98.1°F | Resp 18 | Ht 68.0 in | Wt 154.0 lb

## 2023-02-09 DIAGNOSIS — C50919 Malignant neoplasm of unspecified site of unspecified female breast: Secondary | ICD-10-CM | POA: Diagnosis not present

## 2023-02-09 DIAGNOSIS — C7951 Secondary malignant neoplasm of bone: Secondary | ICD-10-CM

## 2023-02-09 DIAGNOSIS — Z170 Estrogen receptor positive status [ER+]: Secondary | ICD-10-CM | POA: Diagnosis not present

## 2023-02-09 DIAGNOSIS — D649 Anemia, unspecified: Secondary | ICD-10-CM | POA: Diagnosis not present

## 2023-02-09 DIAGNOSIS — Z5111 Encounter for antineoplastic chemotherapy: Secondary | ICD-10-CM | POA: Diagnosis not present

## 2023-02-09 LAB — CBC WITH DIFFERENTIAL (CANCER CENTER ONLY)
Abs Immature Granulocytes: 2.56 10*3/uL — ABNORMAL HIGH (ref 0.00–0.07)
Basophils Absolute: 0.1 10*3/uL (ref 0.0–0.1)
Basophils Relative: 0 %
Eosinophils Absolute: 0 10*3/uL (ref 0.0–0.5)
Eosinophils Relative: 0 %
HCT: 27.9 % — ABNORMAL LOW (ref 36.0–46.0)
Hemoglobin: 8.7 g/dL — ABNORMAL LOW (ref 12.0–15.0)
Immature Granulocytes: 22 %
Lymphocytes Relative: 5 %
Lymphs Abs: 0.6 10*3/uL — ABNORMAL LOW (ref 0.7–4.0)
MCH: 28.6 pg (ref 26.0–34.0)
MCHC: 31.2 g/dL (ref 30.0–36.0)
MCV: 91.8 fL (ref 80.0–100.0)
Monocytes Absolute: 1.5 10*3/uL — ABNORMAL HIGH (ref 0.1–1.0)
Monocytes Relative: 13 %
Neutro Abs: 7.1 10*3/uL (ref 1.7–7.7)
Neutrophils Relative %: 60 %
Platelet Count: 261 10*3/uL (ref 150–400)
RBC: 3.04 MIL/uL — ABNORMAL LOW (ref 3.87–5.11)
RDW: 20.6 % — ABNORMAL HIGH (ref 11.5–15.5)
Smear Review: NORMAL
WBC Count: 11.8 10*3/uL — ABNORMAL HIGH (ref 4.0–10.5)
nRBC: 3.5 % — ABNORMAL HIGH (ref 0.0–0.2)

## 2023-02-09 LAB — CMP (CANCER CENTER ONLY)
ALT: 6 U/L (ref 0–44)
AST: 12 U/L — ABNORMAL LOW (ref 15–41)
Albumin: 3.5 g/dL (ref 3.5–5.0)
Alkaline Phosphatase: 284 U/L — ABNORMAL HIGH (ref 38–126)
Anion gap: 10 (ref 5–15)
BUN: 9 mg/dL (ref 6–20)
CO2: 22 mmol/L (ref 22–32)
Calcium: 8.5 mg/dL — ABNORMAL LOW (ref 8.9–10.3)
Chloride: 107 mmol/L (ref 98–111)
Creatinine: 0.53 mg/dL (ref 0.44–1.00)
GFR, Estimated: >60 mL/min (ref >60)
Glucose, Bld: 101 mg/dL — ABNORMAL HIGH (ref 70–99)
Potassium: 3.7 mmol/L (ref 3.5–5.1)
Sodium: 139 mmol/L (ref 135–145)
Total Bilirubin: 0.3 mg/dL (ref 0.3–1.2)
Total Protein: 5.4 g/dL — ABNORMAL LOW (ref 6.5–8.1)

## 2023-02-09 LAB — SAMPLE TO BLOOD BANK

## 2023-02-09 LAB — LACTATE DEHYDROGENASE: LDH: 222 U/L — ABNORMAL HIGH (ref 98–192)

## 2023-02-09 MED ORDER — ZOLEDRONIC ACID 4 MG/100ML IV SOLN
4.0000 mg | Freq: Once | INTRAVENOUS | Status: AC
  Administered 2023-02-09: 4 mg via INTRAVENOUS
  Filled 2023-02-09: qty 100

## 2023-02-09 MED ORDER — FULVESTRANT 250 MG/5ML IM SOSY
500.0000 mg | PREFILLED_SYRINGE | Freq: Once | INTRAMUSCULAR | Status: AC
  Administered 2023-02-09: 500 mg via INTRAMUSCULAR
  Filled 2023-02-09: qty 10

## 2023-02-09 MED ORDER — SODIUM CHLORIDE 0.9% FLUSH
10.0000 mL | INTRAVENOUS | Status: DC | PRN
  Administered 2023-02-09: 10 mL

## 2023-02-09 MED ORDER — SODIUM CHLORIDE 0.9 % IV SOLN
10.0000 mg | Freq: Once | INTRAVENOUS | Status: AC
  Administered 2023-02-09: 10 mg via INTRAVENOUS
  Filled 2023-02-09: qty 10

## 2023-02-09 MED ORDER — SODIUM CHLORIDE 0.9 % IV SOLN: Freq: Once | INTRAVENOUS | Status: AC

## 2023-02-09 MED ORDER — SODIUM CHLORIDE 0.9 % IV SOLN
600.0000 mg/m2 | Freq: Once | INTRAVENOUS | Status: AC
  Administered 2023-02-09: 1120 mg via INTRAVENOUS
  Filled 2023-02-09: qty 56

## 2023-02-09 MED ORDER — HEPARIN SOD (PORK) LOCK FLUSH 100 UNIT/ML IV SOLN
500.0000 [IU] | Freq: Once | INTRAVENOUS | Status: AC | PRN
  Administered 2023-02-09: 500 [IU]

## 2023-02-09 MED ORDER — SODIUM CHLORIDE 0.9 % IV SOLN
150.0000 mg | Freq: Once | INTRAVENOUS | Status: AC
  Administered 2023-02-09: 150 mg via INTRAVENOUS
  Filled 2023-02-09: qty 150

## 2023-02-09 MED ORDER — DOXORUBICIN HCL CHEMO IV INJECTION 2 MG/ML
60.0000 mg/m2 | Freq: Once | INTRAVENOUS | Status: AC
  Administered 2023-02-09: 112 mg via INTRAVENOUS
  Filled 2023-02-09: qty 56

## 2023-02-09 MED ORDER — PALONOSETRON HCL INJECTION 0.25 MG/5ML
0.2500 mg | Freq: Once | INTRAVENOUS | Status: AC
  Administered 2023-02-09: 0.25 mg via INTRAVENOUS
  Filled 2023-02-09: qty 5

## 2023-02-09 NOTE — Progress Notes (Signed)
Hematology and Oncology Follow Up Visit  Annette James [id number] Oct 11, 1961 62 y.o. 02/09/2023   Principle Diagnosis:  Metastatic breast cancer-bone only metastasis-ER positive/PR positive/HER2 LOW (1+)///  PIK3CA (+) -- progressive   Current Therapy:        Faslodex 500 mg IM monthly-start on 11/06/2020 Ibrance 125 MG PO Q DAY (21 days on/7 days off) - d/c on 08/12/2022 XRT to the RIGHT hip  --completed on 07/24/2021 Zometa 4 mg IV q. 3 months -- next dose on 02/2023 XRT to T12 -- completed on 07/14/2022 Piqray 300 mg po q day -- start on 08/24/2022 -- d/c on 01/03/2023 Adria/Cytoxan -- s/p cycle #1 -- start on 01/19/2023    Interim History:  Annette James is here today for follow-up.  She tolerated her first cycle of chemotherapy pretty well.  She really did not have much in the way of nausea or vomiting until this past week.  We did have to transfuse her.  Her hemoglobin was 8.6.  Today, her hemoglobin is 8.7.  Her last CA 27.29 was 110.  She has had no cough.  There is been no bleeding.  She has had no change in bowel or bladder habits.  Pain wise she has been doing quite well.  She does see Palliative Care to help with this.  I think she sees them tomorrow.  She has had no fever.  She has had no mouth sores.  She has had no headache.  There is been no leg swelling.  We have her on Megace to try to help with her appetite.  Her weight is up so I am glad about this.  Overall, I would have said that her performance status is probably ECOG 1.   Medications:  Allergies as of 02/09/2023   No Known Allergies      Medication List        Accurate as of February 09, 2023  9:58 AM. If you have any questions, ask your nurse or doctor.          STOP taking these medications    dronabinol 5 MG capsule Commonly known as: MARINOL Stopped by: Josph Macho, MD   glimepiride 2 MG tablet Commonly known as: Amaryl Stopped by: Josph Macho, MD       TAKE these  medications    ascorbic acid 500 MG tablet Commonly known as: VITAMIN C Take 500 mg by mouth daily.   cyclobenzaprine 5 MG tablet Commonly known as: FLEXERIL TAKE 1 TABLET BY MOUTH THREE TIMES A DAY AS NEEDED FOR MUSCLE SPASM   dexamethasone 4 MG tablet Commonly known as: DECADRON Take 2 tablets (8 mg) by mouth daily for 3 days starting the day after chemotherapy. Take with food.   fentaNYL 50 MCG/HR Commonly known as: DURAGESIC Place onto the skin every other day.   lidocaine-prilocaine cream Commonly known as: EMLA Apply to affected area once   megestrol 625 MG/5ML suspension Commonly known as: MEGACE ES Take 5 mLs (625 mg total) by mouth 2 (two) times daily.   ondansetron 8 MG tablet Commonly known as: Zofran Take 1 tablet (8 mg total) by mouth every 8 (eight) hours as needed for nausea or vomiting.   One-A-Day Womens 50 Plus Tabs Take 1 tablet by mouth daily.   oxyCODONE 5 MG immediate release tablet Commonly known as: Oxy IR/ROXICODONE Take 1-2 tablets (5-10 mg total) by mouth every 4 (four) hours as needed.   polyethylene glycol 17 g packet Commonly known  as: MiraLax Take 17 g by mouth daily as needed.   prochlorperazine 10 MG tablet Commonly known as: COMPAZINE Take 1 tablet (10 mg total) by mouth every 6 (six) hours as needed for nausea or vomiting.        Allergies: No Known Allergies  Past Medical History, Surgical history, Social history, and Family History were reviewed and updated.  Review of Systems: Review of Systems  Constitutional: Negative.   HENT: Negative.    Eyes: Negative.   Respiratory: Negative.    Cardiovascular: Negative.   Gastrointestinal: Negative.   Genitourinary: Negative.   Musculoskeletal:  Positive for joint pain and neck pain.  Skin: Negative.   Neurological: Negative.   Endo/Heme/Allergies: Negative.   Psychiatric/Behavioral: Negative.        Physical Exam:  Her vital signs show a temperature of 98.9.  Pulse  115.  Blood pressure 126/81.  Weight was 150 pounds.  Wt Readings from Last 3 Encounters:  02/09/23 154 lb (69.9 kg)  01/03/23 159 lb (72.1 kg)  12/09/22 159 lb (72.1 kg)    Physical Exam Vitals reviewed.  HENT:     Head: Normocephalic and atraumatic.  Eyes:     Pupils: Pupils are equal, round, and reactive to light.  Cardiovascular:     Rate and Rhythm: Normal rate and regular rhythm.     Heart sounds: Normal heart sounds.  Pulmonary:     Effort: Pulmonary effort is normal.     Breath sounds: Normal breath sounds.  Abdominal:     General: Bowel sounds are normal.     Palpations: Abdomen is soft.  Musculoskeletal:        General: No tenderness or deformity. Normal range of motion.     Cervical back: Normal range of motion.  Lymphadenopathy:     Cervical: No cervical adenopathy.  Skin:    General: Skin is warm and dry.     Findings: No erythema or rash.  Neurological:     Mental Status: She is alert and oriented to person, place, and time.  Psychiatric:        Behavior: Behavior normal.        Thought Content: Thought content normal.        Judgment: Judgment normal.      Lab Results  Component Value Date   WBC 11.8 (H) 02/09/2023   HGB 8.7 (L) 02/09/2023   HCT 27.9 (L) 02/09/2023   MCV 91.8 02/09/2023   PLT 261 02/09/2023   Lab Results  Component Value Date   FERRITIN 2,963 (H) 01/14/2023   IRON 61 01/14/2023   TIBC 175 (L) 01/14/2023   UIBC 114 (L) 01/14/2023   IRONPCTSAT 35 (H) 01/14/2023   Lab Results  Component Value Date   RETICCTPCT 2.0 01/05/2023   RBC 3.04 (L) 02/09/2023   Lab Results  Component Value Date   KAPLAMBRATIO 4.99 10/20/2020   Lab Results  Component Value Date   IGGSERUM 714 10/19/2020   IGA 153 10/19/2020   IGMSERUM 76 10/19/2020   Lab Results  Component Value Date   TOTALPROTELP 6.1 10/19/2020     Chemistry      Component Value Date/Time   NA 139 02/09/2023 0845   K 3.7 02/09/2023 0845   CL 107 02/09/2023 0845    CO2 22 02/09/2023 0845   BUN 9 02/09/2023 0845   CREATININE 0.53 02/09/2023 0845      Component Value Date/Time   CALCIUM 8.5 (L) 02/09/2023 0845   ALKPHOS 284 (H)  02/09/2023 0845   AST 12 (L) 02/09/2023 0845   ALT 6 02/09/2023 0845   BILITOT 0.3 02/09/2023 0845       Impression and Plan: Annette James is a very pleasant 62 yo caucasian female with metastatic breast cancer confined at this time to her bones.  She was diagnosed back in December 2021.   She really has done nicely with antiestrogen therapy.  However, we are at a point now where they are going to have to utilize chemotherapy.  We have her on Adriamycin/Cytoxan.  We will see what her CA 27.29 is.  Hopefully, this will be down.  I think a good measure of response to go to be her hemoglobin.  She has bone marrow involvement by her underlying malignancy.  If we can get malignancy out of her bone marrow, her hemoglobin should improve nicely.  I probably had to have her come back in about 10 days or so so that we can see how her blood counts look.  Again, the CA 27.29 will give [place] a good measure of response.  We will get her back in 3 weeks for her third cycle of treatment.  Josph Macho, MD 4/30/20249:58 AM

## 2023-02-09 NOTE — Progress Notes (Unsigned)
Palliative Medicine [facility]  Telephone:([phone]) [phone] Fax:(336) [phone]   Name: Annette James Date: 02/09/2023 MRN: [id number]  DOB: 08-Feb-1961  Patient Care Team: Etta Grandchild, MD as PCP - General (Internal Medicine) Etta Grandchild, MD as Consulting Physician (Internal Medicine) Myna Hidalgo Rose Phi, MD as Medical Oncologist (Oncology) Pickenpack-Cousar, Arty Baumgartner, NP as Nurse Practitioner (Nurse Practitioner)    INTERVAL HISTORY: Annette James is a 62 y.o. 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  Palliative ask to see for symptom and pain management and goals of care.   SOCIAL HISTORY:     reports that she has never smoked. She has never used smokeless tobacco. She reports that she does not currently use alcohol. She reports that she does not currently use drugs.  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 Antony Blackbird   History of radiation therapy    Thoracic Spine 07/01/22-07/14/22- Dr. Antony Blackbird   Metastatic cancer to spine [facility]) 10/19/2020    ALLERGIES:  has No Known Allergies.  MEDICATIONS:  Current Outpatient Medications  Medication Sig Dispense Refill   ascorbic acid (VITAMIN C) 500 MG tablet Take 500 mg by mouth daily.     cyclobenzaprine (FLEXERIL) 5 MG tablet TAKE 1 TABLET BY MOUTH THREE TIMES  A DAY AS NEEDED FOR MUSCLE SPASM 30 tablet 2   dexamethasone (DECADRON) 4 MG tablet Take 2 tablets (8 mg) by mouth daily for 3 days starting the day after chemotherapy. Take with food. 30 tablet 1   fentaNYL (DURAGESIC) 50 MCG/HR Place onto the skin every other day.     lidocaine-prilocaine (EMLA) cream Apply to affected area once 30 g 3   megestrol (MEGACE ES) 625 MG/5ML suspension Take 5 mLs (625 mg total) by mouth 2 (two) times daily. 150 mL 0   Multiple Vitamins-Minerals (ONE-A-DAY WOMENS 50 PLUS) TABS Take 1 tablet by mouth daily.     ondansetron (ZOFRAN) 8 MG tablet Take 1 tablet (8 mg total) by mouth every 8 (eight) hours as needed for nausea or vomiting. (Patient not taking: Reported on 02/09/2023) 90 tablet 1   oxyCODONE (OXY IR/ROXICODONE) 5 MG immediate release tablet Take 1-2 tablets (5-10 mg total) by mouth every 4 (four) hours as needed. 120 tablet 0   polyethylene glycol (MIRALAX) 17 g packet Take 17 g by mouth daily as needed. (Patient not taking: Reported on 02/09/2023) 14 each 0   prochlorperazine (COMPAZINE) 10 MG tablet Take 1 tablet (10 mg total) by mouth every 6 (six) hours as needed for nausea or vomiting. (Patient not taking: Reported on 02/09/2023) 30 tablet 0   No current facility-administered medications for this visit.    VITAL SIGNS: There were no vitals taken for this visit. There were no vitals filed for this visit.  Estimated body mass index is  23.42 kg/m as calculated from the following:   Height as of 02/09/23: 5\' 8"  (1.727 m).   Weight as of 02/09/23: 154 lb (69.9 kg).   PERFORMANCE STATUS (ECOG) : 1 - Symptomatic but completely ambulatory   Physical Exam General: NAD Cardiovascular: regular rate and rhythm Pulmonary: normal breathing pattern  Abdomen: soft, nontender, + bowel sounds Extremities: no edema, no joint deformities Skin: no rashes Neurological: AAO x4  IMPRESSION: Annette James presents to clinic today for symptom management follow-up. No  acute distress noted. She was seen by Dr. Myna Hidalgo on yesterday and had treatment. Her daughter is present. Patient is trying to remain as active as possible. Taking things one day at a time. Understandable some fatigue around treatment days.   She lives in [place]. Patient would like to connect with SW to discuss financial resources such as gas card etc. [place], RN has reached out to [place], SW for assistance.    Neoplasm related pain Annette James reports her pain is well controlled on current regimen. Some days are better than others. Shares increase in pain yesterday and today which she contributes to treatment. We discussed continuing to carefully keeping track of activities or scenarios that increases her pain. This will allow for adjustment to activities and aid in any medication adjustments that may be needed.    We discussed her current regimen at length: Fentanyl 50 mcg patch, Oxycodone 5-10mg  as needed every 4 hours for breakthrough, and flexeril as needed for spasms. Annette James would like to continue with current regimen for now and closely monitor.    She knows to contact office as needed.    Constipation Controlled with stool softeners. She has Senna on hand at home. Education provided on the importance of bowel regimen in the setting of opioid use.    Goals of Care 4/11- We discussed her current illness and what it means in the larger context of her on-going co-morbidities. Natural disease trajectory and expectations were discussed.   Annette James and her daughter are realistic in their understanding. She is clear in expressed goals to continue to treat aggressively allowing her every opportunity to continue to thrive. She is hopeful for a response to newer treatment.  I discussed the importance of continued conversation with family and their medical providers regarding overall plan of care and treatment options, ensuring decisions are within the context of the patients values and  GOCs.  PLAN: Ongoing support Fentanyl patch Oxycodone 5-10mg  every 4 hours as needed for breakthrough Flexeril as needed for spasms CSW referral for support and financial support  I will plan to see patient back in 2-3 weeks in collaboration to other oncology appointments.    Patient expressed understanding and was in agreement with this plan. She also understands that She can call the clinic at any time with any questions, concerns, or complaints.   Any controlled substances utilized were prescribed in the context of palliative care. PDMP has been reviewed.    Visit consisted of counseling and education dealing with the complex and emotionally intense issues of symptom management and palliative care in the setting of serious and potentially life-threatening illness.Greater than 50%  of this time was spent counseling and coordinating care related to the above assessment and plan.  Willette Alma, AGPCNP-BC  Palliative Medicine Team/ Cancer Center  *Please note that this is a verbal dictation therefore any spelling or grammatical errors are due to the "Dragon Medical One" system interpretation.

## 2023-02-09 NOTE — Patient Instructions (Addendum)
[facility]  Discharge Instructions: Thank you for choosing [facility] to provide your oncology and hematology care.   If you have a lab appointment with the Cancer Center, please go directly to the Cancer Center and check in at the registration area.  Wear comfortable clothing and clothing appropriate for easy access to any Portacath or PICC line.   We strive to give you quality time with your provider. You may need to reschedule your appointment if you arrive late (15 or more minutes).  Arriving late affects you and other patients whose appointments are after yours.  Also, if you miss three or more appointments without notifying the office, you may be dismissed from the clinic at the provider's discretion.      For prescription refill requests, have your pharmacy contact our office and allow 72 hours for refills to be completed.    Today you received the following chemotherapy and/or immunotherapy agents Adriamycin, Cytoxan, Zometa   To help prevent nausea and vomiting after your treatment, we encourage you to take your nausea medication as directed.  BELOW ARE SYMPTOMS THAT SHOULD BE REPORTED IMMEDIATELY: *FEVER GREATER THAN 100.4 F (38 C) OR HIGHER *CHILLS OR SWEATING *NAUSEA AND VOMITING THAT IS NOT CONTROLLED WITH YOUR NAUSEA MEDICATION *UNUSUAL SHORTNESS OF BREATH *UNUSUAL BRUISING OR BLEEDING *URINARY PROBLEMS (pain or burning when urinating, or frequent urination) *BOWEL PROBLEMS (unusual diarrhea, constipation, pain near the anus) TENDERNESS IN MOUTH AND THROAT WITH OR WITHOUT PRESENCE OF ULCERS (sore throat, sores in mouth, or a toothache) UNUSUAL RASH, SWELLING OR PAIN  UNUSUAL VAGINAL DISCHARGE OR ITCHING   Items with * indicate a potential emergency and should be followed up as soon as possible or go to the Emergency Department if any problems should occur.  Please show the CHEMOTHERAPY ALERT CARD or IMMUNOTHERAPY ALERT  CARD at check-in to the Emergency Department and triage nurse. Should you have questions after your visit or need to cancel or reschedule your appointment, please contact [facility]  [phone] and follow the prompts.  Office hours are 8:00 a.m. to 4:30 p.m. Monday - Friday. Please note that voicemails left after 4:00 p.m. may not be returned until the following business day.  We are closed weekends and major holidays. You have access to a nurse at all times for urgent questions. Please call the main number to the clinic [phone] and follow the prompts.  For any non-urgent questions, you may also contact your provider using MyChart. We now offer e-Visits for anyone 51 and older to request care online for non-urgent symptoms. For details visit [email or web address].   Also download the MyChart app! Go to the app store, search "MyChart", open the app, select [place], and log in with your MyChart username and password.

## 2023-02-09 NOTE — Progress Notes (Signed)
Corrrected Ca today = 8.9.  Anola Gurney [place], [place], BCPS, BCOP 02/09/2023 11:02 AM

## 2023-02-10 ENCOUNTER — Telehealth: Payer: Self-pay | Admitting: *Deleted

## 2023-02-10 ENCOUNTER — Inpatient Hospital Stay: Payer: BC Managed Care – PPO | Admitting: Licensed Clinical Social Worker

## 2023-02-10 ENCOUNTER — Inpatient Hospital Stay: Payer: BC Managed Care – PPO | Attending: Hematology & Oncology | Admitting: Nurse Practitioner

## 2023-02-10 ENCOUNTER — Encounter: Payer: Self-pay | Admitting: Nurse Practitioner

## 2023-02-10 VITALS — BP 142/79 | HR 110 | Temp 98.0°F | Resp 16 | Ht 68.0 in | Wt 158.1 lb

## 2023-02-10 DIAGNOSIS — Z5111 Encounter for antineoplastic chemotherapy: Secondary | ICD-10-CM | POA: Insufficient documentation

## 2023-02-10 DIAGNOSIS — C50919 Malignant neoplasm of unspecified site of unspecified female breast: Secondary | ICD-10-CM | POA: Diagnosis not present

## 2023-02-10 DIAGNOSIS — G893 Neoplasm related pain (acute) (chronic): Secondary | ICD-10-CM

## 2023-02-10 DIAGNOSIS — Z79811 Long term (current) use of aromatase inhibitors: Secondary | ICD-10-CM | POA: Insufficient documentation

## 2023-02-10 DIAGNOSIS — C7951 Secondary malignant neoplasm of bone: Secondary | ICD-10-CM | POA: Diagnosis not present

## 2023-02-10 DIAGNOSIS — Z515 Encounter for palliative care: Secondary | ICD-10-CM | POA: Diagnosis not present

## 2023-02-10 DIAGNOSIS — R530 Neoplastic (malignant) related fatigue: Secondary | ICD-10-CM

## 2023-02-10 DIAGNOSIS — K5900 Constipation, unspecified: Secondary | ICD-10-CM | POA: Insufficient documentation

## 2023-02-10 DIAGNOSIS — R978 Other abnormal tumor markers: Secondary | ICD-10-CM | POA: Insufficient documentation

## 2023-02-10 DIAGNOSIS — Z170 Estrogen receptor positive status [ER+]: Secondary | ICD-10-CM | POA: Insufficient documentation

## 2023-02-10 LAB — CANCER ANTIGEN 27.29: CA 27.29: 110.9 U/mL — ABNORMAL HIGH (ref 0.0–38.6)

## 2023-02-10 NOTE — Telephone Encounter (Signed)
-----   Message from Josph Macho, MD sent at 02/10/2023  3:42 PM EDT ----- Call and let her know that the CA 27.29 is holding steady at 111.  This actually is pretty good.  Thanks.  Cindee Lame

## 2023-02-10 NOTE — Progress Notes (Signed)
[facility] Clinical Social Work  Initial Assessment   Ira Busbin is a 62 y.o. year old female contacted by phone. Clinical Social Work was referred by nurse for assessment of psychosocial needs.   SDOH (Social Determinants of Health) assessments performed: Yes SDOH Interventions    Flowsheet Row Clinical Support from 02/10/2023 in [facility]  SDOH Interventions   Food Insecurity Interventions Other (Comment)  Housing Interventions Intervention Not Indicated  Transportation Interventions Other (Comment)  Utilities Interventions Intervention Not Indicated  Financial Strain Interventions Financial Counselor       SDOH Screenings   Food Insecurity: Food Insecurity Present (02/10/2023)  Housing: Low Risk  (02/10/2023)  Transportation Needs: No Transportation Needs (02/10/2023)  Utilities: Not At Risk (02/10/2023)  Financial Resource Strain: Medium Risk (02/10/2023)  Tobacco Use: Low Risk  (02/10/2023)     Distress Screen completed: No    10/30/2021    1:05 PM  ONCBCN DISTRESS SCREENING  Screening Type Initial Screening  Distress experienced in past week (1-10) 0  Emotional problem type Adjusting to illness      Family/Social Information:  Housing Arrangement: patient lives with her two daughters. Family members/support persons in your life? Family, Friends, and [employer].  Patient has two other daughters in the areas, as well as, a sister. Transportation concerns: no, however, she is concerned about the cost of gas for her treatments.  Employment: Disabled through her employer.  Income source: Short-Term Disability Financial concerns: Yes, due to illness and/or loss of work during treatment Type of concernEngineer, manufacturing, Medical bills, and Food Food access concerns: It is becoming an issue since her income decreased due to disability. Religious or spiritual practice: Yes-through her sister's church. Services Currently in place:   BCBS  Coping/ Adjustment to diagnosis: Patient understands treatment plan and what happens next? yes Concerns about diagnosis and/or treatment: Losing my job and/or losing income Patient reported stressors: Adjusting to my illness Hopes and/or priorities: Family. Patient enjoys crafts and reading Current coping skills/ strengths: Ability for insight , Average or above average intelligence , Capable of independent living , Communication skills , General fund of knowledge , Motivation for treatment/growth , and Supportive family/friends     SUMMARY: Current SDOH Barriers:  Financial constraints related to decreased income.  Clinical Social Work Clinical Goal(s):  Explore community resource options for unmet needs related to:  Financial Strain   Interventions: Discussed common feeling and emotions when being diagnosed with cancer, and the importance of support during treatment Informed patient of the support team roles and support services at [facility] Provided CSW contact information and encouraged patient to call with any questions or concerns Provided patient with information about the [employer] and made referral.  Also discussed the [employer]  CSW to leave four cards with Amber at the [facility] next Wednesday, 5/8, for patient.  CSW to mail program brochure and [facility] and Wellness information.   Follow Up Plan: CSW will follow-up with patient by phone  Patient verbalizes understanding of plan: Yes    Dorothey Baseman, LCSW Clinical Social Worker [facility]

## 2023-02-11 ENCOUNTER — Inpatient Hospital Stay: Payer: BC Managed Care – PPO

## 2023-02-11 ENCOUNTER — Other Ambulatory Visit: Payer: Self-pay | Admitting: Hematology & Oncology

## 2023-02-11 ENCOUNTER — Telehealth: Payer: Self-pay | Admitting: *Deleted

## 2023-02-11 DIAGNOSIS — C50919 Malignant neoplasm of unspecified site of unspecified female breast: Secondary | ICD-10-CM

## 2023-02-11 NOTE — Telephone Encounter (Signed)
Per staff message Annette James - called patient and lvm for a callback to schedule nutrition follow up.

## 2023-02-12 ENCOUNTER — Other Ambulatory Visit: Payer: Self-pay | Admitting: Internal Medicine

## 2023-02-12 ENCOUNTER — Other Ambulatory Visit: Payer: Self-pay

## 2023-02-12 MED ORDER — FENTANYL 50 MCG/HR TD PT72: 1.0000 | MEDICATED_PATCH | TRANSDERMAL | 0 refills | Status: DC

## 2023-02-16 ENCOUNTER — Ambulatory Visit: Payer: BC Managed Care – PPO | Admitting: Dietician

## 2023-02-16 NOTE — Progress Notes (Signed)
Nutrition Assessment   Reason for Assessment: MST screen for weight loss.    ASSESSMENT: Patient is a 62 year old female with metastatic breast cancer who has had gradual weight loss past few years.  Called patient at home/cell#. She reports she recently has not had any appetite but is now being more attentive to her nutrient needs and trying to eat more frequently and get more protein in.  She has been eating yogurt each day for breakfast was drinking CIB and was given samples of Ensure Complete at the [facility]. She has 2 daughters that live with her that are helping with meals as well.   Anthropometrics: Recently has started to regain some weight past couple of weeks after taking Megase.  Height: 68" Weight:  02/10/23  158# 02/09/23  154# 01/03/23  159#  BMI: 24.04  NUTRITION DIAGNOSIS: Inadequate PO intake to meet increased nutrient needs with cancer diagnosis r/t anorexia  INTERVENTION:  Relayed that nutrition services are wrap around service provided at no charge and encouraged continued communication if experiencing continued weight loss or any nutritional impact symptoms (NIS). Educated on importance of adequate calorie and protein energy intake  with nutrient dense foods when possible to maintain weight/strength Encouraged continued frequent feeds and trying to eat 6 small meals Encouraged asking for coupons for Ensure products if she enjoys those as oral nutrition supplement, and offered suggestions for more cost effective choices Emailed Nutrition During Cancer Treatment, High protein high calorie snacking, and Virtual Nutrition Class Flyer with contact information provided.   MONITORING, EVALUATION, GOAL: weight, PO intake, Nutrition Impact Symptoms, labs Goal is weight maintenance  Next Visit: PRN at patient or provider request  Gennaro Africa, RDN, LDN Registered Dietitian, [facility] Part Time Remote (Usual office hours: Tuesday-Thursday) Cell: [phone]

## 2023-02-16 NOTE — Progress Notes (Signed)
Spoke with Lilya regarding the [employer], she will not qualify at this time. She is going to call me back if anything changes.

## 2023-02-18 ENCOUNTER — Encounter: Payer: Self-pay | Admitting: *Deleted

## 2023-02-18 ENCOUNTER — Other Ambulatory Visit: Payer: Self-pay | Admitting: *Deleted

## 2023-02-18 DIAGNOSIS — C7951 Secondary malignant neoplasm of bone: Secondary | ICD-10-CM

## 2023-02-18 DIAGNOSIS — C50919 Malignant neoplasm of unspecified site of unspecified female breast: Secondary | ICD-10-CM

## 2023-02-18 DIAGNOSIS — G893 Neoplasm related pain (acute) (chronic): Secondary | ICD-10-CM

## 2023-02-18 DIAGNOSIS — Z515 Encounter for palliative care: Secondary | ICD-10-CM

## 2023-02-19 ENCOUNTER — Other Ambulatory Visit: Payer: Self-pay | Admitting: *Deleted

## 2023-02-19 ENCOUNTER — Other Ambulatory Visit: Payer: Self-pay | Admitting: Pharmacist

## 2023-02-19 ENCOUNTER — Other Ambulatory Visit (HOSPITAL_BASED_OUTPATIENT_CLINIC_OR_DEPARTMENT_OTHER): Payer: Self-pay

## 2023-02-19 ENCOUNTER — Inpatient Hospital Stay: Payer: BC Managed Care – PPO

## 2023-02-19 ENCOUNTER — Telehealth: Payer: Self-pay | Admitting: *Deleted

## 2023-02-19 ENCOUNTER — Other Ambulatory Visit: Payer: BC Managed Care – PPO

## 2023-02-19 DIAGNOSIS — C50919 Malignant neoplasm of unspecified site of unspecified female breast: Secondary | ICD-10-CM

## 2023-02-19 DIAGNOSIS — C7951 Secondary malignant neoplasm of bone: Secondary | ICD-10-CM

## 2023-02-19 DIAGNOSIS — Z5111 Encounter for antineoplastic chemotherapy: Secondary | ICD-10-CM | POA: Diagnosis not present

## 2023-02-19 DIAGNOSIS — G893 Neoplasm related pain (acute) (chronic): Secondary | ICD-10-CM

## 2023-02-19 DIAGNOSIS — Z515 Encounter for palliative care: Secondary | ICD-10-CM

## 2023-02-19 DIAGNOSIS — K5900 Constipation, unspecified: Secondary | ICD-10-CM | POA: Diagnosis not present

## 2023-02-19 DIAGNOSIS — Z170 Estrogen receptor positive status [ER+]: Secondary | ICD-10-CM | POA: Diagnosis not present

## 2023-02-19 DIAGNOSIS — R978 Other abnormal tumor markers: Secondary | ICD-10-CM | POA: Diagnosis not present

## 2023-02-19 DIAGNOSIS — Z79811 Long term (current) use of aromatase inhibitors: Secondary | ICD-10-CM | POA: Diagnosis not present

## 2023-02-19 LAB — CMP (CANCER CENTER ONLY)
ALT: 29 U/L (ref 0–44)
AST: 13 U/L — ABNORMAL LOW (ref 15–41)
Albumin: 3.7 g/dL (ref 3.5–5.0)
Alkaline Phosphatase: 119 U/L (ref 38–126)
Anion gap: 8 (ref 5–15)
BUN: 13 mg/dL (ref 6–20)
CO2: 23 mmol/L (ref 22–32)
Calcium: 8.7 mg/dL — ABNORMAL LOW (ref 8.9–10.3)
Chloride: 106 mmol/L (ref 98–111)
Creatinine: 0.53 mg/dL (ref 0.44–1.00)
GFR, Estimated: >60 mL/min (ref >60)
Glucose, Bld: 97 mg/dL (ref 70–99)
Potassium: 3.9 mmol/L (ref 3.5–5.1)
Sodium: 137 mmol/L (ref 135–145)
Total Bilirubin: 0.3 mg/dL (ref 0.3–1.2)
Total Protein: 5.8 g/dL — ABNORMAL LOW (ref 6.5–8.1)

## 2023-02-19 LAB — CBC WITH DIFFERENTIAL (CANCER CENTER ONLY)
Abs Immature Granulocytes: 0.01 10*3/uL (ref 0.00–0.07)
Basophils Absolute: 0 10*3/uL (ref 0.0–0.1)
Basophils Relative: 2 %
Eosinophils Absolute: 0 10*3/uL (ref 0.0–0.5)
Eosinophils Relative: 2 %
HCT: 23.5 % — ABNORMAL LOW (ref 36.0–46.0)
Hemoglobin: 7.5 g/dL — ABNORMAL LOW (ref 12.0–15.0)
Immature Granulocytes: 2 %
Lymphocytes Relative: 12 %
Lymphs Abs: 0.1 10*3/uL — ABNORMAL LOW (ref 0.7–4.0)
MCH: 29 pg (ref 26.0–34.0)
MCHC: 31.9 g/dL (ref 30.0–36.0)
MCV: 90.7 fL (ref 80.0–100.0)
Monocytes Absolute: 0.1 10*3/uL (ref 0.1–1.0)
Monocytes Relative: 11 %
Neutro Abs: 0.5 10*3/uL — ABNORMAL LOW (ref 1.7–7.7)
Neutrophils Relative %: 71 %
Platelet Count: 140 10*3/uL — ABNORMAL LOW (ref 150–400)
RBC: 2.59 MIL/uL — ABNORMAL LOW (ref 3.87–5.11)
RDW: 20.7 % — ABNORMAL HIGH (ref 11.5–15.5)
Smear Review: NORMAL
WBC Count: 0.7 10*3/uL — CL (ref 4.0–10.5)
nRBC: 4.6 % — ABNORMAL HIGH (ref 0.0–0.2)

## 2023-02-19 LAB — BPAM RBC
Blood Product Expiration Date: 202406092359
ISSUE DATE / TIME: 202405101044

## 2023-02-19 LAB — TYPE AND SCREEN: Unit division: 0

## 2023-02-19 LAB — SAMPLE TO BLOOD BANK

## 2023-02-19 MED ORDER — FUROSEMIDE 10 MG/ML IJ SOLN
20.0000 mg | Freq: Once | INTRAMUSCULAR | Status: DC
  Filled 2023-02-19: qty 4

## 2023-02-19 MED ORDER — ACETAMINOPHEN 325 MG PO TABS
650.0000 mg | ORAL_TABLET | Freq: Once | ORAL | Status: AC
  Administered 2023-02-19: 650 mg via ORAL
  Filled 2023-02-19: qty 2

## 2023-02-19 MED ORDER — SODIUM CHLORIDE 0.9% IV SOLUTION
250.0000 mL | Freq: Once | INTRAVENOUS | Status: AC
  Administered 2023-02-19: 250 mL via INTRAVENOUS

## 2023-02-19 MED ORDER — PEGFILGRASTIM-JMDB 6 MG/0.6ML ~~LOC~~ SOSY
6.0000 mg | PREFILLED_SYRINGE | Freq: Once | SUBCUTANEOUS | Status: AC
  Administered 2023-02-19: 6 mg via SUBCUTANEOUS
  Filled 2023-02-19: qty 0.6

## 2023-02-19 MED ORDER — HYDROMORPHONE HCL 1 MG/ML IJ SOLN
2.0000 mg | Freq: Once | INTRAMUSCULAR | Status: AC
  Administered 2023-02-19: 2 mg via INTRAVENOUS
  Filled 2023-02-19: qty 2

## 2023-02-19 MED ORDER — DIPHENHYDRAMINE HCL 25 MG PO CAPS
25.0000 mg | ORAL_CAPSULE | Freq: Once | ORAL | Status: AC
  Administered 2023-02-19: 25 mg via ORAL
  Filled 2023-02-19: qty 1

## 2023-02-19 NOTE — Telephone Encounter (Signed)
Dr. Myna Hidalgo notified of HGB-7.5 and WBC-0.7.  Orders received for pt to get 2 units of PRBC's and Neulasta today per order of Dr. Myna Hidalgo.

## 2023-02-19 NOTE — Patient Instructions (Signed)
Pegfilgrastim Injection What is this medication? PEGFILGRASTIM (PEG fil gra stim) lowers the risk of infection in people who are receiving chemotherapy. It works by helping your body make more white blood cells, which protects your body from infection. It may also be used to help people who have been exposed to high doses of radiation. This medicine may be used for other purposes; ask your health care provider or pharmacist if you have questions. COMMON BRAND NAME(S): Fulphila, Fylnetra, Neulasta, Nyvepria, Stimufend, UDENYCA, Ziextenzo What should I tell my care team before I take this medication? They need to know if you have any of these conditions: Kidney disease Latex allergy Ongoing radiation therapy Sickle cell disease Skin reactions to acrylic adhesives (On-Body Injector only) An unusual or allergic reaction to pegfilgrastim, filgrastim, other medications, foods, dyes, or preservatives Pregnant or trying to get pregnant Breast-feeding How should I use this medication? This medication is for injection under the skin. If you get this medication at home, you will be taught how to prepare and give the pre-filled syringe or how to use the On-body Injector. Refer to the patient Instructions for Use for detailed instructions. Use exactly as directed. Tell your care team immediately if you suspect that the On-body Injector may not have performed as intended or if you suspect the use of the On-body Injector resulted in a missed or partial dose. It is important that you put your used needles and syringes in a special sharps container. Do not put them in a trash can. If you do not have a sharps container, call your pharmacist or care team to get one. Talk to your care team about the use of this medication in children. While this medication may be prescribed for selected conditions, precautions do apply. Overdosage: If you think you have taken too much of this medicine contact a poison control center  or emergency room at once. NOTE: This medicine is only for you. Do not share this medicine with others. What if I miss a dose? It is important not to miss your dose. Call your care team if you miss your dose. If you miss a dose due to an On-body Injector failure or leakage, a new dose should be administered as soon as possible using a single prefilled syringe for manual use. What may interact with this medication? Interactions have not been studied. This list may not describe all possible interactions. Give your health care provider a list of all the medicines, herbs, non-prescription drugs, or dietary supplements you use. Also tell them if you smoke, drink alcohol, or use illegal drugs. Some items may interact with your medicine. What should I watch for while using this medication? Your condition will be monitored carefully while you are receiving this medication. You may need blood work done while you are taking this medication. Talk to your care team about your risk of cancer. You may be more at risk for certain types of cancer if you take this medication. If you are going to need a MRI, CT scan, or other procedure, tell your care team that you are using this medication (On-Body Injector only). What side effects may I notice from receiving this medication? Side effects that you should report to your care team as soon as possible: Allergic reactions--skin rash, itching, hives, swelling of the face, lips, tongue, or throat Capillary leak syndrome--stomach or muscle pain, unusual weakness or fatigue, feeling faint or lightheaded, decrease in the amount of urine, swelling of the ankles, hands, or feet, trouble   breathing High white blood cell level--fever, fatigue, trouble breathing, night sweats, change in vision, weight loss Inflammation of the aorta--fever, fatigue, back, chest, or stomach pain, severe headache Kidney injury (glomerulonephritis)--decrease in the amount of urine, red or dark brown  urine, foamy or bubbly urine, swelling of the ankles, hands, or feet Shortness of breath or trouble breathing Spleen injury--pain in upper left stomach or shoulder Unusual bruising or bleeding Side effects that usually do not require medical attention (report to your care team if they continue or are bothersome): Bone pain Pain in the hands or feet This list may not describe all possible side effects. Call your doctor for medical advice about side effects. You may report side effects to FDA at 1-800-FDA-1088. Where should I keep my medication? Keep out of the reach of children. If you are using this medication at home, you will be instructed on how to store it. Throw away any unused medication after the expiration date on the label. NOTE: This sheet is a summary. It may not cover all possible information. If you have questions about this medicine, talk to your doctor, pharmacist, or health care provider.  2023 Elsevier/Gold Standard (2021-06-13 00:00:00) Blood Transfusion, Adult A blood transfusion is a procedure in which you receive blood or a type of blood cell (blood component) through an IV. You may need a blood transfusion when you have a low blood count, which is a low number of any blood cell. This may result from a bleeding disorder, illness, injury, or surgery. The blood may come from a donor, or you may be able to have your own blood collected and stored (autologous blood donation) before a planned surgery. The blood given in a transfusion may be made up of different blood components. You may receive: Red blood cells. These carry oxygen to the cells in the body. Platelets. These help your blood to clot. Plasma. This is the liquid part of your blood. It carries proteins and other substances throughout the body. White blood cells. These help you fight infections. If you have hemophilia or another clotting disorder, you may also receive other types of blood products. Depending on the  type of blood product, this procedure may take 1-4 hours to complete. Tell a health care provider about: Any bleeding problems you have. Any previous reactions you have had during a blood transfusion. Any allergies you have. All medicines you are taking, including vitamins, herbs, eye drops, creams, and over-the-counter medicines. Any surgeries you have had. Any medical conditions you have. Whether you are pregnant or may be pregnant. What are the risks? Talk with your health care provider about risks. The most common problems include: A mild allergic reaction, such as red, swollen areas of skin (hives) and itching. Fever or chills. This may be the body's response to new blood cells received. This may occur during or up to 4 hours after the transfusion. More serious problems may include: A serious allergic reaction that causes difficulty breathing or swelling around the face and lips. Transfusion-associated circulatory overload (TACO), or too much fluid in the lungs. This may cause breathing problems. Transfusion-related acute lung injury (TRALI), which causes breathing difficulty and low oxygen in the blood. This can occur within hours of the transfusion or several days later. Iron overload. This can happen after receiving many blood transfusions over a period of time. Infection or virus being transmitted. This is rare because donated blood is carefully tested before it is given. Hemolytic transfusion reaction. This is rare. It  happens when the body's defense system (immune system)tries to attack the new blood cells. Symptoms may include fever, chills, nausea, low blood pressure, and low back or chest pain. Transfusion-associated graft-versus-host disease (TAGVHD). This is rare. It happens when donated cells attack the body's healthy tissues. What happens before the procedure? You will have a blood test to check your blood type. This test is done to know what kind of blood your body will  accept and to match it to the donor blood. If you are going to have a planned surgery, you may be able to do an autologous blood donation. This may be done in case you need to have a transfusion. You will have your temperature, blood pressure, and pulse checked before the transfusion. If you have had an allergic reaction to a transfusion in the past, you may be given medicine to help prevent a reaction. This medicine may be given to you by mouth (orally) or through an IV. What happens during the procedure?  An IV will be inserted into one of your veins. The bag of blood will be attached to your IV. The blood will then enter through your vein. Your temperature, blood pressure, and pulse will be monitored during the transfusion. This monitoring is done to detect early signs of a transfusion reaction. Tell your nurse right away if you have any of these symptoms during the transfusion: Shortness of breath or trouble breathing. Chest or back pain. Fever or chills. Itching or hives. If you have any signs or symptoms of a reaction, your transfusion will be stopped and you may be given medicine. When the transfusion is complete, your IV will be removed. Pressure may be applied to the IV site for a few minutes. A bandage (dressing)will be applied. The procedure may vary among health care providers and hospitals. What happens after the procedure? Your temperature, blood pressure, pulse, breathing rate, and blood oxygen level will be monitored until you leave the hospital or clinic. Your blood may be tested to see how you have responded to the transfusion. You may be warmed with fluids or blankets to maintain a normal body temperature. If you receive your blood transfusion in an outpatient setting, you will be told whom to contact to report any reactions. Where to find more information Visit the American Red Cross: redcross.org Summary A blood transfusion is a procedure in which you receive blood or  a type of blood cell (blood component) through an IV. The blood given in a transfusion may be made up of different blood components. You may receive red blood cells, platelets, plasma, or white blood cells depending on the condition treated. Your temperature, blood pressure, and pulse will be monitored before, during, and after the transfusion. After the transfusion, your blood may be tested to see how your body has responded. This information is not intended to replace advice given to you by your health care provider. Make sure you discuss any questions you have with your health care provider. Document Revised: 12/26/2021 Document Reviewed: 12/26/2021 Elsevier Patient Education  [place].

## 2023-02-19 NOTE — Patient Instructions (Signed)
Implanted [place], Care After The following information offers guidance on how to care for yourself after your procedure. Your health care provider may also give you more specific instructions. If you have problems or questions, contact your health care provider. What can I expect after the procedure? After the procedure, it is common to have: Soreness or pain near your incision. Some swelling or bruising near your incision. Follow these instructions at home: Medicines Take over-the-counter and prescription medicines only as told by your health care provider. If you were prescribed an antibiotic medicine, take it as told by your health care provider. Do not stop taking the antibiotic even if you start to feel better. Bathing Do not take baths, swim, or use a hot tub until your health care provider approves. Ask your health care provider if you can take showers. You may only be allowed to take sponge baths. Incision care  Follow instructions from your health care provider about how to take care of your incision. Make sure you: Wash your hands with soap and water for at least 20 seconds before and after you change your bandage (dressing). If soap and water are not available, use hand sanitizer. Change your dressing as told by your health care provider. Keep your dressing dry. Leave stitches (sutures), skin glue, or adhesive strips in place. These skin closures may need to stay in place for 2 weeks or longer. If adhesive strip edges start to loosen and curl up, you may trim the loose edges. Do not remove adhesive strips completely unless your health care provider tells you to do that. Check your incision area every day for signs of infection. Check for: More redness, swelling, or pain. More fluid or blood. Warmth. Pus or a bad smell. Activity Return to your normal activities as told by your health care provider. Ask your health care provider what activities are safe for you. You may have  to avoid lifting. Ask your health care provider how much you can safely lift. Do not do activities that involve lifting your arms over your head. Driving  If you were given a sedative during the procedure, it can affect you for several hours. Do not drive or operate machinery until your health care provider says that it is safe. If you did not receive a sedative, ask your health care provider when it is safe to drive. General instructions Do not use any products that contain nicotine or tobacco. These products include cigarettes, chewing tobacco, and vaping devices, such as e-cigarettes. These can delay healing after surgery. If you need help quitting, ask your health care provider. Keep all follow-up visits. This is important. Contact a health care provider if: You have a fever or chills. You have more redness, swelling, or pain around your incision. You have more fluid or blood coming from your incision. Your incision feels warm to the touch. You have pus or a bad smell coming from your incision. You have pain that is not relieved by your pain medicine. Get help right away if: You have chest pain. You have difficulty breathing. These symptoms may be an emergency. Get help right away. Call 911. Do not wait to see if the symptoms will go away. Do not drive yourself to the hospital. Summary After the procedure, it is common to have pain, soreness, swelling, or bruising near your incision. If you were prescribed an antibiotic medicine, take it as told by your health care provider. Do not stop taking the antibiotic even if you   start to feel better. If you were given a sedative during the procedure, it can affect you for several hours. Do not drive or operate machinery until your health care provider says that it is safe. Return to your normal activities as told by your health care provider. Ask your health care provider what activities are safe for you. This information is not intended to  replace advice given to you by your health care provider. Make sure you discuss any questions you have with your health care provider. Document Revised: 04/01/2021 Document Reviewed: 04/01/2021 Elsevier Patient Education  2023 Elsevier Inc.  

## 2023-02-21 LAB — BPAM RBC
Blood Product Expiration Date: 202406092359
ISSUE DATE / TIME: 202405101044
Unit Type and Rh: 5100
Unit Type and Rh: 5100

## 2023-02-21 LAB — TYPE AND SCREEN
ABO/RH(D): O POS
Antibody Screen: NEGATIVE
Unit division: 0

## 2023-02-22 ENCOUNTER — Other Ambulatory Visit: Payer: Self-pay | Admitting: Hematology & Oncology

## 2023-02-22 DIAGNOSIS — C50919 Malignant neoplasm of unspecified site of unspecified female breast: Secondary | ICD-10-CM

## 2023-02-22 MED ORDER — OXYCODONE HCL 5 MG PO TABS
5.0000 mg | ORAL_TABLET | ORAL | 0 refills | Status: DC | PRN
Start: 2023-02-22 — End: 2023-03-04

## 2023-02-22 NOTE — Telephone Encounter (Signed)
Last refilled 02/08/23 #120. Please advise for refills, thanks.

## 2023-02-24 ENCOUNTER — Other Ambulatory Visit (HOSPITAL_BASED_OUTPATIENT_CLINIC_OR_DEPARTMENT_OTHER): Payer: Self-pay

## 2023-02-24 ENCOUNTER — Telehealth: Payer: Self-pay | Admitting: Dietician

## 2023-02-24 ENCOUNTER — Encounter: Payer: BC Managed Care – PPO | Admitting: Dietician

## 2023-02-24 NOTE — Telephone Encounter (Signed)
Attempted to reach patient for a scheduled remote nutrition consult. Provided my cell# on voice mail to return call for the follow up nutrition consult.  Gennaro Africa, RDN, LDN Registered Dietitian, [facility] Part Time Remote (Usual office hours: Tuesday-Thursday) Cell: [phone]

## 2023-03-02 NOTE — Progress Notes (Signed)
Palliative Medicine [facility]  [phone] [phone]   Name: Annette James Date: 03/02/2023 MRN: [id number]  DOB: 03-06-1961  Patient Care Team: Etta Grandchild, MD as PCP - General (Internal Medicine) Etta Grandchild, MD as Consulting Physician (Internal Medicine) Josph Macho, MD as Medical Oncologist (Oncology) Pickenpack-Cousar, Arty Baumgartner, NP as Nurse Practitioner (Nurse Practitioner)   I connected with Annette James on 03/02/23 at  2:30 PM EDT by phone and verified that I am speaking with the correct person using two identifiers.   I discussed the limitations, risks, security and privacy concerns of performing an evaluation and management service by telemedicine and the availability of in-person appointments. I also discussed with the patient that there may be a patient responsible charge related to this service. The patient expressed understanding and agreed to proceed.   Other persons participating in the visit and their role in the encounter: n/a   Patient's location: home  Provider's location: [facility]   Chief Complaint: f/u of symptom management   INTERVAL HISTORY: Annette James is a 62 y.o. 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  Palliative ask to see for symptom and pain management and goals of care.   SOCIAL HISTORY:     reports that she has never smoked. She has never used smokeless tobacco. She reports that she does not currently use alcohol. She reports that she does not currently use drugs.  ADVANCE DIRECTIVES:  None on file  CODE STATUS: Full code  PAST MEDICAL HISTORY: Past Medical History:  Diagnosis Date   Breast cancer metastasized to bone, unspecified laterality (HCC) 11/01/2020   radiation txt to spine 11/13/2020-11/29/2020  Dr Roselind Messier   Chicken pox    Class 1 obesity 12/16/2020   Colitis 12/2020   Goals of care,  counseling/discussion 10/19/2020   History of radiation therapy 11/13/2020-11/29/2020   left hip   Dr Antony Blackbird   History of radiation therapy    right hip 07/07/2021-07/24/2021  Dr Antony Blackbird   History of radiation therapy    Right Pelvis(Hip) 11/10/21-11/21/21- Dr. Antony Blackbird   History of radiation therapy    Thoracic Spine 07/01/22-07/14/22- Dr. Antony Blackbird   Metastatic cancer to spine [facility]) 10/19/2020    ALLERGIES:  has No Known Allergies.  MEDICATIONS:  Current Outpatient Medications  Medication Sig Dispense Refill   ascorbic acid (VITAMIN C) 500 MG tablet Take 500 mg by mouth daily.     cyclobenzaprine (FLEXERIL) 5 MG tablet TAKE 1 TABLET BY MOUTH THREE TIMES A DAY AS NEEDED FOR MUSCLE SPASM 30 tablet 2   dexamethasone (DECADRON) 4 MG tablet Take 2 tablets (8 mg) by mouth daily for 3 days starting the day after chemotherapy. Take with food. 30 tablet 1   fentaNYL (DURAGESIC) 50 MCG/HR Place 1 patch onto the skin every other day. 10 patch 0   lidocaine-prilocaine (EMLA) cream Apply to affected area once 30 g 3   megestrol (MEGACE ES) 625 MG/5ML suspension TAKE 5 MLS (625 MG TOTAL) BY MOUTH 2 (TWO) TIMES DAILY. DISCARD ANY REMAINDER 450 mL 1   Multiple Vitamins-Minerals (ONE-A-DAY WOMENS 50 PLUS) TABS Take 1 tablet by mouth daily.     ondansetron (ZOFRAN) 8 MG tablet Take 1 tablet (8 mg total) by mouth every 8 (eight) hours as needed for nausea or vomiting. (Patient not taking: Reported on 02/09/2023) 90 tablet 1   oxyCODONE (  OXY IR/ROXICODONE) 5 MG immediate release tablet Take 1-2 tablets (5-10 mg total) by mouth every 4 (four) hours as needed. 120 tablet 0   polyethylene glycol (MIRALAX) 17 g packet Take 17 g by mouth daily as needed. (Patient not taking: Reported on 02/09/2023) 14 each 0   prochlorperazine (COMPAZINE) 10 MG tablet Take 1 tablet (10 mg total) by mouth every 6 (six) hours as needed for nausea or vomiting. (Patient not taking: Reported on 02/09/2023) 30 tablet 0    No current facility-administered medications for this visit.    VITAL SIGNS: There were no vitals taken for this visit. There were no vitals filed for this visit.  Estimated body mass index is 23.87 kg/m as calculated from the following:   Height as of 02/19/23: 5\' 8"  (1.727 m).   Weight as of 02/19/23: 157 lb (71.2 kg).   PERFORMANCE STATUS (ECOG) : 1 - Symptomatic but completely ambulatory   Physical Exam General: NAD Cardiovascular: regular rate and rhythm Pulmonary: normal breathing pattern  Abdomen: soft, nontender, + bowel sounds Extremities: no edema, no joint deformities Skin: no rashes Neurological: AAO x4  IMPRESSION:  Neoplasm related pain Annette James reports her pain is well controlled on current regimen. Some days are better than others. Shares increase in pain yesterday and today which she contributes to treatment. We discussed continuing to carefully keeping track of activities or scenarios that increases her pain. This will allow for adjustment to activities and aid in any medication adjustments that may be needed.    We discussed her current regimen at length: Fentanyl 50 mcg patch, Oxycodone 5-10mg  as needed every 4 hours for breakthrough, and flexeril as needed for spasms. Annette James would like to continue with current regimen for now and closely monitor.    She knows to contact office as needed.    Constipation Controlled with stool softeners. She has Senna on hand at home. Education provided on the importance of bowel regimen in the setting of opioid use.    Goals of Care 4/11- We discussed her current illness and what it means in the larger context of her on-going co-morbidities. Natural disease trajectory and expectations were discussed.   Annette James and her daughter are realistic in their understanding. She is clear in expressed goals to continue to treat aggressively allowing her every opportunity to continue to thrive. She is hopeful for a response to  newer treatment.  I discussed the importance of continued conversation with family and their medical providers regarding overall plan of care and treatment options, ensuring decisions are within the context of the patients values and GOCs.  PLAN: Ongoing support Fentanyl patch Oxycodone 5-10mg  every 4 hours as needed for breakthrough Flexeril as needed for spasms CSW referral for support and financial support  I will plan to see patient back in 2-3 weeks in collaboration to other oncology appointments.    Patient expressed understanding and was in agreement with this plan. She also understands that She can call the clinic at any time with any questions, concerns, or complaints.   Any controlled substances utilized were prescribed in the context of palliative care. PDMP has been reviewed.    Visit consisted of counseling and education dealing with the complex and emotionally intense issues of symptom management and palliative care in the setting of serious and potentially life-threatening illness.Greater than 50%  of this time was spent counseling and coordinating care related to the above assessment and plan.  Willette Alma, AGPCNP-BC  Palliative Medicine Team/[facility]  *Please note that this is a verbal dictation therefore any spelling or grammatical errors are due to the "Dragon Medical One" system interpretation.

## 2023-03-04 ENCOUNTER — Inpatient Hospital Stay: Payer: BC Managed Care – PPO

## 2023-03-04 ENCOUNTER — Other Ambulatory Visit: Payer: Self-pay

## 2023-03-04 ENCOUNTER — Encounter: Payer: Self-pay | Admitting: Hematology & Oncology

## 2023-03-04 ENCOUNTER — Inpatient Hospital Stay (HOSPITAL_BASED_OUTPATIENT_CLINIC_OR_DEPARTMENT_OTHER): Payer: BC Managed Care – PPO | Admitting: Hematology & Oncology

## 2023-03-04 ENCOUNTER — Other Ambulatory Visit: Payer: Self-pay | Admitting: Hematology & Oncology

## 2023-03-04 ENCOUNTER — Encounter: Payer: Self-pay | Admitting: Nurse Practitioner

## 2023-03-04 ENCOUNTER — Inpatient Hospital Stay (HOSPITAL_BASED_OUTPATIENT_CLINIC_OR_DEPARTMENT_OTHER): Payer: BC Managed Care – PPO | Admitting: Nurse Practitioner

## 2023-03-04 VITALS — BP 137/91 | HR 90

## 2023-03-04 VITALS — BP 132/88 | HR 99 | Temp 99.0°F | Resp 18 | Ht 68.0 in | Wt 155.0 lb

## 2023-03-04 DIAGNOSIS — Z515 Encounter for palliative care: Secondary | ICD-10-CM | POA: Diagnosis not present

## 2023-03-04 DIAGNOSIS — Z5111 Encounter for antineoplastic chemotherapy: Secondary | ICD-10-CM | POA: Diagnosis not present

## 2023-03-04 DIAGNOSIS — K5903 Drug induced constipation: Secondary | ICD-10-CM

## 2023-03-04 DIAGNOSIS — C50919 Malignant neoplasm of unspecified site of unspecified female breast: Secondary | ICD-10-CM | POA: Diagnosis not present

## 2023-03-04 DIAGNOSIS — C7951 Secondary malignant neoplasm of bone: Secondary | ICD-10-CM | POA: Diagnosis not present

## 2023-03-04 DIAGNOSIS — G893 Neoplasm related pain (acute) (chronic): Secondary | ICD-10-CM | POA: Diagnosis not present

## 2023-03-04 DIAGNOSIS — R530 Neoplastic (malignant) related fatigue: Secondary | ICD-10-CM | POA: Diagnosis not present

## 2023-03-04 DIAGNOSIS — K5900 Constipation, unspecified: Secondary | ICD-10-CM | POA: Diagnosis not present

## 2023-03-04 DIAGNOSIS — Z79811 Long term (current) use of aromatase inhibitors: Secondary | ICD-10-CM | POA: Diagnosis not present

## 2023-03-04 DIAGNOSIS — Z170 Estrogen receptor positive status [ER+]: Secondary | ICD-10-CM | POA: Diagnosis not present

## 2023-03-04 DIAGNOSIS — R978 Other abnormal tumor markers: Secondary | ICD-10-CM | POA: Diagnosis not present

## 2023-03-04 LAB — CMP (CANCER CENTER ONLY)
ALT: 31 U/L (ref 0–44)
AST: 25 U/L (ref 15–41)
Albumin: 4.2 g/dL (ref 3.5–5.0)
Alkaline Phosphatase: 153 U/L — ABNORMAL HIGH (ref 38–126)
Anion gap: 8 (ref 5–15)
BUN: 17 mg/dL (ref 6–20)
CO2: 24 mmol/L (ref 22–32)
Calcium: 9.2 mg/dL (ref 8.9–10.3)
Chloride: 105 mmol/L (ref 98–111)
Creatinine: 0.64 mg/dL (ref 0.44–1.00)
GFR, Estimated: >60 mL/min (ref >60)
Glucose, Bld: 105 mg/dL — ABNORMAL HIGH (ref 70–99)
Potassium: 4.1 mmol/L (ref 3.5–5.1)
Sodium: 137 mmol/L (ref 135–145)
Total Bilirubin: 0.4 mg/dL (ref 0.3–1.2)
Total Protein: 6.1 g/dL — ABNORMAL LOW (ref 6.5–8.1)

## 2023-03-04 LAB — CBC WITH DIFFERENTIAL (CANCER CENTER ONLY)
Abs Immature Granulocytes: 5.51 10*3/uL — ABNORMAL HIGH (ref 0.00–0.07)
Basophils Absolute: 0.3 10*3/uL — ABNORMAL HIGH (ref 0.0–0.1)
Basophils Relative: 1 %
Eosinophils Absolute: 0 10*3/uL (ref 0.0–0.5)
Eosinophils Relative: 0 %
HCT: 34.2 % — ABNORMAL LOW (ref 36.0–46.0)
Hemoglobin: 11.1 g/dL — ABNORMAL LOW (ref 12.0–15.0)
Immature Granulocytes: 12 %
Lymphocytes Relative: 1 %
Lymphs Abs: 0.7 10*3/uL (ref 0.7–4.0)
MCH: 29.7 pg (ref 26.0–34.0)
MCHC: 32.5 g/dL (ref 30.0–36.0)
MCV: 91.4 fL (ref 80.0–100.0)
Monocytes Absolute: 2.9 10*3/uL — ABNORMAL HIGH (ref 0.1–1.0)
Monocytes Relative: 6 %
Neutro Abs: 36.5 10*3/uL — ABNORMAL HIGH (ref 1.7–7.7)
Neutrophils Relative %: 80 %
Platelet Count: 158 10*3/uL (ref 150–400)
RBC: 3.74 MIL/uL — ABNORMAL LOW (ref 3.87–5.11)
RDW: 22.5 % — ABNORMAL HIGH (ref 11.5–15.5)
Smear Review: NORMAL
WBC Count: 45.8 10*3/uL — ABNORMAL HIGH (ref 4.0–10.5)
nRBC: 1.3 % — ABNORMAL HIGH (ref 0.0–0.2)

## 2023-03-04 LAB — SAMPLE TO BLOOD BANK

## 2023-03-04 LAB — LACTATE DEHYDROGENASE: LDH: 398 U/L — ABNORMAL HIGH (ref 98–192)

## 2023-03-04 LAB — SAVE SMEAR(SSMR), FOR PROVIDER SLIDE REVIEW

## 2023-03-04 MED ORDER — OXYCODONE HCL 5 MG PO TABS
5.0000 mg | ORAL_TABLET | ORAL | 0 refills | Status: DC | PRN
Start: 2023-03-04 — End: 2023-03-19

## 2023-03-04 MED ORDER — PALONOSETRON HCL INJECTION 0.25 MG/5ML
0.2500 mg | Freq: Once | INTRAVENOUS | Status: AC
  Administered 2023-03-04: 0.25 mg via INTRAVENOUS
  Filled 2023-03-04: qty 5

## 2023-03-04 MED ORDER — SODIUM CHLORIDE 0.9 % IV SOLN
10.0000 mg | Freq: Once | INTRAVENOUS | Status: AC
  Administered 2023-03-04: 10 mg via INTRAVENOUS
  Filled 2023-03-04: qty 10

## 2023-03-04 MED ORDER — SODIUM CHLORIDE 0.9% FLUSH
10.0000 mL | INTRAVENOUS | Status: DC | PRN
  Administered 2023-03-04: 10 mL

## 2023-03-04 MED ORDER — SODIUM CHLORIDE 0.9 % IV SOLN
600.0000 mg/m2 | Freq: Once | INTRAVENOUS | Status: AC
  Administered 2023-03-04: 1120 mg via INTRAVENOUS
  Filled 2023-03-04: qty 56

## 2023-03-04 MED ORDER — HYDROMORPHONE HCL 4 MG/ML IJ SOLN
2.0000 mg | INTRAMUSCULAR | Status: DC | PRN
  Administered 2023-03-04: 2 mg via INTRAVENOUS
  Filled 2023-03-04: qty 1

## 2023-03-04 MED ORDER — DOXORUBICIN HCL CHEMO IV INJECTION 2 MG/ML
60.0000 mg/m2 | Freq: Once | INTRAVENOUS | Status: AC
  Administered 2023-03-04: 112 mg via INTRAVENOUS
  Filled 2023-03-04: qty 56

## 2023-03-04 MED ORDER — HYDROMORPHONE HCL 4 MG/ML IJ SOLN: 2.0000 mg | INTRAMUSCULAR | Status: AC | PRN

## 2023-03-04 MED ORDER — SODIUM CHLORIDE 0.9 % IV SOLN
150.0000 mg | Freq: Once | INTRAVENOUS | Status: AC
  Administered 2023-03-04: 150 mg via INTRAVENOUS
  Filled 2023-03-04: qty 150

## 2023-03-04 MED ORDER — HEPARIN SOD (PORK) LOCK FLUSH 100 UNIT/ML IV SOLN
500.0000 [IU] | Freq: Once | INTRAVENOUS | Status: AC | PRN
  Administered 2023-03-04: 500 [IU]

## 2023-03-04 MED ORDER — SODIUM CHLORIDE 0.9 % IV SOLN: Freq: Once | INTRAVENOUS | Status: AC

## 2023-03-04 NOTE — Progress Notes (Signed)
Hematology and Oncology Follow Up Visit  Annette James [id number] Sep 21, 1961 62 y.o. 03/04/2023   Principle Diagnosis:  Metastatic breast cancer-bone only metastasis-ER positive/PR positive/HER2 LOW (1+)///  PIK3CA (+) -- progressive   Current Therapy:        Faslodex 500 mg IM monthly-start on 11/06/2020 Ibrance 125 MG PO Q DAY (21 days on/7 days off) - d/c on 08/12/2022 XRT to the RIGHT hip  --completed on 07/24/2021 Zometa 4 mg IV q. 3 months -- next dose on 02/2023 XRT to T12 -- completed on 07/14/2022 Piqray 300 mg po q day -- start on 08/24/2022 -- d/c on 01/03/2023 Adria/Cytoxan -- s/p cycle #2 -- start on 01/19/2023    Interim History:  Annette James is here today for follow-up.  She tolerated her first cycle of chemotherapy pretty well.  She did have a very nice Mother's Day weekend.  She still has some pain issues.  She has a pain in her shoulders.  She might be from the G-CSF that we give her.  Her white cell count is 44,000.  Her last CA 27-29 was still 110.  She has had no nausea or vomiting.  She has had no change in bowel or bladder habits.  She has had no leg swelling.  She has had no rashes.  There is been no bleeding.  She is on Megace.  I think this is helping her appetite little bit.  Overall, I would say that her performance status is probably ECOG 1.    Medications:  Allergies as of 03/04/2023   No Known Allergies      Medication List        Accurate as of Mar 04, 2023  8:26 AM. If you have any questions, ask your nurse or doctor.          ascorbic acid 500 MG tablet Commonly known as: VITAMIN C Take 500 mg by mouth daily.   cyclobenzaprine 5 MG tablet Commonly known as: FLEXERIL TAKE 1 TABLET BY MOUTH THREE TIMES A DAY AS NEEDED FOR MUSCLE SPASM   dexamethasone 4 MG tablet Commonly known as: DECADRON Take 2 tablets (8 mg) by mouth daily for 3 days starting the day after chemotherapy. Take with food.   fentaNYL 50 MCG/HR Commonly  known as: DURAGESIC Place 1 patch onto the skin every other day.   lidocaine-prilocaine cream Commonly known as: EMLA Apply to affected area once   megestrol 625 MG/5ML suspension Commonly known as: MEGACE ES TAKE 5 MLS (625 MG TOTAL) BY MOUTH 2 (TWO) TIMES DAILY. DISCARD ANY REMAINDER   ondansetron 8 MG tablet Commonly known as: Zofran Take 1 tablet (8 mg total) by mouth every 8 (eight) hours as needed for nausea or vomiting.   One-A-Day Womens 50 Plus Tabs Take 1 tablet by mouth daily.   oxyCODONE 5 MG immediate release tablet Commonly known as: Oxy IR/ROXICODONE Take 1-2 tablets (5-10 mg total) by mouth every 4 (four) hours as needed.   polyethylene glycol 17 g packet Commonly known as: MiraLax Take 17 g by mouth daily as needed.   prochlorperazine 10 MG tablet Commonly known as: COMPAZINE Take 1 tablet (10 mg total) by mouth every 6 (six) hours as needed for nausea or vomiting.        Allergies: No Known Allergies  Past Medical History, Surgical history, Social history, and Family History were reviewed and updated.  Review of Systems: Review of Systems  Constitutional: Negative.   HENT: Negative.    Eyes:  Negative.   Respiratory: Negative.    Cardiovascular: Negative.   Gastrointestinal: Negative.   Genitourinary: Negative.   Musculoskeletal:  Positive for joint pain and neck pain.  Skin: Negative.   Neurological: Negative.   Endo/Heme/Allergies: Negative.   Psychiatric/Behavioral: Negative.        Physical Exam:  Her vital signs show a temperature of 98.9.  Pulse 115.  Blood pressure 126/81.  Weight was 150 pounds.  Wt Readings from Last 3 Encounters:  02/19/23 157 lb (71.2 kg)  02/10/23 158 lb 1.6 oz (71.7 kg)  02/09/23 154 lb (69.9 kg)    Physical Exam Vitals reviewed.  HENT:     Head: Normocephalic and atraumatic.  Eyes:     Pupils: Pupils are equal, round, and reactive to light.  Cardiovascular:     Rate and Rhythm: Normal rate and  regular rhythm.     Heart sounds: Normal heart sounds.  Pulmonary:     Effort: Pulmonary effort is normal.     Breath sounds: Normal breath sounds.  Abdominal:     General: Bowel sounds are normal.     Palpations: Abdomen is soft.  Musculoskeletal:        General: No tenderness or deformity. Normal range of motion.     Cervical back: Normal range of motion.  Lymphadenopathy:     Cervical: No cervical adenopathy.  Skin:    General: Skin is warm and dry.     Findings: No erythema or rash.  Neurological:     Mental Status: She is alert and oriented to person, place, and time.  Psychiatric:        Behavior: Behavior normal.        Thought Content: Thought content normal.        Judgment: Judgment normal.      Lab Results  Component Value Date   WBC 0.7 (LL) 02/19/2023   HGB 7.5 (L) 02/19/2023   HCT 23.5 (L) 02/19/2023   MCV 90.7 02/19/2023   PLT 140 (L) 02/19/2023   Lab Results  Component Value Date   FERRITIN 2,963 (H) 01/14/2023   IRON 61 01/14/2023   TIBC 175 (L) 01/14/2023   UIBC 114 (L) 01/14/2023   IRONPCTSAT 35 (H) 01/14/2023   Lab Results  Component Value Date   RETICCTPCT 2.0 01/05/2023   RBC 2.59 (L) 02/19/2023   Lab Results  Component Value Date   KAPLAMBRATIO 4.99 10/20/2020   Lab Results  Component Value Date   IGGSERUM 714 10/19/2020   IGA 153 10/19/2020   IGMSERUM 76 10/19/2020   Lab Results  Component Value Date   TOTALPROTELP 6.1 10/19/2020     Chemistry      Component Value Date/Time   NA 137 02/19/2023 0819   K 3.9 02/19/2023 0819   CL 106 02/19/2023 0819   CO2 23 02/19/2023 0819   BUN 13 02/19/2023 0819   CREATININE 0.53 02/19/2023 0819      Component Value Date/Time   CALCIUM 8.7 (L) 02/19/2023 0819   ALKPHOS 119 02/19/2023 0819   AST 13 (L) 02/19/2023 0819   ALT 29 02/19/2023 0819   BILITOT 0.3 02/19/2023 0819       Impression and Plan: Annette James is a very pleasant 63 yo caucasian female with metastatic breast  cancer confined at this time to her bones.  She was diagnosed back in December 2021.   She really has done nicely with antiestrogen therapy.  However, we are at a point now where they are  going to have to utilize chemotherapy.  We have her on Adriamycin/Cytoxan.  We will see what her CA 27.29 is.  Hopefully, this will be down.  I think a good measure of response to go to be her hemoglobin.  She has bone marrow involvement by her underlying malignancy.  If we can get malignancy out of her bone marrow, her hemoglobin should improve nicely.  We will go ahead and get scans set up for after this cycle of treatment.  We will plan to get her back in 3 weeks.    Josph Macho, MD 5/23/20248:26 AM

## 2023-03-04 NOTE — Patient Instructions (Signed)
[facility]  Discharge Instructions: Thank you for choosing [facility] to provide your oncology and hematology care.   If you have a lab appointment with the Cancer Center, please go directly to the Cancer Center and check in at the registration area.  Wear comfortable clothing and clothing appropriate for easy access to any Portacath or PICC line.   We strive to give you quality time with your provider. You may need to reschedule your appointment if you arrive late (15 or more minutes).  Arriving late affects you and other patients whose appointments are after yours.  Also, if you miss three or more appointments without notifying the office, you may be dismissed from the clinic at the provider's discretion.      For prescription refill requests, have your pharmacy contact our office and allow 72 hours for refills to be completed.    Today you received the following chemotherapy and/or immunotherapy agents adria, cytoxan, aloxi, emend, and decadron      To help prevent nausea and vomiting after your treatment, we encourage you to take your nausea medication as directed.  BELOW ARE SYMPTOMS THAT SHOULD BE REPORTED IMMEDIATELY: *FEVER GREATER THAN 100.4 F (38 C) OR HIGHER *CHILLS OR SWEATING *NAUSEA AND VOMITING THAT IS NOT CONTROLLED WITH YOUR NAUSEA MEDICATION *UNUSUAL SHORTNESS OF BREATH *UNUSUAL BRUISING OR BLEEDING *URINARY PROBLEMS (pain or burning when urinating, or frequent urination) *BOWEL PROBLEMS (unusual diarrhea, constipation, pain near the anus) TENDERNESS IN MOUTH AND THROAT WITH OR WITHOUT PRESENCE OF ULCERS (sore throat, sores in mouth, or a toothache) UNUSUAL RASH, SWELLING OR PAIN  UNUSUAL VAGINAL DISCHARGE OR ITCHING   Items with * indicate a potential emergency and should be followed up as soon as possible or go to the Emergency Department if any problems should occur.  Please show the CHEMOTHERAPY ALERT CARD or  IMMUNOTHERAPY ALERT CARD at check-in to the Emergency Department and triage nurse. Should you have questions after your visit or need to cancel or reschedule your appointment, please contact [facility] [place]  [phone] and follow the prompts.  Office hours are 8:00 a.m. to 4:30 p.m. Monday - Friday. Please note that voicemails left after 4:00 p.m. may not be returned until the following business day.  We are closed weekends and major holidays. You have access to a nurse at all times for urgent questions. Please call the main number to the clinic [phone] and follow the prompts.  For any non-urgent questions, you may also contact your provider using MyChart. We now offer e-Visits for anyone 73 and older to request care online for non-urgent symptoms. For details visit [email or web address].   Also download the MyChart app! Go to the app store, search "MyChart", open the app, select [facility], and log in with your MyChart username and password.

## 2023-03-04 NOTE — Patient Instructions (Signed)

## 2023-03-04 NOTE — Addendum Note (Signed)
Addended by: Josph Macho on: 03/04/2023 09:55 AM   Modules accepted: Orders

## 2023-03-05 ENCOUNTER — Other Ambulatory Visit: Payer: Self-pay | Admitting: Hematology & Oncology

## 2023-03-05 DIAGNOSIS — C50919 Malignant neoplasm of unspecified site of unspecified female breast: Secondary | ICD-10-CM

## 2023-03-05 DIAGNOSIS — C7951 Secondary malignant neoplasm of bone: Secondary | ICD-10-CM

## 2023-03-05 LAB — CANCER ANTIGEN 27.29: CA 27.29: 118.7 U/mL — ABNORMAL HIGH (ref 0.0–38.6)

## 2023-03-09 ENCOUNTER — Other Ambulatory Visit: Payer: Self-pay | Admitting: *Deleted

## 2023-03-09 DIAGNOSIS — C50919 Malignant neoplasm of unspecified site of unspecified female breast: Secondary | ICD-10-CM

## 2023-03-09 DIAGNOSIS — C7951 Secondary malignant neoplasm of bone: Secondary | ICD-10-CM

## 2023-03-10 ENCOUNTER — Inpatient Hospital Stay: Payer: BC Managed Care – PPO

## 2023-03-10 VITALS — BP 133/71 | HR 94 | Temp 98.4°F | Resp 18

## 2023-03-10 DIAGNOSIS — Z79811 Long term (current) use of aromatase inhibitors: Secondary | ICD-10-CM | POA: Diagnosis not present

## 2023-03-10 DIAGNOSIS — K5900 Constipation, unspecified: Secondary | ICD-10-CM | POA: Diagnosis not present

## 2023-03-10 DIAGNOSIS — Z170 Estrogen receptor positive status [ER+]: Secondary | ICD-10-CM | POA: Diagnosis not present

## 2023-03-10 DIAGNOSIS — C7951 Secondary malignant neoplasm of bone: Secondary | ICD-10-CM

## 2023-03-10 DIAGNOSIS — Z5111 Encounter for antineoplastic chemotherapy: Secondary | ICD-10-CM | POA: Diagnosis not present

## 2023-03-10 DIAGNOSIS — G893 Neoplasm related pain (acute) (chronic): Secondary | ICD-10-CM | POA: Diagnosis not present

## 2023-03-10 DIAGNOSIS — C50919 Malignant neoplasm of unspecified site of unspecified female breast: Secondary | ICD-10-CM | POA: Diagnosis not present

## 2023-03-10 DIAGNOSIS — R978 Other abnormal tumor markers: Secondary | ICD-10-CM | POA: Diagnosis not present

## 2023-03-10 LAB — CMP (CANCER CENTER ONLY)
ALT: 51 U/L — ABNORMAL HIGH (ref 0–44)
AST: 24 U/L (ref 15–41)
Albumin: 3.9 g/dL (ref 3.5–5.0)
Alkaline Phosphatase: 99 U/L (ref 38–126)
Anion gap: 7 (ref 5–15)
BUN: 21 mg/dL — ABNORMAL HIGH (ref 6–20)
CO2: 24 mmol/L (ref 22–32)
Calcium: 9.4 mg/dL (ref 8.9–10.3)
Chloride: 103 mmol/L (ref 98–111)
Creatinine: 0.64 mg/dL (ref 0.44–1.00)
GFR, Estimated: >60 mL/min (ref >60)
Glucose, Bld: 104 mg/dL — ABNORMAL HIGH (ref 70–99)
Potassium: 4.5 mmol/L (ref 3.5–5.1)
Sodium: 134 mmol/L — ABNORMAL LOW (ref 135–145)
Total Bilirubin: 0.4 mg/dL (ref 0.3–1.2)
Total Protein: 5.3 g/dL — ABNORMAL LOW (ref 6.5–8.1)

## 2023-03-10 LAB — CBC WITH DIFFERENTIAL (CANCER CENTER ONLY)
Abs Immature Granulocytes: 0.38 10*3/uL — ABNORMAL HIGH (ref 0.00–0.07)
Basophils Absolute: 0 10*3/uL (ref 0.0–0.1)
Basophils Relative: 1 %
Eosinophils Absolute: 0 10*3/uL (ref 0.0–0.5)
Eosinophils Relative: 0 %
HCT: 29.9 % — ABNORMAL LOW (ref 36.0–46.0)
Hemoglobin: 9.6 g/dL — ABNORMAL LOW (ref 12.0–15.0)
Immature Granulocytes: 7 %
Lymphocytes Relative: 1 %
Lymphs Abs: 0.1 10*3/uL — ABNORMAL LOW (ref 0.7–4.0)
MCH: 29.3 pg (ref 26.0–34.0)
MCHC: 32.1 g/dL (ref 30.0–36.0)
MCV: 91.2 fL (ref 80.0–100.0)
Monocytes Absolute: 0.1 10*3/uL (ref 0.1–1.0)
Monocytes Relative: 1 %
Neutro Abs: 4.8 10*3/uL (ref 1.7–7.7)
Neutrophils Relative %: 90 %
Platelet Count: 107 10*3/uL — ABNORMAL LOW (ref 150–400)
RBC: 3.28 MIL/uL — ABNORMAL LOW (ref 3.87–5.11)
RDW: 21.1 % — ABNORMAL HIGH (ref 11.5–15.5)
WBC Count: 5.4 10*3/uL (ref 4.0–10.5)
nRBC: 0 % (ref 0.0–0.2)

## 2023-03-10 MED ORDER — HEPARIN SOD (PORK) LOCK FLUSH 100 UNIT/ML IV SOLN
500.0000 [IU] | Freq: Once | INTRAVENOUS | Status: AC
  Administered 2023-03-10: 500 [IU] via INTRAVENOUS

## 2023-03-10 MED ORDER — FULVESTRANT 250 MG/5ML IM SOSY
500.0000 mg | PREFILLED_SYRINGE | Freq: Once | INTRAMUSCULAR | Status: AC
  Administered 2023-03-10: 500 mg via INTRAMUSCULAR

## 2023-03-10 MED ORDER — SODIUM CHLORIDE 0.9% FLUSH
10.0000 mL | Freq: Once | INTRAVENOUS | Status: AC
  Administered 2023-03-10: 10 mL

## 2023-03-10 NOTE — Addendum Note (Signed)
Addended by: Corky Sing on: 03/10/2023 10:23 AM   Modules accepted: Orders

## 2023-03-10 NOTE — Patient Instructions (Signed)

## 2023-03-12 ENCOUNTER — Inpatient Hospital Stay: Payer: BC Managed Care – PPO

## 2023-03-12 ENCOUNTER — Encounter (HOSPITAL_BASED_OUTPATIENT_CLINIC_OR_DEPARTMENT_OTHER): Payer: Self-pay

## 2023-03-12 ENCOUNTER — Ambulatory Visit (HOSPITAL_BASED_OUTPATIENT_CLINIC_OR_DEPARTMENT_OTHER)
Admission: RE | Admit: 2023-03-12 | Discharge: 2023-03-12 | Disposition: A | Discharge status: Home / Self Care | Payer: BC Managed Care – PPO | Source: Ambulatory Visit | Attending: Hematology & Oncology | Admitting: Hematology & Oncology

## 2023-03-12 DIAGNOSIS — C50919 Malignant neoplasm of unspecified site of unspecified female breast: Secondary | ICD-10-CM | POA: Diagnosis not present

## 2023-03-12 DIAGNOSIS — C7951 Secondary malignant neoplasm of bone: Secondary | ICD-10-CM | POA: Diagnosis not present

## 2023-03-12 DIAGNOSIS — K7689 Other specified diseases of liver: Secondary | ICD-10-CM | POA: Diagnosis not present

## 2023-03-12 DIAGNOSIS — Z95828 Presence of other vascular implants and grafts: Secondary | ICD-10-CM

## 2023-03-12 MED ORDER — SODIUM CHLORIDE 0.9% FLUSH
10.0000 mL | Freq: Once | INTRAVENOUS | Status: AC
  Administered 2023-03-12: 10 mL via INTRAVENOUS

## 2023-03-12 MED ORDER — HEPARIN SOD (PORK) LOCK FLUSH 100 UNIT/ML IV SOLN
500.0000 [IU] | Freq: Once | INTRAVENOUS | Status: AC
  Administered 2023-03-12: 500 [IU] via INTRAVENOUS

## 2023-03-12 MED ORDER — IOHEXOL 300 MG/ML  SOLN
80.0000 mL | Freq: Once | INTRAMUSCULAR | Status: AC | PRN
  Administered 2023-03-12: 80 mL via INTRAVENOUS

## 2023-03-12 MED ORDER — HEPARIN SOD (PORK) LOCK FLUSH 100 UNIT/ML IV SOLN: 500.0000 [IU] | Freq: Once | INTRAVENOUS | Status: DC

## 2023-03-13 ENCOUNTER — Ambulatory Visit (HOSPITAL_BASED_OUTPATIENT_CLINIC_OR_DEPARTMENT_OTHER): Payer: BC Managed Care – PPO

## 2023-03-14 ENCOUNTER — Other Ambulatory Visit: Payer: Self-pay | Admitting: Hematology & Oncology

## 2023-03-14 DIAGNOSIS — C50919 Malignant neoplasm of unspecified site of unspecified female breast: Secondary | ICD-10-CM

## 2023-03-16 ENCOUNTER — Other Ambulatory Visit: Payer: Self-pay | Admitting: Hematology & Oncology

## 2023-03-16 DIAGNOSIS — C50919 Malignant neoplasm of unspecified site of unspecified female breast: Secondary | ICD-10-CM

## 2023-03-18 ENCOUNTER — Ambulatory Visit (HOSPITAL_COMMUNITY)
Admission: RE | Admit: 2023-03-18 | Discharge: 2023-03-18 | Disposition: A | Discharge status: Home / Self Care | Payer: BC Managed Care – PPO | Source: Ambulatory Visit | Attending: Hematology & Oncology | Admitting: Hematology & Oncology

## 2023-03-18 ENCOUNTER — Other Ambulatory Visit: Payer: Self-pay | Admitting: Nurse Practitioner

## 2023-03-18 DIAGNOSIS — G893 Neoplasm related pain (acute) (chronic): Secondary | ICD-10-CM

## 2023-03-18 DIAGNOSIS — C50919 Malignant neoplasm of unspecified site of unspecified female breast: Secondary | ICD-10-CM | POA: Insufficient documentation

## 2023-03-18 DIAGNOSIS — C7951 Secondary malignant neoplasm of bone: Secondary | ICD-10-CM | POA: Diagnosis not present

## 2023-03-18 DIAGNOSIS — Z515 Encounter for palliative care: Secondary | ICD-10-CM

## 2023-03-18 MED ORDER — FENTANYL 50 MCG/HR TD PT72
1.0000 | MEDICATED_PATCH | TRANSDERMAL | 0 refills | Status: DC
Start: 2023-03-18 — End: 2023-04-28

## 2023-03-18 MED ORDER — GADOBUTROL 1 MMOL/ML IV SOLN
7.0000 mL | Freq: Once | INTRAVENOUS | Status: AC | PRN
  Administered 2023-03-18: 7 mL via INTRAVENOUS

## 2023-03-18 NOTE — Telephone Encounter (Signed)
From: [name] To: Mayra Reel, NP Sent: 03/18/2023 3:55 AM EDT Subject: Medication Renewal Request  Refills have been requested for the following medications:   fentaNYL (DURAGESIC) 50 MCG/HR Annette Samples Pickenpack-Cousar]  Preferred pharmacy: [id number] [address], [place] [phone] Delivery method: Daryll Drown

## 2023-03-19 ENCOUNTER — Telehealth: Payer: Self-pay | Admitting: Nurse Practitioner

## 2023-03-19 ENCOUNTER — Other Ambulatory Visit: Payer: Self-pay | Admitting: Hematology & Oncology

## 2023-03-19 DIAGNOSIS — C50919 Malignant neoplasm of unspecified site of unspecified female breast: Secondary | ICD-10-CM

## 2023-03-19 NOTE — Telephone Encounter (Signed)
Scheduled appointment per staff message. Patient is aware of the made appointment. 

## 2023-03-22 ENCOUNTER — Encounter: Payer: Self-pay | Admitting: Hematology & Oncology

## 2023-03-22 ENCOUNTER — Encounter: Payer: Self-pay | Admitting: *Deleted

## 2023-03-22 ENCOUNTER — Other Ambulatory Visit: Payer: Self-pay | Admitting: Hematology & Oncology

## 2023-03-22 DIAGNOSIS — C50919 Malignant neoplasm of unspecified site of unspecified female breast: Secondary | ICD-10-CM

## 2023-03-22 MED ORDER — OXYCODONE HCL 5 MG PO TABS
5.0000 mg | ORAL_TABLET | ORAL | 0 refills | Status: DC | PRN
Start: 2023-03-22 — End: 2023-03-25

## 2023-03-23 ENCOUNTER — Encounter: Payer: Self-pay | Admitting: Hematology & Oncology

## 2023-03-23 ENCOUNTER — Encounter: Payer: Self-pay | Admitting: *Deleted

## 2023-03-23 ENCOUNTER — Encounter: Payer: Self-pay | Admitting: Nurse Practitioner

## 2023-03-23 ENCOUNTER — Inpatient Hospital Stay: Payer: BC Managed Care – PPO | Attending: Hematology & Oncology | Admitting: Nurse Practitioner

## 2023-03-23 DIAGNOSIS — Z515 Encounter for palliative care: Secondary | ICD-10-CM

## 2023-03-23 DIAGNOSIS — G893 Neoplasm related pain (acute) (chronic): Secondary | ICD-10-CM | POA: Diagnosis not present

## 2023-03-23 DIAGNOSIS — R978 Other abnormal tumor markers: Secondary | ICD-10-CM | POA: Insufficient documentation

## 2023-03-23 DIAGNOSIS — Z5111 Encounter for antineoplastic chemotherapy: Secondary | ICD-10-CM | POA: Insufficient documentation

## 2023-03-23 DIAGNOSIS — C7951 Secondary malignant neoplasm of bone: Secondary | ICD-10-CM | POA: Insufficient documentation

## 2023-03-23 DIAGNOSIS — Z170 Estrogen receptor positive status [ER+]: Secondary | ICD-10-CM | POA: Insufficient documentation

## 2023-03-23 DIAGNOSIS — C50919 Malignant neoplasm of unspecified site of unspecified female breast: Secondary | ICD-10-CM

## 2023-03-23 DIAGNOSIS — M898X9 Other specified disorders of bone, unspecified site: Secondary | ICD-10-CM

## 2023-03-23 DIAGNOSIS — Z79891 Long term (current) use of opiate analgesic: Secondary | ICD-10-CM | POA: Insufficient documentation

## 2023-03-23 DIAGNOSIS — K5903 Drug induced constipation: Secondary | ICD-10-CM

## 2023-03-23 MED ORDER — MELOXICAM 15 MG PO TABS
15.0000 mg | ORAL_TABLET | Freq: Every day | ORAL | 1 refills | Status: DC
Start: 2023-03-23 — End: 2023-04-28

## 2023-03-23 MED ORDER — OXYCODONE HCL 5 MG PO TABS
5.0000 mg | ORAL_TABLET | ORAL | 0 refills | Status: DC | PRN
Start: 2023-03-23 — End: 2023-03-25

## 2023-03-23 NOTE — Progress Notes (Signed)
Palliative Medicine [facility]  [phone] [phone]) [phone]   Name: Annette James Date: 03/23/2023 MRN: [id number]  DOB: 22-Feb-1961  Patient Care Team: Etta Grandchild, MD as PCP - General (Internal Medicine) Etta Grandchild, MD as Consulting Physician (Internal Medicine) Josph Macho, MD as Medical Oncologist (Oncology) Pickenpack-Cousar, Arty Baumgartner, NP as Nurse Practitioner (Nurse Practitioner)   I connected with [name] on 03/23/23 at  1:00 PM EDT by phone and verified that I am speaking with the correct person using two identifiers.   I discussed the limitations, risks, security and privacy concerns of performing an evaluation and management service by telemedicine and the availability of in-person appointments. I also discussed with the patient that there may be a patient responsible charge related to this service. The patient expressed understanding and agreed to proceed.   Other persons participating in the visit and their role in the encounter: n/a   Patient's location: home  Provider's location: [facility]   Chief Complaint: f/u of symptom management   INTERVAL HISTORY: Annette James is a 62 y.o. 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  Palliative ask to see for symptom and pain management and goals of care.   SOCIAL HISTORY:     reports that she has never smoked. She has never used smokeless tobacco. She reports that she does not currently use alcohol. She reports that she does not currently use drugs.  ADVANCE DIRECTIVES:  None on file  CODE STATUS: Full code  PAST MEDICAL HISTORY: Past Medical History:  Diagnosis Date   Breast cancer metastasized to bone, unspecified laterality (HCC) 11/01/2020   radiation txt to spine 11/13/2020-11/29/2020  Dr Roselind Messier   Chicken pox    Class 1 obesity 12/16/2020   Colitis 12/2020   Goals of care,  counseling/discussion 10/19/2020   History of radiation therapy 11/13/2020-11/29/2020   left hip   Dr Antony Blackbird   History of radiation therapy    right hip 07/07/2021-07/24/2021  Dr Antony Blackbird   History of radiation therapy    Right Pelvis(Hip) 11/10/21-11/21/21- Dr. Antony Blackbird   History of radiation therapy    Thoracic Spine 07/01/22-07/14/22- Dr. Antony Blackbird   Metastatic cancer to spine [facility]) 10/19/2020    ALLERGIES:  has No Known Allergies.  MEDICATIONS:  Current Outpatient Medications  Medication Sig Dispense Refill   ascorbic acid (VITAMIN C) 500 MG tablet Take 500 mg by mouth daily.     cyclobenzaprine (FLEXERIL) 5 MG tablet TAKE 1 TABLET BY MOUTH THREE TIMES A DAY AS NEEDED FOR MUSCLE SPASM 30 tablet 2   dexamethasone (DECADRON) 4 MG tablet Take 2 tablets (8 mg) by mouth daily for 3 days starting the day after chemotherapy. Take with food. 30 tablet 1   fentaNYL (DURAGESIC) 50 MCG/HR Place 1 patch onto the skin every other day. 10 patch 0   lidocaine-prilocaine (EMLA) cream Apply to affected area once 30 g 3   megestrol (MEGACE ES) 625 MG/5ML suspension TAKE 5 MLS (625 MG TOTAL) BY MOUTH 2 (TWO) TIMES DAILY. DISCARD ANY REMAINDER 900 mL 4   Multiple Vitamins-Minerals (ONE-A-DAY WOMENS 50 PLUS) TABS Take 1 tablet by mouth daily.     ondansetron (ZOFRAN) 8 MG tablet Take 1 tablet (8 mg total) by mouth every 8 (eight) hours as needed for nausea or vomiting. 90 tablet 1   oxyCODONE (OXY IR/ROXICODONE) 5 MG immediate release  tablet Take 1-2 tablets (5-10 mg total) by mouth every 4 (four) hours as needed. 120 tablet 0   polyethylene glycol (MIRALAX) 17 g packet Take 17 g by mouth daily as needed. 14 each 0   prochlorperazine (COMPAZINE) 10 MG tablet Take 1 tablet (10 mg total) by mouth every 6 (six) hours as needed for nausea or vomiting. 30 tablet 0   No current facility-administered medications for this visit.   Facility-Administered Medications Ordered in Other Visits   Medication Dose Route Frequency Provider Last Rate Last Admin   HYDROmorphone (DILAUDID) injection 2 mg  2 mg Intravenous Q2H PRN Josph Macho, MD        VITAL SIGNS: There were no vitals taken for this visit. There were no vitals filed for this visit.  Estimated body mass index is 23.57 kg/m as calculated from the following:   Height as of 03/04/23: 5\' 8"  (1.727 m).   Weight as of 03/04/23: 155 lb (70.3 kg).   PERFORMANCE STATUS (ECOG) : 1 - Symptomatic but completely ambulatory   IMPRESSION: I connected by phone with Annette James for symptom management follow-up. No acute distress noted. She is doing well overall. Trying to remain as active as possible. Denies nausea, vomiting, constipation, or diarrhea.   Neoplasm related pain Annette James reports her pain is better controlled on current regimen however still with some bone pain and discomfort. Some days are better than others.    We discussed her current regimen at length: Fentanyl 50 mcg patch every 48 hours, Oxycodone 5-10mg  as needed every 4 hours for breakthrough, and flexeril as needed for spasms. We discussed use of mobic to gain additional relief of bone targeting pain. Education provided on use and efficacy. She verbalized understanding.    She knows to contact office as needed.    Constipation Controlled with stool softeners. She has Senna on hand at home. Education provided on the importance of bowel regimen in the setting of opioid use.    Goals of Care 4/11- We discussed her current illness and what it means in the larger context of her on-going co-morbidities. Natural disease trajectory and expectations were discussed.   Annette James and her daughter are realistic in their understanding. She is clear in expressed goals to continue to treat aggressively allowing her every opportunity to continue to thrive. She is hopeful for a response to newer treatment.  I discussed the importance of continued conversation with  family and their medical providers regarding overall plan of care and treatment options, ensuring decisions are within the context of the patients values and GOCs.  PLAN: Ongoing support Fentanyl patch Oxycodone 5-10mg  every 4 hours as needed for breakthrough. Refilled by Dr. Jobie Quaker as needed for spasms Mobic 15mg  daily  I will plan to see patient back in 3-4 weeks in collaboration to other oncology appointments.    Patient expressed understanding and was in agreement with this plan. She also understands that She can call the clinic at any time with any questions, concerns, or complaints.   Any controlled substances utilized were prescribed in the context of palliative care. PDMP has been reviewed.    Visit consisted of counseling and education dealing with the complex and emotionally intense issues of symptom management and palliative care in the setting of serious and potentially life-threatening illness.Greater than 50%  of this time was spent counseling and coordinating care related to the above assessment and plan.  Willette Alma, AGPCNP-BC  Palliative Medicine Team/[facility]  *Please note that this is a verbal dictation therefore any spelling or grammatical errors are due to the "[employer]agon [employer] One" system interpretation.

## 2023-03-24 ENCOUNTER — Encounter: Payer: Self-pay | Admitting: Hematology & Oncology

## 2023-03-25 ENCOUNTER — Other Ambulatory Visit: Payer: Self-pay | Admitting: *Deleted

## 2023-03-25 ENCOUNTER — Inpatient Hospital Stay (HOSPITAL_BASED_OUTPATIENT_CLINIC_OR_DEPARTMENT_OTHER): Payer: BC Managed Care – PPO | Admitting: Hematology & Oncology

## 2023-03-25 ENCOUNTER — Encounter: Payer: Self-pay | Admitting: Hematology & Oncology

## 2023-03-25 ENCOUNTER — Other Ambulatory Visit (HOSPITAL_BASED_OUTPATIENT_CLINIC_OR_DEPARTMENT_OTHER): Payer: Self-pay

## 2023-03-25 ENCOUNTER — Inpatient Hospital Stay: Payer: BC Managed Care – PPO

## 2023-03-25 ENCOUNTER — Encounter: Payer: Self-pay | Admitting: *Deleted

## 2023-03-25 ENCOUNTER — Other Ambulatory Visit: Payer: Self-pay | Admitting: Internal Medicine

## 2023-03-25 VITALS — BP 125/83 | HR 97 | Resp 20

## 2023-03-25 VITALS — BP 139/89 | HR 117 | Temp 98.9°F | Resp 24 | Ht 68.0 in | Wt 165.0 lb

## 2023-03-25 DIAGNOSIS — C7951 Secondary malignant neoplasm of bone: Secondary | ICD-10-CM

## 2023-03-25 DIAGNOSIS — Z79891 Long term (current) use of opiate analgesic: Secondary | ICD-10-CM | POA: Diagnosis not present

## 2023-03-25 DIAGNOSIS — Z170 Estrogen receptor positive status [ER+]: Secondary | ICD-10-CM | POA: Diagnosis not present

## 2023-03-25 DIAGNOSIS — Z95828 Presence of other vascular implants and grafts: Secondary | ICD-10-CM

## 2023-03-25 DIAGNOSIS — C50919 Malignant neoplasm of unspecified site of unspecified female breast: Secondary | ICD-10-CM

## 2023-03-25 DIAGNOSIS — Z5111 Encounter for antineoplastic chemotherapy: Secondary | ICD-10-CM | POA: Diagnosis not present

## 2023-03-25 DIAGNOSIS — M898X9 Other specified disorders of bone, unspecified site: Secondary | ICD-10-CM

## 2023-03-25 DIAGNOSIS — G893 Neoplasm related pain (acute) (chronic): Secondary | ICD-10-CM

## 2023-03-25 DIAGNOSIS — Z515 Encounter for palliative care: Secondary | ICD-10-CM

## 2023-03-25 DIAGNOSIS — R978 Other abnormal tumor markers: Secondary | ICD-10-CM | POA: Diagnosis not present

## 2023-03-25 LAB — CMP (CANCER CENTER ONLY)
ALT: 15 U/L (ref 0–44)
AST: 16 U/L (ref 15–41)
Albumin: 3.7 g/dL (ref 3.5–5.0)
Alkaline Phosphatase: 73 U/L (ref 38–126)
Anion gap: 8 (ref 5–15)
BUN: 23 mg/dL — ABNORMAL HIGH (ref 6–20)
CO2: 23 mmol/L (ref 22–32)
Calcium: 9.1 mg/dL (ref 8.9–10.3)
Chloride: 105 mmol/L (ref 98–111)
Creatinine: 0.61 mg/dL (ref 0.44–1.00)
GFR, Estimated: >60 mL/min (ref >60)
Glucose, Bld: 91 mg/dL (ref 70–99)
Potassium: 4.3 mmol/L (ref 3.5–5.1)
Sodium: 136 mmol/L (ref 135–145)
Total Bilirubin: 0.3 mg/dL (ref 0.3–1.2)
Total Protein: 5.8 g/dL — ABNORMAL LOW (ref 6.5–8.1)

## 2023-03-25 LAB — CBC WITH DIFFERENTIAL (CANCER CENTER ONLY)
Abs Immature Granulocytes: 1.02 10*3/uL — ABNORMAL HIGH (ref 0.00–0.07)
Basophils Absolute: 0 10*3/uL (ref 0.0–0.1)
Basophils Relative: 0 %
Eosinophils Absolute: 0 10*3/uL (ref 0.0–0.5)
Eosinophils Relative: 0 %
HCT: 32.9 % — ABNORMAL LOW (ref 36.0–46.0)
Hemoglobin: 10.6 g/dL — ABNORMAL LOW (ref 12.0–15.0)
Immature Granulocytes: 12 %
Lymphocytes Relative: 3 %
Lymphs Abs: 0.2 10*3/uL — ABNORMAL LOW (ref 0.7–4.0)
MCH: 31.3 pg (ref 26.0–34.0)
MCHC: 32.2 g/dL (ref 30.0–36.0)
MCV: 97.1 fL (ref 80.0–100.0)
Monocytes Absolute: 1.2 10*3/uL — ABNORMAL HIGH (ref 0.1–1.0)
Monocytes Relative: 14 %
Neutro Abs: 6.3 10*3/uL (ref 1.7–7.7)
Neutrophils Relative %: 71 %
Platelet Count: 194 10*3/uL (ref 150–400)
RBC: 3.39 MIL/uL — ABNORMAL LOW (ref 3.87–5.11)
RDW: 24.3 % — ABNORMAL HIGH (ref 11.5–15.5)
Smear Review: NORMAL
WBC Count: 8.8 10*3/uL (ref 4.0–10.5)
nRBC: 0.3 % — ABNORMAL HIGH (ref 0.0–0.2)

## 2023-03-25 LAB — LACTATE DEHYDROGENASE: LDH: 235 U/L — ABNORMAL HIGH (ref 98–192)

## 2023-03-25 LAB — SAMPLE TO BLOOD BANK

## 2023-03-25 MED ORDER — KETOROLAC TROMETHAMINE 15 MG/ML IJ SOLN
30.0000 mg | Freq: Once | INTRAMUSCULAR | Status: AC
  Administered 2023-03-25: 30 mg via INTRAVENOUS
  Filled 2023-03-25: qty 2

## 2023-03-25 MED ORDER — SODIUM CHLORIDE 0.9 % IV SOLN: Freq: Once | INTRAVENOUS | Status: AC

## 2023-03-25 MED ORDER — FENTANYL 25 MCG/HR TD PT72: 1.0000 | MEDICATED_PATCH | TRANSDERMAL | 0 refills | Status: DC

## 2023-03-25 MED ORDER — HYDROMORPHONE HCL 1 MG/ML IJ SOLN
2.0000 mg | Freq: Once | INTRAMUSCULAR | Status: AC
  Administered 2023-03-25: 2 mg via INTRAVENOUS
  Filled 2023-03-25: qty 2

## 2023-03-25 MED ORDER — HYDROMORPHONE HCL 4 MG PO TABS
4.0000 mg | ORAL_TABLET | ORAL | 0 refills | Status: DC | PRN
  Filled 2023-03-25: qty 120, 20d supply, fill #0

## 2023-03-25 MED ORDER — SODIUM CHLORIDE 0.9% FLUSH
10.0000 mL | Freq: Once | INTRAVENOUS | Status: AC
  Administered 2023-03-25: 10 mL via INTRAVENOUS

## 2023-03-25 MED ORDER — HYDROMORPHONE HCL 4 MG PO TABS: 4.0000 mg | ORAL_TABLET | ORAL | 0 refills | Status: DC | PRN

## 2023-03-25 MED ORDER — HEPARIN SOD (PORK) LOCK FLUSH 100 UNIT/ML IV SOLN
500.0000 [IU] | Freq: Once | INTRAVENOUS | Status: AC
  Administered 2023-03-25: 500 [IU] via INTRAVENOUS

## 2023-03-25 NOTE — Progress Notes (Signed)
Hematology and Oncology Follow Up Visit  Annette James [id number] 1961/07/20 62 y.o. 03/25/2023   Principle Diagnosis:  Metastatic breast cancer-bone only metastasis-ER positive/PR positive/HER2 LOW (1+)///  PIK3CA (+) -- progressive   Current Therapy:        Faslodex 500 mg IM monthly-start on 11/06/2020 Ibrance 125 MG PO Q DAY (21 days on/7 days off) - d/c on 08/12/2022 XRT to the RIGHT hip  --completed on 07/24/2021 Zometa 4 mg IV q. 3 months -- next dose on 02/2023 XRT to T12 -- completed on 07/14/2022 Piqray 300 mg po q day -- start on 08/24/2022 -- d/c on 01/03/2023 Adria/Cytoxan -- s/p cycle #2 -- start on 01/19/2023 - d/c on 03/24/2023 Abraxane 90 mg/m2 q week (3/1) -- start on 03/31/2023    Interim History:  Ms. Annette James is here today for follow-up.  Unfortunately, I do that she is progressing.  We did do a CT of her body back in late May.  This did show increase in her liver met.  Her CA 27.29 has also been going up.  She just feels a little bit weaker.  She is eating okay.  She has had no nausea or vomiting.  She has had some dizzy spells.  She has fallen.  She did have an MRI that was done.  This was done on 03/18/2023.  This did not show any obvious intraparenchymal metastasis.  There was some dural thickening which could be reactive or possible tumor infiltration.  There was a 6 mm nodule in the posterior left lobe.  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  She has had no issues with her vision.  There has been no cough.  She has had no change in bowel or bladder habits.  Pain still is a problem for her.  We will have to increase the Duragesic to 75 mcg every 2 days.  I will take her off the oxycodone and try her on Dilaudid (4 mg p.o. every 4 hours as needed).  Is possible that she may have become tolerant of the oxycodone.  She has had her last blood transfusion probably about a month or so ago.  Hopefully this is a sign that her marrow is  improving.  She has had a little bit of swelling in the legs.  She has had no mouth sores.  There is no tingling in the hands or feet.  Overall, I would say that her performance status is probably ECOG 1.    Medications:  Allergies as of 03/25/2023   No Known Allergies      Medication List        Accurate as of March 25, 2023  1:40 PM. If you have any questions, ask your nurse or doctor.          ascorbic acid 500 MG tablet Commonly known as: VITAMIN C Take 500 mg by mouth daily.   cyclobenzaprine 5 MG tablet Commonly known as: FLEXERIL TAKE 1 TABLET BY MOUTH THREE TIMES A DAY AS NEEDED FOR MUSCLE SPASM   dexamethasone 4 MG tablet Commonly known as: DECADRON Take 2 tablets (8 mg) by mouth daily for 3 days starting the day after chemotherapy. Take with food.   fentaNYL 50 MCG/HR Commonly known as: DURAGESIC Place 1 patch onto the skin every other day.   lidocaine-prilocaine cream Commonly known as: EMLA Apply to affected area once   megestrol 625 MG/5ML suspension Commonly known as: MEGACE ES TAKE 5 MLS (625 MG TOTAL) BY  MOUTH 2 (TWO) TIMES DAILY. DISCARD ANY REMAINDER   meloxicam 15 MG tablet Commonly known as: Mobic Take 1 tablet (15 mg total) by mouth daily.   ondansetron 8 MG tablet Commonly known as: Zofran Take 1 tablet (8 mg total) by mouth every 8 (eight) hours as needed for nausea or vomiting.   One-A-Day Womens 50 Plus Tabs Take 1 tablet by mouth daily.   oxyCODONE 5 MG immediate release tablet Commonly known as: Oxy IR/ROXICODONE Take 1-2 tablets (5-10 mg total) by mouth every 4 (four) hours as needed.   polyethylene glycol 17 g packet Commonly known as: MiraLax Take 17 g by mouth daily as needed.   prochlorperazine 10 MG tablet Commonly known as: COMPAZINE Take 1 tablet (10 mg total) by mouth every 6 (six) hours as needed for nausea or vomiting.        Allergies: No Known Allergies  Past Medical History, Surgical history, Social  history, and Family History were reviewed and updated.  Review of Systems: Review of Systems  Constitutional: Negative.   HENT: Negative.    Eyes: Negative.   Respiratory: Negative.    Cardiovascular: Negative.   Gastrointestinal: Negative.   Genitourinary: Negative.   Musculoskeletal:  Positive for joint pain and neck pain.  Skin: Negative.   Neurological: Negative.   Endo/Heme/Allergies: Negative.   Psychiatric/Behavioral: Negative.        Physical Exam:  Vital signs with temperature 98.9.  Pulse 117.  Blood pressure 139/89.  Weight is 165 pounds.  Wt Readings from Last 3 Encounters:  03/25/23 165 lb (74.8 kg)  03/04/23 155 lb (70.3 kg)  02/19/23 157 lb (71.2 kg)    Physical Exam Vitals reviewed.  HENT:     Head: Normocephalic and atraumatic.  Eyes:     Pupils: Pupils are equal, round, and reactive to light.     Comments: She has some ecchymoses under both eyes.  She has good extraocular muscle movement of both eyes.  Pupils react appropriately.  Cardiovascular:     Rate and Rhythm: Normal rate and regular rhythm.     Heart sounds: Normal heart sounds.  Pulmonary:     Effort: Pulmonary effort is normal.     Breath sounds: Normal breath sounds.  Abdominal:     General: Bowel sounds are normal.     Palpations: Abdomen is soft.  Musculoskeletal:        General: No tenderness or deformity. Normal range of motion.     Cervical back: Normal range of motion.  Lymphadenopathy:     Cervical: No cervical adenopathy.  Skin:    General: Skin is warm and dry.     Findings: No erythema or rash.  Neurological:     Mental Status: She is alert and oriented to person, place, and time.  Psychiatric:        Behavior: Behavior normal.        Thought Content: Thought content normal.        Judgment: Judgment normal.      Lab Results  Component Value Date   WBC 8.8 03/25/2023   HGB 10.6 (L) 03/25/2023   HCT 32.9 (L) 03/25/2023   MCV 97.1 03/25/2023   PLT 194 03/25/2023    Lab Results  Component Value Date   FERRITIN 2,963 (H) 01/14/2023   IRON 61 01/14/2023   TIBC 175 (L) 01/14/2023   UIBC 114 (L) 01/14/2023   IRONPCTSAT 35 (H) 01/14/2023   Lab Results  Component Value Date   RETICCTPCT  2.0 01/05/2023   RBC 3.39 (L) 03/25/2023   Lab Results  Component Value Date   KAPLAMBRATIO 4.99 10/20/2020   Lab Results  Component Value Date   IGGSERUM 714 10/19/2020   IGA 153 10/19/2020   IGMSERUM 76 10/19/2020   Lab Results  Component Value Date   TOTALPROTELP 6.1 10/19/2020     Chemistry      Component Value Date/Time   NA 136 03/25/2023 1200   K 4.3 03/25/2023 1200   CL 105 03/25/2023 1200   CO2 23 03/25/2023 1200   BUN 23 (H) 03/25/2023 1200   CREATININE 0.61 03/25/2023 1200      Component Value Date/Time   CALCIUM 9.1 03/25/2023 1200   ALKPHOS 73 03/25/2023 1200   AST 16 03/25/2023 1200   ALT 15 03/25/2023 1200   BILITOT 0.3 03/25/2023 1200       Impression and Plan: Ms. Montag is a very pleasant 62 yo caucasian female with metastatic breast cancer confined at this time to her bones.  She was diagnosed back in December 2021.   We now have her on systemic chemotherapy.  She had been on Adriamycin/Cytoxan.  She seemed to respond to this initially.  However, I think we will going to have to make a change.  I will try her on Abraxane.  I think weekly Abraxane (3 weeks on and 1 week off) would really not be a bad idea for her.  I think she could tolerate this.  I think her blood counts could tolerate this.  Our goal clearly is her quality of life and her comfort.  I really think that we had to make sure that she is little bit more comfortable.  As such, I believe that we have to make an adjustment with her Duragesic patch.  I will switch her from oxycodone to hydromorphone.  I think this would be a good idea.  Hopefully this will help her.  In the office today, we will give some IV fluid.  I will give her some IV Dilaudid and some  Toradol.  Will try to get started next week with treatment.  Again, the CA 27.29 is use a very good marker for her.  We will plan to get her back when she starts her second 3-week cycle.    Josph Macho, MD 6/13/20241:40 PM

## 2023-03-25 NOTE — Patient Instructions (Signed)
Dehydration, Adult Dehydration is a condition in which there is not enough water or other fluids in the body. This happens when a person loses more fluids than they take in. Important organs cannot work right without the right amount of fluids. Any loss of fluids from the body can cause dehydration. Dehydration can be mild, worse, or very bad. It should be treated right away to keep it from getting very bad. What are the causes? Conditions that cause loss of water in the body. They include: Watery poop (diarrhea). Vomiting. Sweating a lot. Fever. Infection. Peeing (urinating) a lot. Not drinking enough fluids. Certain medicines, such as medicines that take extra fluid out of the body (diuretics). Lack of safe drinking water. Not being able to get enough water and food. What increases the risk? Having a long-term (chronic) illness that has not been treated the right way, such as: Diabetes. Heart disease. Kidney disease. Being 65 years of age or older. Having a disability. Living in a place that is high above the ground or sea (high in altitude). The thinner, drier air causes more fluid loss. Doing exercises that put stress on your body for a long time. Being active when in hot places. What are the signs or symptoms? Symptoms of dehydration depend on how bad it is. Mild or worse dehydration Thirst. Dry lips or dry mouth. Feeling dizzy or light-headed. Muscle cramps. Passing little pee or dark pee. Pee may be the color of tea. Headache. Very bad dehydration Changes in skin. Skin may: Be cold to the touch (clammy). Be blotchy or pale. Not go back to normal right after you pinch it and let it go. Little or no tears, pee, or sweat. Fast breathing. Low blood pressure. Weak pulse. Pulse that is more than 100 beats a minute when you are sitting still. Other changes, such as: Feeling very thirsty. Eyes that look hollow (sunken). Cold hands and feet. Being confused. Being very  tired (lethargic) or having trouble waking from sleep. Losing weight. Loss of consciousness. How is this treated? Treatment for this condition depends on how bad your dehydration is. Treatment should start right away. Do not wait until your condition gets very bad. Very bad dehydration is an emergency. You will need to go to a hospital. Mild or worse dehydration can be treated at home. You may be asked to: Drink more fluids. Drink an oral rehydration solution (ORS). This drink gives you the right amount of fluids, salts, and minerals (electrolytes). Very bad dehydration can be treated: With fluids through an IV tube. By correcting low levels of electrolytes in the body. By treating the problem that caused your dehydration. Follow these instructions at home: Oral rehydration solution If told by your doctor, drink an ORS: Make an ORS. Use instructions on the package. Start by drinking small amounts, about  cup (120 mL) every 5-10 minutes. Slowly drink more until you have had the amount that your doctor said to have.  Eating and drinking  Drink enough clear fluid to keep your pee pale yellow. If you were told to drink an ORS, finish the ORS first. Then, start slowly drinking other clear fluids. Drink fluids such as: Water. Do not drink only water. Doing that can make the salt (sodium) level in your body get too low. Water from ice chips you suck on. Fruit juice that you have added water to (diluted). Low-calorie sports drinks. Eat foods that have the right amounts of salts and minerals, such as bananas, oranges, potatoes,   tomatoes, or spinach. Do not drink alcohol. Avoid drinks that have caffeine or sugar. These include:: High-calorie sports drinks. Fruit juice that you did not add water to. Soda. Coffee or energy drinks. Avoid foods that are greasy or have a lot of fat or sugar. General instructions Take over-the-counter and prescription medicines only as told by your doctor. Do  not take sodium tablets. Doing that can make the salt level in your body get too high. Return to your normal activities as told by your doctor. Ask your doctor what activities are safe for you. Keep all follow-up visits. Your doctor may check and change your treatment. Contact a doctor if: You have pain in your belly (abdomen) and the pain: Gets worse. Stays in one place. You have a rash. You have a stiff neck. You get angry or annoyed more easily than normal. You are more tired or have a harder time waking than normal. You feel weak or dizzy. You feel very thirsty. Get help right away if: You have any symptoms of very bad dehydration. You vomit every time you eat or drink. Your vomiting gets worse, does not go away, or you vomit blood or green stuff. You are getting treatment, but symptoms are getting worse. You have a fever. You have a very bad headache. You have: Diarrhea that gets worse or does not go away. Blood in your poop (stool). This may cause poop to look black and tarry. No pee in 6-8 hours. Only a small amount of pee in 6-8 hours, and the pee is very dark. You have trouble breathing. These symptoms may be an emergency. Get help right away. Call 911. Do not wait to see if the symptoms will go away. Do not drive yourself to the hospital. This information is not intended to replace advice given to you by your health care provider. Make sure you discuss any questions you have with your health care provider. Document Revised: 04/27/2022 Document Reviewed: 04/27/2022 Elsevier Patient Education  2024 Elsevier Inc.  

## 2023-03-26 ENCOUNTER — Ambulatory Visit: Payer: BC Managed Care – PPO

## 2023-03-26 ENCOUNTER — Ambulatory Visit: Payer: BC Managed Care – PPO | Admitting: Hematology & Oncology

## 2023-03-26 ENCOUNTER — Other Ambulatory Visit: Payer: BC Managed Care – PPO

## 2023-03-26 ENCOUNTER — Inpatient Hospital Stay: Payer: BC Managed Care – PPO

## 2023-03-26 LAB — CANCER ANTIGEN 27.29: CA 27.29: 109.6 U/mL — ABNORMAL HIGH (ref 0.0–38.6)

## 2023-04-01 ENCOUNTER — Inpatient Hospital Stay: Payer: BC Managed Care – PPO

## 2023-04-01 DIAGNOSIS — C50919 Malignant neoplasm of unspecified site of unspecified female breast: Secondary | ICD-10-CM

## 2023-04-01 DIAGNOSIS — M898X9 Other specified disorders of bone, unspecified site: Secondary | ICD-10-CM | POA: Diagnosis not present

## 2023-04-01 DIAGNOSIS — Z170 Estrogen receptor positive status [ER+]: Secondary | ICD-10-CM | POA: Diagnosis not present

## 2023-04-01 DIAGNOSIS — C7951 Secondary malignant neoplasm of bone: Secondary | ICD-10-CM | POA: Diagnosis not present

## 2023-04-01 DIAGNOSIS — Z5111 Encounter for antineoplastic chemotherapy: Secondary | ICD-10-CM | POA: Diagnosis not present

## 2023-04-01 LAB — CBC WITH DIFFERENTIAL (CANCER CENTER ONLY)
Abs Immature Granulocytes: 0.14 10*3/uL — ABNORMAL HIGH (ref 0.00–0.07)
Basophils Absolute: 0 10*3/uL (ref 0.0–0.1)
Basophils Relative: 1 %
Eosinophils Absolute: 0 10*3/uL (ref 0.0–0.5)
Eosinophils Relative: 1 %
HCT: 30.4 % — ABNORMAL LOW (ref 36.0–46.0)
Hemoglobin: 9.9 g/dL — ABNORMAL LOW (ref 12.0–15.0)
Immature Granulocytes: 2 %
Lymphocytes Relative: 9 %
Lymphs Abs: 0.6 10*3/uL — ABNORMAL LOW (ref 0.7–4.0)
MCH: 30.8 pg (ref 26.0–34.0)
MCHC: 32.6 g/dL (ref 30.0–36.0)
MCV: 94.7 fL (ref 80.0–100.0)
Monocytes Absolute: 0.6 10*3/uL (ref 0.1–1.0)
Monocytes Relative: 10 %
Neutro Abs: 4.9 10*3/uL (ref 1.7–7.7)
Neutrophils Relative %: 77 %
Platelet Count: 139 10*3/uL — ABNORMAL LOW (ref 150–400)
RBC: 3.21 MIL/uL — ABNORMAL LOW (ref 3.87–5.11)
RDW: 22.2 % — ABNORMAL HIGH (ref 11.5–15.5)
WBC Count: 6.2 10*3/uL (ref 4.0–10.5)
nRBC: 0 % (ref 0.0–0.2)

## 2023-04-01 LAB — CMP (CANCER CENTER ONLY)
ALT: 14 U/L (ref 0–44)
AST: 19 U/L (ref 15–41)
Albumin: 3.6 g/dL (ref 3.5–5.0)
Alkaline Phosphatase: 90 U/L (ref 38–126)
Anion gap: 10 (ref 5–15)
BUN: 15 mg/dL (ref 6–20)
CO2: 24 mmol/L (ref 22–32)
Calcium: 9.3 mg/dL (ref 8.9–10.3)
Chloride: 103 mmol/L (ref 98–111)
Creatinine: 0.58 mg/dL (ref 0.44–1.00)
GFR, Estimated: >60 mL/min (ref >60)
Glucose, Bld: 114 mg/dL — ABNORMAL HIGH (ref 70–99)
Potassium: 3.6 mmol/L (ref 3.5–5.1)
Sodium: 137 mmol/L (ref 135–145)
Total Bilirubin: 0.4 mg/dL (ref 0.3–1.2)
Total Protein: 5.9 g/dL — ABNORMAL LOW (ref 6.5–8.1)

## 2023-04-01 MED ORDER — SODIUM CHLORIDE 0.9 % IV SOLN: Freq: Once | INTRAVENOUS | Status: AC

## 2023-04-01 MED ORDER — SODIUM CHLORIDE 0.9% FLUSH
10.0000 mL | INTRAVENOUS | Status: DC | PRN
  Administered 2023-04-01: 10 mL

## 2023-04-01 MED ORDER — PROCHLORPERAZINE MALEATE 10 MG PO TABS
10.0000 mg | ORAL_TABLET | Freq: Once | ORAL | Status: AC
  Administered 2023-04-01: 10 mg via ORAL
  Filled 2023-04-01: qty 1

## 2023-04-01 MED ORDER — PACLITAXEL PROTEIN-BOUND CHEMO INJECTION 100 MG
90.0000 mg/m2 | Freq: Once | INTRAVENOUS | Status: AC
  Administered 2023-04-01: 175 mg via INTRAVENOUS
  Filled 2023-04-01: qty 35

## 2023-04-01 MED ORDER — HEPARIN SOD (PORK) LOCK FLUSH 100 UNIT/ML IV SOLN
500.0000 [IU] | Freq: Once | INTRAVENOUS | Status: AC | PRN
  Administered 2023-04-01: 500 [IU]

## 2023-04-02 ENCOUNTER — Encounter: Payer: Self-pay | Admitting: *Deleted

## 2023-04-06 ENCOUNTER — Other Ambulatory Visit: Payer: Self-pay | Admitting: Oncology

## 2023-04-06 ENCOUNTER — Encounter: Payer: Self-pay | Admitting: Hematology & Oncology

## 2023-04-06 DIAGNOSIS — C50919 Malignant neoplasm of unspecified site of unspecified female breast: Secondary | ICD-10-CM

## 2023-04-06 MED ORDER — PROCHLORPERAZINE MALEATE 10 MG PO TABS
10.0000 mg | ORAL_TABLET | Freq: Four times a day (QID) | ORAL | 0 refills | Status: DC | PRN
Start: 2023-04-06 — End: 2023-05-26

## 2023-04-08 ENCOUNTER — Inpatient Hospital Stay: Payer: BC Managed Care – PPO

## 2023-04-08 VITALS — BP 145/89 | HR 100 | Temp 98.6°F | Resp 18

## 2023-04-08 DIAGNOSIS — C50919 Malignant neoplasm of unspecified site of unspecified female breast: Secondary | ICD-10-CM | POA: Diagnosis not present

## 2023-04-08 DIAGNOSIS — Z5111 Encounter for antineoplastic chemotherapy: Secondary | ICD-10-CM | POA: Diagnosis not present

## 2023-04-08 DIAGNOSIS — Z170 Estrogen receptor positive status [ER+]: Secondary | ICD-10-CM | POA: Diagnosis not present

## 2023-04-08 DIAGNOSIS — M898X9 Other specified disorders of bone, unspecified site: Secondary | ICD-10-CM | POA: Diagnosis not present

## 2023-04-08 DIAGNOSIS — C7951 Secondary malignant neoplasm of bone: Secondary | ICD-10-CM | POA: Diagnosis not present

## 2023-04-08 LAB — CBC WITH DIFFERENTIAL (CANCER CENTER ONLY)
Abs Immature Granulocytes: 0.28 10*3/uL — ABNORMAL HIGH (ref 0.00–0.07)
Basophils Absolute: 0 10*3/uL (ref 0.0–0.1)
Basophils Relative: 1 %
Eosinophils Absolute: 0.3 10*3/uL (ref 0.0–0.5)
Eosinophils Relative: 7 %
HCT: 28.5 % — ABNORMAL LOW (ref 36.0–46.0)
Hemoglobin: 9.3 g/dL — ABNORMAL LOW (ref 12.0–15.0)
Immature Granulocytes: 8 %
Lymphocytes Relative: 17 %
Lymphs Abs: 0.6 10*3/uL — ABNORMAL LOW (ref 0.7–4.0)
MCH: 30.8 pg (ref 26.0–34.0)
MCHC: 32.6 g/dL (ref 30.0–36.0)
MCV: 94.4 fL (ref 80.0–100.0)
Monocytes Absolute: 0.5 10*3/uL (ref 0.1–1.0)
Monocytes Relative: 13 %
Neutro Abs: 1.9 10*3/uL (ref 1.7–7.7)
Neutrophils Relative %: 54 %
Platelet Count: 199 10*3/uL (ref 150–400)
RBC: 3.02 MIL/uL — ABNORMAL LOW (ref 3.87–5.11)
RDW: 20.3 % — ABNORMAL HIGH (ref 11.5–15.5)
Smear Review: NORMAL
WBC Count: 3.5 10*3/uL — ABNORMAL LOW (ref 4.0–10.5)
nRBC: 1.1 % — ABNORMAL HIGH (ref 0.0–0.2)

## 2023-04-08 LAB — CMP (CANCER CENTER ONLY)
ALT: 16 U/L (ref 0–44)
AST: 24 U/L (ref 15–41)
Albumin: 2.9 g/dL — ABNORMAL LOW (ref 3.5–5.0)
Alkaline Phosphatase: 94 U/L (ref 38–126)
Anion gap: 9 (ref 5–15)
BUN: 11 mg/dL (ref 6–20)
CO2: 22 mmol/L (ref 22–32)
Calcium: 8.7 mg/dL — ABNORMAL LOW (ref 8.9–10.3)
Chloride: 103 mmol/L (ref 98–111)
Creatinine: 0.55 mg/dL (ref 0.44–1.00)
GFR, Estimated: >60 mL/min (ref >60)
Glucose, Bld: 96 mg/dL (ref 70–99)
Potassium: 3.7 mmol/L (ref 3.5–5.1)
Sodium: 134 mmol/L — ABNORMAL LOW (ref 135–145)
Total Bilirubin: 0.4 mg/dL (ref 0.3–1.2)
Total Protein: 6.3 g/dL — ABNORMAL LOW (ref 6.5–8.1)

## 2023-04-08 MED ORDER — PACLITAXEL PROTEIN-BOUND CHEMO INJECTION 100 MG
90.0000 mg/m2 | Freq: Once | INTRAVENOUS | Status: AC
  Administered 2023-04-08: 175 mg via INTRAVENOUS
  Filled 2023-04-08: qty 35

## 2023-04-08 MED ORDER — PROCHLORPERAZINE MALEATE 10 MG PO TABS
10.0000 mg | ORAL_TABLET | Freq: Once | ORAL | Status: DC
Start: 2023-04-08 — End: 2023-04-08

## 2023-04-08 MED ORDER — PALONOSETRON HCL INJECTION 0.25 MG/5ML
0.2500 mg | Freq: Once | INTRAVENOUS | Status: AC
  Administered 2023-04-08: 0.25 mg via INTRAVENOUS
  Filled 2023-04-08: qty 5

## 2023-04-08 MED ORDER — HEPARIN SOD (PORK) LOCK FLUSH 100 UNIT/ML IV SOLN
500.0000 [IU] | Freq: Once | INTRAVENOUS | Status: AC | PRN
  Administered 2023-04-08: 500 [IU]

## 2023-04-08 MED ORDER — SODIUM CHLORIDE 0.9% FLUSH
10.0000 mL | INTRAVENOUS | Status: DC | PRN
  Administered 2023-04-08: 10 mL

## 2023-04-08 MED ORDER — SODIUM CHLORIDE 0.9 % IV SOLN: Freq: Once | INTRAVENOUS | Status: AC

## 2023-04-08 MED ORDER — FULVESTRANT 250 MG/5ML IM SOSY
500.0000 mg | PREFILLED_SYRINGE | Freq: Once | INTRAMUSCULAR | Status: AC
  Administered 2023-04-08: 500 mg via INTRAMUSCULAR

## 2023-04-08 MED ORDER — SODIUM CHLORIDE 0.9 % IV SOLN
10.0000 mg | Freq: Once | INTRAVENOUS | Status: AC
  Administered 2023-04-08: 10 mg via INTRAVENOUS
  Filled 2023-04-08: qty 10

## 2023-04-08 MED ORDER — SODIUM CHLORIDE 0.9 % IV SOLN
150.0000 mg | Freq: Once | INTRAVENOUS | Status: AC
  Administered 2023-04-08: 150 mg via INTRAVENOUS
  Filled 2023-04-08: qty 5

## 2023-04-08 NOTE — Patient Instructions (Signed)
[facility]  Discharge Instructions: Thank you for choosing [facility] to provide your oncology and hematology care.   If you have a lab appointment with the Cancer Center, please go directly to the Cancer Center and check in at the registration area.  Wear comfortable clothing and clothing appropriate for easy access to any Portacath or PICC line.   We strive to give you quality time with your provider. You may need to reschedule your appointment if you arrive late (15 or more minutes).  Arriving late affects you and other patients whose appointments are after yours.  Also, if you miss three or more appointments without notifying the office, you may be dismissed from the clinic at the provider's discretion.      For prescription refill requests, have your pharmacy contact our office and allow 72 hours for refills to be completed.    Today you received the following chemotherapy and/or immunotherapy agents Abraxane      To help prevent nausea and vomiting after your treatment, we encourage you to take your nausea medication as directed.  BELOW ARE SYMPTOMS THAT SHOULD BE REPORTED IMMEDIATELY: *FEVER GREATER THAN 100.4 F (38 C) OR HIGHER *CHILLS OR SWEATING *NAUSEA AND VOMITING THAT IS NOT CONTROLLED WITH YOUR NAUSEA MEDICATION *UNUSUAL SHORTNESS OF BREATH *UNUSUAL BRUISING OR BLEEDING *URINARY PROBLEMS (pain or burning when urinating, or frequent urination) *BOWEL PROBLEMS (unusual diarrhea, constipation, pain near the anus) TENDERNESS IN MOUTH AND THROAT WITH OR WITHOUT PRESENCE OF ULCERS (sore throat, sores in mouth, or a toothache) UNUSUAL RASH, SWELLING OR PAIN  UNUSUAL VAGINAL DISCHARGE OR ITCHING   Items with * indicate a potential emergency and should be followed up as soon as possible or go to the Emergency Department if any problems should occur.  Please show the CHEMOTHERAPY ALERT CARD or IMMUNOTHERAPY ALERT CARD at check-in  to the Emergency Department and triage nurse. Should you have questions after your visit or need to cancel or reschedule your appointment, please contact [facility] [place]  [phone] and follow the prompts.  Office hours are 8:00 a.m. to 4:30 p.m. Monday - Friday. Please note that voicemails left after 4:00 p.m. may not be returned until the following business day.  We are closed weekends and major holidays. You have access to a nurse at all times for urgent questions. Please call the main number to the clinic [phone] and follow the prompts.  For any non-urgent questions, you may also contact your provider using MyChart. We now offer e-Visits for anyone 50 and older to request care online for non-urgent symptoms. For details visit [email or web address].   Also download the MyChart app! Go to the app store, search "MyChart", open the app, select , and log in with your MyChart username and password.

## 2023-04-08 NOTE — Progress Notes (Signed)
Patient complained of nausea starting Saturday after her treatment with Abraxane on the previous Thursday. Dr. Myna Hidalgo notified. Order given for Aloxi and Emend to be added to her treatment plan. Patient verbalized understanding.

## 2023-04-12 ENCOUNTER — Encounter: Payer: Self-pay | Admitting: *Deleted

## 2023-04-16 ENCOUNTER — Inpatient Hospital Stay: Payer: BC Managed Care – PPO | Attending: Hematology & Oncology

## 2023-04-16 ENCOUNTER — Other Ambulatory Visit: Payer: Self-pay

## 2023-04-16 ENCOUNTER — Other Ambulatory Visit: Payer: Self-pay | Admitting: Hematology & Oncology

## 2023-04-16 ENCOUNTER — Inpatient Hospital Stay: Payer: BC Managed Care – PPO

## 2023-04-16 DIAGNOSIS — C50919 Malignant neoplasm of unspecified site of unspecified female breast: Secondary | ICD-10-CM | POA: Diagnosis not present

## 2023-04-16 DIAGNOSIS — Z5111 Encounter for antineoplastic chemotherapy: Secondary | ICD-10-CM | POA: Insufficient documentation

## 2023-04-16 DIAGNOSIS — C7951 Secondary malignant neoplasm of bone: Secondary | ICD-10-CM | POA: Diagnosis not present

## 2023-04-16 DIAGNOSIS — Z170 Estrogen receptor positive status [ER+]: Secondary | ICD-10-CM | POA: Diagnosis not present

## 2023-04-16 DIAGNOSIS — M898X9 Other specified disorders of bone, unspecified site: Secondary | ICD-10-CM | POA: Diagnosis not present

## 2023-04-16 LAB — CBC WITH DIFFERENTIAL (CANCER CENTER ONLY)
Abs Immature Granulocytes: 0.15 10*3/uL — ABNORMAL HIGH (ref 0.00–0.07)
Basophils Absolute: 0 10*3/uL (ref 0.0–0.1)
Basophils Relative: 1 %
Eosinophils Absolute: 0.2 10*3/uL (ref 0.0–0.5)
Eosinophils Relative: 7 %
HCT: 30.1 % — ABNORMAL LOW (ref 36.0–46.0)
Hemoglobin: 9.9 g/dL — ABNORMAL LOW (ref 12.0–15.0)
Immature Granulocytes: 4 %
Lymphocytes Relative: 26 %
Lymphs Abs: 0.9 10*3/uL (ref 0.7–4.0)
MCH: 30.9 pg (ref 26.0–34.0)
MCHC: 32.9 g/dL (ref 30.0–36.0)
MCV: 94.1 fL (ref 80.0–100.0)
Monocytes Absolute: 0.7 10*3/uL (ref 0.1–1.0)
Monocytes Relative: 20 %
Neutro Abs: 1.5 10*3/uL — ABNORMAL LOW (ref 1.7–7.7)
Neutrophils Relative %: 42 %
Platelet Count: 267 10*3/uL (ref 150–400)
RBC: 3.2 MIL/uL — ABNORMAL LOW (ref 3.87–5.11)
RDW: 19.8 % — ABNORMAL HIGH (ref 11.5–15.5)
WBC Count: 3.5 10*3/uL — ABNORMAL LOW (ref 4.0–10.5)
nRBC: 2.3 % — ABNORMAL HIGH (ref 0.0–0.2)

## 2023-04-16 LAB — CMP (CANCER CENTER ONLY)
ALT: 21 U/L (ref 0–44)
AST: 24 U/L (ref 15–41)
Albumin: 3.9 g/dL (ref 3.5–5.0)
Alkaline Phosphatase: 97 U/L (ref 38–126)
Anion gap: 10 (ref 5–15)
BUN: 12 mg/dL (ref 6–20)
CO2: 24 mmol/L (ref 22–32)
Calcium: 9.5 mg/dL (ref 8.9–10.3)
Chloride: 104 mmol/L (ref 98–111)
Creatinine: 0.65 mg/dL (ref 0.44–1.00)
GFR, Estimated: >60 mL/min (ref >60)
Glucose, Bld: 128 mg/dL — ABNORMAL HIGH (ref 70–99)
Potassium: 3.7 mmol/L (ref 3.5–5.1)
Sodium: 138 mmol/L (ref 135–145)
Total Bilirubin: 0.4 mg/dL (ref 0.3–1.2)
Total Protein: 6.3 g/dL — ABNORMAL LOW (ref 6.5–8.1)

## 2023-04-16 MED ORDER — PACLITAXEL PROTEIN-BOUND CHEMO INJECTION 100 MG
90.0000 mg/m2 | Freq: Once | INTRAVENOUS | Status: AC
  Administered 2023-04-16: 175 mg via INTRAVENOUS
  Filled 2023-04-16: qty 35

## 2023-04-16 MED ORDER — PROCHLORPERAZINE MALEATE 10 MG PO TABS
10.0000 mg | ORAL_TABLET | Freq: Once | ORAL | Status: AC
  Administered 2023-04-16: 10 mg via ORAL
  Filled 2023-04-16: qty 1

## 2023-04-16 MED ORDER — PALONOSETRON HCL INJECTION 0.25 MG/5ML
0.2500 mg | Freq: Once | INTRAVENOUS | Status: AC
  Administered 2023-04-16: 0.25 mg via INTRAVENOUS
  Filled 2023-04-16: qty 5

## 2023-04-16 MED ORDER — SODIUM CHLORIDE 0.9 % IV SOLN: Freq: Once | INTRAVENOUS | Status: AC

## 2023-04-16 MED ORDER — SODIUM CHLORIDE 0.9% FLUSH
10.0000 mL | INTRAVENOUS | Status: DC | PRN
  Administered 2023-04-16 (×2): 10 mL

## 2023-04-16 MED ORDER — SODIUM CHLORIDE 0.9 % IV SOLN
150.0000 mg | Freq: Once | INTRAVENOUS | Status: AC
  Administered 2023-04-16: 150 mg via INTRAVENOUS
  Filled 2023-04-16: qty 150

## 2023-04-16 MED ORDER — LORAZEPAM 1 MG PO TABS: 1.0000 mg | ORAL_TABLET | Freq: Every evening | ORAL | 0 refills | Status: DC | PRN

## 2023-04-16 MED ORDER — SODIUM CHLORIDE 0.9 % IV SOLN
10.0000 mg | Freq: Once | INTRAVENOUS | Status: AC
  Administered 2023-04-16: 10 mg via INTRAVENOUS
  Filled 2023-04-16: qty 10

## 2023-04-16 MED ORDER — HEPARIN SOD (PORK) LOCK FLUSH 100 UNIT/ML IV SOLN
500.0000 [IU] | Freq: Once | INTRAVENOUS | Status: AC | PRN
  Administered 2023-04-16: 500 [IU]

## 2023-04-16 NOTE — Patient Instructions (Signed)

## 2023-04-16 NOTE — Patient Instructions (Signed)
Paclitaxel Nanoparticle Albumin-Bound Injection What is this medication? NANOPARTICLE ALBUMIN-BOUND PACLITAXEL (Na no PAHR ti kuhl al BYOO muhn-bound PAK li TAX el) treats some types of cancer. It works by slowing down the growth of cancer cells. This medicine may be used for other purposes; ask your health care provider or pharmacist if you have questions. COMMON BRAND NAME(S): Abraxane What should I tell my care team before I take this medication? They need to know if you have any of these conditions: Liver disease Low white blood cell levels An unusual or allergic reaction to paclitaxel, albumin, other medications, foods, dyes, or preservatives If you or your partner are pregnant or trying to get pregnant Breast-feeding How should I use this medication? This medication is injected into a vein. It is given by your care team in a hospital or clinic setting. Talk to your care team about the use of this medication in children. Special care may be needed. Overdosage: If you think you have taken too much of this medicine contact a poison control center or emergency room at once. NOTE: This medicine is only for you. Do not share this medicine with others. What if I miss a dose? Keep appointments for follow-up doses. It is important not to miss your dose. Call your care team if you are unable to keep an appointment. What may interact with this medication? Other medications may affect the way this medication works. Talk with your care team about all of the medications you take. They may suggest changes to your treatment plan to lower the risk of side effects and to make sure your medications work as intended. This list may not describe all possible interactions. Give your health care provider a list of all the medicines, herbs, non-prescription drugs, or dietary supplements you use. Also tell them if you smoke, drink alcohol, or use illegal drugs. Some items may interact with your medicine. What  should I watch for while using this medication? Your condition will be monitored carefully while you are receiving this medication. You may need blood work while taking this medication. This medication may make you feel generally unwell. This is not uncommon as chemotherapy can affect healthy cells as well as cancer cells. Report any side effects. Continue your course of treatment even though you feel ill unless your care team tells you to stop. This medication can cause serious allergic reactions. To reduce the risk, your care team may give you other medications to take before receiving this one. Be sure to follow the directions from your care team. This medication may increase your risk of getting an infection. Call your care team for advice if you get a fever, chills, sore throat, or other symptoms of a cold or flu. Do not treat yourself. Try to avoid being around people who are sick. This medication may increase your risk to bruise or bleed. Call your care team if you notice any unusual bleeding. Be careful brushing or flossing your teeth or using a toothpick because you may get an infection or bleed more easily. If you have any dental work done, tell your dentist you are receiving this medication. Talk to your care team if you or your partner may be pregnant. Serious birth defects can occur if you take this medication during pregnancy and for 6 months after the last dose. You will need a negative pregnancy test before starting this medication. Contraception is recommended while taking this medication and for 6 months after the last dose. Your care team   can help you find the option that works for you. If your partner can get pregnant, use a condom during sex while taking this medication and for 3 months after the last dose. Do not breastfeed while taking this medication and for 2 weeks after the last dose. This medication may cause infertility. Talk to your care team if you are concerned about your  fertility. What side effects may I notice from receiving this medication? Side effects that you should report to your care team as soon as possible: Allergic reactions--skin rash, itching, hives, swelling of the face, lips, tongue, or throat Dry cough, shortness of breath or trouble breathing Infection--fever, chills, cough, sore throat, wounds that don't heal, pain or trouble when passing urine, general feeling of discomfort or being unwell Low red blood cell level--unusual weakness or fatigue, dizziness, headache, trouble breathing Pain, tingling, or numbness in the hands or feet Stomach pain, unusual weakness or fatigue, nausea, vomiting, diarrhea, or fever that lasts longer than expected Unusual bruising or bleeding Side effects that usually do not require medical attention (report to your care team if they continue or are bothersome): Diarrhea Fatigue Hair loss Loss of appetite Nausea Vomiting This list may not describe all possible side effects. Call your doctor for medical advice about side effects. You may report side effects to FDA at 1-800-FDA-1088. Where should I keep my medication? This medication is given in a hospital or clinic. It will not be stored at home. NOTE: This sheet is a summary. It may not cover all possible information. If you have questions about this medicine, talk to your doctor, pharmacist, or health care provider.  2024 Elsevier/Gold Standard (2022-02-12 00:00:00)  

## 2023-04-16 NOTE — Progress Notes (Signed)
MD reviewed labs.  "OK to treat despite counts"

## 2023-04-19 ENCOUNTER — Encounter: Payer: Self-pay | Admitting: Hematology & Oncology

## 2023-04-19 MED ORDER — FENTANYL 25 MCG/HR TD PT72
1.0000 | MEDICATED_PATCH | TRANSDERMAL | 0 refills | Status: DC
Start: 2023-04-19 — End: 2023-04-28

## 2023-04-19 NOTE — Telephone Encounter (Signed)
Last refilled 03/25/23 for 5 patches with 0 refills. Please advise for refills. Thanks!

## 2023-04-20 ENCOUNTER — Inpatient Hospital Stay: Payer: BC Managed Care – PPO | Admitting: Nurse Practitioner

## 2023-04-20 NOTE — Progress Notes (Signed)
Palliative Medicine [facility]  [phone] [phone]   Name: Annette James Date: 04/20/2023 MRN: [id number]  DOB: January 30, 1961  Patient Care Team: Annette Grandchild, MD as PCP - General (Internal Medicine) Annette James, Annette Baumgartner, NP as Nurse Practitioner (Nurse Practitioner)   I connected with Annette James on 04/20/23 at  2:30 PM EDT by phone and verified that I am speaking with the correct person using two identifiers.   I discussed the limitations, risks, security and privacy concerns of performing an evaluation and management service by telemedicine and the availability of in-person appointments. I also discussed with the patient that there may be a patient responsible charge related to this service. The patient expressed understanding and agreed to proceed.   Other persons participating in the visit and their role in the encounter: n/a   Patient's location: home  Provider's location: [facility] [place]   Chief Complaint: f/u of symptom management   INTERVAL HISTORY: Annette James is a 62 y.o. 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  Palliative ask to see for symptom and pain management and goals of care.   SOCIAL HISTORY:     reports that she has never smoked. She has never used smokeless tobacco. She reports that she does not currently use alcohol. She reports that she does not currently use drugs.  ADVANCE DIRECTIVES:  None on file  CODE STATUS: Full code  PAST MEDICAL HISTORY: Past Medical History:  Diagnosis Date   Breast cancer metastasized to bone, unspecified laterality (HCC) 11/01/2020   radiation txt to spine 11/13/2020-11/29/2020  Dr Roselind Messier   Chicken pox    Class 1 obesity 12/16/2020   Colitis 12/2020   Goals of care, counseling/discussion 10/19/2020   History of radiation therapy 11/13/2020-11/29/2020   left hip   Dr Antony Blackbird   History  of radiation therapy    right hip 07/07/2021-07/24/2021  Dr Antony Blackbird   History of radiation therapy    Right Pelvis(Hip) 11/10/21-11/21/21- Dr. Antony Blackbird   History of radiation therapy    Thoracic Spine 07/01/22-07/14/22- Dr. Antony Blackbird   Metastatic cancer to spine [facility]) 10/19/2020    ALLERGIES:  has No Known Allergies.  MEDICATIONS:  Current Outpatient Medications  Medication Sig Dispense Refill   ascorbic acid (VITAMIN C) 500 MG tablet Take 500 mg by mouth daily.     cyclobenzaprine (FLEXERIL) 5 MG tablet TAKE 1 TABLET BY MOUTH THREE TIMES A DAY AS NEEDED FOR MUSCLE SPASM 30 tablet 2   fentaNYL (DURAGESIC) 25 MCG/HR Place 1 patch onto the skin every other day. 15 patch 0   fentaNYL (DURAGESIC) 50 MCG/HR Place 1 patch onto the skin every other day. 10 patch 0   HYDROmorphone (DILAUDID) 4 MG tablet Take 1 tablet (4 mg total) by mouth every 4 (four) hours as needed for severe pain. 120 tablet 0   LORazepam (ATIVAN) 1 MG tablet Take 1 tablet (1 mg total) by mouth at bedtime as needed for anxiety or sleep. 30 tablet 0   megestrol (MEGACE ES) 625 MG/5ML suspension TAKE 5 MLS (625 MG TOTAL) BY MOUTH 2 (TWO) TIMES DAILY. DISCARD ANY REMAINDER (Patient not taking: Reported on 03/25/2023) 900 mL 4   meloxicam (MOBIC) 15 MG tablet Take 1 tablet (15 mg total) by mouth daily. 30 tablet 1   Multiple Vitamins-Minerals (ONE-A-DAY WOMENS 50 PLUS) TABS Take 1 tablet by mouth daily.  ondansetron (ZOFRAN) 8 MG tablet Take 1 tablet (8 mg total) by mouth every 8 (eight) hours as needed for nausea or vomiting. 90 tablet 1   polyethylene glycol (MIRALAX) 17 g packet Take 17 g by mouth daily as needed. (Patient not taking: Reported on 03/25/2023) 14 each 0   prochlorperazine (COMPAZINE) 10 MG tablet Take 1 tablet (10 mg total) by mouth every 6 (six) hours as needed for nausea or vomiting. 30 tablet 0   No current facility-administered medications for this visit.   Facility-Administered Medications  Ordered in Other Visits  Medication Dose Route Frequency Provider Last Rate Last Admin   HYDROmorphone (DILAUDID) injection 2 mg  2 mg Intravenous Q2H PRN Josph Macho, MD        VITAL SIGNS: There were no vitals taken for this visit. There were no vitals filed for this visit.  Estimated body mass index is 25.09 kg/m as calculated from the following:   Height as of 03/25/23: 5\' 8"  (1.727 m).   Weight as of 03/25/23: 165 lb (74.8 kg).   PERFORMANCE STATUS (ECOG) : 1 - Symptomatic but completely ambulatory   IMPRESSION:  I connected with Annette James by phone for follow-up. No acute distress identified. She is doing well overall. Shares she has started new treatment and tolerating. Some fatigue however resolves on her off week. Denies nausea, vomiting, constipation, or diarrhea.   Neoplasm related pain Annette James reports her pain is well controlled on current regimen.    We discussed her current regimen at length: Fentanyl 75 mcg patch every 48 hours, hydromorphone 4mg  as needed every 4 hours for breakthrough, and flexeril as needed for spasms. Mobic 15mg  daily. Patient's Fentanyl patch was recently increased to on 7/8 and she was switched to hydromorphone after visit with Dr. Myna Hidalgo.     Constipation Controlled with stool softeners. She has Senna on hand at home. Education provided on the importance of bowel regimen in the setting of opioid use.    Goals of Care 4/11- We discussed her current illness and what it means in the larger context of her on-going co-morbidities. Natural disease trajectory and expectations were discussed.   Annette James and her daughter are realistic in their understanding. She is clear in expressed goals to continue to treat aggressively allowing her every opportunity to continue to thrive. She is hopeful for a response to newer treatment.  I discussed the importance of continued conversation with family and their medical providers regarding  overall plan of care and treatment options, ensuring decisions are within the context of the patients values and GOCs.  PLAN: Fentanyl 75 mcg patch (Adjusted per Dr. Myna Hidalgo) Hydromorphone 4mg  hours as needed for breakthrough. (Adjusted per Dr. Myna Hidalgo) Flexeril as needed for spasms Mobic 15mg  daily  Daily bowel regimen Symptom management support as needed. Patient's medications recently adjusted appropriately by Dr. Myna Hidalgo. Palliative available as needed. Will not schedule follow-up at this time to minimize multiple providers making adjustments and PDMP notifications for changes and prescribers.    Patient expressed understanding and was in agreement with this plan. She also understands that She can call the clinic at any time with any questions, concerns, or complaints.   Any controlled substances utilized were prescribed in the context of palliative care. PDMP has been reviewed.    Visit consisted of counseling and education dealing with the complex and emotionally intense issues of symptom management and palliative care in the setting of serious and potentially life-threatening illness.Greater than 50%  of this time was spent counseling and coordinating care related to the above assessment and plan.  Willette Alma, AGPCNP-BC  Palliative Medicine Team/[facility]  *Please note that this is a verbal dictation therefore any spelling or grammatical errors are due to the "[employer]agon [employer] One" system interpretation.

## 2023-04-21 ENCOUNTER — Inpatient Hospital Stay: Payer: BC Managed Care – PPO | Admitting: Nurse Practitioner

## 2023-04-21 ENCOUNTER — Encounter: Payer: Self-pay | Admitting: Nurse Practitioner

## 2023-04-21 DIAGNOSIS — G893 Neoplasm related pain (acute) (chronic): Secondary | ICD-10-CM | POA: Diagnosis not present

## 2023-04-21 DIAGNOSIS — C50919 Malignant neoplasm of unspecified site of unspecified female breast: Secondary | ICD-10-CM | POA: Diagnosis not present

## 2023-04-21 DIAGNOSIS — Z515 Encounter for palliative care: Secondary | ICD-10-CM

## 2023-04-21 DIAGNOSIS — C7951 Secondary malignant neoplasm of bone: Secondary | ICD-10-CM | POA: Diagnosis not present

## 2023-04-26 ENCOUNTER — Emergency Department (HOSPITAL_COMMUNITY): Payer: BC Managed Care – PPO

## 2023-04-26 ENCOUNTER — Encounter (HOSPITAL_COMMUNITY): Payer: Self-pay | Admitting: Emergency Medicine

## 2023-04-26 ENCOUNTER — Inpatient Hospital Stay (HOSPITAL_COMMUNITY)
Admission: EM | Admit: 2023-04-26 | Discharge: 2023-04-28 | DRG: 948 | Disposition: A | Discharge status: Home / Self Care | Payer: BC Managed Care – PPO | Attending: Internal Medicine | Admitting: Internal Medicine

## 2023-04-26 ENCOUNTER — Other Ambulatory Visit: Payer: Self-pay

## 2023-04-26 ENCOUNTER — Observation Stay (HOSPITAL_COMMUNITY): Payer: BC Managed Care – PPO

## 2023-04-26 DIAGNOSIS — Z800 Family history of malignant neoplasm of digestive organs: Secondary | ICD-10-CM | POA: Diagnosis not present

## 2023-04-26 DIAGNOSIS — E876 Hypokalemia: Secondary | ICD-10-CM | POA: Diagnosis present

## 2023-04-26 DIAGNOSIS — C50919 Malignant neoplasm of unspecified site of unspecified female breast: Secondary | ICD-10-CM | POA: Diagnosis not present

## 2023-04-26 DIAGNOSIS — C7951 Secondary malignant neoplasm of bone: Secondary | ICD-10-CM | POA: Diagnosis not present

## 2023-04-26 DIAGNOSIS — Z803 Family history of malignant neoplasm of breast: Secondary | ICD-10-CM | POA: Diagnosis not present

## 2023-04-26 DIAGNOSIS — Z8249 Family history of ischemic heart disease and other diseases of the circulatory system: Secondary | ICD-10-CM | POA: Diagnosis not present

## 2023-04-26 DIAGNOSIS — Z811 Family history of alcohol abuse and dependence: Secondary | ICD-10-CM

## 2023-04-26 DIAGNOSIS — C787 Secondary malignant neoplasm of liver and intrahepatic bile duct: Secondary | ICD-10-CM | POA: Diagnosis not present

## 2023-04-26 DIAGNOSIS — I10 Essential (primary) hypertension: Secondary | ICD-10-CM | POA: Diagnosis present

## 2023-04-26 DIAGNOSIS — Z79891 Long term (current) use of opiate analgesic: Secondary | ICD-10-CM

## 2023-04-26 DIAGNOSIS — E785 Hyperlipidemia, unspecified: Secondary | ICD-10-CM | POA: Diagnosis not present

## 2023-04-26 DIAGNOSIS — Z923 Personal history of irradiation: Secondary | ICD-10-CM | POA: Diagnosis not present

## 2023-04-26 DIAGNOSIS — C7952 Secondary malignant neoplasm of bone marrow: Secondary | ICD-10-CM | POA: Diagnosis present

## 2023-04-26 DIAGNOSIS — T40415A Adverse effect of fentanyl or fentanyl analogs, initial encounter: Secondary | ICD-10-CM | POA: Diagnosis present

## 2023-04-26 DIAGNOSIS — R000 Tachycardia, unspecified: Secondary | ICD-10-CM | POA: Diagnosis not present

## 2023-04-26 DIAGNOSIS — K5903 Drug induced constipation: Secondary | ICD-10-CM | POA: Diagnosis present

## 2023-04-26 DIAGNOSIS — Z79899 Other long term (current) drug therapy: Secondary | ICD-10-CM | POA: Diagnosis not present

## 2023-04-26 DIAGNOSIS — R52 Pain, unspecified: Secondary | ICD-10-CM | POA: Insufficient documentation

## 2023-04-26 DIAGNOSIS — D638 Anemia in other chronic diseases classified elsewhere: Secondary | ICD-10-CM | POA: Diagnosis not present

## 2023-04-26 DIAGNOSIS — G893 Neoplasm related pain (acute) (chronic): Principal | ICD-10-CM | POA: Diagnosis present

## 2023-04-26 DIAGNOSIS — Z515 Encounter for palliative care: Secondary | ICD-10-CM

## 2023-04-26 DIAGNOSIS — R5381 Other malaise: Secondary | ICD-10-CM | POA: Diagnosis present

## 2023-04-26 DIAGNOSIS — Z9221 Personal history of antineoplastic chemotherapy: Secondary | ICD-10-CM

## 2023-04-26 DIAGNOSIS — M25561 Pain in right knee: Secondary | ICD-10-CM | POA: Diagnosis not present

## 2023-04-26 DIAGNOSIS — M25562 Pain in left knee: Secondary | ICD-10-CM | POA: Diagnosis not present

## 2023-04-26 DIAGNOSIS — K769 Liver disease, unspecified: Secondary | ICD-10-CM | POA: Diagnosis not present

## 2023-04-26 LAB — COMPREHENSIVE METABOLIC PANEL
ALT: 16 U/L (ref 0–44)
AST: 58 U/L — ABNORMAL HIGH (ref 15–41)
Albumin: 2.8 g/dL — ABNORMAL LOW (ref 3.5–5.0)
Alkaline Phosphatase: 99 U/L (ref 38–126)
Anion gap: 9 (ref 5–15)
BUN: 7 mg/dL (ref 6–20)
CO2: 23 mmol/L (ref 22–32)
Calcium: 8.5 mg/dL — ABNORMAL LOW (ref 8.9–10.3)
Chloride: 103 mmol/L (ref 98–111)
Creatinine, Ser: 0.61 mg/dL (ref 0.44–1.00)
GFR, Estimated: >60 mL/min (ref >60)
Glucose, Bld: 107 mg/dL — ABNORMAL HIGH (ref 70–99)
Potassium: 3.3 mmol/L — ABNORMAL LOW (ref 3.5–5.1)
Sodium: 135 mmol/L (ref 135–145)
Total Bilirubin: 0.5 mg/dL (ref 0.3–1.2)
Total Protein: 5.4 g/dL — ABNORMAL LOW (ref 6.5–8.1)

## 2023-04-26 LAB — CBC WITH DIFFERENTIAL/PLATELET
Abs Immature Granulocytes: 0.15 10*3/uL — ABNORMAL HIGH (ref 0.00–0.07)
Basophils Absolute: 0 10*3/uL (ref 0.0–0.1)
Basophils Relative: 1 %
Eosinophils Absolute: 0.1 10*3/uL (ref 0.0–0.5)
Eosinophils Relative: 1 %
HCT: 29.1 % — ABNORMAL LOW (ref 36.0–46.0)
Hemoglobin: 9.4 g/dL — ABNORMAL LOW (ref 12.0–15.0)
Immature Granulocytes: 3 %
Lymphocytes Relative: 11 %
Lymphs Abs: 0.6 10*3/uL — ABNORMAL LOW (ref 0.7–4.0)
MCH: 31.5 pg (ref 26.0–34.0)
MCHC: 32.3 g/dL (ref 30.0–36.0)
MCV: 97.7 fL (ref 80.0–100.0)
Monocytes Absolute: 0.8 10*3/uL (ref 0.1–1.0)
Monocytes Relative: 17 %
Neutro Abs: 3.2 10*3/uL (ref 1.7–7.7)
Neutrophils Relative %: 67 %
Platelets: 257 10*3/uL (ref 150–400)
RBC: 2.98 MIL/uL — ABNORMAL LOW (ref 3.87–5.11)
RDW: 19.5 % — ABNORMAL HIGH (ref 11.5–15.5)
WBC: 4.8 10*3/uL (ref 4.0–10.5)
nRBC: 0.4 % — ABNORMAL HIGH (ref 0.0–0.2)

## 2023-04-26 LAB — LIPASE, BLOOD: Lipase: 24 U/L (ref 11–51)

## 2023-04-26 LAB — MAGNESIUM: Magnesium: 1.6 mg/dL — ABNORMAL LOW (ref 1.7–2.4)

## 2023-04-26 MED ORDER — FENTANYL 75 MCG/HR TD PT72
1.0000 | MEDICATED_PATCH | TRANSDERMAL | Status: DC
  Administered 2023-04-26: 1 via TRANSDERMAL
  Filled 2023-04-26: qty 1

## 2023-04-26 MED ORDER — SODIUM CHLORIDE 0.9% FLUSH: 9.0000 mL | INTRAVENOUS | Status: DC | PRN

## 2023-04-26 MED ORDER — KETOROLAC TROMETHAMINE 15 MG/ML IJ SOLN
30.0000 mg | Freq: Three times a day (TID) | INTRAMUSCULAR | Status: DC
  Administered 2023-04-26 – 2023-04-27 (×2): 30 mg via INTRAVENOUS
  Filled 2023-04-26 (×2): qty 2

## 2023-04-26 MED ORDER — ONDANSETRON HCL 4 MG PO TABS: 8.0000 mg | ORAL_TABLET | Freq: Three times a day (TID) | ORAL | Status: DC | PRN

## 2023-04-26 MED ORDER — NALOXONE HCL 0.4 MG/ML IJ SOLN: 0.4000 mg | INTRAMUSCULAR | Status: DC | PRN

## 2023-04-26 MED ORDER — LACTATED RINGERS IV BOLUS
1000.0000 mL | Freq: Once | INTRAVENOUS | Status: AC
  Administered 2023-04-26: 1000 mL via INTRAVENOUS

## 2023-04-26 MED ORDER — DIPHENHYDRAMINE HCL 12.5 MG/5ML PO ELIX: 12.5000 mg | ORAL_SOLUTION | Freq: Four times a day (QID) | ORAL | Status: DC | PRN

## 2023-04-26 MED ORDER — HYDROMORPHONE HCL 2 MG PO TABS: 4.0000 mg | ORAL_TABLET | ORAL | Status: DC | PRN

## 2023-04-26 MED ORDER — HYDROMORPHONE HCL 1 MG/ML IJ SOLN
1.0000 mg | Freq: Once | INTRAMUSCULAR | Status: AC
  Administered 2023-04-26: 1 mg via INTRAVENOUS
  Filled 2023-04-26: qty 1

## 2023-04-26 MED ORDER — KETOROLAC TROMETHAMINE 15 MG/ML IJ SOLN
15.0000 mg | Freq: Once | INTRAMUSCULAR | Status: AC
  Administered 2023-04-26: 15 mg via INTRAVENOUS
  Filled 2023-04-26: qty 1

## 2023-04-26 MED ORDER — PROCHLORPERAZINE MALEATE 10 MG PO TABS: 10.0000 mg | ORAL_TABLET | Freq: Four times a day (QID) | ORAL | Status: DC | PRN

## 2023-04-26 MED ORDER — DEXAMETHASONE SODIUM PHOSPHATE 10 MG/ML IJ SOLN
10.0000 mg | Freq: Once | INTRAMUSCULAR | Status: AC
  Administered 2023-04-26: 10 mg via INTRAVENOUS
  Filled 2023-04-26: qty 1

## 2023-04-26 MED ORDER — IOHEXOL 350 MG/ML SOLN
75.0000 mL | Freq: Once | INTRAVENOUS | Status: AC | PRN
  Administered 2023-04-26: 75 mL via INTRAVENOUS

## 2023-04-26 MED ORDER — ONDANSETRON HCL 4 MG/2ML IJ SOLN
4.0000 mg | Freq: Four times a day (QID) | INTRAMUSCULAR | Status: DC | PRN
  Administered 2023-04-26: 4 mg via INTRAVENOUS
  Filled 2023-04-26: qty 2

## 2023-04-26 MED ORDER — ZOLEDRONIC ACID 4 MG/100ML IV SOLN
4.0000 mg | Freq: Once | INTRAVENOUS | Status: DC
  Filled 2023-04-26 (×2): qty 100

## 2023-04-26 MED ORDER — CYCLOBENZAPRINE HCL 10 MG PO TABS: 10.0000 mg | ORAL_TABLET | Freq: Three times a day (TID) | ORAL | Status: DC | PRN

## 2023-04-26 MED ORDER — SODIUM CHLORIDE 0.9 % IV SOLN
4.0000 mg | Freq: Once | INTRAVENOUS | Status: DC
Start: 2023-04-26 — End: 2023-04-26

## 2023-04-26 MED ORDER — DIPHENHYDRAMINE HCL 50 MG/ML IJ SOLN: 12.5000 mg | Freq: Four times a day (QID) | INTRAMUSCULAR | Status: DC | PRN

## 2023-04-26 MED ORDER — FENTANYL 50 MCG/ML IV PCA SOLN
INTRAVENOUS | Status: DC
  Administered 2023-04-26: 30 ug via INTRAVENOUS
  Filled 2023-04-26 (×2): qty 25

## 2023-04-26 MED ORDER — FENTANYL 25 MCG/HR TD PT72: 1.0000 | MEDICATED_PATCH | TRANSDERMAL | Status: DC

## 2023-04-26 MED ORDER — HYDROMORPHONE HCL 1 MG/ML IJ SOLN: 1.0000 mg | INTRAMUSCULAR | Status: DC | PRN

## 2023-04-26 MED ORDER — POLYETHYLENE GLYCOL 3350 17 G PO PACK: 17.0000 g | PACK | Freq: Every day | ORAL | Status: DC | PRN

## 2023-04-26 MED ORDER — FENTANYL 50 MCG/HR TD PT72: 1.0000 | MEDICATED_PATCH | TRANSDERMAL | Status: DC

## 2023-04-26 MED ORDER — ACETAMINOPHEN 500 MG PO TABS
1000.0000 mg | ORAL_TABLET | Freq: Three times a day (TID) | ORAL | Status: DC
  Administered 2023-04-26 – 2023-04-28 (×6): 1000 mg via ORAL
  Filled 2023-04-26 (×6): qty 2

## 2023-04-26 MED ORDER — HYDROMORPHONE HCL 1 MG/ML IJ SOLN
3.0000 mg | INTRAMUSCULAR | Status: DC | PRN
  Administered 2023-04-26 – 2023-04-27 (×2): 3 mg via INTRAVENOUS
  Filled 2023-04-26 (×2): qty 3

## 2023-04-26 MED ORDER — LORAZEPAM 1 MG PO TABS: 1.0000 mg | ORAL_TABLET | Freq: Every evening | ORAL | Status: DC | PRN

## 2023-04-26 MED ORDER — CYCLOBENZAPRINE HCL 10 MG PO TABS: 5.0000 mg | ORAL_TABLET | Freq: Three times a day (TID) | ORAL | Status: DC | PRN

## 2023-04-26 MED ORDER — ZOLEDRONIC ACID 4 MG/5ML IV CONC
4.0000 mg | Freq: Once | INTRAVENOUS | Status: DC
  Filled 2023-04-26 (×2): qty 5

## 2023-04-26 MED ORDER — ACETAMINOPHEN 325 MG PO TABS
650.0000 mg | ORAL_TABLET | Freq: Once | ORAL | Status: AC
  Administered 2023-04-26: 650 mg via ORAL
  Filled 2023-04-26: qty 2

## 2023-04-26 MED ORDER — ENOXAPARIN SODIUM 40 MG/0.4ML IJ SOSY
40.0000 mg | PREFILLED_SYRINGE | INTRAMUSCULAR | Status: DC
  Administered 2023-04-26 – 2023-04-27 (×2): 40 mg via SUBCUTANEOUS
  Filled 2023-04-26 (×2): qty 0.4

## 2023-04-26 MED ORDER — SENNA 8.6 MG PO TABS
1.0000 | ORAL_TABLET | Freq: Every day | ORAL | Status: DC
  Administered 2023-04-26 – 2023-04-27 (×2): 8.6 mg via ORAL
  Filled 2023-04-26 (×2): qty 1

## 2023-04-26 MED ORDER — ZOLEDRONIC ACID 4 MG/100ML IV SOLN
4.0000 mg | Freq: Once | INTRAVENOUS | Status: AC
  Administered 2023-04-26: 4 mg via INTRAVENOUS
  Filled 2023-04-26: qty 100

## 2023-04-26 MED ORDER — DEXAMETHASONE 4 MG PO TABS
4.0000 mg | ORAL_TABLET | Freq: Every day | ORAL | Status: DC
  Administered 2023-04-27 – 2023-04-28 (×2): 4 mg via ORAL
  Filled 2023-04-26 (×2): qty 1

## 2023-04-26 MED ORDER — POTASSIUM CHLORIDE CRYS ER 20 MEQ PO TBCR
40.0000 meq | EXTENDED_RELEASE_TABLET | Freq: Once | ORAL | Status: AC
  Administered 2023-04-26: 40 meq via ORAL
  Filled 2023-04-26: qty 2

## 2023-04-26 MED ORDER — MAGNESIUM SULFATE 2 GM/50ML IV SOLN
2.0000 g | Freq: Once | INTRAVENOUS | Status: AC
  Administered 2023-04-26: 2 g via INTRAVENOUS
  Filled 2023-04-26: qty 50

## 2023-04-26 MED ORDER — ZOLEDRONIC ACID 4 MG/100ML IV SOLN
4.0000 mg | Freq: Once | INTRAVENOUS | Status: DC
  Filled 2023-04-26: qty 100

## 2023-04-26 NOTE — ED Triage Notes (Signed)
Patient with hx breast CA metastasized to bones arrives for eval of severe back and joint pain r/t same. Recent adjustment in pain medication but woke up at 0200 today in excruciating pain. Currently doing chemo, last tx on 7/5. Has Fentanyl patch on and has taken two doses of dilaudid since 0200 today.

## 2023-04-26 NOTE — H&P (Signed)
History and Physical    Annette James [id number] DOB: 1961-03-28 DOA: 04/26/2023  PCP: Etta Grandchild, MD (Confirm with patient/family/NH records and if not entered, this has to be entered at [facility] point of entry) Patient coming from: Home  I have personally briefly reviewed patient's old medical records in [facility]  Chief Complaint: Unbearable back pain  HPI: Annette James is a 62 y.o. 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  At baseline, patient has a chronic neck/bilateral shoulder/back pain related to metastatic cancer lesions.  She has been on narcotics including fentanyl patch and p.o. Dilaudid.  Recently however cancer pain has become uncontrolled, her oncology Dr. Myna Hidalgo increased her Fentanyl patch dosage from 50 mcg twice daily to 75 mcg twice daily 1 week ago.  Despite, improvement has been limited.  Last night she woke up with severe breakthrough pain same location and distribution but worse.  She also complains about bilateral knee pain which is new but denies any swelling, no fever or chills.  ED Course: Borderline tachycardia, blood pressure elevated afebrile.  CT angiogram negative for PE but again demonstrated diffuse metastatic bone lesions.  Review of Systems: As per HPI otherwise 14 point review of systems negative.    Past Medical History:  Diagnosis Date   Breast cancer metastasized to bone, unspecified laterality (HCC) 11/01/2020   radiation txt to spine 11/13/2020-11/29/2020  Dr Roselind Messier   Chicken pox    Class 1 obesity 12/16/2020   Colitis 12/2020   Goals of care, counseling/discussion 10/19/2020   History of radiation therapy 11/13/2020-11/29/2020   left hip   Dr Antony Blackbird   History of radiation therapy    right hip 07/07/2021-07/24/2021  Dr Antony Blackbird   History of  radiation therapy    Right Pelvis(Hip) 11/10/21-11/21/21- Dr. Antony Blackbird   History of radiation therapy    Thoracic Spine 07/01/22-07/14/22- Dr. Antony Blackbird   Metastatic cancer to spine [facility]) 10/19/2020    Past Surgical History:  Procedure Laterality Date   APPENDECTOMY     CESAREAN SECTION     x 4   CHOLECYSTECTOMY     IR IMAGING GUIDED PORT INSERTION  01/06/2023   IR US GUIDE BX ASP/DRAIN  01/06/2023     reports that she has never smoked. She has never used smokeless tobacco. She reports that she does not currently use alcohol. She reports that she does not currently use drugs.  No Known Allergies  Family History  Problem Relation Age of Onset   Breast cancer Mother    Hypertension Mother    Colon cancer Father    Hypertension Father    Alcoholism Brother    Alcoholism Brother      Prior to Admission medications   Medication Sig Start Date End Date Taking? Authorizing Provider  ascorbic acid (VITAMIN C) 500 MG tablet Take 500 mg by mouth daily.   Yes [provider]  cyclobenzaprine (FLEXERIL) 5 MG tablet TAKE 1 TABLET BY MOUTH THREE TIMES A DAY AS NEEDED FOR MUSCLE SPASM Patient taking differently: Take 5 mg by mouth 3 (three) times daily as needed for muscle spasms. 03/05/23  Yes Josph Macho, MD  fentaNYL (DURAGESIC) 25 MCG/HR Place 1 patch onto the skin every other day. 04/19/23  Yes Josph Macho, MD  fentaNYL (DURAGESIC) 50 MCG/HR Place 1 patch onto the  skin every other day. 03/18/23  Yes Pickenpack-Cousar, Arty Baumgartner, NP  HYDROmorphone (DILAUDID) 4 MG tablet Take 1 tablet (4 mg total) by mouth every 4 (four) hours as needed for severe pain. 03/25/23  Yes Ennever, Rose Phi, MD  LORazepam (ATIVAN) 1 MG tablet Take 1 tablet (1 mg total) by mouth at bedtime as needed for anxiety or sleep. 04/16/23  Yes Josph Macho, MD  Multiple Vitamins-Minerals (ONE-A-DAY WOMENS 50 PLUS) TABS Take 1 tablet by mouth daily.   Yes [provider]  ondansetron (ZOFRAN)  8 MG tablet Take 1 tablet (8 mg total) by mouth every 8 (eight) hours as needed for nausea or vomiting. 01/22/23  Yes Ennever, Rose Phi, MD  prochlorperazine (COMPAZINE) 10 MG tablet Take 1 tablet (10 mg total) by mouth every 6 (six) hours as needed for nausea or vomiting. 04/06/23  Yes Ennever, Rose Phi, MD  megestrol (MEGACE ES) 625 MG/5ML suspension TAKE 5 MLS (625 MG TOTAL) BY MOUTH 2 (TWO) TIMES DAILY. DISCARD ANY REMAINDER Patient not taking: Reported on 03/25/2023 03/14/23   Josph Macho, MD  meloxicam (MOBIC) 15 MG tablet Take 1 tablet (15 mg total) by mouth daily. Patient not taking: Reported on 04/26/2023 03/23/23   Pickenpack-Cousar, Arty Baumgartner, NP  polyethylene glycol (MIRALAX) 17 g packet Take 17 g by mouth daily as needed. Patient not taking: Reported on 03/25/2023 01/08/23   Briant Cedar, MD    Physical Exam: Vitals:   04/26/23 1030 04/26/23 1100 04/26/23 1130 04/26/23 1200  BP: 128/81 134/83  127/79  Pulse: (!) 106 (!) 105  (!) 109  Resp: 18 16  16   Temp:   98.1 F (36.7 C)   TempSrc:   Oral   SpO2: 97% 98%  98%  Weight:      Height:        Constitutional: NAD, calm, comfortable Vitals:   04/26/23 1030 04/26/23 1100 04/26/23 1130 04/26/23 1200  BP: 128/81 134/83  127/79  Pulse: (!) 106 (!) 105  (!) 109  Resp: 18 16  16   Temp:   98.1 F (36.7 C)   TempSrc:   Oral   SpO2: 97% 98%  98%  Weight:      Height:       Eyes: PERRL, lids and conjunctivae normal ENMT: Mucous membranes are moist. Posterior pharynx clear of any exudate or lesions.Normal dentition.  Neck: normal, supple, no masses, no thyromegaly Respiratory: clear to auscultation bilaterally, no wheezing, no crackles. Normal respiratory effort. No accessory muscle use.  Cardiovascular: Regular rate and rhythm, no murmurs / rubs / gallops. No extremity edema. 2+ pedal pulses. No carotid bruits.  Abdomen: no tenderness, no masses palpated. No hepatosplenomegaly. Bowel sounds positive.  Musculoskeletal: no  clubbing / cyanosis. No joint deformity upper and lower extremities. Good ROM, no contractures. Normal muscle tone.  Skin: no rashes, lesions, ulcers. No induration Neurologic: CN 2-12 grossly intact. Sensation intact, DTR normal. Strength 5/5 in all 4.  Psychiatric: Normal judgment and insight. Alert and oriented x 3. Normal mood.     Labs on Admission: I have personally reviewed following labs and imaging studies  CBC: Recent Labs  Lab 04/26/23 0805  WBC 4.8  NEUTROABS 3.2  HGB 9.4*  HCT 29.1*  MCV 97.7  PLT 257   Basic Metabolic Panel: Recent Labs  Lab 04/26/23 0805  NA 135  K 3.3*  CL 103  CO2 23  GLUCOSE 107*  BUN 7  CREATININE 0.61  CALCIUM 8.5*  MG  1.6*   GFR: Estimated Creatinine Clearance: 76.4 mL/min (by C-G formula based on SCr of 0.61 mg/dL). Liver Function Tests: Recent Labs  Lab 04/26/23 0805  AST 58*  ALT 16  ALKPHOS 99  BILITOT 0.5  PROT 5.4*  ALBUMIN 2.8*   Recent Labs  Lab 04/26/23 0805  LIPASE 24   No results for input(s): "AMMONIA" in the last 168 hours. Coagulation Profile: No results for input(s): "INR", "PROTIME" in the last 168 hours. Cardiac Enzymes: No results for input(s): "CKTOTAL", "CKMB", "CKMBINDEX", "TROPONINI" in the last 168 hours. BNP (last 3 results) No results for input(s): "PROBNP" in the last 8760 hours. HbA1C: No results for input(s): "HGBA1C" in the last 72 hours. CBG: No results for input(s): "GLUCAP" in the last 168 hours. Lipid Profile: No results for input(s): "CHOL", "HDL", "LDLCALC", "TRIG", "CHOLHDL", "LDLDIRECT" in the last 72 hours. Thyroid Function Tests: No results for input(s): "TSH", "T4TOTAL", "FREET4", "T3FREE", "THYROIDAB" in the last 72 hours. Anemia Panel: No results for input(s): "VITAMINB12", "FOLATE", "FERRITIN", "TIBC", "IRON", "RETICCTPCT" in the last 72 hours. Urine analysis:    Component Value Date/Time   COLORURINE AMBER (A) 10/18/2020 0819   APPEARANCEUR CLOUDY (A) 10/18/2020  0819   LABSPEC 1.028 10/18/2020 0819   PHURINE 5.0 10/18/2020 0819   GLUCOSEU NEGATIVE 10/18/2020 0819   HGBUR NEGATIVE 10/18/2020 0819   BILIRUBINUR NEGATIVE 10/18/2020 0819   KETONESUR 5 (A) 10/18/2020 0819   PROTEINUR 100 (A) 10/18/2020 0819   NITRITE NEGATIVE 10/18/2020 0819   LEUKOCYTESUR NEGATIVE 10/18/2020 0819    Radiological Exams on Admission: CT Angio Chest PE W and/or Wo Contrast  Result Date: 04/26/2023 CLINICAL DATA:  Pulmonary embolism (PE) suspected, high prob EXAM: CT ANGIOGRAPHY CHEST WITH CONTRAST TECHNIQUE: Multidetector CT imaging of the chest was performed using the standard protocol during bolus administration of intravenous contrast. Multiplanar CT image reconstructions and MIPs were obtained to evaluate the vascular anatomy. RADIATION DOSE REDUCTION: This exam was performed according to the departmental dose-optimization program which includes automated exposure control, adjustment of the mA and/or kV according to patient size and/or use of iterative reconstruction technique. CONTRAST:  75mL OMNIPAQUE IOHEXOL 350 MG/ML SOLN COMPARISON:  CT scan chest from 03/12/2023. FINDINGS: Cardiovascular: No evidence of embolism to the proximal subsegmental pulmonary artery level. Normal cardiac size. No pericardial effusion. No aortic aneurysm. Mediastinum/Nodes: Visualized thyroid gland appears grossly unremarkable. No solid / cystic mediastinal masses. Small amount of fluid noted in the periaortic recess. The esophagus is nondistended precluding optimal assessment. No axillary, mediastinal or hilar lymphadenopathy by size criteria. Lungs/Pleura: The central tracheo-bronchial tree is patent. There are dependent changes in bilateral lungs. No mass or consolidation. No pleural effusion or pneumothorax. No suspicious lung nodules. Upper Abdomen: Redemonstration of an oval approximately 1.9 x 2.2 cm lesion in the right hepatic lobe, incompletely characterized on the current examination but  grossly similar to the prior study. There are additional at least 2, subcapsular hypoattenuating lesions in the right lobe (marked with electronic arrow sign on series 5), which are also indeterminate but similar to the prior study. Visualized remaining upper abdominal viscera within normal limits. Musculoskeletal: A CT Port-a-Cath is seen in the right upper chest wall with the catheter terminating in the cavo-atrial junction region. Visualized soft tissues of the chest wall are otherwise grossly unremarkable. Redemonstration of diffuse sclerotic metastases. No pathological fracture. There are mild to moderate multilevel degenerative changes in the visualized spine. Redemonstration of mild-to-moderate anterior wedging deformity of T8 vertebral body, similar to the prior  study. Review of the MIP images confirms the above findings. IMPRESSION: 1. No evidence of pulmonary embolism. No acute intrathoracic pathology identified. 2. Redemonstration of diffuse sclerotic metastases. No pathological fracture. 3. Redemonstration of indeterminate hepatic lesions, grossly similar to the prior study. These could be better assessed with dedicated hepatic protocol MRI examination. Electronically Signed   By: Jules Schick M.D.   On: 04/26/2023 12:24    EKG: Independently reviewed.  Sinus rhythm, no acute ST changes.  Assessment/Plan Principal Problem:   Cancer-related breakthrough pain Active Problems:   Breast cancer metastasized to bone, unspecified laterality (HCC)   Intractable pain  (please populate well all problems here in Problem List. (For example, if patient is on BP meds at home and you resume or decide to hold them, it is a problem that needs to be her. Same for CAD, COPD, HLD and so on)  Acute on chronic cancer related skeletal pain Metastatic breast cancer to bones and liver -Already on high-dose of narcotics, will consult palliative care and oncology to help with for pain management.  For now start IV  Dilaudid 1 mg every 2 hours as needed for breakthrough pain -Continue p.o. Dilaudid and fentanyl patch  Chronic transaminitis -Secondary to metastatic breast cancer to liver.  No signs of liver failure  Hypokalemia -P.o. replacement  Chronic normocytic anemia secondary to chronic illness -H&H stable, as needed transfusion  DVT prophylaxis: Lovenox Code Status: Full code Family Communication: Daughter at bedside Disposition Plan: Expect less than 2 midnight hospital stay Consults called: Oncology: Palliative care Admission status: MedSurg observation   Emeline General MD [facility] Pager [phone]  04/26/2023, 1:14 PM

## 2023-04-26 NOTE — ED Notes (Signed)
ED TO INPATIENT HANDOFF REPORT  ED Nurse Name and Phone #: Donny Pique, RN [phone]  S Name/Age/Gender Annette James 62 y.o. female Room/Bed: 038C/038C  Code Status   Code Status: Full Code  Home/SNF/Other Home Patient oriented to: self, place, time, and situation Is this baseline? 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 Recent adjustment in pain medication but woke up at 0200 today in excruciating pain. Currently doing chemo, last tx on 7/5. Has Fentanyl patch on and has taken two doses of dilaudid since 0200 today.    Allergies No Known Allergies  Level of Care/Admitting Diagnosis ED Disposition     ED Disposition  Admit   Condition  --   Comment  Hospital Area: [facility] [[id number]]  Level of Care: Med-Surg [16]  May place patient in observation at [facility] or [facility] if equivalent level of care is available:: No  Covid Evaluation: Asymptomatic - no recent exposure (last 10 days) testing not required  Diagnosis: Cancer-related breakthrough pain [1191478]  Admitting Physician: Emeline General [[id number]]  Attending Physician: Emeline General [[id number]]          B Medical/Surgery History Past Medical History:  Diagnosis Date   Breast cancer metastasized to bone, unspecified laterality (HCC) 11/01/2020   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 Antony Blackbird   History of radiation therapy    Thoracic Spine  07/01/22-07/14/22- Dr. Antony Blackbird   Metastatic cancer to spine [facility]) 10/19/2020   Past Surgical History:  Procedure Laterality Date   APPENDECTOMY     CESAREAN SECTION     x 4   CHOLECYSTECTOMY     IR IMAGING GUIDED PORT INSERTION  01/06/2023   IR US GUIDE BX ASP/DRAIN  01/06/2023     A IV Location/Drains/Wounds Patient Lines/Drains/Airways Status     Active Line/Drains/Airways     Name Placement date Placement time Site Days   Implanted Port 01/06/23 Right Chest 01/06/23  1018  Chest  110   Wound / Incision (Open or Dehisced) 01/06/23 Hip Right;Posterior BM biopsy puncture site 01/06/23  0910  Hip  110   Wound / Incision (Open or Dehisced) 01/06/23 Puncture Neck Right venous access for [facility] 01/06/23  1033  Neck  110   Wound / Incision (Open or Dehisced) 01/06/23 Puncture Sternum Medial;Upper ultrasound guided chest mass biopsy site 01/06/23  1039  Sternum  110            Intake/Output Last 24 hours  Intake/Output Summary (Last 24 hours) at 04/26/2023 1747 Last data filed at 04/26/2023 1302 Gross per 24 hour  Intake 1051.35 ml  Output --  Net 1051.35 ml    Labs/Imaging Results for orders placed or performed during the hospital encounter of 04/26/23 (from the past 48 hour(s))  Comprehensive metabolic panel     Status: Abnormal   Collection Time: 04/26/23  8:05 AM  Result Value Ref Range   Sodium  135 135 - 145 mmol/L   Potassium 3.3 (L) 3.5 - 5.1 mmol/L   Chloride 103 98 - 111 mmol/L   CO2 23 22 - 32 mmol/L   Glucose, Bld 107 (H) 70 - 99 mg/dL    Comment: Glucose reference range applies only to samples taken after fasting for at least 8 hours.   BUN 7 6 - 20 mg/dL   Creatinine, Ser 1.91 0.44 - 1.00 mg/dL   Calcium 8.5 (L) 8.9 - 10.3 mg/dL   Total Protein 5.4 (L) 6.5 - 8.1 g/dL   Albumin 2.8 (L) 3.5 - 5.0 g/dL   AST 58 (H) 15 - 41 U/L   ALT 16 0 - 44 U/L   Alkaline Phosphatase 99 38 - 126 U/L   Total Bilirubin 0.5 0.3 - 1.2 mg/dL   GFR, Estimated >47 >82 mL/min     Comment: (NOTE) Calculated using the CKD-EPI Creatinine Equation (2021)    Anion gap 9 5 - 15    Comment: Performed at [facility], [address] [place], [place] [address]  Lipase, blood     Status: None   Collection Time: 04/26/23  8:05 AM  Result Value Ref Range   Lipase 24 11 - 51 U/L    Comment: Performed at [facility], [address], [place], [place] [address]  CBC with Differential     Status: Abnormal   Collection Time: 04/26/23  8:05 AM  Result Value Ref Range   WBC 4.8 4.0 - 10.5 K/uL   RBC 2.98 (L) 3.87 - 5.11 MIL/uL   Hemoglobin 9.4 (L) 12.0 - 15.0 g/dL   HCT 78.4 (L) 69.6 - 29.5 %   MCV 97.7 80.0 - 100.0 fL   MCH 31.5 26.0 - 34.0 pg   MCHC 32.3 30.0 - 36.0 g/dL   RDW 28.4 (H) 13.2 - 44.0 %   Platelets 257 150 - 400 K/uL   nRBC 0.4 (H) 0.0 - 0.2 %   Neutrophils Relative % 67 %   Neutro Abs 3.2 1.7 - 7.7 K/uL   Lymphocytes Relative 11 %   Lymphs Abs 0.6 (L) 0.7 - 4.0 K/uL   Monocytes Relative 17 %   Monocytes Absolute 0.8 0.1 - 1.0 K/uL   Eosinophils Relative 1 %   Eosinophils Absolute 0.1 0.0 - 0.5 K/uL   Basophils Relative 1 %   Basophils Absolute 0.0 0.0 - 0.1 K/uL   Immature Granulocytes 3 %   Abs Immature Granulocytes 0.15 (H) 0.00 - 0.07 K/uL    Comment: Performed at [facility], [address], [place], [place] [address]  Magnesium     Status: Abnormal   Collection Time: 04/26/23  8:05 AM  Result Value Ref Range   Magnesium 1.6 (L) 1.7 - 2.4 mg/dL    Comment: Performed at [facility], [address], [place], [place] [address]   DG Knee 1-2 Views Left  Result Date: 04/26/2023 CLINICAL DATA:  [id number] Knee pain 125018 EXAM: LEFT KNEE - 1-2 VIEW; RIGHT KNEE - 1-2 VIEW COMPARISON:  None Available. FINDINGS: Extensive sclerotic metastases seen. No acute fracture or dislocation. The knee joints appear within normal limits. No significant arthritis. No knee effusion or focal soft tissue swelling. No radiopaque foreign bodies.  IMPRESSION: *Extensive sclerotic metastases. No acute fracture or dislocation. Electronically Signed   By: Jules Schick M.D.   On: 04/26/2023 14:00   DG Knee 1-2 Views Right  Result Date: 04/26/2023 CLINICAL DATA:  [id number] Knee pain 125018 EXAM: LEFT KNEE - 1-2 VIEW; RIGHT KNEE - 1-2 VIEW COMPARISON:  None Available. FINDINGS: Extensive sclerotic metastases seen. No acute fracture or dislocation. The knee joints appear within normal limits. No significant arthritis. No knee effusion or focal soft tissue swelling. No radiopaque foreign bodies. IMPRESSION: *Extensive sclerotic metastases. No acute fracture or dislocation. Electronically Signed   By: Jules Schick M.D.   On: 04/26/2023 14:00   CT Angio Chest PE W and/or Wo Contrast  Result Date: 04/26/2023 CLINICAL DATA:  Pulmonary embolism (PE) suspected, high prob EXAM: CT ANGIOGRAPHY CHEST WITH CONTRAST TECHNIQUE: Multidetector CT imaging of the chest was performed using the standard protocol during bolus administration of intravenous contrast. Multiplanar CT image reconstructions and MIPs were obtained to evaluate the vascular anatomy. RADIATION DOSE REDUCTION: This exam was performed according to the departmental dose-optimization program which includes automated exposure control, adjustment of the mA and/or kV according to patient size and/or use of iterative reconstruction technique. CONTRAST:  75mL OMNIPAQUE IOHEXOL 350 MG/ML SOLN COMPARISON:  CT scan chest from 03/12/2023. FINDINGS: Cardiovascular: No evidence of embolism to the proximal subsegmental pulmonary artery level. Normal cardiac size. No pericardial effusion. No aortic aneurysm. Mediastinum/Nodes: Visualized thyroid gland appears grossly unremarkable. No solid / cystic mediastinal masses. Small amount of fluid noted in the periaortic recess. The esophagus is nondistended precluding optimal assessment. No axillary, mediastinal or hilar lymphadenopathy by size criteria. Lungs/Pleura: The  central tracheo-bronchial tree is patent. There are dependent changes in bilateral lungs. No mass or consolidation. No pleural effusion or pneumothorax. No suspicious lung nodules. Upper Abdomen: Redemonstration of an oval approximately 1.9 x 2.2 cm lesion in the right hepatic lobe, incompletely characterized on the current examination but grossly similar to the prior study. There are additional at least 2, subcapsular hypoattenuating lesions in the right lobe (marked with electronic arrow sign on series 5), which are also indeterminate but similar to the prior study. Visualized remaining upper abdominal viscera within normal limits. Musculoskeletal: A CT Port-a-Cath is seen in the right upper chest wall with the catheter terminating in the cavo-atrial junction region. Visualized soft tissues of the chest wall are otherwise grossly unremarkable. Redemonstration of diffuse sclerotic metastases. No pathological fracture. There are mild to moderate multilevel degenerative changes in the visualized spine. Redemonstration of mild-to-moderate anterior wedging deformity of T8 vertebral body, similar to the prior study. Review of the MIP images confirms the above findings. IMPRESSION: 1. No evidence of pulmonary embolism. No acute intrathoracic pathology identified. 2. Redemonstration of diffuse sclerotic metastases. No pathological fracture. 3. Redemonstration of indeterminate hepatic lesions, grossly similar to the prior study. These could be better assessed with dedicated hepatic protocol MRI examination. Electronically Signed   By: Jules Schick M.D.   On: 04/26/2023 12:24    Pending Labs Unresulted Labs (From admission, onward)     Start     Ordered   04/27/23 0500  CBC with Differential/Platelet  Daily,   R      04/26/23 1632   04/27/23 0500  Comprehensive metabolic panel  Daily,   R      04/26/23 1632   04/27/23 0500  Cancer antigen 27.29  Tomorrow morning,   R        04/26/23 1632   04/27/23 0500   Magnesium  Daily,   R      04/26/23 1632   04/27/23 0500  Uric acid  Tomorrow morning,   R        04/26/23 1632  04/26/23 1306  HIV Antibody (routine testing w rflx)  (HIV Antibody (Routine testing w reflex) panel)  Once,   R        04/26/23 1306   04/26/23 0746  Urinalysis, Routine w reflex microscopic -Urine, Clean Catch  Once,   URGENT       Question:  Specimen Source  Answer:  Urine, Clean Catch   04/26/23 0746            Vitals/Pain Today's Vitals   04/26/23 1255 04/26/23 1330 04/26/23 1400 04/26/23 1430  BP:  135/85 123/77 134/85  Pulse:  (!) 107 (!) 108 (!) 108  Resp:  13 18 16   Temp:      TempSrc:      SpO2:  98% 99% 99%  Weight:      Height:      PainSc: 7        Isolation Precautions No active isolations  Medications Medications  HYDROmorphone (DILAUDID) tablet 4 mg (has no administration in time range)  LORazepam (ATIVAN) tablet 1 mg (has no administration in time range)  prochlorperazine (COMPAZINE) tablet 10 mg (has no administration in time range)  ondansetron (ZOFRAN) tablet 8 mg (has no administration in time range)  polyethylene glycol (MIRALAX / GLYCOLAX) packet 17 g (has no administration in time range)  enoxaparin (LOVENOX) injection 40 mg (40 mg Subcutaneous Given 04/26/23 1744)  fentaNYL (DURAGESIC) 75 MCG/HR 1 patch (1 patch Transdermal Patch Applied 04/26/23 1457)  dexamethasone (DECADRON) tablet 4 mg (has no administration in time range)  senna (SENOKOT) tablet 8.6 mg (has no administration in time range)  naloxone (NARCAN) injection 0.4 mg (has no administration in time range)    And  sodium chloride flush (NS) 0.9 % injection 9 mL (has no administration in time range)  ondansetron (ZOFRAN) injection 4 mg (4 mg Intravenous Given 04/26/23 1725)  diphenhydrAMINE (BENADRYL) injection 12.5 mg (has no administration in time range)    Or  diphenhydrAMINE (BENADRYL) 12.5 MG/5ML elixir 12.5 mg (has no administration in time range)  fentaNYL 50 mcg/mL  PCA injection (has no administration in time range)  acetaminophen (TYLENOL) tablet 1,000 mg (1,000 mg Oral Given 04/26/23 1743)  HYDROmorphone (DILAUDID) injection 3 mg (3 mg Intravenous Given 04/26/23 1717)  ketorolac (TORADOL) 15 MG/ML injection 30 mg (30 mg Intravenous Given 04/26/23 1736)  cyclobenzaprine (FLEXERIL) tablet 10 mg (has no administration in time range)  zoledronic acid (ZOMETA) 4 mg in sodium chloride 0.9 % 100 mL IVPB (has no administration in time range)  HYDROmorphone (DILAUDID) injection 1 mg (1 mg Intravenous Given 04/26/23 0816)  acetaminophen (TYLENOL) tablet 650 mg (650 mg Oral Given 04/26/23 0816)  ketorolac (TORADOL) 15 MG/ML injection 15 mg (15 mg Intravenous Given 04/26/23 0815)  lactated ringers bolus 1,000 mL (0 mLs Intravenous Stopped 04/26/23 0930)  HYDROmorphone (DILAUDID) injection 1 mg (1 mg Intravenous Given 04/26/23 0946)  potassium chloride SA (KLOR-CON M) CR tablet 40 mEq (40 mEq Oral Given 04/26/23 1103)  magnesium sulfate IVPB 2 g 50 mL (0 g Intravenous Stopped 04/26/23 1302)  iohexol (OMNIPAQUE) 350 MG/ML injection 75 mL (75 mLs Intravenous Contrast Given 04/26/23 1157)  HYDROmorphone (DILAUDID) injection 1 mg (1 mg Intravenous Given 04/26/23 1257)  dexamethasone (DECADRON) injection 10 mg (10 mg Intravenous Given 04/26/23 1734)    Mobility walks     Focused Assessments    R Recommendations: See Admitting Provider Note  Report given to:   Additional Notes: Patient is A&Ox4, here for pain control for oncology.  Patient has cancer mets and is receiveing chemo for it and needs pain control. PCA pump has not been started down here, only given dilaudid, ibuprofen, and tylenol. Family at bedside is attentive and both the patient and family are super sweet!

## 2023-04-26 NOTE — Consult Note (Signed)
Annette James is well-known to me.  She is a very nice 62 year old white female.  She has metastatic breast cancer.  Her cancer has been in her bones and bone marrow.  She initially was treated with CAF.  She subsequently has some evidence of disease progression.  We then treated her with Abraxane.  She had 1 cycle of Abraxane today.  She has had issues with bone pain.  She has extensive bone metastasis.  She gets flareups of bony pain.  She did well over the weekend.  She did not fall.  She was not picking up anything.  She just woke up this morning and had a lot of pain in the upper back across her shoulders.  She has had this in the past.  She also has noted bilateral knee pain.  She is on Duragesic patch.  She is also on some Dilaudid.  She took some Dilaudid but did not have any relief.  She ultimately came to the ER.  She had lab work done which showed a white cell count 4.8.  Hemoglobin 9.4.  Platelet count 257,000.  Her chemistry showed a sodium 135.  Potassium 3.3.  BUN 7 creatinine 0.61.  Calcium 8.5 with albumin of 2.8.  She had a CT angiogram of the chest.  There is no pulmonary embolism.  She had sclerotic metastasis.  There is no pathologic fracture.  There was indeterminate hepatic lesions which were stable.  She had plain x-rays of the knees.  She had again sclerotic metastasis in the long bones.  The knees looked okay.  She is not doing too well right now.  She is on one of the ER beds.  This is very hard for her.  I think palliative care has seen her.  From what Annette James says, they recommended a PCA.  Of note, she was supposed to have chemotherapy this week.  This will be her second cycle of Abraxane.  Her last CA 27-29 was 109.  He has had no fever.  She has had no change in bowel or bladder habits.  There is no cough.  She has had no swallowing difficulties.  She has had no bleeding.  There is been no leg swelling.  She has had no rashes.  Currently, I would say  performance status is probably ECOG 1.   Her vital signs show temperature 98.1.  Pulse 108.  Blood pressure 135/84.  Head and neck exam shows no ocular or oral lesions.  There is no scleral icterus.  There is no adenopathy in the neck.  Her lungs are clear bilaterally.  She has good air movement bilaterally.  Cardiac exam is tachycardic but regular.  She has no murmurs, rubs or bruits.  Abdomen is soft.  Bowel sounds are present.  There is no fluid wave.  There is no palpable liver or spleen tip.  Extremity shows some tenderness to palpation over her long bones.  There is no knee swelling bilaterally.  I cannot detect any swelling in the lower legs.  She has good pulses.  Neurological exam shows no focal neurological deficits.   Annette James is a very charming 62 year old white female with metastatic breast cancer.  Again, her disease is in the bones.  She currently is on Abraxane.  Again she has not had 1 cycle of Abraxane.  I just feel bad that she has had this flareup of the bony pain.  Again we will see any fractures in the x-rays.  Hopefully, we  can get this settled down.  I think anti-inflammatories will certainly help.  I will have her on a scheduled dose of Toradol.  She did get a dose of Decadron.  She is on oral low-dose Decadron.  Again, a PCA might be necessary right now.  I think that Palliative Care might be working on this.  We will have to see what her CA 27.29 shows.  Will be interesting to see how her blood counts look.  She may have come in a little dehydrated.  When we found that she had metastatic disease her bone marrow, and anemia was such huge problem for her.  We are transfusing her.  We have not had a transfusion now probably for over a month.  Again, this is all about quality of life.  This is about comfort, respect and dignity.  I know she is trying really hard.  I know that her family is with her.  There are incredible support for her.  Hopefully, we get this  flareup under control quickly so she will be able to go home.  I know that she is getting incredible care from everybody in the ER.  I am not sure what for she will go to but we will find her and help out.    Annette Bach, MD  Hebrews 12:12

## 2023-04-26 NOTE — Consult Note (Signed)
Consultation Note Date: 04/26/2023   Patient Name: Annette James  DOB: 11-16-1960  MRN: [id number]  Age / Sex: 62 y.o., female  PCP: Annette Grandchild, MD Referring Physician: Emeline General, MD  Reason for Consultation:  pain management  HPI/Patient Profile: 62 y.o. 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 Complains of back pain and knee pain. CT Angio chest negative for PE- demonstrated known liver metastasis and diffuse sclerotic metastasis, multilevel degenerative changes in the spine. Xrays of bilateral knees show diffuse boney metastasis. Palliative consulted for assistance with pain control.    Primary Decision Maker PATIENT - surrogate would be her daughters  Discussion: Chart reviewed including labs, progress notes, imaging from this and previous encounters.  Met at the bedside with Annette James. We discussed her pain. It has been waking her up, no controlled with her home regimen of fentanyl patch 43mcg/hr and po hydromorphone.  She is tearful, noting pain is constant. IV hydromorphone relieves temporarily but pain returns quickly.  Her goal is to have pain managed where she can increase her ability to function, interact with her family, sleep.  We discussed utilizing PCA to obtain true pain needs.  Currently her bowels are moving well.     SUMMARY OF RECOMMENDATIONS -Cancer related pain- bone pain from sclerotic lesions- -Start full dose fentanyl PCA- continue current Fentanyl patch -Dexamethasone- 10mg  IV loading dose then 4mg  po daily starting tomorrow -Senna 1po at bedtime for opioid induced constipation prophylaxis -Acetaminophen 1000mg  po TID    Code Status/Advance Care Planning: Full code   Prognosis:   Unable to determine  Discharge Planning: To Be Determined  Primary  Diagnoses: Present on Admission:  Breast cancer metastasized to bone, unspecified laterality (HCC)  Cancer-related breakthrough pain     Physical Exam Vitals and nursing note reviewed.  Cardiovascular:     Rate and Rhythm: Normal rate.  Pulmonary:     Effort: Pulmonary effort is normal.  Neurological:     Mental Status: She is alert and oriented to person, place, and time.     Vital Signs: BP 134/85   Pulse (!) 108   Temp 98.1 F (36.7 C) (Oral)   Resp 16   Ht 5\' 8"  (1.727 m)   Wt 72.6 kg   SpO2 99%   BMI 24.33 kg/m  Pain Scale: 0-10   Pain Score: 7    SpO2: SpO2: 99 % O2 Device:SpO2: 99 % O2 Flow Rate: .   IO: Intake/output summary:  Intake/Output Summary (Last 24 hours) at 04/26/2023 1614 Last data filed at 04/26/2023 1302 Gross per 24 hour  Intake 1051.35 ml  Output --  Net 1051.35 ml    LBM:   Baseline Weight: Weight: 72.6 kg Most recent weight: Weight: 72.6 kg       Thank you for this consult. Palliative medicine will continue to follow and assist as needed.  Time Total: 80 minutes Greater than 50%  of this time was spent counseling and coordinating care related to the above assessment and plan.  Signed by: Ocie Bob, AGNP-C Palliative Medicine    Please contact Palliative Medicine Team phone at [phone] for questions and concerns.  For individual provider: See Loretha Stapler

## 2023-04-26 NOTE — ED Provider Notes (Incomplete)
[place] EMERGENCY DEPARTMENT AT [facility] Provider Note   CSN: [id number] Arrival date & time: 04/26/23  0730     History {Add pertinent medical, surgical, social history, OB history to HPI:1} No chief complaint on file.   Annette James is a 62 y.o. female.  HPI Patient presents for back pain and arthralgias.  Medical history includes metastatic breast cancer, anemia, HTN.  Her oncologist is Dr. Myna Hidalgo.  She is currently on chemotherapy.  Current home pain regimen is as follows: Every 48 hour fentanyl patches, daily Mobic, and as needed hydromorphone.  Hydromorphone was added in place of Percocet a month ago.  She feels like her pain has been controlled up until today.  Yesterday, she had a good day.  Overnight, she developed severe pain in areas of mid back, posterior shoulders, and left-sided joints.  She feels that this is the typical areas of pain just worse in severity.  She took 2 of the 4 mg oral Dilaudid's this morning, 1 at 2 AM, and 1 at 6 AM.  She continues to have severe pain.  Currently, she has a 50 mcg and a 25 mcg patch of fentanyl which were placed 2 days ago and will be due for change today.  Other than her severe pain, patient denies any other recent symptoms.    Home Medications Prior to Admission medications   Medication Sig Start Date End Date Taking? Authorizing Provider  ascorbic acid (VITAMIN C) 500 MG tablet Take 500 mg by mouth daily.    [provider]  cyclobenzaprine (FLEXERIL) 5 MG tablet TAKE 1 TABLET BY MOUTH THREE TIMES A DAY AS NEEDED FOR MUSCLE SPASM 03/05/23   Josph Macho, MD  fentaNYL (DURAGESIC) 25 MCG/HR Place 1 patch onto the skin every other day. 04/19/23   Josph Macho, MD  fentaNYL (DURAGESIC) 50 MCG/HR Place 1 patch onto the skin every other day. 03/18/23   Pickenpack-Cousar, Arty Baumgartner, NP  HYDROmorphone (DILAUDID) 4 MG tablet Take 1 tablet (4 mg total) by mouth every 4 (four) hours as needed for severe pain.  03/25/23   Josph Macho, MD  LORazepam (ATIVAN) 1 MG tablet Take 1 tablet (1 mg total) by mouth at bedtime as needed for anxiety or sleep. 04/16/23   Josph Macho, MD  megestrol (MEGACE ES) 625 MG/5ML suspension TAKE 5 MLS (625 MG TOTAL) BY MOUTH 2 (TWO) TIMES DAILY. DISCARD ANY REMAINDER Patient not taking: Reported on 03/25/2023 03/14/23   Josph Macho, MD  meloxicam (MOBIC) 15 MG tablet Take 1 tablet (15 mg total) by mouth daily. 03/23/23   Pickenpack-Cousar, Arty Baumgartner, NP  Multiple Vitamins-Minerals (ONE-A-DAY WOMENS 50 PLUS) TABS Take 1 tablet by mouth daily.    [provider]  ondansetron (ZOFRAN) 8 MG tablet Take 1 tablet (8 mg total) by mouth every 8 (eight) hours as needed for nausea or vomiting. 01/22/23   Josph Macho, MD  polyethylene glycol (MIRALAX) 17 g packet Take 17 g by mouth daily as needed. Patient not taking: Reported on 03/25/2023 01/08/23   Briant Cedar, MD  prochlorperazine (COMPAZINE) 10 MG tablet Take 1 tablet (10 mg total) by mouth every 6 (six) hours as needed for nausea or vomiting. 04/06/23   Josph Macho, MD      Allergies    Patient has no known allergies.    Review of Systems   Review of Systems  Musculoskeletal:  Positive for arthralgias and back pain.  All  other systems reviewed and are negative.   Physical Exam Updated Vital Signs There were no vitals taken for this visit. Physical Exam Vitals and nursing note reviewed.  Constitutional:      General: She is not in acute distress.    Appearance: Normal appearance. She is well-developed. She is not toxic-appearing or diaphoretic.  HENT:     Head: Normocephalic and atraumatic.     Right Ear: External ear normal.     Left Ear: External ear normal.     Nose: Nose normal.     Mouth/Throat:     Mouth: Mucous membranes are moist.  Eyes:     Extraocular Movements: Extraocular movements intact.     Conjunctiva/sclera: Conjunctivae normal.  Cardiovascular:     Rate and Rhythm:  Normal rate and regular rhythm.  Pulmonary:     Effort: Pulmonary effort is normal. No respiratory distress.  Abdominal:     General: There is no distension.     Palpations: Abdomen is soft.     Tenderness: There is no abdominal tenderness.  Musculoskeletal:        General: No swelling or deformity. Normal range of motion.     Cervical back: Normal range of motion and neck supple.  Skin:    General: Skin is warm and dry.  Neurological:     General: No focal deficit present.     Mental Status: She is alert and oriented to person, place, and time.     Sensory: No sensory deficit.     Motor: No weakness.  Psychiatric:        Mood and Affect: Mood normal.        Behavior: Behavior normal.        Thought Content: Thought content normal.        Judgment: Judgment normal.     ED Results / Procedures / Treatments   Labs (all labs ordered are listed, but only abnormal results are displayed) Labs Reviewed - No data to display  EKG None  Radiology No results found.  Procedures Procedures  {Document cardiac monitor, telemetry assessment procedure when appropriate:1}  Medications Ordered in ED Medications - No data to display  ED Course/ Medical Decision Making/ A&P   {   Click here for ABCD2, HEART and other calculatorsREFRESH Note before signing :1}                          Medical Decision Making Amount and/or Complexity of Data Reviewed Labs: ordered.  Risk OTC drugs. Prescription drug management.   This patient presents to the ED for concern of ***, this involves an extensive number of treatment options, and is a complaint that carries with it a high risk of complications and morbidity.  The differential diagnosis includes ***   Co morbidities that complicate the patient evaluation  ***   Additional history obtained:  Additional history obtained from *** External records from outside source obtained and reviewed including ***   Lab Tests:  I Ordered,  and personally interpreted labs.  The pertinent results include:  ***   Imaging Studies ordered:  I ordered imaging studies including ***  I independently visualized and interpreted imaging which showed *** I agree with the radiologist interpretation   Cardiac Monitoring: / EKG:  The patient was maintained on a cardiac monitor.  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  Pain has been refractory to home pain regimen.  On arrival in the ED, patient is tachycardic and hypertensive.  This is likely secondary to pain response.  On exam, she has no neurologic deficits.  IV fluids and multimodal pain control was ordered.  Laboratory workup was initiated.  On reassessment, pain is now 5/10 in severity.  Additional Dilaudid was ordered. I ordered medication including ***  for ***  Reevaluation of the patient after these medicines showed that the patient {resolved/improved/worsened:23923::"improved"} I have reviewed the patients home medicines and have made adjustments as needed   Social Determinants of Health:  ***   Test / Admission - Considered:  ***   {Document critical care time when appropriate:1} {Document review of labs and clinical decision tools ie heart score, Chads2Vasc2 etc:1}  {Document your independent review of radiology images, and any outside records:1} {Document your discussion with family members, caretakers, and with consultants:1} {Document social determinants of health affecting pt's care:1} {Document your decision making why or why not admission,  treatments were needed:1} Final Clinical Impression(s) / ED Diagnoses Final diagnoses:  None    Rx / DC Orders ED Discharge Orders     None

## 2023-04-26 NOTE — ED Provider Notes (Signed)
[place] EMERGENCY DEPARTMENT AT [facility] Provider Note   CSN: [id number] Arrival date & time: 04/26/23  0730     History  Chief Complaint  Patient presents with   Back Pain    Annette James is a 62 y.o. female.  HPI Patient presents for back pain and arthralgias.  Medical history includes metastatic breast cancer, anemia, HTN.  Her oncologist is Dr. Myna Hidalgo.  She is currently on chemotherapy.  Current home pain regimen is as follows: Every 48 hour fentanyl patches, daily Mobic, and as needed hydromorphone.  Hydromorphone was added in place of Percocet a month ago.  She feels like her pain has been controlled up until today.  Yesterday, she had a good day.  Overnight, she developed severe pain in areas of mid back, posterior shoulders, and left-sided joints.  She feels that this is the typical areas of pain just worse in severity.  She took 2 of the 4 mg oral Dilaudid's this morning, 1 at 2 AM, and 1 at 6 AM.  She continues to have severe pain.  Currently, she has a 50 mcg and a 25 mcg patch of fentanyl which were placed 2 days ago and will be due for change today.  Other than her severe pain, patient denies any other recent symptoms.    Home Medications Prior to Admission medications   Medication Sig Start Date End Date Taking? Authorizing Provider  ascorbic acid (VITAMIN C) 500 MG tablet Take 500 mg by mouth daily.   Yes [provider]  cyclobenzaprine (FLEXERIL) 5 MG tablet TAKE 1 TABLET BY MOUTH THREE TIMES A DAY AS NEEDED FOR MUSCLE SPASM Patient taking differently: Take 5 mg by mouth 3 (three) times daily as needed for muscle spasms. 03/05/23  Yes Josph Macho, MD  fentaNYL (DURAGESIC) 25 MCG/HR Place 1 patch onto the skin every other day. 04/19/23  Yes Josph Macho, MD  fentaNYL (DURAGESIC) 50 MCG/HR Place 1 patch onto the skin every other day. 03/18/23  Yes Pickenpack-Cousar, Arty Baumgartner, NP  HYDROmorphone (DILAUDID) 4 MG tablet Take 1 tablet (4 mg  total) by mouth every 4 (four) hours as needed for severe pain. 03/25/23  Yes Ennever, Rose Phi, MD  LORazepam (ATIVAN) 1 MG tablet Take 1 tablet (1 mg total) by mouth at bedtime as needed for anxiety or sleep. 04/16/23  Yes Josph Macho, MD  Multiple Vitamins-Minerals (ONE-A-DAY WOMENS 50 PLUS) TABS Take 1 tablet by mouth daily.   Yes [provider]  ondansetron (ZOFRAN) 8 MG tablet Take 1 tablet (8 mg total) by mouth every 8 (eight) hours as needed for nausea or vomiting. 01/22/23  Yes Ennever, Rose Phi, MD  prochlorperazine (COMPAZINE) 10 MG tablet Take 1 tablet (10 mg total) by mouth every 6 (six) hours as needed for nausea or vomiting. 04/06/23  Yes Ennever, Rose Phi, MD  megestrol (MEGACE ES) 625 MG/5ML suspension TAKE 5 MLS (625 MG TOTAL) BY MOUTH 2 (TWO) TIMES DAILY. DISCARD ANY REMAINDER Patient not taking: Reported on 03/25/2023 03/14/23   Josph Macho, MD  meloxicam (MOBIC) 15 MG tablet Take 1 tablet (15 mg total) by mouth daily. Patient not taking: Reported on 04/26/2023 03/23/23   Pickenpack-Cousar, Arty Baumgartner, NP  polyethylene glycol (MIRALAX) 17 g packet Take 17 g by mouth daily as needed. Patient not taking: Reported on 03/25/2023 01/08/23   Briant Cedar, MD      Allergies    Patient has no known allergies.  Review of Systems   Review of Systems  Musculoskeletal:  Positive for arthralgias and back pain.  All other systems reviewed and are negative.   Physical Exam Updated Vital Signs BP 134/85   Pulse (!) 108   Temp 98.1 F (36.7 C) (Oral)   Resp 16   Ht 5\' 8"  (1.727 m)   Wt 72.6 kg   SpO2 99%   BMI 24.33 kg/m  Physical Exam Vitals and nursing note reviewed.  Constitutional:      General: She is not in acute distress.    Appearance: Normal appearance. She is well-developed. She is not toxic-appearing or diaphoretic.  HENT:     Head: Normocephalic and atraumatic.     Right Ear: External ear normal.     Left Ear: External ear normal.     Nose: Nose  normal.     Mouth/Throat:     Mouth: Mucous membranes are moist.  Eyes:     Extraocular Movements: Extraocular movements intact.     Conjunctiva/sclera: Conjunctivae normal.  Cardiovascular:     Rate and Rhythm: Normal rate and regular rhythm.  Pulmonary:     Effort: Pulmonary effort is normal. No respiratory distress.  Abdominal:     General: There is no distension.     Palpations: Abdomen is soft.     Tenderness: There is no abdominal tenderness.  Musculoskeletal:        General: No swelling or deformity. Normal range of motion.     Cervical back: Normal range of motion and neck supple.  Skin:    General: Skin is warm and dry.  Neurological:     General: No focal deficit present.     Mental Status: She is alert and oriented to person, place, and time.     Sensory: No sensory deficit.     Motor: No weakness.  Psychiatric:        Mood and Affect: Mood normal.        Behavior: Behavior normal.        Thought Content: Thought content normal.        Judgment: Judgment normal.     ED Results / Procedures / Treatments   Labs (all labs ordered are listed, but only abnormal results are displayed) Labs Reviewed  COMPREHENSIVE METABOLIC PANEL - Abnormal; Notable for the following components:      Result Value   Potassium 3.3 (*)    Glucose, Bld 107 (*)    Calcium 8.5 (*)    Total Protein 5.4 (*)    Albumin 2.8 (*)    AST 58 (*)    All other components within normal limits  CBC WITH DIFFERENTIAL/PLATELET - Abnormal; Notable for the following components:   RBC 2.98 (*)    Hemoglobin 9.4 (*)    HCT 29.1 (*)    RDW 19.5 (*)    nRBC 0.4 (*)    Lymphs Abs 0.6 (*)    Abs Immature Granulocytes 0.15 (*)    All other components within normal limits  MAGNESIUM - Abnormal; Notable for the following components:   Magnesium 1.6 (*)    All other components within normal limits  LIPASE, BLOOD  URINALYSIS, ROUTINE W REFLEX MICROSCOPIC  HIV ANTIBODY (ROUTINE TESTING W REFLEX)     EKG EKG Interpretation Date/Time:  Monday April 26 2023 07:57:02 EDT Ventricular Rate:  114 PR Interval:  137 QRS Duration:  87 QT Interval:  315 QTC Calculation: 434 R Axis:   99  Text Interpretation: Sinus  tachycardia Anterior infarct, old Confirmed by Gloris Manchester [phone]) on 04/26/2023 10:01:28 AM  Radiology DG Knee 1-2 Views Left  Result Date: 04/26/2023 CLINICAL DATA:  [id number] Knee pain 125018 EXAM: LEFT KNEE - 1-2 VIEW; RIGHT KNEE - 1-2 VIEW COMPARISON:  None Available. FINDINGS: Extensive sclerotic metastases seen. No acute fracture or dislocation. The knee joints appear within normal limits. No significant arthritis. No knee effusion or focal soft tissue swelling. No radiopaque foreign bodies. IMPRESSION: *Extensive sclerotic metastases. No acute fracture or dislocation. Electronically Signed   By: Jules Schick M.D.   On: 04/26/2023 14:00   DG Knee 1-2 Views Right  Result Date: 04/26/2023 CLINICAL DATA:  [id number] Knee pain 125018 EXAM: LEFT KNEE - 1-2 VIEW; RIGHT KNEE - 1-2 VIEW COMPARISON:  None Available. FINDINGS: Extensive sclerotic metastases seen. No acute fracture or dislocation. The knee joints appear within normal limits. No significant arthritis. No knee effusion or focal soft tissue swelling. No radiopaque foreign bodies. IMPRESSION: *Extensive sclerotic metastases. No acute fracture or dislocation. Electronically Signed   By: Jules Schick M.D.   On: 04/26/2023 14:00   CT Angio Chest PE W and/or Wo Contrast  Result Date: 04/26/2023 CLINICAL DATA:  Pulmonary embolism (PE) suspected, high prob EXAM: CT ANGIOGRAPHY CHEST WITH CONTRAST TECHNIQUE: Multidetector CT imaging of the chest was performed using the standard protocol during bolus administration of intravenous contrast. Multiplanar CT image reconstructions and MIPs were obtained to evaluate the vascular anatomy. RADIATION DOSE REDUCTION: This exam was performed according to the departmental dose-optimization program  which includes automated exposure control, adjustment of the mA and/or kV according to patient size and/or use of iterative reconstruction technique. CONTRAST:  75mL OMNIPAQUE IOHEXOL 350 MG/ML SOLN COMPARISON:  CT scan chest from 03/12/2023. FINDINGS: Cardiovascular: No evidence of embolism to the proximal subsegmental pulmonary artery level. Normal cardiac size. No pericardial effusion. No aortic aneurysm. Mediastinum/Nodes: Visualized thyroid gland appears grossly unremarkable. No solid / cystic mediastinal masses. Small amount of fluid noted in the periaortic recess. The esophagus is nondistended precluding optimal assessment. No axillary, mediastinal or hilar lymphadenopathy by size criteria. Lungs/Pleura: The central tracheo-bronchial tree is patent. There are dependent changes in bilateral lungs. No mass or consolidation. No pleural effusion or pneumothorax. No suspicious lung nodules. Upper Abdomen: Redemonstration of an oval approximately 1.9 x 2.2 cm lesion in the right hepatic lobe, incompletely characterized on the current examination but grossly similar to the prior study. There are additional at least 2, subcapsular hypoattenuating lesions in the right lobe (marked with electronic arrow sign on series 5), which are also indeterminate but similar to the prior study. Visualized remaining upper abdominal viscera within normal limits. Musculoskeletal: A CT Port-a-Cath is seen in the right upper chest wall with the catheter terminating in the cavo-atrial junction region. Visualized soft tissues of the chest wall are otherwise grossly unremarkable. Redemonstration of diffuse sclerotic metastases. No pathological fracture. There are mild to moderate multilevel degenerative changes in the visualized spine. Redemonstration of mild-to-moderate anterior wedging deformity of T8 vertebral body, similar to the prior study. Review of the MIP images confirms the above findings. IMPRESSION: 1. No evidence of pulmonary  embolism. No acute intrathoracic pathology identified. 2. Redemonstration of diffuse sclerotic metastases. No pathological fracture. 3. Redemonstration of indeterminate hepatic lesions, grossly similar to the prior study. These could be better assessed with dedicated hepatic protocol MRI examination. Electronically Signed   By: Jules Schick M.D.   On: 04/26/2023 12:24    Procedures Procedures    Medications  Ordered in ED Medications  HYDROmorphone (DILAUDID) tablet 4 mg (has no administration in time range)  LORazepam (ATIVAN) tablet 1 mg (has no administration in time range)  prochlorperazine (COMPAZINE) tablet 10 mg (has no administration in time range)  ondansetron (ZOFRAN) tablet 8 mg (has no administration in time range)  polyethylene glycol (MIRALAX / GLYCOLAX) packet 17 g (has no administration in time range)  enoxaparin (LOVENOX) injection 40 mg (has no administration in time range)  fentaNYL (DURAGESIC) 75 MCG/HR 1 patch (1 patch Transdermal Patch Applied 04/26/23 1457)  dexamethasone (DECADRON) injection 10 mg (has no administration in time range)  dexamethasone (DECADRON) tablet 4 mg (has no administration in time range)  senna (SENOKOT) tablet 8.6 mg (has no administration in time range)  naloxone (NARCAN) injection 0.4 mg (has no administration in time range)    And  sodium chloride flush (NS) 0.9 % injection 9 mL (has no administration in time range)  ondansetron (ZOFRAN) injection 4 mg (has no administration in time range)  diphenhydrAMINE (BENADRYL) injection 12.5 mg (has no administration in time range)    Or  diphenhydrAMINE (BENADRYL) 12.5 MG/5ML elixir 12.5 mg (has no administration in time range)  fentaNYL 50 mcg/mL PCA injection (has no administration in time range)  acetaminophen (TYLENOL) tablet 1,000 mg (has no administration in time range)  HYDROmorphone (DILAUDID) injection 3 mg (has no administration in time range)  ketorolac (TORADOL) 15 MG/ML injection 30 mg  (has no administration in time range)  cyclobenzaprine (FLEXERIL) tablet 10 mg (has no administration in time range)  zoledronic acid (ZOMETA) 4 mg in sodium chloride 0.9 % 100 mL IVPB (has no administration in time range)  HYDROmorphone (DILAUDID) injection 1 mg (1 mg Intravenous Given 04/26/23 0816)  acetaminophen (TYLENOL) tablet 650 mg (650 mg Oral Given 04/26/23 0816)  ketorolac (TORADOL) 15 MG/ML injection 15 mg (15 mg Intravenous Given 04/26/23 0815)  lactated ringers bolus 1,000 mL (0 mLs Intravenous Stopped 04/26/23 0930)  HYDROmorphone (DILAUDID) injection 1 mg (1 mg Intravenous Given 04/26/23 0946)  potassium chloride SA (KLOR-CON M) CR tablet 40 mEq (40 mEq Oral Given 04/26/23 1103)  magnesium sulfate IVPB 2 g 50 mL (0 g Intravenous Stopped 04/26/23 1302)  iohexol (OMNIPAQUE) 350 MG/ML injection 75 mL (75 mLs Intravenous Contrast Given 04/26/23 1157)  HYDROmorphone (DILAUDID) injection 1 mg (1 mg Intravenous Given 04/26/23 1257)    ED Course/ Medical Decision Making/ A&P                             Medical Decision Making Amount and/or Complexity of Data Reviewed Labs: ordered. Radiology: ordered.  Risk OTC drugs. Prescription drug management. Decision regarding hospitalization.   This patient presents to the ED for concern of back and joint pain, this involves an extensive number of treatment options, and is a complaint that carries with it a high risk of complications and morbidity.  The differential diagnosis includes worsening metastatic bone disease, PE, pneumonia, pleural effusion, dissection   Co morbidities that complicate the patient evaluation  metastatic breast cancer, anemia, HTN   Additional history obtained:  Additional history obtained from patient's daughter External records from outside source obtained and reviewed including EMR   Lab Tests:  I Ordered, and personally interpreted labs.  The pertinent results include: Baseline anemia, no leukocytosis,  mild hypokalemia and hypomagnesemia   Imaging Studies ordered:  I ordered imaging studies including bilateral knee x-rays, CTA chest I independently visualized and interpreted  imaging which showed redemonstration of known metastatic bone disease without any acute findings I agree with the radiologist interpretation   Cardiac Monitoring: / EKG:  The patient was maintained on a cardiac monitor.  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  Pain has been refractory to home pain regimen.  On arrival in the ED, patient is tachycardic and hypertensive.  This is likely secondary to pain response.  On exam, she has no neurologic deficits.  IV fluids and multimodal pain control was ordered.  Laboratory workup was initiated.  On reassessment, pain is 5/10 in severity.  Additional Dilaudid was ordered.  Lab work is notable for hypokalemia and hypomagnesemia.  Replacement electrolytes were ordered.  On further reassessment, patient's pain is controlled.  Despite IV fluids and pain control, she remains tachycardic.  She is not currently on a blood thinner.  Will check for PE.  CTA of chest showed redemonstration of known sclerotic metastatic disease.  There was no evidence of PE.  Patient required continued IV doses of pain medication in the ED.  She was admitted to medicine for ongoing pain control. I ordered medication including Dilaudid, Tylenol, Toradol for analgesia; potassium chloride and magnesium sulfate for electrolyte optimization; IV fluids for hydration Reevaluation of the patient after these medicines showed that the patient improved I have reviewed the patients home medicines and have made adjustments as needed   Social  Determinants of Health:  Has access to outpatient care        Final Clinical Impression(s) / ED Diagnoses Final diagnoses:  Cancer-related breakthrough pain  Intractable pain    Rx / DC Orders ED Discharge Orders     None         Gloris Manchester, MD 04/26/23 1653

## 2023-04-27 ENCOUNTER — Encounter: Payer: Self-pay | Admitting: *Deleted

## 2023-04-27 DIAGNOSIS — C50919 Malignant neoplasm of unspecified site of unspecified female breast: Secondary | ICD-10-CM | POA: Diagnosis present

## 2023-04-27 DIAGNOSIS — Z79899 Other long term (current) drug therapy: Secondary | ICD-10-CM | POA: Diagnosis not present

## 2023-04-27 DIAGNOSIS — E876 Hypokalemia: Secondary | ICD-10-CM | POA: Diagnosis present

## 2023-04-27 DIAGNOSIS — G893 Neoplasm related pain (acute) (chronic): Secondary | ICD-10-CM | POA: Diagnosis present

## 2023-04-27 DIAGNOSIS — C7951 Secondary malignant neoplasm of bone: Secondary | ICD-10-CM | POA: Diagnosis present

## 2023-04-27 DIAGNOSIS — I10 Essential (primary) hypertension: Secondary | ICD-10-CM | POA: Diagnosis present

## 2023-04-27 DIAGNOSIS — Z803 Family history of malignant neoplasm of breast: Secondary | ICD-10-CM | POA: Diagnosis not present

## 2023-04-27 DIAGNOSIS — Z515 Encounter for palliative care: Secondary | ICD-10-CM | POA: Diagnosis not present

## 2023-04-27 DIAGNOSIS — D638 Anemia in other chronic diseases classified elsewhere: Secondary | ICD-10-CM | POA: Diagnosis present

## 2023-04-27 DIAGNOSIS — Z9221 Personal history of antineoplastic chemotherapy: Secondary | ICD-10-CM | POA: Diagnosis not present

## 2023-04-27 DIAGNOSIS — E785 Hyperlipidemia, unspecified: Secondary | ICD-10-CM | POA: Diagnosis present

## 2023-04-27 DIAGNOSIS — Z811 Family history of alcohol abuse and dependence: Secondary | ICD-10-CM | POA: Diagnosis not present

## 2023-04-27 DIAGNOSIS — R5381 Other malaise: Secondary | ICD-10-CM | POA: Diagnosis present

## 2023-04-27 DIAGNOSIS — R52 Pain, unspecified: Secondary | ICD-10-CM | POA: Diagnosis present

## 2023-04-27 DIAGNOSIS — C787 Secondary malignant neoplasm of liver and intrahepatic bile duct: Secondary | ICD-10-CM | POA: Diagnosis present

## 2023-04-27 DIAGNOSIS — C7952 Secondary malignant neoplasm of bone marrow: Secondary | ICD-10-CM | POA: Diagnosis present

## 2023-04-27 DIAGNOSIS — Z800 Family history of malignant neoplasm of digestive organs: Secondary | ICD-10-CM | POA: Diagnosis not present

## 2023-04-27 DIAGNOSIS — T40415A Adverse effect of fentanyl or fentanyl analogs, initial encounter: Secondary | ICD-10-CM | POA: Diagnosis present

## 2023-04-27 DIAGNOSIS — Z923 Personal history of irradiation: Secondary | ICD-10-CM | POA: Diagnosis not present

## 2023-04-27 DIAGNOSIS — Z8249 Family history of ischemic heart disease and other diseases of the circulatory system: Secondary | ICD-10-CM | POA: Diagnosis not present

## 2023-04-27 DIAGNOSIS — K5903 Drug induced constipation: Secondary | ICD-10-CM | POA: Diagnosis present

## 2023-04-27 DIAGNOSIS — Z79891 Long term (current) use of opiate analgesic: Secondary | ICD-10-CM | POA: Diagnosis not present

## 2023-04-27 LAB — CBC WITH DIFFERENTIAL/PLATELET
Abs Immature Granulocytes: 0 10*3/uL (ref 0.00–0.07)
Basophils Absolute: 0 10*3/uL (ref 0.0–0.1)
Basophils Relative: 0 %
Eosinophils Absolute: 0 10*3/uL (ref 0.0–0.5)
Eosinophils Relative: 0 %
HCT: 29.9 % — ABNORMAL LOW (ref 36.0–46.0)
Hemoglobin: 9.6 g/dL — ABNORMAL LOW (ref 12.0–15.0)
Lymphocytes Relative: 7 %
Lymphs Abs: 0.5 10*3/uL — ABNORMAL LOW (ref 0.7–4.0)
MCH: 31.3 pg (ref 26.0–34.0)
MCHC: 32.1 g/dL (ref 30.0–36.0)
MCV: 97.4 fL (ref 80.0–100.0)
Monocytes Absolute: 0.1 10*3/uL (ref 0.1–1.0)
Monocytes Relative: 2 %
Neutro Abs: 5.9 10*3/uL (ref 1.7–7.7)
Neutrophils Relative %: 91 %
Platelets: 264 10*3/uL (ref 150–400)
RBC: 3.07 MIL/uL — ABNORMAL LOW (ref 3.87–5.11)
RDW: 19.3 % — ABNORMAL HIGH (ref 11.5–15.5)
WBC: 6.5 10*3/uL (ref 4.0–10.5)
nRBC: 0 % (ref 0.0–0.2)
nRBC: 0 /100 WBC

## 2023-04-27 LAB — COMPREHENSIVE METABOLIC PANEL
ALT: 17 U/L (ref 0–44)
AST: 95 U/L — ABNORMAL HIGH (ref 15–41)
Albumin: 2.8 g/dL — ABNORMAL LOW (ref 3.5–5.0)
Alkaline Phosphatase: 128 U/L — ABNORMAL HIGH (ref 38–126)
Anion gap: 11 (ref 5–15)
BUN: 12 mg/dL (ref 6–20)
CO2: 23 mmol/L (ref 22–32)
Calcium: 9 mg/dL (ref 8.9–10.3)
Chloride: 103 mmol/L (ref 98–111)
Creatinine, Ser: 0.63 mg/dL (ref 0.44–1.00)
GFR, Estimated: >60 mL/min (ref >60)
Glucose, Bld: 156 mg/dL — ABNORMAL HIGH (ref 70–99)
Potassium: 4.5 mmol/L (ref 3.5–5.1)
Sodium: 137 mmol/L (ref 135–145)
Total Bilirubin: 0.5 mg/dL (ref 0.3–1.2)
Total Protein: 5.3 g/dL — ABNORMAL LOW (ref 6.5–8.1)

## 2023-04-27 LAB — HIV ANTIBODY (ROUTINE TESTING W REFLEX): HIV Screen 4th Generation wRfx: NONREACTIVE

## 2023-04-27 LAB — URIC ACID: Uric Acid, Serum: 3.5 mg/dL (ref 2.5–7.1)

## 2023-04-27 LAB — MAGNESIUM: Magnesium: 2 mg/dL (ref 1.7–2.4)

## 2023-04-27 MED ORDER — HYDROMORPHONE HCL 1 MG/ML IJ SOLN
2.0000 mg | INTRAMUSCULAR | Status: DC | PRN
  Administered 2023-04-27 – 2023-04-28 (×3): 2 mg via INTRAVENOUS
  Filled 2023-04-27 (×3): qty 2

## 2023-04-27 MED ORDER — CHLORHEXIDINE GLUCONATE CLOTH 2 % EX PADS
6.0000 | MEDICATED_PAD | Freq: Every day | CUTANEOUS | Status: DC
  Administered 2023-04-27 – 2023-04-28 (×2): 6 via TOPICAL

## 2023-04-27 MED ORDER — GABAPENTIN 100 MG PO CAPS
100.0000 mg | ORAL_CAPSULE | Freq: Three times a day (TID) | ORAL | Status: DC | PRN
  Administered 2023-04-27 – 2023-04-28 (×2): 100 mg via ORAL
  Filled 2023-04-27 (×2): qty 1

## 2023-04-27 MED ORDER — HYDROMORPHONE HCL 2 MG PO TABS: 4.0000 mg | ORAL_TABLET | ORAL | Status: DC | PRN

## 2023-04-27 MED ORDER — ONDANSETRON HCL 4 MG/2ML IJ SOLN: 4.0000 mg | Freq: Four times a day (QID) | INTRAMUSCULAR | Status: DC | PRN

## 2023-04-27 MED ORDER — HYDROMORPHONE HCL 2 MG PO TABS: 6.0000 mg | ORAL_TABLET | ORAL | Status: DC | PRN

## 2023-04-27 MED ORDER — CYCLOBENZAPRINE HCL 10 MG PO TABS
10.0000 mg | ORAL_TABLET | Freq: Three times a day (TID) | ORAL | Status: DC
  Administered 2023-04-27 – 2023-04-28 (×4): 10 mg via ORAL
  Filled 2023-04-27 (×4): qty 1

## 2023-04-27 MED ORDER — FENTANYL 100 MCG/HR TD PT72
1.0000 | MEDICATED_PATCH | TRANSDERMAL | Status: DC
  Administered 2023-04-27: 1 via TRANSDERMAL
  Filled 2023-04-27: qty 1

## 2023-04-27 MED ORDER — KETOROLAC TROMETHAMINE 30 MG/ML IJ SOLN
30.0000 mg | Freq: Four times a day (QID) | INTRAMUSCULAR | Status: DC
  Administered 2023-04-27 – 2023-04-28 (×3): 30 mg via INTRAVENOUS
  Filled 2023-04-27 (×3): qty 1

## 2023-04-27 NOTE — Progress Notes (Signed)
Thankfully, Annette James has better pain control.  Unfortunately, the PCA is such an aggravation for her.  The fact that the pulse ox machine goes off all the time is so tiring for her.  She was not able to sleep at all last night.  She would prefer to have the PCA taken off.  She has not been able to get the Toradol because she has been on the PCA.  Again, the pain is doing much better.  She still has little bit of pain in the upper to mid back.  Her labs show sodium 137.  Potassium 4.5.  BUN 12 creatinine 0.63.  Calcium is 9 with an albumin of 2.8.  Her uric acid is only 3.5.  White cell count 6.5.  Hemoglobin 9.6.  Platelet count 264,000.  She is going to the bathroom.  She is having no problems with nausea or vomiting.  She is having no cough or shortness of breath.  Her vital signs show temperature of 98.2.  Pulse 91.  Blood pressure 136/73.  Her head and neck exam shows no ocular or oral lesions.  There are no palpable cervical or supraclavicular lymph nodes.  Lungs are clear bilaterally.  Cardiac exam regular rate and rhythm.  Abdomen is soft.  Bowel sounds are present.  There is no guarding or rebound tenderness.  There is no palpable liver or spleen tip.  Extremities shows no swelling in the knees.  She has good range of motion of her joints.  Neurological exam is nonfocal.   Annette James has a metastatic breast cancer.  She has bone metastasis.  She has been on chemotherapy.  She has had only 1 cycle of second line therapy with Abraxane.  She is having her knees no longer hurt.  I am happy about this.  Have to believe that a lot of this pain is inflammatory.  As such, we really need to get something that is a good anti-inflammatory for her.  Hopefully, the Toradol will continue to help.  For right now, I would keep her on the IV Dilaudid.  This does seem to help with any flareups that she has.  She gets the Toradol on schedule.  I would like to believe that she will continue to improve  and that we should be able to hopefully get her home in a couple of days.  I know that she we will get incredible care from everybody up [address]  [name], MD  Romans 10:13

## 2023-04-27 NOTE — Progress Notes (Signed)
Daily Progress Note   Patient Name: Annette James       Date: 04/27/2023 DOB: [birth year]  Age: 62 y.o. MRN#: [id number] Attending Physician: Rolly Salter, MD Primary Care Physician: Etta Grandchild, MD Admit Date: 04/26/2023  Reason for Consultation/Follow-up: Pain control  Patient Profile/HPI:  62 y.o. 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 Complains of back pain and knee pain. CT Angio chest negative for PE- demonstrated known liver metastasis and diffuse sclerotic metastasis, multilevel degenerative changes in the spine. Xrays of bilateral knees show diffuse boney metastasis. Palliative consulted for assistance with pain control.    Subjective: Chart reviewed including labs, progress notes, imaging from this and previous encounters.  December is feeling much better today.  PCA has been d/c'd. We discussed adjusting her fentanyl patch based on her last 24 hr opioid requirements for pain control. Will also adjust her breakthrough medication accordingly to reflect 10% of OME of daily fentanyl dose. She is in agreement with this plan.  Review of Systems  Constitutional:  Positive for malaise/fatigue.     Physical Exam Vitals and nursing note reviewed.  Constitutional:      General: She is not in acute distress. Cardiovascular:     Rate and Rhythm: Normal rate.  Pulmonary:     Effort: Pulmonary effort is normal.  Neurological:     Mental Status: She is alert and oriented to person, place, and time.             Vital Signs: BP 134/84 (BP Location: Right Arm)   Pulse 96   Temp 98.4 F (36.9 C) (Oral)   Resp 18   Ht 5\' 8"  (1.727 m)   Wt 72.6 kg   SpO2 97%   BMI  24.33 kg/m  SpO2: SpO2: 97 % O2 Device: O2 Device: Room Air O2 Flow Rate: O2 Flow Rate (L/min): 0 L/min  Intake/output summary:  Intake/Output Summary (Last 24 hours) at 04/27/2023 1338 Last data filed at 04/26/2023 2023 Gross per 24 hour  Intake 240 ml  Output --  Net 240 ml   LBM: Last BM Date : (P) 04/26/23 Baseline Weight: Weight: 72.6 kg Most recent weight: Weight: 72.6 kg  Palliative Assessment/Data: PPS: 70%      Patient Active Problem List   Diagnosis Date Noted   Intractable pain 04/26/2023   Cancer-related breakthrough pain 01/03/2023   Acute bronchitis due to COVID-19 virus 04/28/2021   Clostridium difficile colitis    Acute colitis 12/16/2020   Hypokalemia 12/16/2020   Leukocytopenia 12/16/2020   Class 1 obesity 12/16/2020   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 deficiency 11/02/2020   Breast cancer metastasized to bone, unspecified laterality (HCC) 11/01/2020   Deficiency anemia 10/30/2020   Primary hypertension 10/30/2020   Encounter for general adult medical examination with abnormal findings 10/30/2020   Iron deficiency anemia due to chronic blood loss 10/30/2020   Breast mass, right 10/19/2020   Goals of care, counseling/discussion 10/19/2020   Labyrinthitis 10/18/2020   Lytic bone lesions on xray 10/18/2020    Palliative Care Assessment & Plan    Assessment/Recommendations/Plan  Based on 24 hour OME will increase Fentanyl to 129mcg/hr TD q48 hrs Increase po hydromorphone to 6mg  po q4hr prn for breakthrough pain Continue Senna nightly for constipation prophylaxis Continue dexamethasone 4mg  po daily for anti-inflammatory effect on bone pain Continue acetaminophen 1000mg  TID po for adjuvant effect on opioid Hopeful that pain will be controlled well with this oral/TD  regimen and can be continued outpatient  Code Status: Full code  Prognosis:  Unable to determine  Discharge Planning: Home- sees Mayra Reel, NP as  outpatient  Care plan was discussed with patient and care team  Thank you for allowing the Palliative Medicine Team to assist in the care of this patient.  Greater than 50%  of this time was spent counseling and coordinating care related to the above assessment and plan.  Ocie Bob, AGNP-C Palliative Medicine   Please contact Palliative Medicine Team phone at [phone] for questions and concerns.

## 2023-04-27 NOTE — Progress Notes (Signed)
   04/27/23 0837  Assess: MEWS Score  Temp 98.6 F (37 C)  BP 139/86  MAP (mmHg) 100  Pulse Rate (!) 105  SpO2 100 %  O2 Device Room Air  Assess: MEWS Score  MEWS Temp 0  MEWS Systolic 0  MEWS Pulse 1  MEWS RR 1  MEWS LOC 0  MEWS Score 2  MEWS Score Color Yellow  Assess: if the MEWS score is Yellow or Red  Were vital signs taken at a resting state? Yes  Focused Assessment No change from prior assessment  Does the patient meet 2 or more of the SIRS criteria? No  MEWS guidelines implemented  Yes, yellow  Treat  MEWS Interventions Considered administering scheduled or prn medications/treatments as ordered  Take Vital Signs  Increase Vital Sign Frequency  Yellow: Q2hr x1, continue Q4hrs until patient remains green for 12hrs  Escalate  MEWS: Escalate Yellow: Discuss with charge nurse and consider notifying provider and/or RRT  Notify: Charge Nurse/RN  Name of Charge Nurse/RN Notified Miriam, RN  Provider Notification  Provider Name/Title Dr. Lynden Oxford  Date Provider Notified 04/27/23  Time Provider Notified 0930  Method of Notification Rounds  Notification Reason Other (Comment) (Yellow MEWs)  Provider response No new orders  Date of Provider Response 04/27/23  Assess: SIRS CRITERIA  SIRS Temperature  0  SIRS Pulse 1  SIRS Respirations  0  SIRS WBC 0  SIRS Score Sum  1

## 2023-04-27 NOTE — Plan of Care (Signed)

## 2023-04-28 DIAGNOSIS — Z515 Encounter for palliative care: Secondary | ICD-10-CM

## 2023-04-28 DIAGNOSIS — C50919 Malignant neoplasm of unspecified site of unspecified female breast: Secondary | ICD-10-CM | POA: Diagnosis not present

## 2023-04-28 DIAGNOSIS — C7951 Secondary malignant neoplasm of bone: Secondary | ICD-10-CM | POA: Diagnosis not present

## 2023-04-28 DIAGNOSIS — G893 Neoplasm related pain (acute) (chronic): Secondary | ICD-10-CM | POA: Diagnosis not present

## 2023-04-28 LAB — CANCER ANTIGEN 27.29: CA 27.29: 112.1 U/mL — ABNORMAL HIGH (ref 0.0–38.6)

## 2023-04-28 MED ORDER — HEPARIN SOD (PORK) LOCK FLUSH 100 UNIT/ML IV SOLN
500.0000 [IU] | INTRAVENOUS | Status: AC | PRN
  Administered 2023-04-28: 500 [IU]

## 2023-04-28 MED ORDER — ACETAMINOPHEN 500 MG PO TABS: 1000.0000 mg | ORAL_TABLET | Freq: Three times a day (TID) | ORAL | 0 refills | Status: DC

## 2023-04-28 MED ORDER — SENNA 8.6 MG PO TABS: 1.0000 | ORAL_TABLET | Freq: Every day | ORAL | 0 refills | Status: DC

## 2023-04-28 MED ORDER — FENTANYL 100 MCG/HR TD PT72: 1.0000 | MEDICATED_PATCH | TRANSDERMAL | 0 refills | Status: DC

## 2023-04-28 MED ORDER — DEXAMETHASONE 4 MG PO TABS: 4.0000 mg | ORAL_TABLET | Freq: Every day | ORAL | 1 refills | Status: DC

## 2023-04-28 MED ORDER — CYCLOBENZAPRINE HCL 10 MG PO TABS
10.0000 mg | ORAL_TABLET | Freq: Three times a day (TID) | ORAL | 0 refills | Status: DC
Start: 2023-04-28 — End: 2023-05-15

## 2023-04-28 MED ORDER — GABAPENTIN 100 MG PO CAPS: 100.0000 mg | ORAL_CAPSULE | Freq: Three times a day (TID) | ORAL | 1 refills | Status: DC | PRN

## 2023-04-28 MED ORDER — HYDROMORPHONE HCL 2 MG PO TABS: 6.0000 mg | ORAL_TABLET | ORAL | 0 refills | Status: DC | PRN

## 2023-04-28 NOTE — Progress Notes (Signed)
Secure chat with Dr Ophelia Charter.  Orders to not draw labs and deaccess PAC for home.  States is to have follow up tomorrow

## 2023-04-28 NOTE — Hospital Course (Addendum)
62yo female with h/o metastatic breast CA to bones/liver on chemotherapy and chronic anemia of chronic disease presented on 7/15 with intractable pain.  She has been on fentanyl patch and PO Dilaudid at home and Duragesic was increased from 50 mcg to 75 mcg without improvement.  Palliative care is consulting and recommended Fentanyl PCA with ongoing use of Fentanyl patch, Dexamethasone, Senna, and standing Tylenol 1000 mg TID.  She is full code.  Oncology is consulting.  She is no longer on the PCA and is being treated with 100 mcg Duragesic patch, Flexeril, Decadron, PO Dilaudid with IV for breakthrough pain, and Toradol with markedly improved pain control.   Feels ready for dc today.

## 2023-04-28 NOTE — Discharge Summary (Signed)
Physician Discharge Summary   Patient: Annette James MRN: [id number] DOB: June 15, 1961  Admit date:     04/26/2023  Discharge date: 04/28/23  Discharge Physician: Jonah Blue   PCP: Etta Grandchild, MD   Recommendations at discharge:   Medications changed as discussed.  Staggering timing of medications may improve pain control. Use increased dose (100 mcg every 72 hours) of Fentanyl patch Take Flexeril 3 times daily Take gabapentin 3 times daily as needed Take Tylenol 3 times daily Follow up with oncology tomorrow as scheduled Take Decadron daily for now Take Senokot daily to decrease constipation effect of narcotics  Discharge Diagnoses: Principal Problem:   Cancer-related breakthrough pain Active Problems:   Breast cancer metastasized to bone, unspecified laterality (HCC)   Intractable pain   Cancer associated pain   Hospice care patient   Palliative care patient    Hospital Course: 62yo female with h/o metastatic breast CA to bones/liver on chemotherapy and chronic anemia of chronic disease presented on 7/15 with intractable pain.  She has been on fentanyl patch and PO Dilaudid at home and Duragesic was increased from 50 mcg to 75 mcg without improvement.  Palliative care is consulting and recommended Fentanyl PCA with ongoing use of Fentanyl patch, Dexamethasone, Senna, and standing Tylenol 1000 mg TID.  She is full code.  Oncology is consulting.  She is no longer on the PCA and is being treated with 100 mcg Duragesic patch, Flexeril, Decadron, PO Dilaudid with IV for breakthrough pain, and Toradol with markedly improved pain control.   Feels ready for dc today.  Assessment and Plan:  Cancer related pain Patient with h/o metastatic breast CA on Abraxane Presented with uncontrolled pain Medications changed with increased dose of Fentanyl transdermal, addition of Flexeril, standing APAP, dexamethasone, and gabapentin, and ongoing use of PO Dilaudid for breakthrough  pain Her pain is now controlled and she has oncology f/u scheduled for tomorrow with her second cycle of Abraxane     Pain control - [place] Controlled Substance Reporting System database was reviewed. and patient was instructed, not to drive, operate heavy machinery, perform activities at heights, swimming or participation in water activities or provide baby-sitting services while on Pain, Sleep and Anxiety Medications; until their outpatient Physician has advised to do so again. Also recommended to not to take more than prescribed Pain, Sleep and Anxiety Medications.    Consultants: Oncology, palliative care Procedures performed: None  Disposition: Home Diet recommendation:  Regular diet DISCHARGE MEDICATION: Allergies as of 04/28/2023   No Known Allergies      Medication List     STOP taking these medications    fentaNYL 25 MCG/HR Commonly known as: DURAGESIC   fentaNYL 50 MCG/HR Commonly known as: DURAGESIC Replaced by: fentaNYL 100 MCG/HR   megestrol 625 MG/5ML suspension Commonly known as: MEGACE ES   meloxicam 15 MG tablet Commonly known as: Mobic   polyethylene glycol 17 g packet Commonly known as: MiraLax       TAKE these medications    acetaminophen 500 MG tablet Commonly known as: TYLENOL Take 2 tablets (1,000 mg total) by mouth 3 (three) times daily.   ascorbic acid 500 MG tablet Commonly known as: VITAMIN C Take 500 mg by mouth daily.   cyclobenzaprine 10 MG tablet Commonly known as: FLEXERIL Take 1 tablet (10 mg total) by mouth 3 (three) times daily. What changed:  medication strength See the new instructions.   dexamethasone 4 MG tablet Commonly known as: DECADRON Take 1  tablet (4 mg total) by mouth daily. Start taking on: April 29, 2023   fentaNYL 100 MCG/HR Commonly known as: DURAGESIC Place 1 patch onto the skin every other day. Start taking on: April 29, 2023 Replaces: fentaNYL 50 MCG/HR   gabapentin 100 MG  capsule Commonly known as: NEURONTIN Take 1 capsule (100 mg total) by mouth 3 (three) times daily as needed (nerve pain).   HYDROmorphone 2 MG tablet Commonly known as: DILAUDID Take 3 tablets (6 mg total) by mouth every 4 (four) hours as needed for severe pain (breakthrough pain). What changed:  medication strength how much to take reasons to take this   LORazepam 1 MG tablet Commonly known as: ATIVAN Take 1 tablet (1 mg total) by mouth at bedtime as needed for anxiety or sleep.   ondansetron 8 MG tablet Commonly known as: Zofran Take 1 tablet (8 mg total) by mouth every 8 (eight) hours as needed for nausea or vomiting.   One-A-Day Womens 50 Plus Tabs Take 1 tablet by mouth daily.   prochlorperazine 10 MG tablet Commonly known as: COMPAZINE Take 1 tablet (10 mg total) by mouth every 6 (six) hours as needed for nausea or vomiting.   senna 8.6 MG Tabs tablet Commonly known as: SENOKOT Take 1 tablet (8.6 mg total) by mouth at bedtime.        Discharge Exam: Filed Weights   04/26/23 0740  Weight: 72.6 kg   Subjective: Reports her pain is much better and she is eager to go home today.  No new concerns.   Physical Exam:       Vitals:    04/27/23 1825 04/27/23 2101 04/28/23 0432 04/28/23 0938  BP: (!) 140/81 (!) 142/81 138/79 133/74  Pulse: (!) 104 99 (!) 103 (!) 109  Resp: 18 15 15 16   Temp: 98.4 F (36.9 C) 98.1 F (36.7 C) 98.2 F (36.8 C) 98.3 F (36.8 C)  TempSrc: Oral Oral Oral Oral  SpO2: 100% 100% 100% 100%  Weight:          Height:            General:  Appears calm and comfortable and is in NAD, alopecic Eyes:  EOMI, normal lids, iris ENT:  grossly normal hearing, lips & tongue, mmm Neck:  no LAD, masses or thyromegaly Cardiovascular:  RRR, no m/r/g. No LE edema.  Respiratory:   CTA bilaterally with no wheezes/rales/rhonchi.  Normal respiratory effort. Abdomen:  soft, NT, ND Skin:  no rash or induration seen on limited exam Musculoskeletal:   grossly normal tone BUE/BLE, good ROM, no bony abnormality Psychiatric:  grossly normal mood and affect, speech fluent and appropriate, AOx3 Neurologic:  CN 2-12 grossly intact, moves all extremities in coordinated fashion    Condition at discharge: improving  The results of significant diagnostics from this hospitalization (including imaging, microbiology, ancillary and laboratory) are listed below for reference.   Imaging Studies: DG Knee 1-2 Views Left  Result Date: 04/26/2023 CLINICAL DATA:  [id number] Knee pain 125018 EXAM: LEFT KNEE - 1-2 VIEW; RIGHT KNEE - 1-2 VIEW COMPARISON:  None Available. FINDINGS: Extensive sclerotic metastases seen. No acute fracture or dislocation. The knee joints appear within normal limits. No significant arthritis. No knee effusion or focal soft tissue swelling. No radiopaque foreign bodies. IMPRESSION: *Extensive sclerotic metastases. No acute fracture or dislocation. Electronically Signed   By: Jules Schick M.D.   On: 04/26/2023 14:00   DG Knee 1-2 Views Right  Result Date: 04/26/2023 CLINICAL DATA:  [id number] Knee pain 125018 EXAM: LEFT KNEE - 1-2 VIEW; RIGHT KNEE - 1-2 VIEW COMPARISON:  None Available. FINDINGS: Extensive sclerotic metastases seen. No acute fracture or dislocation. The knee joints appear within normal limits. No significant arthritis. No knee effusion or focal soft tissue swelling. No radiopaque foreign bodies. IMPRESSION: *Extensive sclerotic metastases. No acute fracture or dislocation. Electronically Signed   By: Jules Schick M.D.   On: 04/26/2023 14:00   CT Angio Chest PE W and/or Wo Contrast  Result Date: 04/26/2023 CLINICAL DATA:  Pulmonary embolism (PE) suspected, high prob EXAM: CT ANGIOGRAPHY CHEST WITH CONTRAST TECHNIQUE: Multidetector CT imaging of the chest was performed using the standard protocol during bolus administration of intravenous contrast. Multiplanar CT image reconstructions and MIPs were obtained to evaluate the  vascular anatomy. RADIATION DOSE REDUCTION: This exam was performed according to the departmental dose-optimization program which includes automated exposure control, adjustment of the mA and/or kV according to patient size and/or use of iterative reconstruction technique. CONTRAST:  75mL OMNIPAQUE IOHEXOL 350 MG/ML SOLN COMPARISON:  CT scan chest from 03/12/2023. FINDINGS: Cardiovascular: No evidence of embolism to the proximal subsegmental pulmonary artery level. Normal cardiac size. No pericardial effusion. No aortic aneurysm. Mediastinum/Nodes: Visualized thyroid gland appears grossly unremarkable. No solid / cystic mediastinal masses. Small amount of fluid noted in the periaortic recess. The esophagus is nondistended precluding optimal assessment. No axillary, mediastinal or hilar lymphadenopathy by size criteria. Lungs/Pleura: The central tracheo-bronchial tree is patent. There are dependent changes in bilateral lungs. No mass or consolidation. No pleural effusion or pneumothorax. No suspicious lung nodules. Upper Abdomen: Redemonstration of an oval approximately 1.9 x 2.2 cm lesion in the right hepatic lobe, incompletely characterized on the current examination but grossly similar to the prior study. There are additional at least 2, subcapsular hypoattenuating lesions in the right lobe (marked with electronic arrow sign on series 5), which are also indeterminate but similar to the prior study. Visualized remaining upper abdominal viscera within normal limits. Musculoskeletal: A CT Port-a-Cath is seen in the right upper chest wall with the catheter terminating in the cavo-atrial junction region. Visualized soft tissues of the chest wall are otherwise grossly unremarkable. Redemonstration of diffuse sclerotic metastases. No pathological fracture. There are mild to moderate multilevel degenerative changes in the visualized spine. Redemonstration of mild-to-moderate anterior wedging deformity of T8 vertebral  body, similar to the prior study. Review of the MIP images confirms the above findings. IMPRESSION: 1. No evidence of pulmonary embolism. No acute intrathoracic pathology identified. 2. Redemonstration of diffuse sclerotic metastases. No pathological fracture. 3. Redemonstration of indeterminate hepatic lesions, grossly similar to the prior study. These could be better assessed with dedicated hepatic protocol MRI examination. Electronically Signed   By: Jules Schick M.D.   On: 04/26/2023 12:24    Microbiology: Results for orders placed or performed during the hospital encounter of 12/16/20  Resp Panel by RT-PCR (Flu A&B, Covid) Nasopharyngeal Swab     Status: None   Collection Time: 12/16/20 11:30 PM   Specimen: Nasopharyngeal Swab; Nasopharyngeal(NP) swabs in vial transport medium  Result Value Ref Range Status   SARS Coronavirus 2 by RT PCR NEGATIVE NEGATIVE Final    Comment: (NOTE) SARS-CoV-2 target nucleic acids are NOT DETECTED.  The SARS-CoV-2 RNA is generally detectable in upper respiratory specimens during the acute phase of infection. The lowest concentration of SARS-CoV-2 viral copies this assay can detect is 138 copies/mL. A negative result does not preclude SARS-Cov-2 infection and should not be used as  the sole basis for treatment or other patient management decisions. A negative result may occur with  improper specimen collection/handling, submission of specimen other than nasopharyngeal swab, presence of viral mutation(s) within the areas targeted by this assay, and inadequate number of viral copies(<138 copies/mL). A negative result must be combined with clinical observations, patient history, and epidemiological information. The expected result is Negative.  Fact Sheet for Patients:  BloggerCourse.com  Fact Sheet for Healthcare Providers:  SeriousBroker.it  This test is no t yet approved or cleared by the [place]  FDA and  has been authorized for detection and/or diagnosis of SARS-CoV-2 by FDA under an Emergency Use Authorization (EUA). This EUA will remain  in effect (meaning this test can be used) for the duration of the COVID-19 declaration under Section 564(b)(1) of the Act, 21 U.S.C.section 360bbb-3(b)(1), unless the authorization is terminated  or revoked sooner.       Influenza A by PCR NEGATIVE NEGATIVE Final   Influenza B by PCR NEGATIVE NEGATIVE Final    Comment: (NOTE) The Xpert Xpress SARS-CoV-2/FLU/RSV plus assay is intended as an aid in the diagnosis of influenza from Nasopharyngeal swab specimens and should not be used as a sole basis for treatment. Nasal washings and aspirates are unacceptable for Xpert Xpress SARS-CoV-2/FLU/RSV testing.  Fact Sheet for Patients: BloggerCourse.com  Fact Sheet for Healthcare Providers: SeriousBroker.it  This test is not yet approved or cleared by the [place] FDA and has been authorized for detection and/or diagnosis of SARS-CoV-2 by FDA under an Emergency Use Authorization (EUA). This EUA will remain in effect (meaning this test can be used) for the duration of the COVID-19 declaration under Section 564(b)(1) of the Act, 21 U.S.C. section 360bbb-3(b)(1), unless the authorization is terminated or revoked.  Performed at [facility], [address] [place], [place] [address]   C Difficile Quick Screen w PCR reflex     Status: Abnormal   Collection Time: 12/17/20  3:52 PM   Specimen: STOOL  Result Value Ref Range Status   C Diff antigen POSITIVE (A) NEGATIVE Final   C Diff toxin NEGATIVE NEGATIVE Final   C Diff interpretation Results are indeterminate. See PCR results.  Final    Comment: Performed at [facility], [address], [place], [place] [address]  Gastrointestinal Panel by PCR , Stool     Status: None   Collection Time: 12/17/20  3:52  PM   Specimen: Stool  Result Value Ref Range Status   Campylobacter species NOT DETECTED NOT DETECTED Final   Plesimonas shigelloides NOT DETECTED NOT DETECTED Final   Salmonella species NOT DETECTED NOT DETECTED Final   Yersinia enterocolitica NOT DETECTED NOT DETECTED Final   Vibrio species NOT DETECTED NOT DETECTED Final   Vibrio cholerae NOT DETECTED NOT DETECTED Final   Enteroaggregative E coli (EAEC) NOT DETECTED NOT DETECTED Final   Enteropathogenic E coli (EPEC) NOT DETECTED NOT DETECTED Final   Enterotoxigenic E coli (ETEC) NOT DETECTED NOT DETECTED Final   Shiga like toxin producing E coli (STEC) NOT DETECTED NOT DETECTED Final   Shigella/Enteroinvasive E coli (EIEC) NOT DETECTED NOT DETECTED Final   Cryptosporidium NOT DETECTED NOT DETECTED Final   Cyclospora cayetanensis NOT DETECTED NOT DETECTED Final   Entamoeba histolytica NOT DETECTED NOT DETECTED Final   Giardia lamblia NOT DETECTED NOT DETECTED Final   Adenovirus F40/41 NOT DETECTED NOT DETECTED Final   Astrovirus NOT DETECTED NOT DETECTED Final   Norovirus GI/GII NOT DETECTED NOT DETECTED  Final   Rotavirus A NOT DETECTED NOT DETECTED Final   Sapovirus (I, II, IV, and V) NOT DETECTED NOT DETECTED Final    Comment: Performed at [facility], [address] [place], [place] [address]  C. Diff by PCR, Reflexed     Status: Abnormal   Collection Time: 12/17/20  3:52 PM  Result Value Ref Range Status   Toxigenic C. Difficile by PCR POSITIVE (A) NEGATIVE Final    Comment: Positive for toxigenic C. difficile with little to no toxin production. Only treat if clinical presentation suggests symptomatic illness. Performed at [facility], [address] [place], [place] [address]       Discharge time spent: greater than 30 minutes.  Signed: Jonah Blue, MD [facility] 04/28/2023

## 2023-04-28 NOTE — Progress Notes (Signed)
Daily Progress Note   Patient Name: Annette James       Date: 04/28/2023 DOB: [birth year]  Age: 62 y.o. MRN#: [id number] Attending Physician: Jonah Blue, MD Primary Care Physician: Etta Grandchild, MD Admit Date: 04/26/2023  Reason for Consultation/Follow-up: Pain control  Patient Profile/HPI:  63 y.o. 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 Complains of back pain and knee pain. CT Angio chest negative for PE- demonstrated known liver metastasis and diffuse sclerotic metastasis, multilevel degenerative changes in the spine. Xrays of bilateral knees show diffuse boney metastasis. Palliative consulted for assistance with pain control.    Subjective: Chart reviewed including labs, progress notes, imaging from this and previous encounters.  [name] is feeling much better today. She tells me pain is controlled with current regimen and she feels ready to go home. She tells me she had BM Mon & Tues, none today yet.  She has no questions or concerns; plans to follow up with [facility] w/ palliative outpatient in cancer center.  Review of Systems  Constitutional:  Positive for malaise/fatigue.     Physical Exam Vitals and nursing note reviewed.  Constitutional:      General: She is not in acute distress. Cardiovascular:     Rate and Rhythm: Normal rate.  Pulmonary:     Effort: Pulmonary effort is normal.  Neurological:     Mental Status: She is alert and oriented to person, place, and time.             Vital Signs: BP 133/74 (BP Location: Right Arm)   Pulse (!) 109   Temp 98.3 F (36.8 C) (Oral)   Resp 16   Ht 5\' 8"  (1.727 m)   Wt 72.6 kg   SpO2 100%   BMI 24.33 kg/m   SpO2: SpO2: 100 % O2 Device: O2 Device: Room Air O2 Flow Rate: O2 Flow Rate (L/min): 0 L/min  Intake/output summary:  Intake/Output Summary (Last 24 hours) at 04/28/2023 1033 Last data filed at 04/28/2023 4098 Gross per 24 hour  Intake 1560 ml  Output --  Net 1560 ml   LBM: Last BM Date : 04/26/23 Baseline Weight: Weight: 72.6 kg Most recent weight: Weight: 72.6 kg  Palliative Assessment/Data: PPS: 70%      Patient Active Problem List   Diagnosis Date Noted   Cancer associated pain 04/27/2023   Intractable pain 04/26/2023   Cancer-related breakthrough pain 01/03/2023   Acute bronchitis due to COVID-19 virus 04/28/2021   Clostridium difficile colitis    Acute colitis 12/16/2020   Hypokalemia 12/16/2020   Leukocytopenia 12/16/2020   Class 1 obesity 12/16/2020   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 deficiency 11/02/2020   Breast cancer metastasized to bone, unspecified laterality (HCC) 11/01/2020   Deficiency anemia 10/30/2020   Primary hypertension 10/30/2020   Encounter for general adult medical examination with abnormal findings 10/30/2020   Iron deficiency anemia due to chronic blood loss 10/30/2020   Breast mass, right 10/19/2020   Goals of care, counseling/discussion 10/19/2020   Labyrinthitis 10/18/2020   Lytic bone lesions on xray 10/18/2020    Palliative Care Assessment & Plan    Assessment/Recommendations/Plan  Continue Fentanyl to 169mcg/hr TD q48 hrs Continue PO hydromorphone to 6mg  po q4hr prn for breakthrough pain Continue Senna nightly for constipation prophylaxis Continue dexamethasone 4mg  po daily for anti-inflammatory effect on bone pain Continue acetaminophen 1000mg  TID po for adjuvant effect on opioid Hopeful that pain will be controlled well with this oral/TD  regimen and can be continued outpatient  Code Status: Full code  Prognosis:  Unable to determine  Discharge Planning: Home- sees Mayra Reel, NP as  outpatient  Care plan was discussed with patient  Thank you for allowing the Palliative Medicine Team to assist in the care of this patient.  Gerlean Ren, DNP, AGNP-C Palliative Medicine Team Team Phone # [phone]  Pager # [phone]    Please contact Palliative Medicine Team phone at [phone] for questions and concerns.

## 2023-04-28 NOTE — Progress Notes (Signed)
Annette James is doing better with her pain.  It is much more manageable now.  I do think Palliative Care for help [place] out with her adjustment for pain medication.  She has had increase in the Duragesic patch.  I think she is on gabapentin now.  I think she is also on scheduled Flexeril.  I would like to hope that she should be able to go home today.  She feels like she is able to go home today.  Labs not done yet.  Her appetite is doing okay.  There is no nausea or vomiting.  She is going to the bathroom.  She has had some achiness in her legs.  Her knees feel okay right now.  She has had no cough or shortness of breath.  She has had no nausea or vomiting.  She has had no headache.   Her vital signs show temperature 98.2.  Pulse 103.  Blood pressure 138/79.  Her head and neck exam shows no ocular or oral lesions.  She has no palpable cervical or supraclavicular lymph nodes.  Lungs are clear bilaterally.  She has good air movement bilaterally.  Cardiac exam slightly tachycardic but regular.  She has no murmurs, rubs or bruits.  Abdomen soft.  Bowel sounds are present.  There is no fluid wave.  There is no palpable liver or spleen tip.  Extremity shows no clubbing, cyanosis or edema.  She has little bit of tenderness over the long bones.  There is no swelling.  Neurological exam is nonfocal.    Annette James has metastatic breast cancer.  Her disease has been her bones.  She has had 1 cycle of Abraxane.  She is due to start a second cycle tomorrow.  I would like to hope that she should be able to go home today.  She is on a good oral regimen that is controlling her pain.  I know that everybody up on [address] has done a good job.  Hopefully this is a very short hospital stay for her.   [name], MD  Philemon 1:4

## 2023-04-29 ENCOUNTER — Inpatient Hospital Stay: Payer: BC Managed Care – PPO

## 2023-04-29 ENCOUNTER — Encounter: Payer: Self-pay | Admitting: Hematology & Oncology

## 2023-04-29 ENCOUNTER — Other Ambulatory Visit (HOSPITAL_BASED_OUTPATIENT_CLINIC_OR_DEPARTMENT_OTHER): Payer: Self-pay

## 2023-04-29 ENCOUNTER — Inpatient Hospital Stay: Payer: BC Managed Care – PPO | Admitting: Hematology & Oncology

## 2023-04-29 ENCOUNTER — Other Ambulatory Visit: Payer: Self-pay

## 2023-04-29 VITALS — BP 127/69 | HR 101 | Resp 18

## 2023-04-29 VITALS — BP 154/90 | HR 122 | Temp 98.4°F | Resp 20 | Ht 68.0 in | Wt 161.1 lb

## 2023-04-29 DIAGNOSIS — C50919 Malignant neoplasm of unspecified site of unspecified female breast: Secondary | ICD-10-CM | POA: Diagnosis not present

## 2023-04-29 DIAGNOSIS — C7951 Secondary malignant neoplasm of bone: Secondary | ICD-10-CM

## 2023-04-29 DIAGNOSIS — D538 Other specified nutritional anemias: Secondary | ICD-10-CM | POA: Diagnosis not present

## 2023-04-29 DIAGNOSIS — Z170 Estrogen receptor positive status [ER+]: Secondary | ICD-10-CM | POA: Diagnosis not present

## 2023-04-29 DIAGNOSIS — E519 Thiamine deficiency, unspecified: Secondary | ICD-10-CM

## 2023-04-29 DIAGNOSIS — M898X9 Other specified disorders of bone, unspecified site: Secondary | ICD-10-CM | POA: Diagnosis not present

## 2023-04-29 DIAGNOSIS — Z5111 Encounter for antineoplastic chemotherapy: Secondary | ICD-10-CM | POA: Diagnosis not present

## 2023-04-29 LAB — CBC WITH DIFFERENTIAL (CANCER CENTER ONLY)
Abs Immature Granulocytes: 0.54 10*3/uL — ABNORMAL HIGH (ref 0.00–0.07)
Basophils Absolute: 0 10*3/uL (ref 0.0–0.1)
Basophils Relative: 0 %
Eosinophils Absolute: 0 10*3/uL (ref 0.0–0.5)
Eosinophils Relative: 0 %
HCT: 29.4 % — ABNORMAL LOW (ref 36.0–46.0)
Hemoglobin: 9.3 g/dL — ABNORMAL LOW (ref 12.0–15.0)
Immature Granulocytes: 6 %
Lymphocytes Relative: 4 %
Lymphs Abs: 0.4 10*3/uL — ABNORMAL LOW (ref 0.7–4.0)
MCH: 30.5 pg (ref 26.0–34.0)
MCHC: 31.6 g/dL (ref 30.0–36.0)
MCV: 96.4 fL (ref 80.0–100.0)
Monocytes Absolute: 1.3 10*3/uL — ABNORMAL HIGH (ref 0.1–1.0)
Monocytes Relative: 16 %
Neutro Abs: 6.2 10*3/uL (ref 1.7–7.7)
Neutrophils Relative %: 74 %
Platelet Count: 216 10*3/uL (ref 150–400)
RBC: 3.05 MIL/uL — ABNORMAL LOW (ref 3.87–5.11)
RDW: 19.3 % — ABNORMAL HIGH (ref 11.5–15.5)
Smear Review: NORMAL
WBC Count: 8.5 10*3/uL (ref 4.0–10.5)
nRBC: 0 % (ref 0.0–0.2)

## 2023-04-29 LAB — CMP (CANCER CENTER ONLY)
ALT: 23 U/L (ref 0–44)
AST: 124 U/L — ABNORMAL HIGH (ref 15–41)
Albumin: 3.8 g/dL (ref 3.5–5.0)
Alkaline Phosphatase: 236 U/L — ABNORMAL HIGH (ref 38–126)
Anion gap: 8 (ref 5–15)
BUN: 14 mg/dL (ref 6–20)
CO2: 23 mmol/L (ref 22–32)
Calcium: 8.7 mg/dL — ABNORMAL LOW (ref 8.9–10.3)
Chloride: 103 mmol/L (ref 98–111)
Creatinine: 0.51 mg/dL (ref 0.44–1.00)
GFR, Estimated: >60 mL/min (ref >60)
Glucose, Bld: 128 mg/dL — ABNORMAL HIGH (ref 70–99)
Potassium: 3.7 mmol/L (ref 3.5–5.1)
Sodium: 134 mmol/L — ABNORMAL LOW (ref 135–145)
Total Bilirubin: 0.7 mg/dL (ref 0.3–1.2)
Total Protein: 5.8 g/dL — ABNORMAL LOW (ref 6.5–8.1)

## 2023-04-29 LAB — LACTATE DEHYDROGENASE: LDH: 933 U/L — ABNORMAL HIGH (ref 98–192)

## 2023-04-29 LAB — SAMPLE TO BLOOD BANK

## 2023-04-29 MED ORDER — HYDROMORPHONE HCL 1 MG/ML IJ SOLN
2.0000 mg | Freq: Once | INTRAMUSCULAR | Status: AC
  Administered 2023-04-29: 2 mg via INTRAVENOUS
  Filled 2023-04-29: qty 2

## 2023-04-29 MED ORDER — PACLITAXEL PROTEIN-BOUND CHEMO INJECTION 100 MG
90.0000 mg/m2 | Freq: Once | INTRAVENOUS | Status: AC
  Administered 2023-04-29: 175 mg via INTRAVENOUS
  Filled 2023-04-29: qty 35

## 2023-04-29 MED ORDER — SODIUM CHLORIDE 0.9 % IV SOLN
150.0000 mg | Freq: Once | INTRAVENOUS | Status: AC
  Administered 2023-04-29: 150 mg via INTRAVENOUS
  Filled 2023-04-29: qty 150

## 2023-04-29 MED ORDER — PALONOSETRON HCL INJECTION 0.25 MG/5ML
0.2500 mg | Freq: Once | INTRAVENOUS | Status: AC
  Administered 2023-04-29: 0.25 mg via INTRAVENOUS
  Filled 2023-04-29: qty 5

## 2023-04-29 MED ORDER — PROCHLORPERAZINE MALEATE 10 MG PO TABS
10.0000 mg | ORAL_TABLET | Freq: Once | ORAL | Status: AC
  Administered 2023-04-29: 10 mg via ORAL
  Filled 2023-04-29: qty 1

## 2023-04-29 MED ORDER — HYDROMORPHONE HCL 2 MG PO TABS
6.0000 mg | ORAL_TABLET | ORAL | 0 refills | Status: DC | PRN
  Filled 2023-04-29: qty 180, 10d supply, fill #0

## 2023-04-29 MED ORDER — COLD PACK MISC ONCOLOGY: 1.0000 | Freq: Once | Status: DC | PRN

## 2023-04-29 MED ORDER — SODIUM CHLORIDE 0.9 % IV SOLN
10.0000 mg | Freq: Once | INTRAVENOUS | Status: AC
  Administered 2023-04-29: 10 mg via INTRAVENOUS
  Filled 2023-04-29: qty 10

## 2023-04-29 MED ORDER — SODIUM CHLORIDE 0.9% FLUSH
10.0000 mL | INTRAVENOUS | Status: DC | PRN
  Administered 2023-04-29: 10 mL

## 2023-04-29 MED ORDER — HEPARIN SOD (PORK) LOCK FLUSH 100 UNIT/ML IV SOLN
500.0000 [IU] | Freq: Once | INTRAVENOUS | Status: AC | PRN
  Administered 2023-04-29: 500 [IU]

## 2023-04-29 MED ORDER — SODIUM CHLORIDE 0.9 % IV SOLN: Freq: Once | INTRAVENOUS | Status: AC

## 2023-04-29 NOTE — Progress Notes (Signed)
Hematology and Oncology Follow Up Visit  Annette James [id number] 1961-03-19 62 y.o. 04/29/2023   Principle Diagnosis:  Metastatic breast cancer-bone only metastasis-ER positive/PR positive/HER2 LOW (1+)///  PIK3CA (+) -- progressive   Current Therapy:        Faslodex 500 mg IM monthly-start on 11/06/2020 Ibrance 125 MG PO Q DAY (21 days on/7 days off) - d/c on 08/12/2022 XRT to the RIGHT hip  --completed on 07/24/2021 Zometa 4 mg IV q. 3 months -- next dose on 07/2023 XRT to T12 -- completed on 07/14/2022 Piqray 300 mg po q day -- start on 08/24/2022 -- d/c on 01/03/2023 Adria/Cytoxan -- s/p cycle #2 -- start on 01/19/2023 - d/c on 03/24/2023 Abraxane 90 mg/m2 q week (3/1) -- s/p cycle #1 -- start on 03/31/2023    Interim History:  Annette James is here today for follow-up.  She was just discharged from the hospital yesterday.  She came in on Monday with severe bony pain.  I know she has had flareups of her bony pain.  We made some adjustments.  She is feeling a lot better now.  When she was in the hospital, we did do a CA 27-29.  This is holding relatively steady at 112.  She has had no problems with fever.  There has been no bleeding.  She has had no cough or shortness of breath.  There is been no nausea or vomiting.  We now have her on Abraxane.  I think she is tolerated the first cycle pretty well.  Overall, her performance status is ECOG 1.    Medications:  Allergies as of 04/29/2023   No Known Allergies      Medication List        Accurate as of April 29, 2023 11:13 AM. If you have any questions, ask your nurse or doctor.          acetaminophen 500 MG tablet Commonly known as: TYLENOL Take 2 tablets (1,000 mg total) by mouth 3 (three) times daily.   ascorbic acid 500 MG tablet Commonly known as: VITAMIN C Take 500 mg by mouth daily.   cyclobenzaprine 10 MG tablet Commonly known as: FLEXERIL Take 1 tablet (10 mg total) by mouth 3 (three) times  daily.   dexamethasone 4 MG tablet Commonly known as: DECADRON Take 1 tablet (4 mg total) by mouth daily.   fentaNYL 100 MCG/HR Commonly known as: DURAGESIC Place 1 patch onto the skin every other day.   gabapentin 100 MG capsule Commonly known as: NEURONTIN Take 1 capsule (100 mg total) by mouth 3 (three) times daily as needed (nerve pain).   HYDROmorphone 2 MG tablet Commonly known as: DILAUDID Take 3 tablets (6 mg total) by mouth every 4 (four) hours as needed for severe pain (breakthrough pain).   LORazepam 1 MG tablet Commonly known as: ATIVAN Take 1 tablet (1 mg total) by mouth at bedtime as needed for anxiety or sleep.   ondansetron 8 MG tablet Commonly known as: Zofran Take 1 tablet (8 mg total) by mouth every 8 (eight) hours as needed for nausea or vomiting.   One-A-Day Womens 50 Plus Tabs Take 1 tablet by mouth daily.   prochlorperazine 10 MG tablet Commonly known as: COMPAZINE Take 1 tablet (10 mg total) by mouth every 6 (six) hours as needed for nausea or vomiting.   senna 8.6 MG Tabs tablet Commonly known as: SENOKOT Take 1 tablet (8.6 mg total) by mouth at bedtime.  Allergies: No Known Allergies  Past Medical History, Surgical history, Social history, and Family History were reviewed and updated.  Review of Systems: Review of Systems  Constitutional: Negative.   HENT: Negative.    Eyes: Negative.   Respiratory: Negative.    Cardiovascular: Negative.   Gastrointestinal: Negative.   Genitourinary: Negative.   Musculoskeletal:  Positive for joint pain and neck pain.  Skin: Negative.   Neurological: Negative.   Endo/Heme/Allergies: Negative.   Psychiatric/Behavioral: Negative.        Physical Exam:  Vital signs with temperature 98.9.  Pulse 108.  Blood pressure is 141/86.  Weight is 161 pounds.    Wt Readings from Last 3 Encounters:  04/29/23 161 lb 1.3 oz (73.1 kg)  04/26/23 160 lb (72.6 kg)  03/25/23 165 lb (74.8 kg)     Physical Exam Vitals reviewed.  HENT:     Head: Normocephalic and atraumatic.  Eyes:     Pupils: Pupils are equal, round, and reactive to light.     Comments: She has some ecchymoses under both eyes.  She has good extraocular muscle movement of both eyes.  Pupils react appropriately.  Cardiovascular:     Rate and Rhythm: Normal rate and regular rhythm.     Heart sounds: Normal heart sounds.  Pulmonary:     Effort: Pulmonary effort is normal.     Breath sounds: Normal breath sounds.  Abdominal:     General: Bowel sounds are normal.     Palpations: Abdomen is soft.  Musculoskeletal:        General: No tenderness or deformity. Normal range of motion.     Cervical back: Normal range of motion.  Lymphadenopathy:     Cervical: No cervical adenopathy.  Skin:    General: Skin is warm and dry.     Findings: No erythema or rash.  Neurological:     Mental Status: She is alert and oriented to person, place, and time.  Psychiatric:        Behavior: Behavior normal.        Thought Content: Thought content normal.        Judgment: Judgment normal.      Lab Results  Component Value Date   WBC 8.5 04/29/2023   HGB 9.3 (L) 04/29/2023   HCT 29.4 (L) 04/29/2023   MCV 96.4 04/29/2023   PLT 216 04/29/2023   Lab Results  Component Value Date   FERRITIN 2,963 (H) 01/14/2023   IRON 61 01/14/2023   TIBC 175 (L) 01/14/2023   UIBC 114 (L) 01/14/2023   IRONPCTSAT 35 (H) 01/14/2023   Lab Results  Component Value Date   RETICCTPCT 2.0 01/05/2023   RBC 3.05 (L) 04/29/2023   Lab Results  Component Value Date   KAPLAMBRATIO 4.99 10/20/2020   Lab Results  Component Value Date   IGGSERUM 714 10/19/2020   IGA 153 10/19/2020   IGMSERUM 76 10/19/2020   Lab Results  Component Value Date   TOTALPROTELP 6.1 10/19/2020     Chemistry      Component Value Date/Time   NA 134 (L) 04/29/2023 0945   K 3.7 04/29/2023 0945   CL 103 04/29/2023 0945   CO2 23 04/29/2023 0945   BUN 14  04/29/2023 0945   CREATININE 0.51 04/29/2023 0945      Component Value Date/Time   CALCIUM 8.7 (L) 04/29/2023 0945   ALKPHOS 236 (H) 04/29/2023 0945   AST 124 (H) 04/29/2023 0945   ALT 23 04/29/2023 0945  BILITOT 0.7 04/29/2023 0945       Impression and Plan: Annette James is a very pleasant 62 yo caucasian female with metastatic breast cancer confined at this time to her bones.  She was diagnosed back in December 2021.   We now have her on systemic chemotherapy.  She had been on Adriamycin/Cytoxan.  She seemed to respond to this initially.  However, she then began to progress.  We now have her on Abraxane.  This will be her second cycle of Abraxane.  I am so happy that she is doing a whole lot better with adjustment of her pain medication.  I know that Palliative Care has been doing a wonderful job with her.    As always, we will certainly get her back as needed.  I would like to see her back myself in about 3-4 weeks.     Josph Macho, MD 7/18/202411:13 AM

## 2023-04-29 NOTE — Patient Instructions (Signed)
[facility]  Discharge Instructions: Thank you for choosing [facility] to provide your oncology and hematology care.   If you have a lab appointment with the Cancer Center, please go directly to the Cancer Center and check in at the registration area.  Wear comfortable clothing and clothing appropriate for easy access to any Portacath or PICC line.   We strive to give you quality time with your provider. You may need to reschedule your appointment if you arrive late (15 or more minutes).  Arriving late affects you and other patients whose appointments are after yours.  Also, if you miss three or more appointments without notifying the office, you may be dismissed from the clinic at the provider's discretion.      For prescription refill requests, have your pharmacy contact our office and allow 72 hours for refills to be completed.    Today you received the following chemotherapy and/or immunotherapy agents Abraxane      To help prevent nausea and vomiting after your treatment, we encourage you to take your nausea medication as directed.  BELOW ARE SYMPTOMS THAT SHOULD BE REPORTED IMMEDIATELY: *FEVER GREATER THAN 100.4 F (38 C) OR HIGHER *CHILLS OR SWEATING *NAUSEA AND VOMITING THAT IS NOT CONTROLLED WITH YOUR NAUSEA MEDICATION *UNUSUAL SHORTNESS OF BREATH *UNUSUAL BRUISING OR BLEEDING *URINARY PROBLEMS (pain or burning when urinating, or frequent urination) *BOWEL PROBLEMS (unusual diarrhea, constipation, pain near the anus) TENDERNESS IN MOUTH AND THROAT WITH OR WITHOUT PRESENCE OF ULCERS (sore throat, sores in mouth, or a toothache) UNUSUAL RASH, SWELLING OR PAIN  UNUSUAL VAGINAL DISCHARGE OR ITCHING   Items with * indicate a potential emergency and should be followed up as soon as possible or go to the Emergency Department if any problems should occur.  Please show the CHEMOTHERAPY ALERT CARD or IMMUNOTHERAPY ALERT CARD at check-in  to the Emergency Department and triage nurse. Should you have questions after your visit or need to cancel or reschedule your appointment, please contact [facility] [place]  [phone] and follow the prompts.  Office hours are 8:00 a.m. to 4:30 p.m. Monday - Friday. Please note that voicemails left after 4:00 p.m. may not be returned until the following business day.  We are closed weekends and major holidays. You have access to a nurse at all times for urgent questions. Please call the main number to the clinic [phone] and follow the prompts.  For any non-urgent questions, you may also contact your provider using MyChart. We now offer e-Visits for anyone 50 and older to request care online for non-urgent symptoms. For details visit [email or web address].   Also download the MyChart app! Go to the app store, search "MyChart", open the app, select , and log in with your MyChart username and password.

## 2023-04-29 NOTE — Progress Notes (Signed)
OK to treat with AST value from today per Dr. Myna Hidalgo.

## 2023-04-30 ENCOUNTER — Telehealth: Payer: Self-pay

## 2023-04-30 ENCOUNTER — Encounter: Payer: Self-pay | Admitting: *Deleted

## 2023-04-30 LAB — CANCER ANTIGEN 27.29: CA 27.29: 117.2 U/mL — ABNORMAL HIGH (ref 0.0–38.6)

## 2023-04-30 NOTE — Transitions of Care (Post Inpatient/ED Visit) (Signed)
   04/30/2023  Name: Annette James MRN: [id number] DOB: 1961-06-21  Today's TOC FU Call Status: Today's TOC FU Call Status:: Unsuccessul Call (1st Attempt) Unsuccessful Call (1st Attempt) Date: 04/30/23  Attempted to reach the patient regarding the most recent Inpatient/ED visit.  Follow Up Plan: Additional outreach attempts will be made to reach the patient to complete the Transitions of Care (Post Inpatient/ED visit) call.   Jodelle Gross, RN, BSN, CCM Care Management Coordinator [place] Phone: [phone]/Fax: [phone]

## 2023-05-03 ENCOUNTER — Encounter: Payer: Self-pay | Admitting: *Deleted

## 2023-05-03 ENCOUNTER — Telehealth: Payer: Self-pay | Admitting: *Deleted

## 2023-05-03 NOTE — Transitions of Care (Post Inpatient/ED Visit) (Signed)
   05/03/2023  Name: Dorrene Bently MRN: [id number] DOB: 04-21-1961  Today's TOC FU Call Status: Today's TOC FU Call Status:: Unsuccessful Call (2nd Attempt)  Attempted to reach the patient regarding the most recent Inpatient visit; left HIPAA compliant voice message requesting call back  Follow Up Plan: Additional outreach attempts will be made to reach the patient to complete the Transitions of Care (Post Inpatient visit) call.   Caryl Pina, RN, BSN, CCRN Alumnus RN CM Care Coordination/ Transition of Care- [facility] Care Management [phone]: direct office

## 2023-05-04 ENCOUNTER — Telehealth: Payer: Self-pay | Admitting: *Deleted

## 2023-05-04 ENCOUNTER — Encounter: Payer: Self-pay | Admitting: *Deleted

## 2023-05-04 NOTE — Transitions of Care (Post Inpatient/ED Visit) (Signed)
   05/04/2023  Name: Tiyana Galla MRN: [id number] DOB: 06-03-1961  Today's TOC FU Call Status: Today's TOC FU Call Status:: Unsuccessful Call (3rd Attempt) Unsuccessful Call (3rd Attempt) Date: 05/04/23  Attempted to reach the patient regarding the most recent Inpatient visit; left HIPAA compliant voice message requesting call back  Follow Up Plan: No further outreach attempts will be made at this time. We have been unable to contact the patient.  Caryl Pina, RN, BSN, CCRN Alumnus RN CM Care Coordination/ Transition of Care- [facility] Care Management [phone]: direct office

## 2023-05-06 ENCOUNTER — Encounter: Payer: Self-pay | Admitting: *Deleted

## 2023-05-06 ENCOUNTER — Other Ambulatory Visit: Payer: Self-pay

## 2023-05-06 ENCOUNTER — Inpatient Hospital Stay: Payer: BC Managed Care – PPO

## 2023-05-06 ENCOUNTER — Other Ambulatory Visit (HOSPITAL_BASED_OUTPATIENT_CLINIC_OR_DEPARTMENT_OTHER): Payer: Self-pay

## 2023-05-06 VITALS — BP 136/82 | HR 99 | Temp 98.9°F | Resp 17

## 2023-05-06 DIAGNOSIS — M898X9 Other specified disorders of bone, unspecified site: Secondary | ICD-10-CM | POA: Diagnosis not present

## 2023-05-06 DIAGNOSIS — C50919 Malignant neoplasm of unspecified site of unspecified female breast: Secondary | ICD-10-CM | POA: Diagnosis not present

## 2023-05-06 DIAGNOSIS — Z5111 Encounter for antineoplastic chemotherapy: Secondary | ICD-10-CM | POA: Diagnosis not present

## 2023-05-06 DIAGNOSIS — Z170 Estrogen receptor positive status [ER+]: Secondary | ICD-10-CM | POA: Diagnosis not present

## 2023-05-06 DIAGNOSIS — C7951 Secondary malignant neoplasm of bone: Secondary | ICD-10-CM | POA: Diagnosis not present

## 2023-05-06 LAB — CBC WITH DIFFERENTIAL (CANCER CENTER ONLY)
Abs Immature Granulocytes: 0 10*3/uL (ref 0.00–0.07)
Band Neutrophils: 2 %
Basophils Absolute: 0 10*3/uL (ref 0.0–0.1)
Basophils Relative: 0 %
Blasts: 1 %
Eosinophils Absolute: 0.1 10*3/uL (ref 0.0–0.5)
Eosinophils Relative: 6 %
HCT: 28.9 % — ABNORMAL LOW (ref 36.0–46.0)
Hemoglobin: 9.4 g/dL — ABNORMAL LOW (ref 12.0–15.0)
Lymphocytes Relative: 40 %
Lymphs Abs: 1 10*3/uL (ref 0.7–4.0)
MCH: 30.3 pg (ref 26.0–34.0)
MCHC: 32.5 g/dL (ref 30.0–36.0)
MCV: 93.2 fL (ref 80.0–100.0)
Monocytes Absolute: 0.1 10*3/uL (ref 0.1–1.0)
Monocytes Relative: 4 %
Myelocytes: 1 %
Neutro Abs: 1.2 10*3/uL — ABNORMAL LOW (ref 1.7–7.7)
Neutrophils Relative %: 46 %
Platelet Count: 376 10*3/uL (ref 150–400)
RBC: 3.1 MIL/uL — ABNORMAL LOW (ref 3.87–5.11)
RDW: 18.3 % — ABNORMAL HIGH (ref 11.5–15.5)
Smear Review: NORMAL
WBC Count: 2.4 10*3/uL — ABNORMAL LOW (ref 4.0–10.5)
nRBC: 3 /100 WBC — ABNORMAL HIGH
nRBC: 3.3 % — ABNORMAL HIGH (ref 0.0–0.2)

## 2023-05-06 LAB — CMP (CANCER CENTER ONLY)
ALT: 20 U/L (ref 0–44)
AST: 18 U/L (ref 15–41)
Albumin: 3.8 g/dL (ref 3.5–5.0)
Alkaline Phosphatase: 137 U/L — ABNORMAL HIGH (ref 38–126)
Anion gap: 10 (ref 5–15)
BUN: 11 mg/dL (ref 6–20)
CO2: 26 mmol/L (ref 22–32)
Calcium: 9.1 mg/dL (ref 8.9–10.3)
Chloride: 98 mmol/L (ref 98–111)
Creatinine: 0.51 mg/dL (ref 0.44–1.00)
GFR, Estimated: >60 mL/min (ref >60)
Glucose, Bld: 109 mg/dL — ABNORMAL HIGH (ref 70–99)
Potassium: 3.5 mmol/L (ref 3.5–5.1)
Sodium: 134 mmol/L — ABNORMAL LOW (ref 135–145)
Total Bilirubin: 0.4 mg/dL (ref 0.3–1.2)
Total Protein: 5.8 g/dL — ABNORMAL LOW (ref 6.5–8.1)

## 2023-05-06 MED ORDER — HEPARIN SOD (PORK) LOCK FLUSH 100 UNIT/ML IV SOLN
500.0000 [IU] | Freq: Once | INTRAVENOUS | Status: AC | PRN
  Administered 2023-05-06: 500 [IU]

## 2023-05-06 MED ORDER — FOLIC ACID 1 MG PO TABS: 1.0000 mg | ORAL_TABLET | Freq: Every day | ORAL | 0 refills | Status: DC

## 2023-05-06 MED ORDER — HYDROMORPHONE HCL 4 MG/ML IJ SOLN
4.0000 mg | Freq: Once | INTRAMUSCULAR | Status: AC
  Administered 2023-05-06: 4 mg via INTRAVENOUS
  Filled 2023-05-06: qty 1

## 2023-05-06 MED ORDER — SODIUM CHLORIDE 0.9% FLUSH
10.0000 mL | INTRAVENOUS | Status: DC | PRN
  Administered 2023-05-06: 10 mL

## 2023-05-06 MED ORDER — FOLIC ACID 1 MG PO TABS
1.0000 mg | ORAL_TABLET | Freq: Every day | ORAL | 0 refills | Status: DC
  Filled 2023-05-06: qty 30, 30d supply, fill #0

## 2023-05-06 MED ORDER — SODIUM CHLORIDE 0.9 % IV SOLN: Freq: Once | INTRAVENOUS | Status: AC

## 2023-05-06 NOTE — Progress Notes (Signed)
Dr. Myna Hidalgo notified of WBC-2.4 and ANC-1.2.  Order received from Dr. Myna Hidalgo to hold Abraxane today and to return in one week for labs and treatment. Pharmacy and pt notified.

## 2023-05-06 NOTE — Patient Instructions (Signed)

## 2023-05-10 ENCOUNTER — Encounter: Payer: Self-pay | Admitting: *Deleted

## 2023-05-10 ENCOUNTER — Other Ambulatory Visit: Payer: Self-pay | Admitting: Hematology & Oncology

## 2023-05-11 ENCOUNTER — Other Ambulatory Visit: Payer: Self-pay | Admitting: *Deleted

## 2023-05-11 ENCOUNTER — Other Ambulatory Visit: Payer: Self-pay

## 2023-05-11 ENCOUNTER — Other Ambulatory Visit (HOSPITAL_BASED_OUTPATIENT_CLINIC_OR_DEPARTMENT_OTHER): Payer: Self-pay

## 2023-05-11 MED ORDER — FENTANYL 100 MCG/HR TD PT72: 1.0000 | MEDICATED_PATCH | TRANSDERMAL | 0 refills | Status: DC

## 2023-05-11 MED ORDER — HYDROMORPHONE HCL 2 MG PO TABS
6.0000 mg | ORAL_TABLET | ORAL | 0 refills | Status: DC | PRN
  Filled 2023-05-11: qty 180, 10d supply, fill #0

## 2023-05-12 ENCOUNTER — Other Ambulatory Visit (HOSPITAL_BASED_OUTPATIENT_CLINIC_OR_DEPARTMENT_OTHER): Payer: Self-pay

## 2023-05-12 ENCOUNTER — Other Ambulatory Visit: Payer: Self-pay

## 2023-05-12 MED ORDER — FENTANYL 100 MCG/HR TD PT72
1.0000 | MEDICATED_PATCH | TRANSDERMAL | 0 refills | Status: DC
  Filled 2023-05-12: qty 15, 30d supply, fill #0
  Filled 2023-05-13: qty 10, 20d supply, fill #0
  Filled 2023-05-26: qty 10, 20d supply, fill #1

## 2023-05-13 ENCOUNTER — Telehealth: Payer: Self-pay | Admitting: *Deleted

## 2023-05-13 ENCOUNTER — Inpatient Hospital Stay: Payer: BC Managed Care – PPO | Attending: Hematology & Oncology

## 2023-05-13 ENCOUNTER — Inpatient Hospital Stay: Payer: BC Managed Care – PPO

## 2023-05-13 ENCOUNTER — Other Ambulatory Visit: Payer: Self-pay | Admitting: *Deleted

## 2023-05-13 ENCOUNTER — Encounter: Payer: Self-pay | Admitting: Hematology & Oncology

## 2023-05-13 ENCOUNTER — Other Ambulatory Visit (HOSPITAL_BASED_OUTPATIENT_CLINIC_OR_DEPARTMENT_OTHER): Payer: Self-pay

## 2023-05-13 VITALS — HR 112

## 2023-05-13 DIAGNOSIS — Z170 Estrogen receptor positive status [ER+]: Secondary | ICD-10-CM | POA: Insufficient documentation

## 2023-05-13 DIAGNOSIS — D538 Other specified nutritional anemias: Secondary | ICD-10-CM

## 2023-05-13 DIAGNOSIS — C7951 Secondary malignant neoplasm of bone: Secondary | ICD-10-CM | POA: Diagnosis not present

## 2023-05-13 DIAGNOSIS — Z515 Encounter for palliative care: Secondary | ICD-10-CM

## 2023-05-13 DIAGNOSIS — M898X9 Other specified disorders of bone, unspecified site: Secondary | ICD-10-CM | POA: Diagnosis not present

## 2023-05-13 DIAGNOSIS — C50919 Malignant neoplasm of unspecified site of unspecified female breast: Secondary | ICD-10-CM | POA: Insufficient documentation

## 2023-05-13 DIAGNOSIS — Z5111 Encounter for antineoplastic chemotherapy: Secondary | ICD-10-CM | POA: Insufficient documentation

## 2023-05-13 LAB — CBC WITH DIFFERENTIAL (CANCER CENTER ONLY)
Abs Immature Granulocytes: 2.62 10*3/uL — ABNORMAL HIGH (ref 0.00–0.07)
Basophils Absolute: 0 10*3/uL (ref 0.0–0.1)
Basophils Relative: 0 %
Eosinophils Absolute: 0 10*3/uL (ref 0.0–0.5)
Eosinophils Relative: 0 %
HCT: 27.2 % — ABNORMAL LOW (ref 36.0–46.0)
Hemoglobin: 8.5 g/dL — ABNORMAL LOW (ref 12.0–15.0)
Immature Granulocytes: 30 %
Lymphocytes Relative: 11 %
Lymphs Abs: 1 10*3/uL (ref 0.7–4.0)
MCH: 29.2 pg (ref 26.0–34.0)
MCHC: 31.3 g/dL (ref 30.0–36.0)
MCV: 93.5 fL (ref 80.0–100.0)
Monocytes Absolute: 2 10*3/uL — ABNORMAL HIGH (ref 0.1–1.0)
Monocytes Relative: 23 %
Neutro Abs: 3.1 10*3/uL (ref 1.7–7.7)
Neutrophils Relative %: 36 %
Platelet Count: 304 10*3/uL (ref 150–400)
RBC: 2.91 MIL/uL — ABNORMAL LOW (ref 3.87–5.11)
RDW: 18.9 % — ABNORMAL HIGH (ref 11.5–15.5)
Smear Review: NORMAL
WBC Count: 8.8 10*3/uL (ref 4.0–10.5)
nRBC: 2.8 % — ABNORMAL HIGH (ref 0.0–0.2)

## 2023-05-13 LAB — PREPARE RBC (CROSSMATCH)

## 2023-05-13 LAB — CMP (CANCER CENTER ONLY)
ALT: 40 U/L (ref 0–44)
AST: 144 U/L — ABNORMAL HIGH (ref 15–41)
Albumin: 3.5 g/dL (ref 3.5–5.0)
Alkaline Phosphatase: 255 U/L — ABNORMAL HIGH (ref 38–126)
Anion gap: 9 (ref 5–15)
BUN: 15 mg/dL (ref 6–20)
CO2: 26 mmol/L (ref 22–32)
Calcium: 9.5 mg/dL (ref 8.9–10.3)
Chloride: 98 mmol/L (ref 98–111)
Creatinine: 0.7 mg/dL (ref 0.44–1.00)
GFR, Estimated: >60 mL/min (ref >60)
Glucose, Bld: 122 mg/dL — ABNORMAL HIGH (ref 70–99)
Potassium: 4.3 mmol/L (ref 3.5–5.1)
Sodium: 133 mmol/L — ABNORMAL LOW (ref 135–145)
Total Bilirubin: 0.3 mg/dL (ref 0.3–1.2)
Total Protein: 6.2 g/dL — ABNORMAL LOW (ref 6.5–8.1)

## 2023-05-13 LAB — TYPE AND SCREEN
ABO/RH(D): O POS
Antibody Screen: NEGATIVE

## 2023-05-13 MED ORDER — HYDROMORPHONE HCL 4 MG/ML IJ SOLN
4.0000 mg | Freq: Once | INTRAMUSCULAR | Status: AC
  Administered 2023-05-13: 4 mg via INTRAVENOUS
  Filled 2023-05-13: qty 1

## 2023-05-13 MED ORDER — SODIUM CHLORIDE 0.9 % IV SOLN: Freq: Once | INTRAVENOUS | Status: AC

## 2023-05-13 MED ORDER — SODIUM CHLORIDE 0.9 % IV SOLN
150.0000 mg | Freq: Once | INTRAVENOUS | Status: AC
  Administered 2023-05-13: 150 mg via INTRAVENOUS
  Filled 2023-05-13: qty 150

## 2023-05-13 MED ORDER — SODIUM CHLORIDE 0.9 % IV SOLN
10.0000 mg | Freq: Once | INTRAVENOUS | Status: AC
  Administered 2023-05-13: 10 mg via INTRAVENOUS
  Filled 2023-05-13: qty 10

## 2023-05-13 MED ORDER — HEPARIN SOD (PORK) LOCK FLUSH 100 UNIT/ML IV SOLN
500.0000 [IU] | Freq: Once | INTRAVENOUS | Status: AC | PRN
  Administered 2023-05-13: 500 [IU]

## 2023-05-13 MED ORDER — FULVESTRANT 250 MG/5ML IM SOSY
500.0000 mg | PREFILLED_SYRINGE | Freq: Once | INTRAMUSCULAR | Status: AC
  Administered 2023-05-13: 500 mg via INTRAMUSCULAR
  Filled 2023-05-13: qty 10

## 2023-05-13 MED ORDER — PACLITAXEL PROTEIN-BOUND CHEMO INJECTION 100 MG
90.0000 mg/m2 | Freq: Once | INTRAVENOUS | Status: AC
  Administered 2023-05-13: 175 mg via INTRAVENOUS
  Filled 2023-05-13: qty 35

## 2023-05-13 MED ORDER — PROCHLORPERAZINE MALEATE 10 MG PO TABS
10.0000 mg | ORAL_TABLET | Freq: Once | ORAL | Status: AC
  Administered 2023-05-13: 10 mg via ORAL
  Filled 2023-05-13: qty 1

## 2023-05-13 MED ORDER — SODIUM CHLORIDE 0.9 % IV SOLN: INTRAVENOUS | Status: AC

## 2023-05-13 MED ORDER — SODIUM CHLORIDE 0.9% FLUSH
10.0000 mL | INTRAVENOUS | Status: DC | PRN
  Administered 2023-05-13: 10 mL

## 2023-05-13 MED ORDER — PALONOSETRON HCL INJECTION 0.25 MG/5ML
0.2500 mg | Freq: Once | INTRAVENOUS | Status: AC
  Administered 2023-05-13: 0.25 mg via INTRAVENOUS
  Filled 2023-05-13: qty 5

## 2023-05-13 NOTE — Progress Notes (Signed)
OK to treat with the heart rate  and AST values from today per Dr. Myna Hidalgo.

## 2023-05-13 NOTE — Telephone Encounter (Signed)
Call placed to patient to notify her per order of Dr. Myna Hidalgo that due to the national blood shortage, she will just need one unit of PRBC's tomorrow.  Pt is appreciative of call and has no questions at this time.

## 2023-05-14 ENCOUNTER — Inpatient Hospital Stay: Payer: BC Managed Care – PPO

## 2023-05-14 ENCOUNTER — Other Ambulatory Visit (HOSPITAL_BASED_OUTPATIENT_CLINIC_OR_DEPARTMENT_OTHER): Payer: Self-pay

## 2023-05-14 VITALS — BP 122/80 | HR 107 | Temp 98.8°F | Resp 18

## 2023-05-14 DIAGNOSIS — C50919 Malignant neoplasm of unspecified site of unspecified female breast: Secondary | ICD-10-CM | POA: Diagnosis not present

## 2023-05-14 DIAGNOSIS — C7951 Secondary malignant neoplasm of bone: Secondary | ICD-10-CM

## 2023-05-14 DIAGNOSIS — M898X9 Other specified disorders of bone, unspecified site: Secondary | ICD-10-CM | POA: Diagnosis not present

## 2023-05-14 DIAGNOSIS — G893 Neoplasm related pain (acute) (chronic): Secondary | ICD-10-CM

## 2023-05-14 DIAGNOSIS — Z95828 Presence of other vascular implants and grafts: Secondary | ICD-10-CM

## 2023-05-14 DIAGNOSIS — E519 Thiamine deficiency, unspecified: Secondary | ICD-10-CM

## 2023-05-14 DIAGNOSIS — Z170 Estrogen receptor positive status [ER+]: Secondary | ICD-10-CM | POA: Diagnosis not present

## 2023-05-14 DIAGNOSIS — Z515 Encounter for palliative care: Secondary | ICD-10-CM

## 2023-05-14 DIAGNOSIS — Z5111 Encounter for antineoplastic chemotherapy: Secondary | ICD-10-CM | POA: Diagnosis not present

## 2023-05-14 MED ORDER — HEPARIN SOD (PORK) LOCK FLUSH 100 UNIT/ML IV SOLN: 250.0000 [IU] | INTRAVENOUS | Status: DC | PRN

## 2023-05-14 MED ORDER — SODIUM CHLORIDE 0.9% IV SOLUTION
250.0000 mL | Freq: Once | INTRAVENOUS | Status: AC
  Administered 2023-05-14: 250 mL via INTRAVENOUS

## 2023-05-14 MED ORDER — KETOROLAC TROMETHAMINE 15 MG/ML IJ SOLN
30.0000 mg | Freq: Once | INTRAMUSCULAR | Status: DC
  Filled 2023-05-14: qty 2

## 2023-05-14 MED ORDER — HEPARIN SOD (PORK) LOCK FLUSH 100 UNIT/ML IV SOLN
500.0000 [IU] | Freq: Once | INTRAVENOUS | Status: AC
  Administered 2023-05-14: 500 [IU] via INTRAVENOUS

## 2023-05-14 MED ORDER — HYDROMORPHONE HCL 1 MG/ML IJ SOLN
2.0000 mg | Freq: Once | INTRAMUSCULAR | Status: AC
  Administered 2023-05-14: 2 mg via INTRAVENOUS
  Filled 2023-05-14: qty 2

## 2023-05-14 MED ORDER — SODIUM CHLORIDE 0.9% FLUSH
10.0000 mL | INTRAVENOUS | Status: AC | PRN
  Administered 2023-05-14: 10 mL

## 2023-05-14 NOTE — Patient Instructions (Signed)
Blood Transfusion Information In this this video, you will learn about the benefits and risks of a blood transfusion. To view the content, go to this web address: https://pe.elsevier.[email or web address]  This video will expire on: 04/04/2025. If you need access to this video following this date, please reach out to the healthcare provider who assigned it to you. This information is not intended to replace advice given to you by your health care provider. Make sure you discuss any questions you have with your health care provider. Elsevier Patient Education  2024 [employer].

## 2023-05-15 DIAGNOSIS — G893 Neoplasm related pain (acute) (chronic): Secondary | ICD-10-CM

## 2023-05-15 DIAGNOSIS — R52 Pain, unspecified: Secondary | ICD-10-CM

## 2023-05-15 MED ORDER — CYCLOBENZAPRINE HCL 10 MG PO TABS
10.0000 mg | ORAL_TABLET | Freq: Three times a day (TID) | ORAL | 2 refills | Status: DC
Start: 2023-05-15 — End: 2023-06-03

## 2023-05-20 ENCOUNTER — Inpatient Hospital Stay: Payer: BC Managed Care – PPO

## 2023-05-20 VITALS — BP 133/75 | HR 94 | Temp 98.8°F | Resp 16

## 2023-05-20 DIAGNOSIS — C7951 Secondary malignant neoplasm of bone: Secondary | ICD-10-CM | POA: Diagnosis not present

## 2023-05-20 DIAGNOSIS — M898X9 Other specified disorders of bone, unspecified site: Secondary | ICD-10-CM | POA: Diagnosis not present

## 2023-05-20 DIAGNOSIS — Z170 Estrogen receptor positive status [ER+]: Secondary | ICD-10-CM | POA: Diagnosis not present

## 2023-05-20 DIAGNOSIS — C50919 Malignant neoplasm of unspecified site of unspecified female breast: Secondary | ICD-10-CM | POA: Diagnosis not present

## 2023-05-20 DIAGNOSIS — Z5111 Encounter for antineoplastic chemotherapy: Secondary | ICD-10-CM | POA: Diagnosis not present

## 2023-05-20 LAB — CBC WITH DIFFERENTIAL (CANCER CENTER ONLY)
Abs Immature Granulocytes: 1.41 10*3/uL — ABNORMAL HIGH (ref 0.00–0.07)
Basophils Absolute: 0.1 10*3/uL (ref 0.0–0.1)
Basophils Relative: 0 %
Eosinophils Absolute: 0 10*3/uL (ref 0.0–0.5)
Eosinophils Relative: 0 %
HCT: 30 % — ABNORMAL LOW (ref 36.0–46.0)
Hemoglobin: 9.6 g/dL — ABNORMAL LOW (ref 12.0–15.0)
Immature Granulocytes: 11 %
Lymphocytes Relative: 5 %
Lymphs Abs: 0.7 10*3/uL (ref 0.7–4.0)
MCH: 29.9 pg (ref 26.0–34.0)
MCHC: 32 g/dL (ref 30.0–36.0)
MCV: 93.5 fL (ref 80.0–100.0)
Monocytes Absolute: 0.7 10*3/uL (ref 0.1–1.0)
Monocytes Relative: 5 %
Neutro Abs: 10.3 10*3/uL — ABNORMAL HIGH (ref 1.7–7.7)
Neutrophils Relative %: 79 %
Platelet Count: 248 10*3/uL (ref 150–400)
RBC: 3.21 MIL/uL — ABNORMAL LOW (ref 3.87–5.11)
RDW: 19.1 % — ABNORMAL HIGH (ref 11.5–15.5)
Smear Review: NORMAL
WBC Count: 13.2 10*3/uL — ABNORMAL HIGH (ref 4.0–10.5)
nRBC: 1 % — ABNORMAL HIGH (ref 0.0–0.2)

## 2023-05-20 LAB — CMP (CANCER CENTER ONLY)
ALT: 23 U/L (ref 0–44)
AST: 16 U/L (ref 15–41)
Albumin: 3 g/dL — ABNORMAL LOW (ref 3.5–5.0)
Alkaline Phosphatase: 149 U/L — ABNORMAL HIGH (ref 38–126)
Anion gap: 7 (ref 5–15)
BUN: 17 mg/dL (ref 6–20)
CO2: 27 mmol/L (ref 22–32)
Calcium: 8.2 mg/dL — ABNORMAL LOW (ref 8.9–10.3)
Chloride: 102 mmol/L (ref 98–111)
Creatinine: 0.49 mg/dL (ref 0.44–1.00)
GFR, Estimated: >60 mL/min (ref >60)
Glucose, Bld: 89 mg/dL (ref 70–99)
Potassium: 4.1 mmol/L (ref 3.5–5.1)
Sodium: 136 mmol/L (ref 135–145)
Total Bilirubin: 0.2 mg/dL — ABNORMAL LOW (ref 0.3–1.2)
Total Protein: 5.3 g/dL — ABNORMAL LOW (ref 6.5–8.1)

## 2023-05-20 MED ORDER — SODIUM CHLORIDE 0.9 % IV SOLN
150.0000 mg | Freq: Once | INTRAVENOUS | Status: AC
  Administered 2023-05-20: 150 mg via INTRAVENOUS
  Filled 2023-05-20: qty 150

## 2023-05-20 MED ORDER — PALONOSETRON HCL INJECTION 0.25 MG/5ML
0.2500 mg | Freq: Once | INTRAVENOUS | Status: AC
  Administered 2023-05-20: 0.25 mg via INTRAVENOUS
  Filled 2023-05-20: qty 5

## 2023-05-20 MED ORDER — KETOROLAC TROMETHAMINE 15 MG/ML IJ SOLN
30.0000 mg | Freq: Once | INTRAMUSCULAR | Status: AC
  Administered 2023-05-20: 30 mg via INTRAVENOUS
  Filled 2023-05-20: qty 2

## 2023-05-20 MED ORDER — PACLITAXEL PROTEIN-BOUND CHEMO INJECTION 100 MG
90.0000 mg/m2 | Freq: Once | INTRAVENOUS | Status: AC
  Administered 2023-05-20: 175 mg via INTRAVENOUS
  Filled 2023-05-20: qty 35

## 2023-05-20 MED ORDER — PROCHLORPERAZINE MALEATE 10 MG PO TABS
10.0000 mg | ORAL_TABLET | Freq: Once | ORAL | Status: AC
  Administered 2023-05-20: 10 mg via ORAL
  Filled 2023-05-20: qty 1

## 2023-05-20 MED ORDER — HEPARIN SOD (PORK) LOCK FLUSH 100 UNIT/ML IV SOLN
500.0000 [IU] | Freq: Once | INTRAVENOUS | Status: AC | PRN
  Administered 2023-05-20: 500 [IU]

## 2023-05-20 MED ORDER — SODIUM CHLORIDE 0.9 % IV SOLN: Freq: Once | INTRAVENOUS | Status: AC

## 2023-05-20 MED ORDER — SODIUM CHLORIDE 0.9 % IV SOLN
10.0000 mg | Freq: Once | INTRAVENOUS | Status: AC
  Administered 2023-05-20: 10 mg via INTRAVENOUS
  Filled 2023-05-20: qty 10

## 2023-05-20 MED ORDER — SODIUM CHLORIDE 0.9% FLUSH
10.0000 mL | INTRAVENOUS | Status: DC | PRN
  Administered 2023-05-20: 10 mL

## 2023-05-20 MED ORDER — HYDROMORPHONE HCL 4 MG/ML IJ SOLN
4.0000 mg | Freq: Once | INTRAMUSCULAR | Status: AC
  Administered 2023-05-20: 4 mg via INTRAVENOUS
  Filled 2023-05-20: qty 1

## 2023-05-20 NOTE — Patient Instructions (Signed)
[facility]  Discharge Instructions: Thank you for choosing [facility] to provide your oncology and hematology care.   If you have a lab appointment with the Cancer Center, please go directly to the Cancer Center and check in at the registration area.  Wear comfortable clothing and clothing appropriate for easy access to any Portacath or PICC line.   We strive to give you quality time with your provider. You may need to reschedule your appointment if you arrive late (15 or more minutes).  Arriving late affects you and other patients whose appointments are after yours.  Also, if you miss three or more appointments without notifying the office, you may be dismissed from the clinic at the provider's discretion.      For prescription refill requests, have your pharmacy contact our office and allow 72 hours for refills to be completed.    Today you received the following chemotherapy and/or immunotherapy agents Abraxane      To help prevent nausea and vomiting after your treatment, we encourage you to take your nausea medication as directed.  BELOW ARE SYMPTOMS THAT SHOULD BE REPORTED IMMEDIATELY: *FEVER GREATER THAN 100.4 F (38 C) OR HIGHER *CHILLS OR SWEATING *NAUSEA AND VOMITING THAT IS NOT CONTROLLED WITH YOUR NAUSEA MEDICATION *UNUSUAL SHORTNESS OF BREATH *UNUSUAL BRUISING OR BLEEDING *URINARY PROBLEMS (pain or burning when urinating, or frequent urination) *BOWEL PROBLEMS (unusual diarrhea, constipation, pain near the anus) TENDERNESS IN MOUTH AND THROAT WITH OR WITHOUT PRESENCE OF ULCERS (sore throat, sores in mouth, or a toothache) UNUSUAL RASH, SWELLING OR PAIN  UNUSUAL VAGINAL DISCHARGE OR ITCHING   Items with * indicate a potential emergency and should be followed up as soon as possible or go to the Emergency Department if any problems should occur.  Please show the CHEMOTHERAPY ALERT CARD or IMMUNOTHERAPY ALERT CARD at check-in  to the Emergency Department and triage nurse. Should you have questions after your visit or need to cancel or reschedule your appointment, please contact [facility] [place]  [phone] and follow the prompts.  Office hours are 8:00 a.m. to 4:30 p.m. Monday - Friday. Please note that voicemails left after 4:00 p.m. may not be returned until the following business day.  We are closed weekends and major holidays. You have access to a nurse at all times for urgent questions. Please call the main number to the clinic [phone] and follow the prompts.  For any non-urgent questions, you may also contact your provider using MyChart. We now offer e-Visits for anyone 50 and older to request care online for non-urgent symptoms. For details visit [email or web address].   Also download the MyChart app! Go to the app store, search "MyChart", open the app, select , and log in with your MyChart username and password.

## 2023-05-26 ENCOUNTER — Other Ambulatory Visit: Payer: Self-pay | Admitting: Hematology & Oncology

## 2023-05-26 ENCOUNTER — Other Ambulatory Visit (HOSPITAL_BASED_OUTPATIENT_CLINIC_OR_DEPARTMENT_OTHER): Payer: Self-pay

## 2023-05-26 DIAGNOSIS — C50919 Malignant neoplasm of unspecified site of unspecified female breast: Secondary | ICD-10-CM

## 2023-05-26 MED ORDER — PROCHLORPERAZINE MALEATE 10 MG PO TABS
10.0000 mg | ORAL_TABLET | Freq: Four times a day (QID) | ORAL | 0 refills | Status: DC | PRN
Start: 2023-05-26 — End: 2023-06-03
  Filled 2023-05-26: qty 30, 8d supply, fill #0

## 2023-05-26 MED ORDER — HYDROMORPHONE HCL 2 MG PO TABS
6.0000 mg | ORAL_TABLET | ORAL | 0 refills | Status: DC | PRN
  Filled 2023-05-26: qty 180, 10d supply, fill #0

## 2023-05-26 MED ORDER — FOLIC ACID 1 MG PO TABS
1.0000 mg | ORAL_TABLET | Freq: Every day | ORAL | 0 refills | Status: DC
  Filled 2023-05-26 – 2023-05-31 (×2): qty 30, 30d supply, fill #0

## 2023-05-27 ENCOUNTER — Encounter: Payer: Self-pay | Admitting: *Deleted

## 2023-05-27 ENCOUNTER — Inpatient Hospital Stay: Payer: BC Managed Care – PPO

## 2023-05-27 ENCOUNTER — Encounter: Payer: Self-pay | Admitting: Hematology & Oncology

## 2023-05-27 ENCOUNTER — Other Ambulatory Visit: Payer: Self-pay

## 2023-05-27 ENCOUNTER — Inpatient Hospital Stay (HOSPITAL_BASED_OUTPATIENT_CLINIC_OR_DEPARTMENT_OTHER): Payer: BC Managed Care – PPO | Admitting: Hematology & Oncology

## 2023-05-27 ENCOUNTER — Other Ambulatory Visit (HOSPITAL_BASED_OUTPATIENT_CLINIC_OR_DEPARTMENT_OTHER): Payer: Self-pay

## 2023-05-27 VITALS — BP 120/68 | HR 91 | Resp 18

## 2023-05-27 DIAGNOSIS — M898X9 Other specified disorders of bone, unspecified site: Secondary | ICD-10-CM | POA: Diagnosis not present

## 2023-05-27 DIAGNOSIS — C7951 Secondary malignant neoplasm of bone: Secondary | ICD-10-CM | POA: Diagnosis not present

## 2023-05-27 DIAGNOSIS — C50919 Malignant neoplasm of unspecified site of unspecified female breast: Secondary | ICD-10-CM

## 2023-05-27 DIAGNOSIS — Z5111 Encounter for antineoplastic chemotherapy: Secondary | ICD-10-CM | POA: Diagnosis not present

## 2023-05-27 DIAGNOSIS — Z170 Estrogen receptor positive status [ER+]: Secondary | ICD-10-CM | POA: Diagnosis not present

## 2023-05-27 DIAGNOSIS — D538 Other specified nutritional anemias: Secondary | ICD-10-CM

## 2023-05-27 LAB — CBC WITH DIFFERENTIAL (CANCER CENTER ONLY)
Abs Immature Granulocytes: 0.25 10*3/uL — ABNORMAL HIGH (ref 0.00–0.07)
Basophils Absolute: 0 10*3/uL (ref 0.0–0.1)
Basophils Relative: 1 %
Eosinophils Absolute: 0.1 10*3/uL (ref 0.0–0.5)
Eosinophils Relative: 3 %
HCT: 32.4 % — ABNORMAL LOW (ref 36.0–46.0)
Hemoglobin: 10.3 g/dL — ABNORMAL LOW (ref 12.0–15.0)
Immature Granulocytes: 5 %
Lymphocytes Relative: 12 %
Lymphs Abs: 0.6 10*3/uL — ABNORMAL LOW (ref 0.7–4.0)
MCH: 30.4 pg (ref 26.0–34.0)
MCHC: 31.8 g/dL (ref 30.0–36.0)
MCV: 95.6 fL (ref 80.0–100.0)
Monocytes Absolute: 0.6 10*3/uL (ref 0.1–1.0)
Monocytes Relative: 13 %
Neutro Abs: 3.2 10*3/uL (ref 1.7–7.7)
Neutrophils Relative %: 66 %
Platelet Count: 268 10*3/uL (ref 150–400)
RBC: 3.39 MIL/uL — ABNORMAL LOW (ref 3.87–5.11)
RDW: 20.3 % — ABNORMAL HIGH (ref 11.5–15.5)
WBC Count: 4.8 10*3/uL (ref 4.0–10.5)
nRBC: 1.5 % — ABNORMAL HIGH (ref 0.0–0.2)

## 2023-05-27 LAB — CMP (CANCER CENTER ONLY)
ALT: 20 U/L (ref 0–44)
AST: 17 U/L (ref 15–41)
Albumin: 3.6 g/dL (ref 3.5–5.0)
Alkaline Phosphatase: 124 U/L (ref 38–126)
Anion gap: 8 (ref 5–15)
BUN: 20 mg/dL (ref 6–20)
CO2: 26 mmol/L (ref 22–32)
Calcium: 8.5 mg/dL — ABNORMAL LOW (ref 8.9–10.3)
Chloride: 103 mmol/L (ref 98–111)
Creatinine: 0.57 mg/dL (ref 0.44–1.00)
GFR, Estimated: >60 mL/min (ref >60)
Glucose, Bld: 127 mg/dL — ABNORMAL HIGH (ref 70–99)
Potassium: 3.6 mmol/L (ref 3.5–5.1)
Sodium: 137 mmol/L (ref 135–145)
Total Bilirubin: 0.3 mg/dL (ref 0.3–1.2)
Total Protein: 5.7 g/dL — ABNORMAL LOW (ref 6.5–8.1)

## 2023-05-27 LAB — RETICULOCYTES
Immature Retic Fract: 39.2 % — ABNORMAL HIGH (ref 2.3–15.9)
RBC.: 3.37 MIL/uL — ABNORMAL LOW (ref 3.87–5.11)
Retic Count, Absolute: 101.8 10*3/uL (ref 19.0–186.0)
Retic Ct Pct: 3 % (ref 0.4–3.1)

## 2023-05-27 LAB — SAMPLE TO BLOOD BANK

## 2023-05-27 LAB — IRON AND IRON BINDING CAPACITY (CC-WL,HP ONLY)
Iron: 54 ug/dL (ref 28–170)
Saturation Ratios: 18 % (ref 10.4–31.8)
TIBC: 309 ug/dL (ref 250–450)
UIBC: 255 ug/dL (ref 148–442)

## 2023-05-27 LAB — FERRITIN: Ferritin: 1007 ng/mL — ABNORMAL HIGH (ref 11–307)

## 2023-05-27 MED ORDER — MORPHINE SULFATE (PF) 4 MG/ML IV SOLN: 4.0000 mg | INTRAVENOUS | Status: DC | PRN

## 2023-05-27 MED ORDER — HEPARIN SOD (PORK) LOCK FLUSH 100 UNIT/ML IV SOLN
500.0000 [IU] | Freq: Once | INTRAVENOUS | Status: AC
  Administered 2023-05-27: 500 [IU] via INTRAVENOUS

## 2023-05-27 MED ORDER — KETOROLAC TROMETHAMINE 15 MG/ML IJ SOLN: 30.0000 mg | Freq: Once | INTRAMUSCULAR | Status: DC

## 2023-05-27 MED ORDER — SODIUM CHLORIDE 0.9 % IV SOLN: INTRAVENOUS | Status: DC

## 2023-05-27 MED ORDER — SODIUM CHLORIDE 0.9% FLUSH
10.0000 mL | Freq: Once | INTRAVENOUS | Status: AC
  Administered 2023-05-27: 10 mL via INTRAVENOUS

## 2023-05-27 MED ORDER — SODIUM CHLORIDE 0.9 % IV SOLN: INTRAVENOUS | Status: AC

## 2023-05-27 MED ORDER — MORPHINE SULFATE (PF) 4 MG/ML IV SOLN
4.0000 mg | Freq: Once | INTRAVENOUS | Status: AC
  Administered 2023-05-27: 4 mg via INTRAVENOUS
  Filled 2023-05-27: qty 1

## 2023-05-27 MED ORDER — MORPHINE SULFATE 15 MG PO TABS
15.0000 mg | ORAL_TABLET | ORAL | 0 refills | Status: DC | PRN
Start: 2023-05-27 — End: 2023-06-03
  Filled 2023-05-27 (×2): qty 120, 20d supply, fill #0

## 2023-05-27 MED ORDER — KETOROLAC TROMETHAMINE 15 MG/ML IJ SOLN
30.0000 mg | Freq: Once | INTRAMUSCULAR | Status: AC
  Administered 2023-05-27: 30 mg via INTRAVENOUS
  Filled 2023-05-27: qty 2

## 2023-05-27 MED ORDER — GABAPENTIN 300 MG PO CAPS
300.0000 mg | ORAL_CAPSULE | Freq: Four times a day (QID) | ORAL | 6 refills | Status: DC
  Filled 2023-05-27 (×2): qty 120, 30d supply, fill #0

## 2023-05-27 NOTE — Patient Instructions (Signed)

## 2023-05-27 NOTE — Progress Notes (Signed)
Hematology and Oncology Follow Up Visit  Annette James [id number] 1961-04-14 62 y.o. 05/27/2023   Principle Diagnosis:  Metastatic breast cancer-bone only metastasis-ER positive/PR positive/HER2 LOW (1+)///  PIK3CA (+) -- progressive   Current Therapy:        Faslodex 500 mg IM monthly-start on 11/06/2020 Ibrance 125 MG PO Q DAY (21 days on/7 days off) - d/c on 08/12/2022 XRT to the RIGHT hip  --completed on 07/24/2021 Zometa 4 mg IV q. 3 months -- next dose on 07/2023 XRT to T12 -- completed on 07/14/2022 Piqray 300 mg po q day -- start on 08/24/2022 -- d/c on 01/03/2023 Adria/Cytoxan -- s/p cycle #2 -- start on 01/19/2023 - d/c on 03/24/2023 Abraxane 90 mg/m2 q week (3/1) -- s/p cycle #2 -- start on 03/31/2023    Interim History:  Annette James is here today for follow-up.  Annette James has of some problems with pain.  There is in her hips.  We may have to make some adjustments.  Annette James has been on hydromorphone.  I think this might not be all that effective right now.  I will switch her over to MSIR (15 mg p.o. every 4 hours as needed) let see if this may help with some of the pain.  In the office today, Annette James we will get some IV fluid.  I will give her some Toradol and some IV morphine.  Her last CA 27.29 was holding steady.  Noted that her alkaline phosphatase has been coming down.  Hopefully, this is a good sign that things are working.  Also noted that her hemoglobin was slowly trending upward.  Annette James has a decent appetite.  Annette James has had no nausea or vomiting.  There has been no mouth sores.  Annette James has had no issues with bowels or bladder.  Annette James has not noted any leg swelling.  Currently, I would say that her performance status is probably ECOG 1.   Medications:  Allergies as of 05/27/2023   No Known Allergies      Medication List        Accurate as of May 27, 2023  9:12 AM. If you have any questions, ask your nurse or doctor.          acetaminophen 500 MG  tablet Commonly known as: TYLENOL Take 2 tablets (1,000 mg total) by mouth 3 (three) times daily.   ascorbic acid 500 MG tablet Commonly known as: VITAMIN C Take 500 mg by mouth daily.   cyclobenzaprine 10 MG tablet Commonly known as: FLEXERIL Take 1 tablet (10 mg total) by mouth 3 (three) times daily.   dexamethasone 4 MG tablet Commonly known as: DECADRON Take 1 tablet (4 mg total) by mouth daily.   fentaNYL 100 MCG/HR Commonly known as: DURAGESIC Place 1 patch onto the skin every other day.   folic acid 1 MG tablet Commonly known as: FOLVITE Take 1 tablet (1 mg total) by mouth daily.   gabapentin 100 MG capsule Commonly known as: NEURONTIN Take 1 capsule (100 mg total) by mouth 3 (three) times daily as needed (nerve pain).   HYDROmorphone 2 MG tablet Commonly known as: DILAUDID Take 3 tablets (6 mg total) by mouth every 4 (four) hours as needed for severe pain (breakthrough pain).   LORazepam 1 MG tablet Commonly known as: ATIVAN Take 1 tablet (1 mg total) by mouth at bedtime as needed for anxiety or sleep.   ondansetron 8 MG tablet Commonly known as: Zofran Take 1 tablet (8 mg total)  by mouth every 8 (eight) hours as needed for nausea or vomiting.   One-A-Day Womens 50 Plus Tabs Take 1 tablet by mouth daily.   prochlorperazine 10 MG tablet Commonly known as: COMPAZINE Take 1 tablet (10 mg total) by mouth every 6 (six) hours as needed for nausea or vomiting.   senna 8.6 MG Tabs tablet Commonly known as: SENOKOT Take 1 tablet (8.6 mg total) by mouth at bedtime.   VITAMIN B 12 PO Take 5,000 mcg by mouth daily.        Allergies: No Known Allergies  Past Medical History, Surgical history, Social history, and Family History were reviewed and updated.  Review of Systems: Review of Systems  Constitutional: Negative.   HENT: Negative.    Eyes: Negative.   Respiratory: Negative.    Cardiovascular: Negative.   Gastrointestinal: Negative.   Genitourinary:  Negative.   Musculoskeletal:  Positive for joint pain and neck pain.  Skin: Negative.   Neurological: Negative.   Endo/Heme/Allergies: Negative.   Psychiatric/Behavioral: Negative.        Physical Exam:  Vital signs with temperature 98.9.  Pulse 108.  Blood pressure is 141/86.  Weight is 161 pounds.    Wt Readings from Last 3 Encounters:  05/27/23 166 lb (75.3 kg)  04/29/23 161 lb 1.3 oz (73.1 kg)  04/26/23 160 lb (72.6 kg)    Physical Exam Vitals reviewed.  HENT:     Head: Normocephalic and atraumatic.  Eyes:     Pupils: Pupils are equal, round, and reactive to light.     Comments: Annette James has some ecchymoses under both eyes.  Annette James has good extraocular muscle movement of both eyes.  Pupils react appropriately.  Cardiovascular:     Rate and Rhythm: Normal rate and regular rhythm.     Heart sounds: Normal heart sounds.  Pulmonary:     Effort: Pulmonary effort is normal.     Breath sounds: Normal breath sounds.  Abdominal:     General: Bowel sounds are normal.     Palpations: Abdomen is soft.  Musculoskeletal:        General: No tenderness or deformity. Normal range of motion.     Cervical back: Normal range of motion.  Lymphadenopathy:     Cervical: No cervical adenopathy.  Skin:    General: Skin is warm and dry.     Findings: No erythema or rash.  Neurological:     Mental Status: Annette James is alert and oriented to person, place, and time.  Psychiatric:        Behavior: Behavior normal.        Thought Content: Thought content normal.        Judgment: Judgment normal.      Lab Results  Component Value Date   WBC 4.8 05/27/2023   HGB 10.3 (L) 05/27/2023   HCT 32.4 (L) 05/27/2023   MCV 95.6 05/27/2023   PLT 268 05/27/2023   Lab Results  Component Value Date   FERRITIN 2,963 (H) 01/14/2023   IRON 61 01/14/2023   TIBC 175 (L) 01/14/2023   UIBC 114 (L) 01/14/2023   IRONPCTSAT 35 (H) 01/14/2023   Lab Results  Component Value Date   RETICCTPCT 3.0 05/27/2023   RBC  3.37 (L) 05/27/2023   RBC 3.39 (L) 05/27/2023   Lab Results  Component Value Date   KAPLAMBRATIO 4.99 10/20/2020   Lab Results  Component Value Date   IGGSERUM 714 10/19/2020   IGA 153 10/19/2020   IGMSERUM 76 10/19/2020  Lab Results  Component Value Date   TOTALPROTELP 6.1 10/19/2020     Chemistry      Component Value Date/Time   NA 136 05/20/2023 0825   K 4.1 05/20/2023 0825   CL 102 05/20/2023 0825   CO2 27 05/20/2023 0825   BUN 17 05/20/2023 0825   CREATININE 0.49 05/20/2023 0825      Component Value Date/Time   CALCIUM 8.2 (L) 05/20/2023 0825   ALKPHOS 149 (H) 05/20/2023 0825   AST 16 05/20/2023 0825   ALT 23 05/20/2023 0825   BILITOT 0.2 (L) 05/20/2023 0825       Impression and Plan: Annette James is a very pleasant 62 yo caucasian female with metastatic breast cancer confined at this time to her bones.  Annette James was diagnosed back in December 2021.   We now have her on systemic chemotherapy.  Annette James had been on Adriamycin/Cytoxan.  Annette James seemed to respond to this initially.  However, Annette James then began to progress.  We now have her on Abraxane.    I will have to see what her CA 27-29 looks like.  I just want to make sure that we focus much we can on her quality of life and pain.  I know that bony pain can be quite difficult to treat the patient with metastatic cancer.  Hopefully, the switch from hydromorphone to morphine will help her.  Annette James will come back in I think a week or so to start her third cycle of therapy.  This will be her second cycle of Abraxane.    Josph Macho, MD 8/15/20249:12 AM

## 2023-05-27 NOTE — Patient Instructions (Signed)
Dehydration, Adult Dehydration is a condition in which there is not enough water or other fluids in the body. This happens when a person loses more fluids than they take in. Important organs cannot work right without the right amount of fluids. Any loss of fluids from the body can cause dehydration. Dehydration can be mild, worse, or very bad. It should be treated right away to keep it from getting very bad. What are the causes? Conditions that cause loss of water in the body. They include: Watery poop (diarrhea). Vomiting. Sweating a lot. Fever. Infection. Peeing (urinating) a lot. Not drinking enough fluids. Certain medicines, such as medicines that take extra fluid out of the body (diuretics). Lack of safe drinking water. Not being able to get enough water and food. What increases the risk? Having a long-term (chronic) illness that has not been treated the right way, such as: Diabetes. Heart disease. Kidney disease. Being 65 years of age or older. Having a disability. Living in a place that is high above the ground or sea (high in altitude). The thinner, drier air causes more fluid loss. Doing exercises that put stress on your body for a long time. Being active when in hot places. What are the signs or symptoms? Symptoms of dehydration depend on how bad it is. Mild or worse dehydration Thirst. Dry lips or dry mouth. Feeling dizzy or light-headed. Muscle cramps. Passing little pee or dark pee. Pee may be the color of tea. Headache. Very bad dehydration Changes in skin. Skin may: Be cold to the touch (clammy). Be blotchy or pale. Not go back to normal right after you pinch it and let it go. Little or no tears, pee, or sweat. Fast breathing. Low blood pressure. Weak pulse. Pulse that is more than 100 beats a minute when you are sitting still. Other changes, such as: Feeling very thirsty. Eyes that look hollow (sunken). Cold hands and feet. Being confused. Being very  tired (lethargic) or having trouble waking from sleep. Losing weight. Loss of consciousness. How is this treated? Treatment for this condition depends on how bad your dehydration is. Treatment should start right away. Do not wait until your condition gets very bad. Very bad dehydration is an emergency. You will need to go to a hospital. Mild or worse dehydration can be treated at home. You may be asked to: Drink more fluids. Drink an oral rehydration solution (ORS). This drink gives you the right amount of fluids, salts, and minerals (electrolytes). Very bad dehydration can be treated: With fluids through an IV tube. By correcting low levels of electrolytes in the body. By treating the problem that caused your dehydration. Follow these instructions at home: Oral rehydration solution If told by your doctor, drink an ORS: Make an ORS. Use instructions on the package. Start by drinking small amounts, about  cup (120 mL) every 5-10 minutes. Slowly drink more until you have had the amount that your doctor said to have.  Eating and drinking  Drink enough clear fluid to keep your pee pale yellow. If you were told to drink an ORS, finish the ORS first. Then, start slowly drinking other clear fluids. Drink fluids such as: Water. Do not drink only water. Doing that can make the salt (sodium) level in your body get too low. Water from ice chips you suck on. Fruit juice that you have added water to (diluted). Low-calorie sports drinks. Eat foods that have the right amounts of salts and minerals, such as bananas, oranges, potatoes,   tomatoes, or spinach. Do not drink alcohol. Avoid drinks that have caffeine or sugar. These include:: High-calorie sports drinks. Fruit juice that you did not add water to. Soda. Coffee or energy drinks. Avoid foods that are greasy or have a lot of fat or sugar. General instructions Take over-the-counter and prescription medicines only as told by your doctor. Do  not take sodium tablets. Doing that can make the salt level in your body get too high. Return to your normal activities as told by your doctor. Ask your doctor what activities are safe for you. Keep all follow-up visits. Your doctor may check and change your treatment. Contact a doctor if: You have pain in your belly (abdomen) and the pain: Gets worse. Stays in one place. You have a rash. You have a stiff neck. You get angry or annoyed more easily than normal. You are more tired or have a harder time waking than normal. You feel weak or dizzy. You feel very thirsty. Get help right away if: You have any symptoms of very bad dehydration. You vomit every time you eat or drink. Your vomiting gets worse, does not go away, or you vomit blood or green stuff. You are getting treatment, but symptoms are getting worse. You have a fever. You have a very bad headache. You have: Diarrhea that gets worse or does not go away. Blood in your poop (stool). This may cause poop to look black and tarry. No pee in 6-8 hours. Only a small amount of pee in 6-8 hours, and the pee is very dark. You have trouble breathing. These symptoms may be an emergency. Get help right away. Call 911. Do not wait to see if the symptoms will go away. Do not drive yourself to the hospital. This information is not intended to replace advice given to you by your health care provider. Make sure you discuss any questions you have with your health care provider. Document Revised: 04/27/2022 Document Reviewed: 04/27/2022 Elsevier Patient Education  2024 Elsevier Inc.  

## 2023-05-28 ENCOUNTER — Telehealth: Payer: Self-pay

## 2023-05-28 LAB — CANCER ANTIGEN 27.29: CA 27.29: 109.6 U/mL — ABNORMAL HIGH (ref 0.0–38.6)

## 2023-05-28 LAB — ERYTHROPOIETIN: Erythropoietin: 39.7 m[IU]/mL — ABNORMAL HIGH (ref 2.6–18.5)

## 2023-05-31 ENCOUNTER — Other Ambulatory Visit (HOSPITAL_BASED_OUTPATIENT_CLINIC_OR_DEPARTMENT_OTHER): Payer: Self-pay

## 2023-06-03 ENCOUNTER — Ambulatory Visit: Payer: BC Managed Care – PPO

## 2023-06-03 ENCOUNTER — Inpatient Hospital Stay: Payer: BC Managed Care – PPO

## 2023-06-03 ENCOUNTER — Other Ambulatory Visit (HOSPITAL_BASED_OUTPATIENT_CLINIC_OR_DEPARTMENT_OTHER): Payer: Self-pay

## 2023-06-03 ENCOUNTER — Other Ambulatory Visit: Payer: BC Managed Care – PPO

## 2023-06-04 ENCOUNTER — Other Ambulatory Visit (HOSPITAL_COMMUNITY): Payer: Self-pay

## 2023-06-10 ENCOUNTER — Inpatient Hospital Stay: Payer: BC Managed Care – PPO

## 2023-06-10 ENCOUNTER — Other Ambulatory Visit: Payer: BC Managed Care – PPO

## 2023-06-10 ENCOUNTER — Ambulatory Visit: Payer: BC Managed Care – PPO

## 2023-06-13 NOTE — Telephone Encounter (Signed)
-----   Message from Josph Macho sent at 06/02/2023  2:31 PM EDT ----- Please call and let her know that the tumor marker is slightly better.  We will take this.Marland Kitchen

## 2023-06-13 NOTE — Telephone Encounter (Signed)
Advised via MyChart.

## 2023-06-13 DEATH — deceased

## 2023-06-17 ENCOUNTER — Other Ambulatory Visit: Payer: BC Managed Care – PPO

## 2023-06-17 ENCOUNTER — Inpatient Hospital Stay: Payer: BC Managed Care – PPO

## 2023-06-17 ENCOUNTER — Ambulatory Visit: Payer: BC Managed Care – PPO | Admitting: Hematology & Oncology

## 2023-06-17 ENCOUNTER — Ambulatory Visit: Payer: BC Managed Care – PPO
